# Patient Record
Sex: Female | Born: 1940 | Race: White | Hispanic: No | Smoking: Former smoker
Health system: Southern US, Community
[De-identification: ages and names within clinical notes are randomized; demographics above are authoritative.]

## PROBLEM LIST (undated history)

## (undated) DIAGNOSIS — F329 Major depressive disorder, single episode, unspecified: Secondary | ICD-10-CM

## (undated) DIAGNOSIS — K5790 Diverticulosis of intestine, part unspecified, without perforation or abscess without bleeding: Secondary | ICD-10-CM

## (undated) DIAGNOSIS — G4733 Obstructive sleep apnea (adult) (pediatric): Secondary | ICD-10-CM

## (undated) DIAGNOSIS — I4819 Other persistent atrial fibrillation: Secondary | ICD-10-CM

## (undated) DIAGNOSIS — R109 Unspecified abdominal pain: Secondary | ICD-10-CM

## (undated) DIAGNOSIS — K760 Fatty (change of) liver, not elsewhere classified: Secondary | ICD-10-CM

## (undated) DIAGNOSIS — I509 Heart failure, unspecified: Secondary | ICD-10-CM

## (undated) DIAGNOSIS — G8929 Other chronic pain: Secondary | ICD-10-CM

## (undated) DIAGNOSIS — G3184 Mild cognitive impairment, so stated: Secondary | ICD-10-CM

## (undated) DIAGNOSIS — K219 Gastro-esophageal reflux disease without esophagitis: Secondary | ICD-10-CM

## (undated) DIAGNOSIS — I4891 Unspecified atrial fibrillation: Secondary | ICD-10-CM

## (undated) DIAGNOSIS — M797 Fibromyalgia: Secondary | ICD-10-CM

## (undated) DIAGNOSIS — K648 Other hemorrhoids: Secondary | ICD-10-CM

## (undated) DIAGNOSIS — F32A Depression, unspecified: Secondary | ICD-10-CM

## (undated) DIAGNOSIS — K819 Cholecystitis, unspecified: Secondary | ICD-10-CM

## (undated) DIAGNOSIS — E8770 Fluid overload, unspecified: Secondary | ICD-10-CM

## (undated) DIAGNOSIS — M1990 Unspecified osteoarthritis, unspecified site: Secondary | ICD-10-CM

## (undated) DIAGNOSIS — E785 Hyperlipidemia, unspecified: Secondary | ICD-10-CM

## (undated) DIAGNOSIS — E039 Hypothyroidism, unspecified: Secondary | ICD-10-CM

## (undated) DIAGNOSIS — M109 Gout, unspecified: Secondary | ICD-10-CM

## (undated) DIAGNOSIS — K635 Polyp of colon: Secondary | ICD-10-CM

## (undated) DIAGNOSIS — F419 Anxiety disorder, unspecified: Secondary | ICD-10-CM

## (undated) HISTORY — DX: Depression, unspecified: F32.A

## (undated) HISTORY — DX: Polyp of colon: K63.5

## (undated) HISTORY — DX: Anxiety disorder, unspecified: F41.9

## (undated) HISTORY — DX: Unspecified osteoarthritis, unspecified site: M19.90

## (undated) HISTORY — DX: Hypothyroidism, unspecified: E03.9

## (undated) HISTORY — PX: ABDOMINAL HYSTERECTOMY: SHX81

## (undated) HISTORY — DX: Mild cognitive impairment, so stated: G31.84

## (undated) HISTORY — DX: Obstructive sleep apnea (adult) (pediatric): G47.33

## (undated) HISTORY — DX: Fibromyalgia: M79.7

## (undated) HISTORY — DX: Major depressive disorder, single episode, unspecified: F32.9

## (undated) HISTORY — DX: Other persistent atrial fibrillation: I48.19

## (undated) HISTORY — DX: Gastro-esophageal reflux disease without esophagitis: K21.9

## (undated) HISTORY — DX: Morbid (severe) obesity due to excess calories: E66.01

## (undated) HISTORY — DX: Hyperlipidemia, unspecified: E78.5

## (undated) HISTORY — PX: RHINOPLASTY: SUR1284

---

## 1980-11-26 HISTORY — PX: BLADDER SUSPENSION: SHX72

## 1993-11-26 HISTORY — PX: OTHER SURGICAL HISTORY: SHX169

## 2001-03-31 ENCOUNTER — Encounter: Payer: Self-pay | Admitting: Otolaryngology

## 2001-03-31 ENCOUNTER — Ambulatory Visit (HOSPITAL_COMMUNITY): Admission: RE | Admit: 2001-03-31 | Discharge: 2001-03-31 | Payer: Self-pay | Admitting: Otolaryngology

## 2003-04-06 ENCOUNTER — Encounter (HOSPITAL_COMMUNITY): Admission: RE | Admit: 2003-04-06 | Discharge: 2003-05-06 | Payer: Self-pay | Admitting: Internal Medicine

## 2003-05-04 ENCOUNTER — Encounter: Payer: Self-pay | Admitting: Internal Medicine

## 2003-05-04 ENCOUNTER — Ambulatory Visit (HOSPITAL_COMMUNITY): Admission: RE | Admit: 2003-05-04 | Discharge: 2003-05-04 | Payer: Self-pay | Admitting: Internal Medicine

## 2003-11-27 DIAGNOSIS — K5790 Diverticulosis of intestine, part unspecified, without perforation or abscess without bleeding: Secondary | ICD-10-CM

## 2003-11-27 HISTORY — PX: COLONOSCOPY: SHX174

## 2003-11-27 HISTORY — DX: Diverticulosis of intestine, part unspecified, without perforation or abscess without bleeding: K57.90

## 2004-02-11 ENCOUNTER — Emergency Department (HOSPITAL_COMMUNITY): Admission: EM | Admit: 2004-02-11 | Discharge: 2004-02-11 | Payer: Self-pay | Admitting: Emergency Medicine

## 2004-08-14 ENCOUNTER — Ambulatory Visit (HOSPITAL_COMMUNITY): Admission: RE | Admit: 2004-08-14 | Discharge: 2004-08-14 | Payer: Self-pay | Admitting: Family Medicine

## 2004-09-27 ENCOUNTER — Ambulatory Visit (HOSPITAL_COMMUNITY): Admission: RE | Admit: 2004-09-27 | Discharge: 2004-09-27 | Payer: Self-pay | Admitting: Nephrology

## 2004-09-28 ENCOUNTER — Ambulatory Visit (HOSPITAL_COMMUNITY): Admission: RE | Admit: 2004-09-28 | Discharge: 2004-09-28 | Payer: Self-pay | Admitting: General Surgery

## 2004-10-02 ENCOUNTER — Ambulatory Visit: Payer: Self-pay | Admitting: Cardiology

## 2004-10-02 ENCOUNTER — Ambulatory Visit (HOSPITAL_COMMUNITY): Admission: RE | Admit: 2004-10-02 | Discharge: 2004-10-02 | Payer: Self-pay | Admitting: Family Medicine

## 2004-11-03 ENCOUNTER — Ambulatory Visit: Payer: Self-pay | Admitting: Psychology

## 2005-02-19 ENCOUNTER — Ambulatory Visit (HOSPITAL_COMMUNITY): Admission: RE | Admit: 2005-02-19 | Discharge: 2005-02-19 | Payer: Self-pay | Admitting: Family Medicine

## 2005-09-20 ENCOUNTER — Emergency Department (HOSPITAL_COMMUNITY): Admission: EM | Admit: 2005-09-20 | Discharge: 2005-09-20 | Payer: Self-pay | Admitting: Emergency Medicine

## 2005-12-14 ENCOUNTER — Encounter (INDEPENDENT_AMBULATORY_CARE_PROVIDER_SITE_OTHER): Payer: Self-pay | Admitting: Internal Medicine

## 2005-12-14 LAB — CONVERTED CEMR LAB
HDL: 46 mg/dL
LDL Cholesterol: 138 mg/dL
Total CHOL/HDL Ratio: 4.7
Triglycerides: 163 mg/dL

## 2006-01-03 ENCOUNTER — Encounter (INDEPENDENT_AMBULATORY_CARE_PROVIDER_SITE_OTHER): Payer: Self-pay | Admitting: Internal Medicine

## 2006-09-02 ENCOUNTER — Ambulatory Visit: Payer: Self-pay | Admitting: Internal Medicine

## 2006-09-23 ENCOUNTER — Encounter (INDEPENDENT_AMBULATORY_CARE_PROVIDER_SITE_OTHER): Payer: Self-pay | Admitting: Internal Medicine

## 2006-09-23 ENCOUNTER — Ambulatory Visit (HOSPITAL_COMMUNITY): Admission: RE | Admit: 2006-09-23 | Discharge: 2006-09-23 | Payer: Self-pay | Admitting: Internal Medicine

## 2006-09-25 ENCOUNTER — Encounter (INDEPENDENT_AMBULATORY_CARE_PROVIDER_SITE_OTHER): Payer: Self-pay | Admitting: Internal Medicine

## 2006-10-02 ENCOUNTER — Encounter (INDEPENDENT_AMBULATORY_CARE_PROVIDER_SITE_OTHER): Payer: Self-pay | Admitting: Internal Medicine

## 2006-10-02 LAB — CONVERTED CEMR LAB
ALT: 22 units/L
Albumin: 3.3 g/dL
BUN: 11 mg/dL
Basophils Relative: 0 %
CO2: 25 meq/L
Calcium: 8.7 mg/dL
Creatinine, Ser: 0.7 mg/dL
Glucose, Bld: 171 mg/dL
Lymphocytes Relative: 8 %
MCV: 85.6 fL
Neutrophils Relative %: 91 %
RDW: 13.3 %
Sodium: 135 meq/L
Total Protein: 6.8 g/dL

## 2006-10-03 ENCOUNTER — Inpatient Hospital Stay (HOSPITAL_COMMUNITY): Admission: EM | Admit: 2006-10-03 | Discharge: 2006-10-05 | Payer: Self-pay | Admitting: Emergency Medicine

## 2006-10-29 ENCOUNTER — Ambulatory Visit: Payer: Self-pay | Admitting: Internal Medicine

## 2006-11-02 ENCOUNTER — Encounter (INDEPENDENT_AMBULATORY_CARE_PROVIDER_SITE_OTHER): Payer: Self-pay | Admitting: Internal Medicine

## 2006-11-12 ENCOUNTER — Ambulatory Visit: Payer: Self-pay | Admitting: Internal Medicine

## 2006-11-13 ENCOUNTER — Encounter (INDEPENDENT_AMBULATORY_CARE_PROVIDER_SITE_OTHER): Payer: Self-pay | Admitting: Internal Medicine

## 2006-11-30 ENCOUNTER — Encounter: Payer: Self-pay | Admitting: Internal Medicine

## 2006-11-30 DIAGNOSIS — G4733 Obstructive sleep apnea (adult) (pediatric): Secondary | ICD-10-CM

## 2006-11-30 DIAGNOSIS — M1990 Unspecified osteoarthritis, unspecified site: Secondary | ICD-10-CM | POA: Insufficient documentation

## 2006-11-30 DIAGNOSIS — F411 Generalized anxiety disorder: Secondary | ICD-10-CM | POA: Insufficient documentation

## 2006-11-30 DIAGNOSIS — K219 Gastro-esophageal reflux disease without esophagitis: Secondary | ICD-10-CM | POA: Insufficient documentation

## 2006-11-30 DIAGNOSIS — E039 Hypothyroidism, unspecified: Secondary | ICD-10-CM | POA: Insufficient documentation

## 2006-11-30 DIAGNOSIS — E785 Hyperlipidemia, unspecified: Secondary | ICD-10-CM | POA: Insufficient documentation

## 2006-11-30 DIAGNOSIS — J45909 Unspecified asthma, uncomplicated: Secondary | ICD-10-CM | POA: Insufficient documentation

## 2006-11-30 DIAGNOSIS — Z860100 Personal history of colon polyps, unspecified: Secondary | ICD-10-CM | POA: Insufficient documentation

## 2006-11-30 DIAGNOSIS — F329 Major depressive disorder, single episode, unspecified: Secondary | ICD-10-CM

## 2006-11-30 DIAGNOSIS — IMO0001 Reserved for inherently not codable concepts without codable children: Secondary | ICD-10-CM

## 2006-11-30 DIAGNOSIS — J309 Allergic rhinitis, unspecified: Secondary | ICD-10-CM | POA: Insufficient documentation

## 2006-11-30 DIAGNOSIS — G43009 Migraine without aura, not intractable, without status migrainosus: Secondary | ICD-10-CM

## 2006-11-30 DIAGNOSIS — R002 Palpitations: Secondary | ICD-10-CM

## 2006-11-30 DIAGNOSIS — G47411 Narcolepsy with cataplexy: Secondary | ICD-10-CM | POA: Insufficient documentation

## 2006-12-31 ENCOUNTER — Encounter (INDEPENDENT_AMBULATORY_CARE_PROVIDER_SITE_OTHER): Payer: Self-pay | Admitting: Internal Medicine

## 2007-01-15 ENCOUNTER — Encounter (INDEPENDENT_AMBULATORY_CARE_PROVIDER_SITE_OTHER): Payer: Self-pay | Admitting: Internal Medicine

## 2007-03-10 ENCOUNTER — Telehealth (INDEPENDENT_AMBULATORY_CARE_PROVIDER_SITE_OTHER): Payer: Self-pay | Admitting: *Deleted

## 2007-03-18 ENCOUNTER — Encounter (INDEPENDENT_AMBULATORY_CARE_PROVIDER_SITE_OTHER): Payer: Self-pay | Admitting: Internal Medicine

## 2007-03-19 ENCOUNTER — Ambulatory Visit: Payer: Self-pay | Admitting: Internal Medicine

## 2007-03-20 LAB — CONVERTED CEMR LAB
BUN: 14 mg/dL (ref 6–23)
CO2: 24 meq/L (ref 19–32)
Calcium: 9.2 mg/dL (ref 8.4–10.5)
Chloride: 104 meq/L (ref 96–112)
Cholesterol: 222 mg/dL — ABNORMAL HIGH (ref 0–200)
HDL: 45 mg/dL (ref >39)
Potassium: 4.4 meq/L (ref 3.5–5.3)
Sodium: 142 meq/L (ref 135–145)
TSH: 4.462 microintl units/mL (ref 0.350–5.50)
Triglycerides: 139 mg/dL (ref <150)
VLDL: 28 mg/dL (ref 0–40)

## 2007-03-21 ENCOUNTER — Telehealth (INDEPENDENT_AMBULATORY_CARE_PROVIDER_SITE_OTHER): Payer: Self-pay | Admitting: Internal Medicine

## 2007-05-23 ENCOUNTER — Encounter (INDEPENDENT_AMBULATORY_CARE_PROVIDER_SITE_OTHER): Payer: Self-pay | Admitting: Internal Medicine

## 2007-08-18 ENCOUNTER — Encounter (INDEPENDENT_AMBULATORY_CARE_PROVIDER_SITE_OTHER): Payer: Self-pay | Admitting: Internal Medicine

## 2007-08-26 ENCOUNTER — Ambulatory Visit: Payer: Self-pay | Admitting: Internal Medicine

## 2007-08-27 ENCOUNTER — Encounter (INDEPENDENT_AMBULATORY_CARE_PROVIDER_SITE_OTHER): Payer: Self-pay | Admitting: Internal Medicine

## 2007-08-27 LAB — CONVERTED CEMR LAB
Chloride: 104 meq/L (ref 96–112)
Cholesterol: 215 mg/dL — ABNORMAL HIGH (ref 0–200)
Creatinine, Ser: 0.69 mg/dL (ref 0.40–1.20)
Glucose, Bld: 97 mg/dL (ref 70–99)
Sodium: 140 meq/L (ref 135–145)

## 2007-08-28 ENCOUNTER — Ambulatory Visit (HOSPITAL_COMMUNITY): Admission: RE | Admit: 2007-08-28 | Discharge: 2007-08-28 | Payer: Self-pay | Admitting: *Deleted

## 2007-09-01 ENCOUNTER — Telehealth (INDEPENDENT_AMBULATORY_CARE_PROVIDER_SITE_OTHER): Payer: Self-pay | Admitting: *Deleted

## 2007-09-01 ENCOUNTER — Encounter (INDEPENDENT_AMBULATORY_CARE_PROVIDER_SITE_OTHER): Payer: Self-pay | Admitting: Internal Medicine

## 2007-09-09 ENCOUNTER — Encounter (INDEPENDENT_AMBULATORY_CARE_PROVIDER_SITE_OTHER): Payer: Self-pay | Admitting: Internal Medicine

## 2007-09-25 ENCOUNTER — Encounter (INDEPENDENT_AMBULATORY_CARE_PROVIDER_SITE_OTHER): Payer: Self-pay | Admitting: Internal Medicine

## 2007-09-30 ENCOUNTER — Encounter (INDEPENDENT_AMBULATORY_CARE_PROVIDER_SITE_OTHER): Payer: Self-pay | Admitting: Internal Medicine

## 2007-09-30 ENCOUNTER — Ambulatory Visit (HOSPITAL_COMMUNITY): Admission: RE | Admit: 2007-09-30 | Discharge: 2007-09-30 | Payer: Self-pay | Admitting: Internal Medicine

## 2007-10-01 ENCOUNTER — Ambulatory Visit (HOSPITAL_COMMUNITY): Admission: RE | Admit: 2007-10-01 | Discharge: 2007-10-01 | Payer: Self-pay | Admitting: Internal Medicine

## 2008-01-08 ENCOUNTER — Encounter (INDEPENDENT_AMBULATORY_CARE_PROVIDER_SITE_OTHER): Payer: Self-pay | Admitting: Internal Medicine

## 2008-01-08 LAB — CONVERTED CEMR LAB
AST: 19 units/L
Albumin: 4.1 g/dL
Alkaline Phosphatase: 87 units/L
CO2: 25 meq/L
Chloride: 105 meq/L
Cholesterol: 171 mg/dL
Glucose, Bld: 101 mg/dL
HDL: 48 mg/dL
LDL Cholesterol: 88 mg/dL
Potassium: 4.6 meq/L
Sodium: 143 meq/L
Total Bilirubin: 0.4 mg/dL
Triglycerides: 173 mg/dL

## 2008-02-16 ENCOUNTER — Ambulatory Visit: Payer: Self-pay | Admitting: Internal Medicine

## 2008-05-13 ENCOUNTER — Encounter (INDEPENDENT_AMBULATORY_CARE_PROVIDER_SITE_OTHER): Payer: Self-pay | Admitting: Internal Medicine

## 2008-05-20 ENCOUNTER — Telehealth (INDEPENDENT_AMBULATORY_CARE_PROVIDER_SITE_OTHER): Payer: Self-pay | Admitting: Internal Medicine

## 2008-05-21 ENCOUNTER — Ambulatory Visit: Payer: Self-pay | Admitting: Internal Medicine

## 2008-05-24 ENCOUNTER — Encounter (INDEPENDENT_AMBULATORY_CARE_PROVIDER_SITE_OTHER): Payer: Self-pay | Admitting: Internal Medicine

## 2008-06-03 ENCOUNTER — Encounter (INDEPENDENT_AMBULATORY_CARE_PROVIDER_SITE_OTHER): Payer: Self-pay | Admitting: Internal Medicine

## 2008-06-14 ENCOUNTER — Encounter (INDEPENDENT_AMBULATORY_CARE_PROVIDER_SITE_OTHER): Payer: Self-pay | Admitting: Internal Medicine

## 2008-06-23 ENCOUNTER — Encounter (INDEPENDENT_AMBULATORY_CARE_PROVIDER_SITE_OTHER): Payer: Self-pay | Admitting: Internal Medicine

## 2008-08-11 ENCOUNTER — Ambulatory Visit: Payer: Self-pay | Admitting: Internal Medicine

## 2008-08-12 LAB — CONVERTED CEMR LAB
Albumin: 4 g/dL (ref 3.5–5.2)
BUN: 17 mg/dL (ref 6–23)
Basophils Absolute: 0 10*3/uL (ref 0.0–0.1)
Calcium: 8.9 mg/dL (ref 8.4–10.5)
Chloride: 104 meq/L (ref 96–112)
Eosinophils Absolute: 0.5 10*3/uL (ref 0.0–0.7)
Eosinophils Relative: 7 % — ABNORMAL HIGH (ref 0–5)
HCT: 40.1 % (ref 36.0–46.0)
HDL: 50 mg/dL (ref >39)
Lymphocytes Relative: 22 % (ref 12–46)
Lymphs Abs: 1.5 10*3/uL (ref 0.7–4.0)
Monocytes Absolute: 0.6 10*3/uL (ref 0.1–1.0)
Monocytes Relative: 9 % (ref 3–12)
Neutro Abs: 4.3 10*3/uL (ref 1.7–7.7)
Neutrophils Relative %: 62 % (ref 43–77)
Potassium: 4.5 meq/L (ref 3.5–5.3)
RBC: 4.31 M/uL (ref 3.87–5.11)
Sodium: 140 meq/L (ref 135–145)
Total CHOL/HDL Ratio: 2.9
Total Protein: 6.9 g/dL (ref 6.0–8.3)

## 2008-08-16 ENCOUNTER — Emergency Department (HOSPITAL_COMMUNITY): Admission: EM | Admit: 2008-08-16 | Discharge: 2008-08-16 | Payer: Self-pay | Admitting: Emergency Medicine

## 2008-08-23 ENCOUNTER — Ambulatory Visit: Admission: RE | Admit: 2008-08-23 | Discharge: 2008-08-23 | Payer: Self-pay | Admitting: Internal Medicine

## 2008-08-24 ENCOUNTER — Encounter (INDEPENDENT_AMBULATORY_CARE_PROVIDER_SITE_OTHER): Payer: Self-pay | Admitting: Internal Medicine

## 2008-08-24 ENCOUNTER — Ambulatory Visit: Payer: Self-pay | Admitting: Pulmonary Disease

## 2008-09-09 ENCOUNTER — Ambulatory Visit: Payer: Self-pay | Admitting: Internal Medicine

## 2008-09-24 ENCOUNTER — Encounter (INDEPENDENT_AMBULATORY_CARE_PROVIDER_SITE_OTHER): Payer: Self-pay | Admitting: Internal Medicine

## 2008-10-07 ENCOUNTER — Encounter (INDEPENDENT_AMBULATORY_CARE_PROVIDER_SITE_OTHER): Payer: Self-pay | Admitting: Internal Medicine

## 2008-10-08 ENCOUNTER — Encounter (INDEPENDENT_AMBULATORY_CARE_PROVIDER_SITE_OTHER): Payer: Self-pay | Admitting: Internal Medicine

## 2009-01-10 ENCOUNTER — Ambulatory Visit: Payer: Self-pay | Admitting: Internal Medicine

## 2009-01-10 DIAGNOSIS — I8310 Varicose veins of unspecified lower extremity with inflammation: Secondary | ICD-10-CM

## 2009-01-10 LAB — CONVERTED CEMR LAB
Amphetamine Screen, Ur: NEGATIVE
Ethyl Alcohol: <10 mg/dL (ref <10)
Methadone: NEGATIVE
Opiate Screen, Urine: NEGATIVE

## 2009-02-12 ENCOUNTER — Encounter (INDEPENDENT_AMBULATORY_CARE_PROVIDER_SITE_OTHER): Payer: Self-pay | Admitting: Internal Medicine

## 2009-02-16 ENCOUNTER — Encounter (INDEPENDENT_AMBULATORY_CARE_PROVIDER_SITE_OTHER): Payer: Self-pay | Admitting: Internal Medicine

## 2009-03-03 ENCOUNTER — Encounter (INDEPENDENT_AMBULATORY_CARE_PROVIDER_SITE_OTHER): Payer: Self-pay | Admitting: Internal Medicine

## 2009-03-04 LAB — CONVERTED CEMR LAB
AST: 21 units/L (ref 0–37)
Alkaline Phosphatase: 95 units/L (ref 39–117)
CO2: 26 meq/L (ref 19–32)
Cholesterol: 160 mg/dL (ref 0–200)
Free T4: 0.7 ng/dL — ABNORMAL LOW (ref 0.80–1.80)
Glucose, Bld: 106 mg/dL — ABNORMAL HIGH (ref 70–99)
LDL Cholesterol: 79 mg/dL (ref 0–99)
Potassium: 4.6 meq/L (ref 3.5–5.3)
Sodium: 142 meq/L (ref 135–145)
TSH: 5.288 microintl units/mL — ABNORMAL HIGH (ref 0.350–4.500)
Total Bilirubin: 0.4 mg/dL (ref 0.3–1.2)
Total CHOL/HDL Ratio: 3.3
Triglycerides: 167 mg/dL — ABNORMAL HIGH (ref <150)

## 2009-03-10 ENCOUNTER — Encounter (INDEPENDENT_AMBULATORY_CARE_PROVIDER_SITE_OTHER): Payer: Self-pay | Admitting: Internal Medicine

## 2009-04-18 ENCOUNTER — Telehealth (INDEPENDENT_AMBULATORY_CARE_PROVIDER_SITE_OTHER): Payer: Self-pay | Admitting: *Deleted

## 2009-04-20 ENCOUNTER — Ambulatory Visit: Payer: Self-pay | Admitting: Internal Medicine

## 2009-04-22 ENCOUNTER — Encounter (INDEPENDENT_AMBULATORY_CARE_PROVIDER_SITE_OTHER): Payer: Self-pay | Admitting: Internal Medicine

## 2009-04-26 ENCOUNTER — Encounter (INDEPENDENT_AMBULATORY_CARE_PROVIDER_SITE_OTHER): Payer: Self-pay | Admitting: *Deleted

## 2009-04-28 ENCOUNTER — Encounter (INDEPENDENT_AMBULATORY_CARE_PROVIDER_SITE_OTHER): Payer: Self-pay | Admitting: Internal Medicine

## 2009-05-10 ENCOUNTER — Encounter (INDEPENDENT_AMBULATORY_CARE_PROVIDER_SITE_OTHER): Payer: Self-pay | Admitting: Internal Medicine

## 2009-06-03 ENCOUNTER — Ambulatory Visit: Payer: Self-pay | Admitting: Internal Medicine

## 2009-06-03 DIAGNOSIS — L538 Other specified erythematous conditions: Secondary | ICD-10-CM

## 2009-07-21 ENCOUNTER — Ambulatory Visit: Payer: Self-pay | Admitting: Internal Medicine

## 2009-07-22 ENCOUNTER — Encounter (INDEPENDENT_AMBULATORY_CARE_PROVIDER_SITE_OTHER): Payer: Self-pay | Admitting: Internal Medicine

## 2009-07-22 LAB — CONVERTED CEMR LAB
ALT: 25 units/L (ref 0–35)
Alkaline Phosphatase: 90 units/L (ref 39–117)
Calcium: 8.4 mg/dL (ref 8.4–10.5)
Chloride: 108 meq/L (ref 96–112)
Creatinine, Ser: 0.64 mg/dL (ref 0.40–1.20)
Glucose, Bld: 102 mg/dL — ABNORMAL HIGH (ref 70–99)
HDL: 48 mg/dL (ref >39)
LDL Cholesterol: 96 mg/dL (ref 0–99)
Potassium: 4.4 meq/L (ref 3.5–5.3)
Total CHOL/HDL Ratio: 3.5
Total Protein: 6.6 g/dL (ref 6.0–8.3)
Triglycerides: 129 mg/dL (ref <150)
VLDL: 26 mg/dL (ref 0–40)

## 2010-03-02 ENCOUNTER — Emergency Department (HOSPITAL_COMMUNITY): Admission: EM | Admit: 2010-03-02 | Discharge: 2010-03-02 | Payer: Self-pay | Admitting: Emergency Medicine

## 2010-03-14 ENCOUNTER — Ambulatory Visit (HOSPITAL_COMMUNITY): Admission: RE | Admit: 2010-03-14 | Discharge: 2010-03-14 | Payer: Self-pay | Admitting: Family Medicine

## 2010-03-30 ENCOUNTER — Ambulatory Visit: Payer: Self-pay | Admitting: Gastroenterology

## 2010-04-26 ENCOUNTER — Ambulatory Visit (HOSPITAL_COMMUNITY): Admission: RE | Admit: 2010-04-26 | Discharge: 2010-04-26 | Payer: Self-pay | Admitting: Gastroenterology

## 2010-04-26 ENCOUNTER — Ambulatory Visit: Payer: Self-pay | Admitting: Gastroenterology

## 2010-04-26 HISTORY — PX: COLONOSCOPY: SHX174

## 2010-09-07 ENCOUNTER — Encounter
Admission: RE | Admit: 2010-09-07 | Discharge: 2010-12-06 | Payer: Self-pay | Source: Home / Self Care | Attending: Family Medicine | Admitting: Family Medicine

## 2010-11-06 ENCOUNTER — Ambulatory Visit: Payer: Self-pay | Admitting: Gastroenterology

## 2010-11-06 DIAGNOSIS — R5383 Other fatigue: Secondary | ICD-10-CM

## 2010-11-06 DIAGNOSIS — R109 Unspecified abdominal pain: Secondary | ICD-10-CM

## 2010-11-06 DIAGNOSIS — R5381 Other malaise: Secondary | ICD-10-CM

## 2010-11-06 DIAGNOSIS — K648 Other hemorrhoids: Secondary | ICD-10-CM | POA: Insufficient documentation

## 2010-11-08 LAB — CONVERTED CEMR LAB
Eosinophils Absolute: 0.5 10*3/uL (ref 0.0–0.7)
HCT: 42.5 % (ref 36.0–46.0)
Lymphocytes Relative: 19 % (ref 12–46)
MCV: 90.8 fL (ref 78.0–100.0)
Monocytes Relative: 8 % (ref 3–12)
Neutrophils Relative %: 66 % (ref 43–77)
TSH: 2.305 microintl units/mL (ref 0.350–4.500)
WBC: 7.8 10*3/uL (ref 4.0–10.5)

## 2010-12-12 ENCOUNTER — Ambulatory Visit (HOSPITAL_COMMUNITY)
Admission: RE | Admit: 2010-12-12 | Discharge: 2010-12-12 | Payer: Self-pay | Source: Home / Self Care | Attending: Family Medicine | Admitting: Family Medicine

## 2010-12-26 NOTE — Assessment & Plan Note (Signed)
Summary: PERSONAL HISTORY OF POLYPS   Visit Type:  Initial Consult Primary Care Provider:  Loleta Chance, M.D.  Chief Complaint:  needs tcs.  History of Present Illness: Has rare rectal bleeding from hemorrhoids. No nausea, vomiting, heartburn, indigestion, problems swallowing, abd pain, diarrhea, or constipation. BM: 2-3 times a day. Pain during the last TCS-"tremendous." Took some pills for a prep. No heart or kidney failure. No nausea or vomiting with prep. Mother had colon CA at age 71.  Current Medications (verified): 1)  Innopran Xl 80 Mg Cp24 (Propranolol Hcl Sr Beads) .Marland Kitchen.. 1 By Mouth Once Daily 2)  Ativan 1 Mg Tabs (Lorazepam) .... 1.5 Tab Three Times A Day 3)  Prozac 20 Mg Caps (Fluoxetine Hcl) .... 3 By Mouth Once Daily 4)  Nexium 40 Mg Cpdr (Esomeprazole Magnesium) .... Once Daily 5)  Aspirin 81 Mg Tbec (Aspirin) .... Once Daily 6)  Duoneb  Soln (Albuterol-Ipratropium Soln) .... Three Times A Day As Needed 7)  Provigil 200 Mg Tabs (Modafinil) .Marland Kitchen.. 1 By Mouth Once Daily As Needed 8)  Albuterol 90 Mcg/act Aers (Albuterol) .... As Needed 9)  Singulair 10 Mg Tabs (Montelukast Sodium) .Marland Kitchen.. 1 By Mouth Once Daily 10)  Simvastatin 40 Mg  Tabs (Simvastatin) .Marland Kitchen.. 1 By Mouth Once Daily 11)  Fluticasone Propionate 50 Mcg/act  Susp (Fluticasone Propionate) .... 2 Sprays in Each Nostril Once Daily As Needed 12)  Synthroid 50 Mcg Tabs (Levothyroxine Sodium) .... Take One Tab Once Daily 13)  Mucinex 600 Mg Xr12h-Tab (Guaifenesin) .... Two Times A Day  Allergies (verified): 1)  ! Demerol 2)  ! Lipitor  Past History:  Past Medical History: 2005: TCS-diverticulosis [facility]-->TC, Dr. Katrinka Blazing Allergic rhinitis Anxiety Asthma Colonic polyps, recurrent Depression GERD Hyperlipidemia Hypothyroidism Osteoarthritis palpitations narcolepsy fibromyalgia OSA on CPAP and O2 morbid obesity migraines  Past Surgical History: Reviewed history from 03/18/2007 and no changes required. BLADDER  SUSPENSION-1982 KIDNEY [phone] RHINOPLASTY- hysterectomy secondary to fibroids-1982  Social History: Married lives with spouse  Former Smoker-1/2ppd x30 years-quit 1991 Alcohol use-yes-0-2 drinks, 2-3 times a week Drug use-no 2 daughters, youngest-43   Review of Systems       APR 2011: HB 14.1 PLT 291 CR 0.75 NL HFP TSH 2.947 MAR 2011: 306 LBS  Per HPI otherwise all systems negative.  Vital Signs:  Patient profile:   70 year old female Height:      62 inches Weight:      304 pounds BMI:     55.80 Temp:     97.5 degrees F oral Pulse rate:   60 / minute BP sitting:   124 / 88  (right arm) Cuff size:   large  Vitals Entered By: Hendricks Limes LPN (Mar 30, 3234 3:04 PM)  Physical Exam  General:  Well developed, well nourished, no acute distress. Head:  Normocephalic and atraumatic. Eyes:  PERRLA, no icterus. Mouth:  No deformity or lesions. Neck:  Supple; no masses. Lungs:  Clear throughout to auscultation. Heart:  Regular rate and rhythm; no murmurs. Abdomen:  Soft, nontender and nondistended. Normal bowel sounds. obese.   Extremities:  No edema or deformities noted. Neurologic:  Alert and  oriented x4;  grossly normal neurologically.  Impression & Recommendations:  Problem # 1:  COLONIC POLYPS, HX OF (ICD-V12.72) Assessment Unchanged  TCS w/ propofol 2o to pt's comorbidities, high dose of Prozac, and pt failed conscious sedation on last colonoscopy. Pt stilL had "tremednous pain" in spite of Versed 12 mg iv, Morphine 20 mg iv and  Phenergan 25 mg iv. OPV prn.   CC: PCP  Orders: New Patient Level III (16109)

## 2010-12-26 NOTE — Letter (Signed)
Summary: TCS ORDER  TCS ORDER   Imported By: Ave Filter 03/30/2010 15:47:46  _____________________________________________________________________  External Attachment:    Type:   Image     Comment:   External Document

## 2010-12-28 NOTE — Assessment & Plan Note (Addendum)
Summary: 6 MONTH F/U S/P TCS HAVING PROBLEMS/BLEEDING/WEAK/LAW   Visit Type:  Follow-up Visit Primary Care Chrisotpher Rivero:  Loleta Chance, M.D.  CC:  f/u and having problems with blood in stool/weak.  History of Present Illness: Ms. Johanning is a pleasant 70 year old female who presents today with concerns of possible blood in stool. She actually had a colonoscopy done June 2011 secondary to rectal bleeding: showed internal hemorrhoids, ascending colon polyp, transverse colon polyps. She states she notices intermittent blood "around stool" X 4 mos. +rectal itching, no pain. no blood on tissue. c/o feeling fatigued/weak X 3 mos. Has BM 1-3X per day. denies constipatino. +chronic back pain, c/o RLQ "pinching" pain with movement. She is up 7 lbs from last visit.    04/27/10: TCS:  Rectal bleeding secondary to internal hemorrhoids.2. Ascending colon polyp removed via snare cautery and status post 3  clips placed.3. Transverse colon polyps.  Current Medications (verified): 1)  Innopran Xl 80 Mg Cp24 (Propranolol Hcl Sr Beads) .Marland Kitchen.. 1 By Mouth Once Daily 2)  Ativan 1 Mg Tabs (Lorazepam) .... 1.5 Tab Three Times A Day 3)  Prozac 20 Mg Caps (Fluoxetine Hcl) .... 3 By Mouth Once Daily 4)  Nexium 40 Mg Cpdr (Esomeprazole Magnesium) .... Once Daily 5)  Aspirin 81 Mg Tbec (Aspirin) .... Once Daily 6)  Duoneb  Soln (Albuterol-Ipratropium Soln) .... Three Times A Day As Needed 7)  Provigil 200 Mg Tabs (Modafinil) .Marland Kitchen.. 1 By Mouth Once Daily As Needed 8)  Albuterol 90 Mcg/act Aers (Albuterol) .... As Needed 9)  Singulair 10 Mg Tabs (Montelukast Sodium) .Marland Kitchen.. 1 By Mouth Once Daily 10)  Simvastatin 40 Mg  Tabs (Simvastatin) .Marland Kitchen.. 1 By Mouth Once Daily 11)  Fluticasone Propionate 50 Mcg/act  Susp (Fluticasone Propionate) .... 2 Sprays in Each Nostril Once Daily As Needed 12)  Synthroid 50 Mcg Tabs (Levothyroxine Sodium) .... Take One Tab Once Daily  Allergies (verified): 1)  ! Demerol 2)  ! Lipitor  Past History:  Past  Medical History: 2005: TCS-diverticulosis [facility]-->TC, Dr. Katrinka Blazing 04/27/10: TCS:  Rectal bleeding secondary to internal hemorrhoids.2. Ascending colon polyp removed via snare cautery and status post 3  clips placed.3. Transverse colon polyps. Allergic rhinitis Anxiety Asthma Colonic polyps, recurrent Depression GERD Hyperlipidemia Hypothyroidism Osteoarthritis palpitations narcolepsy fibromyalgia OSA on CPAP and O2 morbid obesity migraines  Review of Systems General:  Denies fever, chills, and anorexia. Eyes:  Denies blurring, irritation, and discharge. ENT:  Denies sore throat, hoarseness, and difficulty swallowing. CV:  Denies chest pains and syncope. Resp:  Denies dyspnea at rest and wheezing. GI:  Complains of bloody BM's; denies difficulty swallowing, nausea, and constipation; c/o "pinching" RLQ with movement. GU:  Denies urinary burning and urinary frequency. MS:  Denies joint pain / LOM and joint stiffness; c/o back pain. Derm:  Denies rash, itching, and dry skin. Neuro:  Denies weakness and syncope. Psych:  Denies depression and anxiety.  Vital Signs:  Patient profile:   70 year old female Height:      62 inches Weight:      311 pounds BMI:     57.09 Temp:     97.7 degrees F oral Pulse rate:   64 / minute BP sitting:   100 / 70  (left arm) Cuff size:   large  Vitals Entered By: Cloria Spring LPN (November 06, 2010 1:43 PM)  Physical Exam  General:  Well developed, well nourished, no acute distress.obese.   Lungs:  Clear throughout to auscultation. Heart:  Regular rate and  rhythm; no murmurs, rubs,  or bruits. Abdomen:  obese.  +BS, large AP diameter, no hepatosplenomegaly noted. difficult to appreciate secondary to large body habitus Msk:  Symmetrical with no gross deformities. Normal posture. Neurologic:  Alert and  oriented x4;  grossly normal neurologically. Psych:  Alert and cooperative. Normal mood and affect.  Impression & Recommendations:  Problem # 1:   INTERNAL HEMORRHOIDS (ICD-57.26) 70 year old female with reports of intermittent small amount of blood in stool with BM. No blood on tissue. Last TCS in June 2011, +internal hemorrhoids. Likely r/t known internal hemorrhoids.   Anusol suppositories, Disp# 20, X 10 days.   Orders: T-TSH ([id number]) Est. Patient Level II (86578)  Problem # 2:  ABDOMINAL PAIN (ICD-789.00) reports RLQ "pinching" with movement. Doubt GI related. No other associated factors, denies constipation, n/v. no findings on physical exam to raise concerns. ?functional abd pain. Does have hx of chronic back pain. Instructed to f/u with PCP, if worsens or associated with other factors such as n/v, change in bowel habits, will contact our office.   Orders: T-TSH ([id number]) Est. Patient Level II (13244)  Problem # 3:  FATIGUE (ICD-780.79)  c/o fatigue X 3 mos. up 7 lbs since last visit. +obesity. Discussed lifestyle modifications.  Will check TSH, CBC Lifestyle changes return in 6 mos   Orders: Est. Patient Level II (01027)  Other Orders: T-CBC w/Diff ([id number]) Prescriptions: ANUSOL-HC 25 MG SUPP (HYDROCORTISONE ACETATE) 1 per rectum twice daily  #20 x 0   Entered and Authorized by:   Gerrit Halls NP   Signed by:   Gerrit Halls NP on 11/06/2010   Method used:   [facility] to ...       [facility]       [address]       [place], [place]  [address]       Ph: [phone]       Fax: [phone]   RxID:   [id number]

## 2011-02-08 ENCOUNTER — Other Ambulatory Visit: Payer: Self-pay | Admitting: Family Medicine

## 2011-02-08 DIAGNOSIS — R102 Pelvic and perineal pain: Secondary | ICD-10-CM

## 2011-02-12 LAB — BASIC METABOLIC PANEL
BUN: 12 mg/dL (ref 6–23)
Calcium: 9 mg/dL (ref 8.4–10.5)
GFR calc Af Amer: >60 mL/min (ref >60)
GFR calc non Af Amer: >60 mL/min (ref >60)

## 2011-02-12 LAB — HEMOGLOBIN AND HEMATOCRIT, BLOOD: Hemoglobin: 12.4 g/dL (ref 12.0–15.0)

## 2011-02-14 ENCOUNTER — Ambulatory Visit
Admission: RE | Admit: 2011-02-14 | Discharge: 2011-02-14 | Disposition: A | Discharge status: Home / Self Care | Payer: Medicare Other | Source: Ambulatory Visit | Attending: Family Medicine | Admitting: Family Medicine

## 2011-02-14 DIAGNOSIS — R102 Pelvic and perineal pain: Secondary | ICD-10-CM

## 2011-02-14 LAB — DIFFERENTIAL
Basophils Relative: 0 % (ref 0–1)
Eosinophils Absolute: 0.3 10*3/uL (ref 0.0–0.7)
Eosinophils Relative: 7 % — ABNORMAL HIGH (ref 0–5)
Monocytes Absolute: 0.6 10*3/uL (ref 0.1–1.0)
Monocytes Relative: 15 % — ABNORMAL HIGH (ref 3–12)
Neutro Abs: 2.6 10*3/uL (ref 1.7–7.7)

## 2011-02-14 LAB — POCT CARDIAC MARKERS
CKMB, poc: 1.1 ng/mL (ref 1.0–8.0)
Myoglobin, poc: 67.5 ng/mL (ref 12–200)

## 2011-02-14 LAB — CBC
MCHC: 34.3 g/dL (ref 30.0–36.0)
MCV: 86.8 fL (ref 78.0–100.0)
RBC: 4.35 MIL/uL (ref 3.87–5.11)
RDW: 12.7 % (ref 11.5–15.5)
WBC: 4.1 10*3/uL (ref 4.0–10.5)

## 2011-02-14 LAB — BASIC METABOLIC PANEL
BUN: 10 mg/dL (ref 6–23)
CO2: 28 mEq/L (ref 19–32)
GFR calc Af Amer: >60 mL/min (ref >60)
GFR calc non Af Amer: >60 mL/min (ref >60)
Glucose, Bld: 141 mg/dL — ABNORMAL HIGH (ref 70–99)
Potassium: 3.7 mEq/L (ref 3.5–5.1)
Sodium: 138 mEq/L (ref 135–145)

## 2011-02-14 MED ORDER — GADOBENATE DIMEGLUMINE 529 MG/ML IV SOLN
20.0000 mL | Freq: Once | INTRAVENOUS | Status: AC | PRN
  Administered 2011-02-14: 20 mL via INTRAVENOUS

## 2011-03-07 ENCOUNTER — Encounter (INDEPENDENT_AMBULATORY_CARE_PROVIDER_SITE_OTHER): Payer: Medicare Other | Admitting: Gastroenterology

## 2011-03-07 ENCOUNTER — Encounter: Payer: Self-pay | Admitting: Gastroenterology

## 2011-03-07 DIAGNOSIS — R1011 Right upper quadrant pain: Secondary | ICD-10-CM

## 2011-03-07 NOTE — Assessment & Plan Note (Addendum)
Differential diagnosis referred pain from back, and less likely genitourinary in nature. No S/Sx pain 2o to GI tract. GI w/u unremarkable except for fatty liver disease. Consider Urology appt, or imaging of thoracic and lumbar spine. Recheck HFP. Will call pt with results. OPV in 4 mos.   TIME SPENT TAKING h&p AND DISCUSSING PLAN: 30 MINUTES

## 2011-03-07 NOTE — Progress Notes (Signed)
simple adenomas and hyperplastic polyps removed. TCS Pt c/o pain beginning in RUQ and spreading to R side and under left rib cage. Not associated with rectal bleeding, melena, fever, chills, nausea, vomiting, or change in bowel habits. Gained 16 lbs since May 2011. Appetite good, but eats "very little." Sx better with diuretics, ?torsemide. Pain worse with fluid retention and movement. Pt has chronic back pain.  Appt made for ? Elevated liver enzymes. Pt last HFP APR 2011-WNLs. Had labs CBC nl and Lipase nl. Had U/S and MRI of ABD-no ascites, fatty liver.  Past Medical History  Diagnosis Date  . GERD (gastroesophageal reflux disease)   . Fibromyalgia   . Anxiety   . Depression   . Obesity, morbid (more than 100 lbs over ideal weight or BMI > 40)   . Hyperlipemia   . Hypothyroidism   . Asthma   . Obstructive sleep apnea on CPAP and O2  . Migraine   . Colon polyp     TCS 2005 TICS [place], TCS/PROPOFOL 2011 SIMPLE ADENOMAS   PE: GENERAL-NAD, A&OX4, HEENT-PERRL, NO ORAL LESIONS, NECK: SUPPLE, FROM, LUNGS: CTA BILATERALLY, CV: REG RHYTHM, NO MURMUR, ABD: BS PRESENT, NONTENDER/NONDISTENDED, EXT: NO EDEMA, NEURO: NO FOCAL DEFICITS

## 2011-03-07 NOTE — Progress Notes (Signed)
Pt is aware of OV on 07/04/11 @ 3pm with SF. Pt is a E30

## 2011-03-08 LAB — HEPATIC FUNCTION PANEL
ALT: 22 U/L (ref 0–35)
AST: 23 U/L (ref 0–37)
Bilirubin, Direct: <0.1 mg/dL (ref 0.0–0.3)
Total Protein: 6.5 g/dL (ref 6.0–8.3)

## 2011-03-20 ENCOUNTER — Encounter: Payer: Medicare Other | Attending: Family Medicine | Admitting: *Deleted

## 2011-03-20 DIAGNOSIS — Z713 Dietary counseling and surveillance: Secondary | ICD-10-CM | POA: Insufficient documentation

## 2011-04-13 NOTE — Discharge Summary (Signed)
NAMECASSANDR, CEDERBERG                   ACCOUNT NO.:  [id number]   MEDICAL RECORD NO.:  [id number]          PATIENT TYPE:  INP   LOCATION:  A330                          FACILITY:  [facility]   PHYSICIAN:  Charlynne Pander, MD    DATE OF BIRTH:  1941/10/19   DATE OF ADMISSION:  10/01/2006  DATE OF DISCHARGE:  11/10/2007LH                               DISCHARGE SUMMARY   DISCHARGE DIAGNOSES:  1. Exacerbation of chronic obstructive pulmonary disease.  2. Acute bronchitis.  3. Dyslipidemia.  4. Hypothyroidism.  5. Depression.  6. Obstructive sleep apnea.  7. Hypertension.   HOSPITAL STAY:  Patient is a 70 year old white female with a past  medical history of COPD, who presented to emergency room with  progressive dyspnea and a cough.  Her initial evaluation was consistent  with COPD exacerbation, and she underwent treatment with continuous  positive airway pressure ventilation, supplemental oxygen, systemic  steroids, steroid inhaler, and DuoNeb's, gradually improving.  Her acute  bronchitis was addressed with Levaquin, and she completed a full course  of treatment prior to discharge.  She was stable for discharge and will  continue as an outpatient with followup with primary care physician in  one week with a tapering dose of steroids and increased frequency of  nebulizer treatments initially and then on an as needed basis.  The  patient did not require oxygen as her O2 SATs were 97% at discharge.  The patient will have ambulatory O2 SAT checked prior to her discharge  to assure that she does not require supplemental oxygen with exertion.  The patient was euthyroid clinically throughout the hospitalization and  normotensive.  There was no evidence of exacerbation of her depression,  and her obstructive sleep apnea was treated with CPAP.   FOLLOWUP:  PCP in one week.   DISCHARGE DIET:  Low cholesterol, no salt added.   DISCHARGE ACTIVITY:  As tolerated.   DISCHARGE MEDICATIONS:  1.  Resume preadmission medications as listed on the reconciliation      list on October 01, 2006.  2. Add prednisone 10 mg four tablets orally twice daily for three      days, then four tablets orally once daily for three days, then two      tablets orally once daily for three days, then one tablet orally      for three days and      discontinue.  3. Change nebulizer treatment with Albuterol and Atrovent to q.6h for      three days and then q.6h p.r.n. dyspnea or wheezing.      Charlynne Pander, MD  Electronically Signed     JO/MEDQ  D:  10/05/2006  T:  10/05/2006  Job:  [id number]

## 2011-04-13 NOTE — H&P (Signed)
[name], [name]                   ACCOUNT NO.:  [id number]   MEDICAL RECORD NO.:  [id number]          PATIENT TYPE:  OBV   LOCATION:  A204                          FACILITY:  [facility]   PHYSICIAN:  Margaretmary Dys, M.D.DATE OF BIRTH:  [birth year]   DATE OF ADMISSION:  10/03/2006  DATE OF DISCHARGE:  11/10/2007LH                                HISTORY & PHYSICAL   PRIMARY CARE PHYSICIAN:  The patient is unassigned.   ADMITTING DIAGNOSES:  1. Acute chronic obstructive pulmonary disease exacerbation.  2. Morbid obesity.  3. Obstructive sleep apnea.   CHIEF COMPLAINT:  Increasing shortness of breath and cough over the past 2  days.   HISTORY OF PRESENT ILLNESS:  [name] is a 70 year old Caucasian female who  presented to the emergency room with increasing cough and shortness of  breath over the past 2 days.  She does have a history of chronic obstructive  pulmonary disease diagnosed a few years ago for which she takes p.r.n.  albuterol and an over the counter inhaler.   The patient reports that she was doing fairly well until yesterday morning,  when she woke up and noticed that she had some cough over the last 24 hours  and it has gotten progressively productive with green sputum.  She has also  had some exertional dyspnea.  She denies any fevers or chills.  She did have  some chest tightness, but said that improved after she received some  breathing treatments in the emergency room.  She reports having some nasal  congestion and some generalized myalgia.  She denies any pleuritic chest  pain.  She has tried her inhaler at home multiple times without any  improvement.   She denies any nausea, vomiting or diarrhea.  Evaluation in the emergency  room revealed that she was in acute COPD exacerbation.  A chest x-ray did  not reveal any evidence of pneumonia; however, despite receiving nebulizer  treatments in the emergency room, she did not have any significant  improvement.  The  patient is now being admitted for further evaluation and  management.   REVIEW OF SYSTEMS:  She has had no weight loss.  She claims compliance to  her BiPAP mask, which she wears with oxygen.  She has no frequency, urgency  or dysuria, no hematuria, no melenic stools, no headaches or dizziness.   PAST MEDICAL HISTORY:  1. Chronic bronchitis/COPD.  2. Fibromyalgia.  3. Hypercholesterolemia.  4. Hypothyroidism.  5. History of palpitations with no coronary tree disease.  6. Obstructive sleep apnea.  7. Osteoarthritis.  8. Hypertension.  9. Polypoid disease of the colon on colonoscopy.   MEDICATIONS:  She is currently on:  1. Inderal 50 mg p.o. once a day.  2. Ativan 1.5 mg p.o. once a day.  3. Prozac 60 mg one tablet p.o. once a day.  4. Albuterol two puffs q.i.d. p.r.n.  5. Synthroid 88 mcg p.o. once a day.  6. Nexium 40 mg p.o. once a day.  7. Aspirin 81 mg p.o. once a day.  8. Albuterol puffs b.i.d.  ALLERGIES:  The patient reports allergies to DEMEROL, which causes severe  hypotension and syncope.   FAMILY HISTORY:  Positive for diabetes in an aunt, hypertension, obesity.  No significant coronary artery disease.   SOCIAL HISTORY:  The patient is married and lives with her husband.  She has  2 grown daughters who are doing fairly well.  The patient smokes about a  half a pack of cigarettes a day for 30 years, but quit in 1991; she quit  because she just was worried about health effects of continued smoking.  She  was mostly a homemaker.  She is originally from [place], but settled  here decades ago.  No significant history of alcohol abuse or illicit drug  use.   PHYSICAL EXAMINATION:  GENERAL:  Conscious, alert, in mild respiratory  distress, but pleasant.  VITAL SIGNS:  On arrival here in the emergency room, blood pressure was  121/77, pulse of 80, respirations were 22, temperature 98.9.  Oxygen  saturation was 96% on room air.  HEENT:  Normocephalic,  atraumatic.  Oral mucosa was moist with no exudates.  NECK:  Supple.  No JVD.  LUNGS:  Bilateral rhonchi with expiratory wheezes.  HEART:  S1 and S2, regular.  No S3, S4, gallops or rubs.  ABDOMEN:  Soft and nontender.  Bowel sounds were positive.  No masses  palpable.  EXTREMITIES:  No pitting pedal edema.  No calf induration or tenderness were  noted.  CNS:  Exam was grossly intact with no focal neurologic deficits.   LABORATORY/DIAGNOSTIC DATA:  Chest x-ray in the emergency room showed no  acute abnormalities.   Blood gas on room air showed pH of 7.442, PCO2 38.5, PO2 of 73.2 and  bicarbonate was 25.9.  Oxygen saturation was 92.7%.  WBC was 9.5, hemoglobin  of 12.9, hematocrit 38.7 and platelet count was 293,000 with no left shift.  Sodium was 139, potassium 3.8, chloride of 106, CO2 26, glucose 124, BUN of  17, creatinine was 0.8 and calcium was 8.8.   ASSESSMENT AND PLAN:  1. [name] is a 70 year old Caucasian female with history of chronic      obstructive pulmonary disease, who presents to the emergency room with      increasing cough productive of purulent sputum of 2 days' duration.   Evaluation reveals the patient is an acute chronic obstructive pulmonary  disease exacerbation.  She has tried her inhalers at home without any  improvement.  She did have some mild improvement on multiple nebulizer  treatments in the emergency room.   Plan is to admit her at this time.  We will start her on Levaquin 750 mg  intravenously once a day.  I will also put her on Solu-Medrol 60 mg  intravenously q.6 h. as she does have rhonchi and expiratory wheeze  bilaterally.  I will put her on every-4-hour nebulizers and every 2 hours as  needed, both with albuterol and Atrovent.   This is an index episode of exacerbation requiring hospitalization in a long  time.  We will have to evaluate her for possible need for long-term maintenance therapy such as Advair or Spiriva.  She may need a  pulmonary  function test once she is more stable.   1. Obstructive sleep apnea.  The patient reports compliance to her      continuous positive airway pressure machine.  She also wears oxygen at      night.  We will continue with the oxygen at  night here and then we may      use sleep apnea machine; the patient may bring in her own continuous      positive airway pressure machine.   1. Obesity, likely predisposing her to obstructive sleep apnea above.  The      patient is not very active and she does not partake in any exercise      regimen and I have encouraged her to try and join an exercise club or      possible referral for pulmonary rehabilitation, depending on what her      pulmonary function test shows.   I will resume all of her home medications at this time.  I have discussed  the above plan with the patient and she verbalized full understanding.   CODE STATUS:  The patient is a full code.      Margaretmary Dys, M.D.  Electronically Signed     AM/MEDQ  D:  10/02/2006  T:  10/02/2006  Job:  [id number]

## 2011-04-13 NOTE — H&P (Signed)
NAMEMADDISEN, [name]                   ACCOUNT NO.:  [id number]   MEDICAL RECORD NO.:  [id number]          PATIENT TYPE:  AMB   LOCATION:  DAY                           FACILITY:  [facility]   PHYSICIAN:  Jerolyn Shin C. Katrinka Blazing, M.D.   DATE OF BIRTH:  1941/07/04   DATE OF ADMISSION:  DATE OF DISCHARGE:  LH                                HISTORY & PHYSICAL   A 70 year old female with a history of colon polyps dating back to 77.  She had a polypectomy at that time.  She had a repeat colonoscopy in 1995,  and had polypectomy x 8.  She had another colonoscopy in 2000, where she had  polypectomy x 5.  Hypoplastic polyps were the prevailing pathology on all of  the polyps that have been removed.  The patient has had episodic diarrhea  with occasional episodes of bleeding.  She thinks that the bleeding is  secondary to hemorrhoids.  She is scheduled for repeat colonoscopy for  followup of recurrent polypoid disease of the colon.   PAST HISTORY:  1.  Hypertension.  2.  Narcolepsy.  3.  Hyperlipidemia.  4.  Osteoarthritis.  5.  Fibromyalgia.   MEDICATIONS:  1.  Lasix 60 mg q.a.m.  2.  K-Dur 20 mEq every day.  3.  Tylenol #2 q.4h. p.r.n.  4.  Provigil 200 mg one half tab every day.  5.  Nexium 40 mg every day.  6.  Inderal LA 80 mg every day.  7.  Prozac 60 mg and 80 mg every day, alternating.  8.  Lorazepam 1.5 mg t.i.d.  9.  Zocor 20 mg every day.  10. Albuterol and Flovent inhalers.  11. Aspirin 81 mg every day.  12. Singulair 10 mg every day.   SURGERY:  1.  Hysterectomy.  2.  Bladder suspension.  3.  Surgery for kidney stones.   FAMILY HISTORY:  Positive for colon cancer, cerebral aneurysm.  There is  also a family history of chronic depression.   ALLERGIES:  1.  DEMEROL.  2.  LIPITOR.   PHYSICAL EXAMINATION:  VITAL SIGNS:  Blood pressure 110/68, pulse 76,  respirations 20, weight 269 pounds.  HEENT:  Unremarkable except for alopecia.  NECK:  Supple.  No JVD, bruit, adenopathy, or  thyromegaly.  CHEST:  Clear to auscultation.  HEART:  Regular rate and rhythm without murmur, gallop or rub.  ABDOMEN:  Soft, nontender.  No masses.  EXTREMITIES:  Chronic venous stasis changes with 1+ pitting edema  bilaterally.  NEUROLOGIC:  No focal motor, sensory, or cerebellar deficit.   IMPRESSION:  1.  Recurrent polypoid disease of the colon.  2.  History of rectal bleeding.  3.  Hypertension.  4.  Narcolepsy.  5.  Anxiety disorder.  6.  Hyperlipidemia.  7.  Sleep apnea.  8.  Gastroesophageal reflux disease.  9.  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   PLAN:  Repeat colonoscopy.     Lero   LCS/MEDQ  D:  09/27/2004  T:  09/28/2004  Job:  [id number]

## 2011-04-13 NOTE — Procedures (Signed)
NAMECHONG, JANUARY NO.:  [id number]   MEDICAL RECORD NO.:  [id number]          PATIENT TYPE:  OUT   LOCATION:  SLEEP LAB                     FACILITY:  [facility]   PHYSICIAN:  Barbaraann Share, MD,FCCPDATE OF BIRTH:  1942-04-13   DATE OF STUDY:                            NOCTURNAL POLYSOMNOGRAM   REFERRING PHYSICIAN:  Erle Crocker, M.D.   REFERRING PHYSICIAN:  Erle Crocker, M.D.   INDICATION FOR STUDY:  Hypersomnia with sleep apnea.  The patient has a  history of sleep apnea and returns for pressure optimization.   EPWORTH SLEEPINESS SCORE:  21.   MEDICATIONS:   SLEEP ARCHITECTURE:  Patient had total sleep time of 356 minutes with  decreased slow-wave sleep as well as REM.  Sleep onset latency was  normal at 12.5 minutes, and REM onset was very prolonged at 309 minutes.  Sleep efficiency was mildly decreased at 88%.   RESPIRATORY DATA:  The patient underwent CPAP titration with a small  ResMed Full Face Mask.  The pressure was increased incrementally in  order to control both snoring and obstructive events.  At a final  pressure of 13 cm, the patient had excellent control of her events even  through supine REM.  Tolerance appeared to be excellent.   OXYGEN DATA:  There was transient O2 desaturation as low as 86% prior to  CPAP optimization.  She had adequate saturations the rest of the night  on her optimal CPAP.   CARDIAC DATA:  Occasional PACs, but otherwise, no clinically significant  arrhythmias were noted.   MOVEMENT/PARASOMNIA:  Patient was found to have no significant leg jerks  or abnormal behaviors.   IMPRESSIONS/RECOMMENDATIONS:  1. CPAP titration study reveals good control of previously documented      obstructive sleep apnea with a small ResMed Full Face Mask at a      final pressure of 13 cm of water.  Patient should also be      encouraged to work      aggressively on weight loss.  2. Occasional PACs but no clinically significant  arrhythmias.      Barbaraann Share, MD,FCCP  Diplomate, American Board of Sleep  Medicine  Electronically Signed     [facility]/MEDQ  D:  08/24/2008 15:13:33  T:  08/24/2008 16:09:53  Job:  [id number]

## 2011-04-13 NOTE — Procedures (Signed)
NAMEKLYNN, LINNEMANN NO.:  [id number]   MEDICAL RECORD NO.:  [id number]          PATIENT TYPE:  OUT   LOCATION:  RAD                           FACILITY:  [facility]   PHYSICIAN:  Vida Roller, M.D.   DATE OF BIRTH:  [birth year]   DATE OF PROCEDURE:  DATE OF DISCHARGE:                                  ECHOCARDIOGRAM   REFERRING PHYSICIAN:  Dr. Mirna Mires   TAPE NUMBER:  [id number]   TAPE COUNT:  [id number]   This is a 70 year old female with shortness of breath and peripheral edema.  No previous studies.  Please note that on the tape of this echocardiogram  the patient's name is annotated as Annia Friendly. Hill who is obviously the  referring physician and not the patient.  It does, however, annotate that it  is a 70 year old female so I am pretty sure this is Mrs. Olenick  echocardiogram.  Technical quality is adequate.   M-MODE TRACINGS:  The aorta is 24 mm.   Left atrium is 51 mm.   The septum is 12 mm.   Posterior wall is 12 mm.   Left ventricular diastolic dimension is 52 mm.   Left ventricular systolic dimension is 39 mm.   2-D AND DOPPLER IMAGING:  The left ventricle is normal size with mild  concentric left ventricular hypertrophy.  There is very mild decrement in LV  function with an estimated ejection fraction of 50-55%.  There are no  obvious wall motion abnormalities seen.   The right ventricle is generous and may actually be dilated, although it was  not measured.  There is slightly depressed right ventricular systolic  function as well.  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   Both atria are dilated.   The aortic valve is sclerotic.  There is no stenosis or regurgitation seen.  The mitral valve has mild annular calcification with mild regurgitation.  No  stenosis is  seen.   The tricuspid valve has mild regurgitation.   Pulmonic valve was not well seen.   There is no pericardial effusion.   The inferior vena cava appears to be slightly dilated.   The ascending aorta was not well seen.     Trey Paula   J[facility]/MEDQ  D:  10/02/2004  T:  10/02/2004  Job:  [id number]

## 2011-04-19 ENCOUNTER — Encounter: Payer: Medicare Other | Attending: Family Medicine | Admitting: *Deleted

## 2011-04-19 DIAGNOSIS — Z713 Dietary counseling and surveillance: Secondary | ICD-10-CM | POA: Insufficient documentation

## 2011-05-04 ENCOUNTER — Other Ambulatory Visit (HOSPITAL_COMMUNITY): Payer: Self-pay | Admitting: Family Medicine

## 2011-05-04 DIAGNOSIS — K760 Fatty (change of) liver, not elsewhere classified: Secondary | ICD-10-CM

## 2011-05-08 ENCOUNTER — Encounter (HOSPITAL_COMMUNITY): Payer: Self-pay

## 2011-05-08 ENCOUNTER — Encounter (HOSPITAL_COMMUNITY)
Admission: RE | Admit: 2011-05-08 | Discharge: 2011-05-08 | Disposition: A | Discharge status: Home / Self Care | Payer: Medicare Other | Source: Ambulatory Visit | Attending: Family Medicine | Admitting: Family Medicine

## 2011-05-08 DIAGNOSIS — R109 Unspecified abdominal pain: Secondary | ICD-10-CM | POA: Insufficient documentation

## 2011-05-08 DIAGNOSIS — K7689 Other specified diseases of liver: Secondary | ICD-10-CM | POA: Insufficient documentation

## 2011-05-08 DIAGNOSIS — R932 Abnormal findings on diagnostic imaging of liver and biliary tract: Secondary | ICD-10-CM | POA: Insufficient documentation

## 2011-05-08 DIAGNOSIS — K760 Fatty (change of) liver, not elsewhere classified: Secondary | ICD-10-CM

## 2011-05-08 MED ORDER — TECHNETIUM TC 99M MEBROFENIN IV KIT
5.0000 | PACK | Freq: Once | INTRAVENOUS | Status: AC | PRN
  Administered 2011-05-08: 5.3 via INTRAVENOUS

## 2011-06-26 ENCOUNTER — Ambulatory Visit: Payer: Medicare Other | Admitting: *Deleted

## 2011-07-03 ENCOUNTER — Encounter: Payer: Self-pay | Admitting: Gastroenterology

## 2011-07-04 ENCOUNTER — Ambulatory Visit: Payer: Medicare Other | Admitting: Gastroenterology

## 2011-12-25 DIAGNOSIS — E8779 Other fluid overload: Secondary | ICD-10-CM | POA: Diagnosis not present

## 2011-12-25 DIAGNOSIS — E039 Hypothyroidism, unspecified: Secondary | ICD-10-CM | POA: Diagnosis not present

## 2011-12-25 DIAGNOSIS — G4733 Obstructive sleep apnea (adult) (pediatric): Secondary | ICD-10-CM | POA: Diagnosis not present

## 2011-12-25 DIAGNOSIS — E781 Pure hyperglyceridemia: Secondary | ICD-10-CM | POA: Diagnosis not present

## 2011-12-25 DIAGNOSIS — R0989 Other specified symptoms and signs involving the circulatory and respiratory systems: Secondary | ICD-10-CM | POA: Diagnosis not present

## 2011-12-25 DIAGNOSIS — R0609 Other forms of dyspnea: Secondary | ICD-10-CM | POA: Diagnosis not present

## 2011-12-25 DIAGNOSIS — I10 Essential (primary) hypertension: Secondary | ICD-10-CM | POA: Diagnosis not present

## 2011-12-25 DIAGNOSIS — I4892 Unspecified atrial flutter: Secondary | ICD-10-CM | POA: Diagnosis not present

## 2012-01-09 DIAGNOSIS — R0609 Other forms of dyspnea: Secondary | ICD-10-CM | POA: Diagnosis not present

## 2012-01-09 DIAGNOSIS — I4892 Unspecified atrial flutter: Secondary | ICD-10-CM | POA: Diagnosis not present

## 2012-01-09 DIAGNOSIS — R0989 Other specified symptoms and signs involving the circulatory and respiratory systems: Secondary | ICD-10-CM | POA: Diagnosis not present

## 2012-01-09 DIAGNOSIS — R0602 Shortness of breath: Secondary | ICD-10-CM | POA: Diagnosis not present

## 2012-01-11 DIAGNOSIS — R0609 Other forms of dyspnea: Secondary | ICD-10-CM | POA: Diagnosis not present

## 2012-01-11 DIAGNOSIS — R0602 Shortness of breath: Secondary | ICD-10-CM | POA: Diagnosis not present

## 2012-01-11 DIAGNOSIS — I4892 Unspecified atrial flutter: Secondary | ICD-10-CM | POA: Diagnosis not present

## 2012-01-18 DIAGNOSIS — R0609 Other forms of dyspnea: Secondary | ICD-10-CM | POA: Diagnosis not present

## 2012-01-18 DIAGNOSIS — G4733 Obstructive sleep apnea (adult) (pediatric): Secondary | ICD-10-CM | POA: Diagnosis not present

## 2012-01-18 DIAGNOSIS — I10 Essential (primary) hypertension: Secondary | ICD-10-CM | POA: Diagnosis not present

## 2012-01-18 DIAGNOSIS — I4892 Unspecified atrial flutter: Secondary | ICD-10-CM | POA: Diagnosis not present

## 2012-01-18 DIAGNOSIS — R0989 Other specified symptoms and signs involving the circulatory and respiratory systems: Secondary | ICD-10-CM | POA: Diagnosis not present

## 2012-01-25 ENCOUNTER — Encounter (HOSPITAL_COMMUNITY): Payer: Self-pay | Admitting: Pharmacy Technician

## 2012-01-28 DIAGNOSIS — E669 Obesity, unspecified: Secondary | ICD-10-CM | POA: Diagnosis not present

## 2012-01-28 DIAGNOSIS — I10 Essential (primary) hypertension: Secondary | ICD-10-CM | POA: Diagnosis not present

## 2012-01-28 DIAGNOSIS — Z79899 Other long term (current) drug therapy: Secondary | ICD-10-CM | POA: Diagnosis not present

## 2012-02-05 ENCOUNTER — Other Ambulatory Visit: Payer: Self-pay

## 2012-02-05 ENCOUNTER — Ambulatory Visit (HOSPITAL_COMMUNITY)
Admission: RE | Admit: 2012-02-05 | Discharge: 2012-02-05 | Disposition: A | Discharge status: Home / Self Care | Payer: Medicare Other | Source: Ambulatory Visit | Attending: Cardiology | Admitting: Cardiology

## 2012-02-05 ENCOUNTER — Encounter (HOSPITAL_COMMUNITY)
Admission: RE | Disposition: A | Discharge status: Home / Self Care | Payer: Self-pay | Source: Ambulatory Visit | Attending: Cardiology

## 2012-02-05 ENCOUNTER — Encounter (HOSPITAL_COMMUNITY): Payer: Self-pay

## 2012-02-05 ENCOUNTER — Ambulatory Visit (HOSPITAL_COMMUNITY): Payer: Medicare Other

## 2012-02-05 DIAGNOSIS — M109 Gout, unspecified: Secondary | ICD-10-CM | POA: Insufficient documentation

## 2012-02-05 DIAGNOSIS — R079 Chest pain, unspecified: Secondary | ICD-10-CM | POA: Insufficient documentation

## 2012-02-05 DIAGNOSIS — E039 Hypothyroidism, unspecified: Secondary | ICD-10-CM | POA: Diagnosis not present

## 2012-02-05 DIAGNOSIS — E669 Obesity, unspecified: Secondary | ICD-10-CM | POA: Diagnosis not present

## 2012-02-05 DIAGNOSIS — Z6841 Body Mass Index (BMI) 40.0 and over, adult: Secondary | ICD-10-CM | POA: Diagnosis not present

## 2012-02-05 DIAGNOSIS — I4891 Unspecified atrial fibrillation: Secondary | ICD-10-CM | POA: Insufficient documentation

## 2012-02-05 DIAGNOSIS — I10 Essential (primary) hypertension: Secondary | ICD-10-CM | POA: Diagnosis not present

## 2012-02-05 DIAGNOSIS — R0989 Other specified symptoms and signs involving the circulatory and respiratory systems: Secondary | ICD-10-CM | POA: Insufficient documentation

## 2012-02-05 DIAGNOSIS — G4733 Obstructive sleep apnea (adult) (pediatric): Secondary | ICD-10-CM | POA: Diagnosis not present

## 2012-02-05 DIAGNOSIS — E8779 Other fluid overload: Secondary | ICD-10-CM | POA: Insufficient documentation

## 2012-02-05 DIAGNOSIS — R0609 Other forms of dyspnea: Secondary | ICD-10-CM | POA: Insufficient documentation

## 2012-02-05 HISTORY — PX: CARDIOVERSION: SHX1299

## 2012-02-05 LAB — CBC
Platelets: 257 10*3/uL (ref 150–400)
RDW: 13.3 % (ref 11.5–15.5)
WBC: 7.5 10*3/uL (ref 4.0–10.5)

## 2012-02-05 LAB — BASIC METABOLIC PANEL
Chloride: 104 mEq/L (ref 96–112)
GFR calc Af Amer: >90 mL/min (ref >90)
Potassium: 3.7 mEq/L (ref 3.5–5.1)
Sodium: 140 mEq/L (ref 135–145)

## 2012-02-05 SURGERY — CARDIOVERSION
Anesthesia: General | Wound class: Clean

## 2012-02-05 MED ORDER — HYDROCORTISONE 1 % EX CREA: 1.0000 "application " | TOPICAL_CREAM | Freq: Three times a day (TID) | CUTANEOUS | Status: DC | PRN

## 2012-02-05 MED ORDER — SODIUM CHLORIDE 0.9 % IV SOLN: 250.0000 mL | INTRAVENOUS | Status: DC

## 2012-02-05 MED ORDER — SODIUM CHLORIDE 0.9 % IJ SOLN: 3.0000 mL | Freq: Two times a day (BID) | INTRAMUSCULAR | Status: DC

## 2012-02-05 MED ORDER — LIDOCAINE HCL (CARDIAC) 20 MG/ML IV SOLN
INTRAVENOUS | Status: DC | PRN
  Administered 2012-02-05: 40 mg via INTRAVENOUS

## 2012-02-05 MED ORDER — SODIUM CHLORIDE 0.9 % IJ SOLN: 3.0000 mL | INTRAMUSCULAR | Status: DC | PRN

## 2012-02-05 MED ORDER — PROPOFOL 10 MG/ML IV BOLUS
INTRAVENOUS | Status: DC | PRN
  Administered 2012-02-05: 70 mg via INTRAVENOUS

## 2012-02-05 NOTE — Discharge Instructions (Signed)
PATIENT INSTRUCTIONS  POST-ANESTHESIA    IMMEDIATELY FOLLOWING SURGERY:  Do not drive or operate machinery for the first twenty four hours after surgery.  Do not make any important decisions for twenty four hours after surgery or while taking narcotic pain medications or sedatives.  If you develop intractable nausea and vomiting or a severe headache please notify your doctor immediately.    FOLLOW-UP:  Please make an appointment with your surgeon as instructed. You do not need to follow up with anesthesia unless specifically instructed to do so    QUESTIONS?:  Please feel free to call your physician or the hospital operator if you have any questions, and they will be happy to assist you.       [facility] Anesthesia Department  [address]  [place]  [phone]

## 2012-02-05 NOTE — H&P (Signed)
  Please see paper chart. Scheduled for cardioversion for A. Fib.

## 2012-02-05 NOTE — Anesthesia Postprocedure Evaluation (Signed)
  Anesthesia Post-op Note  Patient: [name]  Procedure(s) Performed: Procedure(s) (LRB): CARDIOVERSION (N/A)  Patient Location: Short Stay  Anesthesia Type: General  Level of Consciousness: awake, alert , oriented and patient cooperative  Airway and Oxygen Therapy: Patient Spontanous Breathing and Patient connected to nasal cannula oxygen  Post-op Pain: none  Post-op Assessment: Post-op Vital signs reviewed, PATIENT'S CARDIOVASCULAR STATUS UNSTABLE and RESPIRATORY FUNCTION UNSTABLE  Post-op Vital Signs: Reviewed and stable  Complications: No apparent anesthesia complications

## 2012-02-05 NOTE — Transfer of Care (Signed)
Immediate Anesthesia Transfer of Care Note  Patient: [name]  Procedure(s) Performed: Procedure(s) (LRB): CARDIOVERSION (N/A)  Patient Location: Short Stay  Anesthesia Type: General  Level of Consciousness: awake, alert , oriented and patient cooperative  Airway & Oxygen Therapy: Patient Spontanous Breathing and Patient connected to nasal cannula oxygen  Post-op Assessment: Report given to PACU RN and Post -op Vital signs reviewed and stable  Post vital signs: Reviewed and stable  Complications: No apparent anesthesia complications

## 2012-02-05 NOTE — CV Procedure (Signed)
Direct current cardioversion: Indication symptomatic A. Fibrillation. Procedure: Using 70 mg of IV Propofol 40 mg Lidocaine for achieving deep (Moderate sedation), synchronized direct current cardioversion performed. Patient was delivered with 150 Joules of electricity with success from A. Fibrillation  to NSR. Patient tolerated the procedure well. No immediate complication noted.

## 2012-02-05 NOTE — Anesthesia Preprocedure Evaluation (Signed)
Anesthesia Evaluation  Patient identified by MRN, date of birth, ID band Patient awake    Reviewed: Allergy & Precautions, H&P , NPO status , Patient's Chart, lab work & pertinent test results  History of Anesthesia Complications Negative for: history of anesthetic complications  Airway Mallampati: I      Dental  (+) Edentulous Upper   Pulmonary asthma , sleep apnea and Continuous Positive Airway Pressure Ventilation ,          Cardiovascular Rhythm:Irregular Rate:Normal     Neuro/Psych  Headaches, PSYCHIATRIC DISORDERS Anxiety Depression  Neuromuscular disease    GI/Hepatic GERD-  Medicated,  Endo/Other  Hypothyroidism   Renal/GU      Musculoskeletal  (+) Fibromyalgia -  Abdominal   Peds  Hematology   Anesthesia Other Findings   Reproductive/Obstetrics                           Anesthesia Physical Anesthesia Plan  ASA: III  Anesthesia Plan: General   Post-op Pain Management:    Induction: Intravenous  Airway Management Planned: Mask  Additional Equipment:   Intra-op Plan:   Post-operative Plan:   Informed Consent: I have reviewed the patients History and Physical, chart, labs and discussed the procedure including the risks, benefits and alternatives for the proposed anesthesia with the patient or authorized representative who has indicated his/her understanding and acceptance.     Plan Discussed with:   Anesthesia Plan Comments:         Anesthesia Quick Evaluation

## 2012-02-11 ENCOUNTER — Encounter (HOSPITAL_COMMUNITY): Payer: Self-pay | Admitting: Cardiology

## 2012-02-20 DIAGNOSIS — I4891 Unspecified atrial fibrillation: Secondary | ICD-10-CM | POA: Diagnosis not present

## 2012-02-20 DIAGNOSIS — G4733 Obstructive sleep apnea (adult) (pediatric): Secondary | ICD-10-CM | POA: Diagnosis not present

## 2012-02-20 DIAGNOSIS — I10 Essential (primary) hypertension: Secondary | ICD-10-CM | POA: Diagnosis not present

## 2012-02-20 DIAGNOSIS — R0609 Other forms of dyspnea: Secondary | ICD-10-CM | POA: Diagnosis not present

## 2012-02-20 DIAGNOSIS — E8779 Other fluid overload: Secondary | ICD-10-CM | POA: Diagnosis not present

## 2012-02-20 DIAGNOSIS — R0989 Other specified symptoms and signs involving the circulatory and respiratory systems: Secondary | ICD-10-CM | POA: Diagnosis not present

## 2012-04-10 ENCOUNTER — Emergency Department (HOSPITAL_COMMUNITY): Payer: Medicare Other

## 2012-04-10 ENCOUNTER — Emergency Department (HOSPITAL_COMMUNITY)
Admission: EM | Admit: 2012-04-10 | Discharge: 2012-04-10 | Disposition: A | Discharge status: Home / Self Care | Payer: Medicare Other | Attending: Emergency Medicine | Admitting: Emergency Medicine

## 2012-04-10 ENCOUNTER — Encounter (HOSPITAL_COMMUNITY): Payer: Self-pay | Admitting: *Deleted

## 2012-04-10 DIAGNOSIS — K219 Gastro-esophageal reflux disease without esophagitis: Secondary | ICD-10-CM | POA: Diagnosis not present

## 2012-04-10 DIAGNOSIS — M25579 Pain in unspecified ankle and joints of unspecified foot: Secondary | ICD-10-CM | POA: Insufficient documentation

## 2012-04-10 DIAGNOSIS — G47419 Narcolepsy without cataplexy: Secondary | ICD-10-CM | POA: Diagnosis not present

## 2012-04-10 DIAGNOSIS — E039 Hypothyroidism, unspecified: Secondary | ICD-10-CM | POA: Diagnosis not present

## 2012-04-10 DIAGNOSIS — E785 Hyperlipidemia, unspecified: Secondary | ICD-10-CM | POA: Diagnosis not present

## 2012-04-10 DIAGNOSIS — J45909 Unspecified asthma, uncomplicated: Secondary | ICD-10-CM | POA: Diagnosis not present

## 2012-04-10 DIAGNOSIS — G4733 Obstructive sleep apnea (adult) (pediatric): Secondary | ICD-10-CM | POA: Insufficient documentation

## 2012-04-10 HISTORY — DX: Cholecystitis, unspecified: K81.9

## 2012-04-10 MED ORDER — OXYCODONE-ACETAMINOPHEN 5-325 MG PO TABS
2.0000 | ORAL_TABLET | Freq: Once | ORAL | Status: AC
  Administered 2012-04-10: 2 via ORAL
  Filled 2012-04-10: qty 2

## 2012-04-10 MED ORDER — PREDNISONE 10 MG PO TABS: 50.0000 mg | ORAL_TABLET | Freq: Every day | ORAL | Status: AC

## 2012-04-10 MED ORDER — OXYCODONE-ACETAMINOPHEN 5-325 MG PO TABS: 2.0000 | ORAL_TABLET | ORAL | Status: AC | PRN

## 2012-04-10 MED ORDER — IBUPROFEN 800 MG PO TABS
800.0000 mg | ORAL_TABLET | Freq: Once | ORAL | Status: AC
  Administered 2012-04-10: 800 mg via ORAL
  Filled 2012-04-10: qty 1

## 2012-04-10 MED ORDER — ONDANSETRON 8 MG PO TBDP
8.0000 mg | ORAL_TABLET | Freq: Once | ORAL | Status: AC
  Administered 2012-04-10: 8 mg via ORAL
  Filled 2012-04-10: qty 1

## 2012-04-10 NOTE — ED Provider Notes (Signed)
History     CSN: [id number]  Arrival date & time 04/10/12  0218   First MD Initiated Contact with Patient 04/10/12 0256      No chief complaint on file.   (Consider location/radiation/quality/duration/timing/severity/associated sxs/prior treatment) HPI  [name] is a 71 y.o. female who presents to the Emergency Department complaining of progressive, increasing pain to the left ankle over the course of one week.There is no known injury. Pain is a burning, aching, throbbing pain it is located across the entire ankle. She denies fever, chills, nausea, vomiting, diarrhea, cough. She has no inflammation or pain in other joints.  PCP Dr. Loleta Chance   Past Medical History  Diagnosis Date  . GERD (gastroesophageal reflux disease)   . Fibromyalgia   . Anxiety   . Depression   . Obesity, morbid (more than 100 lbs over ideal weight or BMI > 40)   . Hyperlipemia   . Hypothyroidism   . Asthma   . Obstructive sleep apnea on CPAP and O2  . Migraine   . Colon polyp     TCS 2005 TICS [place], TCS/PROPOFOL 2011 SIMPLE [place]ENOMAS  . Allergic rhinitis   . Osteoarthritis   . Palpitations   . Narcolepsy   . Cholecystitis   . Afib     Past Surgical History  Procedure Date  . Abdominal hysterectomy FIBROIDS 1982  . Rhinoplasty   . Bladder suspension 1982  . Kidney stent 1995  . Cardioversion 02/05/2012    Procedure: CARDIOVERSION;  Surgeon: Pamella Pert, MD;  Location[facility] OR;  Service: Cardiovascular;  Laterality: N/A;    History reviewed. No pertinent family history.  History  Substance Use Topics  . Smoking status: Former Games developer  . Smokeless tobacco: Never Used   Comment: 22 years ago  . Alcohol Use: No    OB History    Grav Para Term Preterm Abortions TAB SAB Ect Mult Living                  Review of Systems  Constitutional: Negative for fever.       10 Systems reviewed and are negative for acute change except as noted in the HPI.  HENT: Negative for congestion.   Eyes:  Negative for discharge and redness.  Respiratory: Negative for cough and shortness of breath.   Cardiovascular: Negative for chest pain.  Gastrointestinal: Negative for vomiting and abdominal pain.  Musculoskeletal: Negative for back pain.       Left ankle pain  Skin: Negative for rash.  Neurological: Negative for syncope, numbness and headaches.  Psychiatric/Behavioral:       No behavior change.    Allergies  Atorvastatin; Meperidine hcl; and Zocor  Home Medications   Current Outpatient Rx  Name Route Sig Dispense Refill  . ALBUTEROL SULFATE (5 MG/ML) 0.5% IN NEBU Nebulization Take 2.5 mg by nebulization every 6 (six) hours as needed. For shortness of breath    . ALBUTEROL 90 MCG/ACT IN AERS Inhalation Inhale 2 puffs into the lungs every 6 (six) hours as needed. For shortness of breath    . ASPIRIN 81 MG PO TABS Oral Take 81 mg by mouth 2 (two) times daily.    Marland Kitchen DABIGATRAN ETEXILATE MESYLATE 150 MG PO CAPS Oral Take 150 mg by mouth every 12 (twelve) hours.    Marland Kitchen DIGOXIN 0.25 MG PO TABS Oral Take 250 mcg by mouth daily.    Marland Kitchen ESOMEPRAZOLE MAGNESIUM 40 MG PO CPDR Oral Take 40 mg by mouth  daily before breakfast.      . FLUOXETINE HCL 20 MG PO CAPS Oral Take 60 mg by mouth daily.      Marland Kitchen FLUTICASONE PROPIONATE 50 MCG/ACT NA SUSP Nasal Place 2 sprays into the nose daily as needed. For allergies    . LEVOTHYROXINE SODIUM 50 MCG PO TABS Oral Take 50 mcg by mouth daily.      Marland Kitchen LORAZEPAM 1 MG PO TABS Oral Take 1.5 mg by mouth every 8 (eight) hours.      Marland Kitchen MODAFINIL 200 MG PO TABS Oral Take 200 mg by mouth daily as needed. For driving    . POTASSIUM CHLORIDE 10 MEQ PO TBCR Oral Take 10 mEq by mouth daily as needed. Only takes when takes Torsemide for swelling    . PROPRANOLOL HCL ER BEADS 80 MG PO CP24 Oral Take 80 mg by mouth every morning.     . TORSEMIDE 20 MG PO TABS Oral Take 20 mg by mouth daily as needed. For swelling      BP 103/58  Pulse 78  Temp 97.9 F (36.6 C)  Resp 24  Ht 5'  2" (1.575 m)  Wt 280 lb (127.007 kg)  BMI 51.21 kg/m2  SpO2 97%  Physical Exam  Nursing note and vitals reviewed. Constitutional:       Awake, alert, nontoxic appearance. Obese.  HENT:  Head: Atraumatic.  Eyes: Right eye exhibits no discharge. Left eye exhibits no discharge.  Neck: Neck supple.  Cardiovascular: Normal rate and normal heart sounds.   Pulmonary/Chest: Effort normal. She exhibits no tenderness.  Abdominal: Soft. There is no tenderness. There is no rebound.  Musculoskeletal: She exhibits no tenderness.       Baseline ROM except for left ankle, no obvious new focal weakness. Left lateral ankle with tenderness to palpation, mild warmth, no erythema. Focal tenderness to the medial ankle with no warmth or erythema. Limited ROM due to discomfort.  Neurological:       Mental status and motor strength appears baseline for patient and situation.  Skin: No rash noted.  Psychiatric: She has a normal mood and affect.    ED Course  Procedures (including critical care time)  Labs Reviewed - No data to display Dg Ankle Complete Left  04/10/2012  *RADIOLOGY REPORT*  Clinical Data: Ankle pain for few days.  No known injury.  LEFT ANKLE COMPLETE - 3+ VIEW  Comparison: None  Findings: The left ankle appears intact.  No evidence of acute fracture or subluxation.  No focal bone lesion or bone erosion.  No abnormal periosteal reaction or sclerosis.  Bone cortex and trabecular architecture appear intact.  Prominent plantar calcaneal spur.  IMPRESSION: No acute bony abnormalities.  Original Report Authenticated By: Marlon Pel, M.D.        MDM  Patient with a weeklong history of left ankle pain and no known injury. She has a history of gout. The ankle is sore, with mild erythema, and mild swelling. X-rays do not reveal any acute process. She was given analgesics here with partial relief of her discomfort.Pt stable in ED with no significant deterioration in condition.The patient  appears reasonably screened and/or stabilized for discharge and I doubt any other medical condition or other [facility] requiring further screening, evaluation, or treatment in the ED at this time prior to discharge.  MDM Reviewed: nursing note and vitals Interpretation: x-ray           Nicoletta Dress. Colon Branch, MD 04/10/12 [phone]

## 2012-04-10 NOTE — ED Notes (Signed)
Pt reports that about a week ago, she began feeling ache in left ankle.  Pt reports increased pain since that time.  Denies known injury.

## 2012-04-10 NOTE — Discharge Instructions (Signed)
Keep the ankle elevated as much as possible. He may apply ice for comfort. Take 2 days of prednisone. Use the stronger pain medicine as needed. Followup with your doctor.

## 2012-05-01 DIAGNOSIS — E781 Pure hyperglyceridemia: Secondary | ICD-10-CM | POA: Diagnosis not present

## 2012-05-01 DIAGNOSIS — E039 Hypothyroidism, unspecified: Secondary | ICD-10-CM | POA: Diagnosis not present

## 2012-05-01 DIAGNOSIS — I4891 Unspecified atrial fibrillation: Secondary | ICD-10-CM | POA: Diagnosis not present

## 2012-05-01 DIAGNOSIS — K831 Obstruction of bile duct: Secondary | ICD-10-CM | POA: Diagnosis not present

## 2012-05-01 DIAGNOSIS — I10 Essential (primary) hypertension: Secondary | ICD-10-CM | POA: Diagnosis not present

## 2012-06-13 DIAGNOSIS — J210 Acute bronchiolitis due to respiratory syncytial virus: Secondary | ICD-10-CM | POA: Diagnosis not present

## 2012-06-13 DIAGNOSIS — J069 Acute upper respiratory infection, unspecified: Secondary | ICD-10-CM | POA: Diagnosis not present

## 2012-07-24 DIAGNOSIS — N390 Urinary tract infection, site not specified: Secondary | ICD-10-CM | POA: Diagnosis not present

## 2012-07-24 DIAGNOSIS — G4730 Sleep apnea, unspecified: Secondary | ICD-10-CM | POA: Diagnosis not present

## 2012-07-24 DIAGNOSIS — E782 Mixed hyperlipidemia: Secondary | ICD-10-CM | POA: Diagnosis not present

## 2012-07-24 DIAGNOSIS — I4891 Unspecified atrial fibrillation: Secondary | ICD-10-CM | POA: Diagnosis not present

## 2012-07-24 DIAGNOSIS — I10 Essential (primary) hypertension: Secondary | ICD-10-CM | POA: Diagnosis not present

## 2012-08-29 ENCOUNTER — Encounter (HOSPITAL_COMMUNITY): Payer: Self-pay | Admitting: *Deleted

## 2012-08-29 ENCOUNTER — Emergency Department (HOSPITAL_COMMUNITY)
Admission: EM | Admit: 2012-08-29 | Discharge: 2012-08-29 | Disposition: A | Discharge status: Home / Self Care | Payer: Medicare Other | Source: Home / Self Care | Attending: Emergency Medicine | Admitting: Emergency Medicine

## 2012-08-29 ENCOUNTER — Emergency Department (HOSPITAL_COMMUNITY): Payer: Medicare Other

## 2012-08-29 DIAGNOSIS — K8020 Calculus of gallbladder without cholecystitis without obstruction: Secondary | ICD-10-CM

## 2012-08-29 DIAGNOSIS — R112 Nausea with vomiting, unspecified: Secondary | ICD-10-CM | POA: Diagnosis not present

## 2012-08-29 DIAGNOSIS — R109 Unspecified abdominal pain: Secondary | ICD-10-CM

## 2012-08-29 DIAGNOSIS — R1011 Right upper quadrant pain: Secondary | ICD-10-CM | POA: Insufficient documentation

## 2012-08-29 DIAGNOSIS — Z79899 Other long term (current) drug therapy: Secondary | ICD-10-CM | POA: Insufficient documentation

## 2012-08-29 DIAGNOSIS — Z6841 Body Mass Index (BMI) 40.0 and over, adult: Secondary | ICD-10-CM | POA: Insufficient documentation

## 2012-08-29 DIAGNOSIS — I4891 Unspecified atrial fibrillation: Secondary | ICD-10-CM | POA: Diagnosis not present

## 2012-08-29 DIAGNOSIS — K219 Gastro-esophageal reflux disease without esophagitis: Secondary | ICD-10-CM | POA: Insufficient documentation

## 2012-08-29 LAB — URINE MICROSCOPIC-ADD ON

## 2012-08-29 LAB — COMPREHENSIVE METABOLIC PANEL
ALT: 31 U/L (ref 0–35)
AST: 29 U/L (ref 0–37)
Albumin: 3.6 g/dL (ref 3.5–5.2)
Alkaline Phosphatase: 101 U/L (ref 39–117)
Calcium: 9.4 mg/dL (ref 8.4–10.5)
Potassium: 3.9 mEq/L (ref 3.5–5.1)
Sodium: 138 mEq/L (ref 135–145)
Total Protein: 7.3 g/dL (ref 6.0–8.3)

## 2012-08-29 LAB — CBC WITH DIFFERENTIAL/PLATELET
Basophils Relative: 1 % (ref 0–1)
Eosinophils Absolute: 0.3 10*3/uL (ref 0.0–0.7)
Hemoglobin: 12.5 g/dL (ref 12.0–15.0)
Lymphs Abs: 1.2 10*3/uL (ref 0.7–4.0)
MCH: 27.6 pg (ref 26.0–34.0)
MCHC: 31.7 g/dL (ref 30.0–36.0)
Neutro Abs: 6.6 10*3/uL (ref 1.7–7.7)
Neutrophils Relative %: 76 % (ref 43–77)
Platelets: 256 10*3/uL (ref 150–400)
RBC: 4.53 MIL/uL (ref 3.87–5.11)

## 2012-08-29 LAB — LIPASE, BLOOD: Lipase: 38 U/L (ref 11–59)

## 2012-08-29 LAB — URINALYSIS, ROUTINE W REFLEX MICROSCOPIC
Ketones, ur: NEGATIVE mg/dL
Nitrite: NEGATIVE
Specific Gravity, Urine: 1.02 (ref 1.005–1.030)
pH: 6.5 (ref 5.0–8.0)

## 2012-08-29 MED ORDER — ONDANSETRON 8 MG PO TBDP
8.0000 mg | ORAL_TABLET | Freq: Once | ORAL | Status: AC
  Administered 2012-08-29: 8 mg via ORAL
  Filled 2012-08-29: qty 1

## 2012-08-29 MED ORDER — HYDROMORPHONE HCL PF 1 MG/ML IJ SOLN
1.0000 mg | Freq: Once | INTRAMUSCULAR | Status: AC
  Administered 2012-08-29: 1 mg via INTRAVENOUS

## 2012-08-29 MED ORDER — HYDROMORPHONE HCL PF 1 MG/ML IJ SOLN
INTRAMUSCULAR | Status: AC
  Administered 2012-08-29: 1 mg via INTRAVENOUS
  Filled 2012-08-29: qty 1

## 2012-08-29 MED ORDER — ONDANSETRON 8 MG PO TBDP: 8.0000 mg | ORAL_TABLET | Freq: Once | ORAL | Status: DC

## 2012-08-29 MED ORDER — ONDANSETRON HCL 4 MG/2ML IJ SOLN
INTRAMUSCULAR | Status: AC
  Administered 2012-08-29: 4 mg via INTRAVENOUS
  Filled 2012-08-29: qty 2

## 2012-08-29 MED ORDER — OXYCODONE-ACETAMINOPHEN 5-325 MG PO TABS
1.0000 | ORAL_TABLET | Freq: Once | ORAL | Status: AC
  Administered 2012-08-29: 1 via ORAL
  Filled 2012-08-29: qty 1

## 2012-08-29 MED ORDER — SODIUM CHLORIDE 0.9 % IV SOLN
INTRAVENOUS | Status: DC
  Administered 2012-08-29: 125 mL/h via INTRAVENOUS

## 2012-08-29 MED ORDER — OXYCODONE-ACETAMINOPHEN 5-325 MG PO TABS: 1.0000 | ORAL_TABLET | ORAL | Status: DC | PRN

## 2012-08-29 MED ORDER — ONDANSETRON HCL 4 MG/2ML IJ SOLN
4.0000 mg | Freq: Once | INTRAMUSCULAR | Status: AC
  Administered 2012-08-29: 4 mg via INTRAVENOUS

## 2012-08-29 MED ORDER — SODIUM CHLORIDE 0.9 % IV BOLUS (SEPSIS)
1000.0000 mL | Freq: Once | INTRAVENOUS | Status: AC
  Administered 2012-08-29: 1000 mL via INTRAVENOUS

## 2012-08-29 NOTE — ED Notes (Signed)
Patient began w/stomach upset this AM.  Had pizza for dinner last PM and sips of coffee this AM.  Stomach pain progressed quickly to 10/10, sharp, over R abdomen.  She has had GB stones in past, but has not had cholesystectomy.  She reports bright red blood in stool this AM.  She does have internal hemorrhoids.  Stool was formed, w/some clay colored and some brown.  Pt is writhing on the stretcher, crying.

## 2012-08-29 NOTE — ED Notes (Signed)
abd pain, N/v 

## 2012-08-29 NOTE — ED Provider Notes (Signed)
History  This chart was scribed for Jennifer Melter, MD by Bennett Scrape. This patient was seen in room APA11/[facility]11 and the patient's care was started at 11:56AM.  CSN: [id number]  Arrival date & time 08/29/12  1052   First MD Initiated Contact with Patient 08/29/12 1156      Chief Complaint  Patient presents with  . Abdominal Pain     The history is provided by the patient. No language interpreter was used.    [name] is a 71 y.o. female who presents to the Emergency Department complaining of sudden onset, gradually worsening, constant RUQ abdominal pain described as aching that radiates to her upper right back with associated nausea and non-bloody emesis that she woke up with this morning. She reports that she has chronic gallbladder problems, her last visit to this ED for gallbladder being in December, but has been eating a plant based diet with improvement in her symptoms. She denies similarities between the two stating that this pain is more severe. She denies fever, CP, diarrhea, HA and rash as associated symptoms.She takes lanoxin and pradaxa daily for A. Fib. She has a h/o GERD, fibromyalgia, asthma and anxiety. She is a former smoker but denies alcohol use.  Dr. Mirna Mires is PCP and Dr. Jenelle Mages is Cardiologist.  Past Medical History  Diagnosis Date  . GERD (gastroesophageal reflux disease)   . Fibromyalgia   . Anxiety   . Depression   . Obesity, morbid (more than 100 lbs over ideal weight or BMI > 40)   . Hyperlipemia   . Hypothyroidism   . Asthma   . Obstructive sleep apnea on CPAP and O2  . Migraine   . Colon polyp     TCS 2005 TICS [place], TCS/PROPOFOL 2011 SIMPLE [place]  . Allergic rhinitis   . Osteoarthritis   . Palpitations   . Narcolepsy   . Cholecystitis   . Afib     Past Surgical History  Procedure Date  . Abdominal hysterectomy FIBROIDS 1982  . Rhinoplasty   . Bladder suspension 1982  . Kidney stent 1995  . Cardioversion 02/05/2012   Procedure: CARDIOVERSION;  Surgeon: Pamella Pert, MD;  Location: [facility] OR;  Service: Cardiovascular;  Laterality: N/A;    History reviewed. No pertinent family history.  History  Substance Use Topics  . Smoking status: Former Games developer  . Smokeless tobacco: Never Used   Comment: 22 years ago  . Alcohol Use: No    No OB history provided.  Review of Systems  A complete 10 system review of systems was obtained and all systems are negative except as noted in the HPI and PMH.   Allergies  Atorvastatin; Meperidine hcl; and Zocor  Home Medications   Current Outpatient Rx  Name Route Sig Dispense Refill  . ALBUTEROL 90 MCG/ACT IN AERS Inhalation Inhale 2 puffs into the lungs every 6 (six) hours as needed. For shortness of breath    . DABIGATRAN ETEXILATE MESYLATE 150 MG PO CAPS Oral Take 150 mg by mouth every 12 (twelve) hours.    Marland Kitchen DIGOXIN 0.25 MG PO TABS Oral Take 250 mcg by mouth daily.    Marland Kitchen ESOMEPRAZOLE MAGNESIUM 40 MG PO CPDR Oral Take 40 mg by mouth daily before breakfast.      . FLUOXETINE HCL 20 MG PO CAPS Oral Take 60 mg by mouth daily.      Marland Kitchen FLUTICASONE PROPIONATE 50 MCG/ACT NA SUSP Nasal Place 2 sprays into the nose daily as  needed. For allergies    . LEVOTHYROXINE SODIUM 50 MCG PO TABS Oral Take 50 mcg by mouth daily.      Marland Kitchen LORAZEPAM 1 MG PO TABS Oral Take 1.5 mg by mouth every 8 (eight) hours.     Marland Kitchen MICONAZOLE NITRATE 2 % VA CREA Vaginal Place 1 applicator vaginally at bedtime as needed. Yeast infection    . POTASSIUM CHLORIDE 10 MEQ PO TBCR Oral Take 10 mEq by mouth daily as needed. Only takes when takes Torsemide for swelling    . PROPRANOLOL HCL ER BEADS 80 MG PO CP24 Oral Take 80 mg by mouth every morning.     . TORSEMIDE 20 MG PO TABS Oral Take 20 mg by mouth daily as needed. For swelling    . ONDANSETRON 8 MG PO TBDP Oral Take 1 tablet (8 mg total) by mouth once. 20 tablet 0  . OXYCODONE-ACETAMINOPHEN 5-325 MG PO TABS Oral Take 1 tablet by mouth every 4 (four) hours  as needed for pain. 30 tablet 0    Triage Vitals: BP 107/52  Pulse 60  Temp 97.4 F (36.3 C) (Oral)  Resp 18  Ht 5\' 2"  (1.575 m)  Wt 280 lb (127.007 kg)  BMI 51.21 kg/m2  SpO2 99%  Physical Exam  Nursing note and vitals reviewed. Constitutional: She is oriented to person, place, and time. She appears well-developed and well-nourished.  HENT:  Head: Normocephalic and atraumatic.  Eyes: Conjunctivae normal and EOM are normal. Pupils are equal, round, and reactive to light.  Neck: Normal range of motion and phonation normal. Neck supple.  Cardiovascular: Normal rate, regular rhythm and intact distal pulses.   Pulmonary/Chest: Effort normal and breath sounds normal. She exhibits no tenderness.  Abdominal: Soft. She exhibits no distension. There is tenderness (RUQ). There is no guarding.       Hypoactive bowel sounds  Musculoskeletal: Normal range of motion.  Neurological: She is alert and oriented to person, place, and time. She has normal strength. She exhibits normal muscle tone.  Skin: Skin is warm and dry.  Psychiatric: She has a normal mood and affect. Her behavior is normal. Judgment and thought content normal.    ED Course  Procedures (including critical care time)  DIAGNOSTIC STUDIES: Oxygen Saturation is 99% on room air, normal by my interpretation.    COORDINATION OF CARE: 12:00PM-Discussed treatment plan with pt at bedside and pt agreed to plan.  12:01PM-Ordered US of abdomen, UA, CBC, lipase, and metabolic panel.  12:15PM-Ordered 1 mg dilaudid, 4 mg Zofran and 1000 mL Bolus.'  2:00Pm-Ordered 1 mg Dilaudid.  2:40PM-Consult complete with Dr. Lovell Sheehan. Patient case explained and discussed. Dr. Lovell Sheehan agrees to see the pt in his office for further evaluation and treatment. He schedule an appointment for 09/02/12 at 1:45PM. Call ended at 2:42PM.  2:53PM-Pt rechecked and feels improved. Informed pt of US showing gallbladder stones and of my consult with Dr. Lovell Sheehan.  Advised pt to stay away from fatty foods and stop the pradaxa in order to have surgery in 5 days. Discussed discharge plan with pt at bedside and pt agreed to plan. Addressed pt's concerns over the surgery and Dr. Lovell Sheehan.  Labs Reviewed  URINALYSIS, ROUTINE W REFLEX MICROSCOPIC - Abnormal; Notable for the following:    APPearance HAZY (*)     Hgb urine dipstick SMALL (*)     All other components within normal limits  COMPREHENSIVE METABOLIC PANEL - Abnormal; Notable for the following:    Glucose, Bld 133 (*)  GFR calc non Af Amer 86 (*)     All other components within normal limits  URINE MICROSCOPIC-ADD ON - Abnormal; Notable for the following:    Squamous Epithelial / LPF MANY (*)     Bacteria, UA FEW (*)     All other components within normal limits  CBC WITH DIFFERENTIAL  LIPASE, BLOOD  URINE CULTURE   US Abdomen Complete  08/29/2012  *RADIOLOGY REPORT*  Clinical Data:  Abdominal pain.  COMPLETE ABDOMINAL ULTRASOUND  Comparison:  MRI 02/14/2011  Findings:  Gallbladder:  There are echogenic foci at the base of the gallbladder with posterior acoustic shadowing.  Findings consistent with multiple stones.  No evidence for gallbladder wall thickening. The patient does not have a sonographic Murphy's sign.  Common bile duct:  Measures 0.7 cm.  Liver:  The liver is heterogeneous and hyperechoic.  Liver measures up to an 19.7 cm in length.  Main portal vein is patent. There appears to be a focal area of fatty sparing adjacent to the gallbladder.  IVC:  Appears normal.  Pancreas:  No focal abnormality seen. Limited evaluation of pancreatic tail.  Spleen:  Spleen measures 11.5 cm in length.  Right Kidney:  Right kidney measures 12.6 cm in length without hydronephrosis.  Normal renal echotexture.  Left Kidney:  Left kidney measures 13.4 cm in length without hydronephrosis.  Abdominal aorta:  No aneurysm identified.  IMPRESSION: Cholelithiasis.  Negative for sonographic Murphy's sign.  Common bile  duct is slightly prominent.  The liver is diffusely echogenic and heterogeneous.  Findings are compatible with hepatic steatosis.   Original Report Authenticated By: Richarda Overlie, M.D.      1. Abdominal pain, recurrent   2. Cholelithiases       MDM  Abdominal pain, with gallbladder disease. Doubt cholecystitis or significant acute bacterial infection. Doubt metabolic instability, serious bacterial infection or impending vascular collapse; the patient is stable for discharge.      Plan: Home Medications- Percocet; Home Treatments- low fat diet; Recommended follow up- with Dr. Lovell Sheehan on 09/02/12 at 1:45PM  I personally performed the services described in this documentation, which was scribed in my presence. The recorded information has been reviewed and considered.         Jennifer Melter, MD 08/29/12 [phone]

## 2012-08-31 ENCOUNTER — Inpatient Hospital Stay (HOSPITAL_COMMUNITY)
Admission: EM | Admit: 2012-08-31 | Discharge: 2012-09-04 | DRG: 418 | Disposition: A | Discharge status: Home / Self Care | Payer: Medicare Other | Attending: General Surgery | Admitting: General Surgery

## 2012-08-31 ENCOUNTER — Encounter (HOSPITAL_COMMUNITY): Payer: Self-pay

## 2012-08-31 DIAGNOSIS — E039 Hypothyroidism, unspecified: Secondary | ICD-10-CM | POA: Diagnosis present

## 2012-08-31 DIAGNOSIS — E785 Hyperlipidemia, unspecified: Secondary | ICD-10-CM | POA: Diagnosis present

## 2012-08-31 DIAGNOSIS — Z87891 Personal history of nicotine dependence: Secondary | ICD-10-CM | POA: Diagnosis not present

## 2012-08-31 DIAGNOSIS — Z8719 Personal history of other diseases of the digestive system: Secondary | ICD-10-CM

## 2012-08-31 DIAGNOSIS — K8010 Calculus of gallbladder with chronic cholecystitis without obstruction: Principal | ICD-10-CM | POA: Diagnosis present

## 2012-08-31 DIAGNOSIS — K219 Gastro-esophageal reflux disease without esophagitis: Secondary | ICD-10-CM | POA: Diagnosis present

## 2012-08-31 DIAGNOSIS — K8000 Calculus of gallbladder with acute cholecystitis without obstruction: Secondary | ICD-10-CM | POA: Diagnosis not present

## 2012-08-31 DIAGNOSIS — Z6841 Body Mass Index (BMI) 40.0 and over, adult: Secondary | ICD-10-CM | POA: Diagnosis not present

## 2012-08-31 DIAGNOSIS — K819 Cholecystitis, unspecified: Secondary | ICD-10-CM | POA: Diagnosis not present

## 2012-08-31 DIAGNOSIS — G4733 Obstructive sleep apnea (adult) (pediatric): Secondary | ICD-10-CM | POA: Diagnosis present

## 2012-08-31 DIAGNOSIS — J4489 Other specified chronic obstructive pulmonary disease: Secondary | ICD-10-CM | POA: Diagnosis present

## 2012-08-31 DIAGNOSIS — F411 Generalized anxiety disorder: Secondary | ICD-10-CM | POA: Diagnosis present

## 2012-08-31 DIAGNOSIS — R7309 Other abnormal glucose: Secondary | ICD-10-CM | POA: Diagnosis present

## 2012-08-31 DIAGNOSIS — Z79899 Other long term (current) drug therapy: Secondary | ICD-10-CM | POA: Diagnosis not present

## 2012-08-31 DIAGNOSIS — IMO0001 Reserved for inherently not codable concepts without codable children: Secondary | ICD-10-CM | POA: Diagnosis present

## 2012-08-31 DIAGNOSIS — E662 Morbid (severe) obesity with alveolar hypoventilation: Secondary | ICD-10-CM | POA: Diagnosis present

## 2012-08-31 DIAGNOSIS — I509 Heart failure, unspecified: Secondary | ICD-10-CM | POA: Diagnosis not present

## 2012-08-31 DIAGNOSIS — K8020 Calculus of gallbladder without cholecystitis without obstruction: Secondary | ICD-10-CM

## 2012-08-31 DIAGNOSIS — Z860100 Personal history of colon polyps, unspecified: Secondary | ICD-10-CM

## 2012-08-31 DIAGNOSIS — Z888 Allergy status to other drugs, medicaments and biological substances status: Secondary | ICD-10-CM

## 2012-08-31 DIAGNOSIS — I4891 Unspecified atrial fibrillation: Secondary | ICD-10-CM

## 2012-08-31 DIAGNOSIS — I2789 Other specified pulmonary heart diseases: Secondary | ICD-10-CM | POA: Diagnosis present

## 2012-08-31 DIAGNOSIS — G47419 Narcolepsy without cataplexy: Secondary | ICD-10-CM | POA: Diagnosis present

## 2012-08-31 DIAGNOSIS — F329 Major depressive disorder, single episode, unspecified: Secondary | ICD-10-CM | POA: Diagnosis present

## 2012-08-31 DIAGNOSIS — Z23 Encounter for immunization: Secondary | ICD-10-CM

## 2012-08-31 DIAGNOSIS — K8050 Calculus of bile duct without cholangitis or cholecystitis without obstruction: Secondary | ICD-10-CM

## 2012-08-31 DIAGNOSIS — Z90710 Acquired absence of both cervix and uterus: Secondary | ICD-10-CM | POA: Diagnosis not present

## 2012-08-31 DIAGNOSIS — J449 Chronic obstructive pulmonary disease, unspecified: Secondary | ICD-10-CM | POA: Diagnosis present

## 2012-08-31 DIAGNOSIS — R1011 Right upper quadrant pain: Secondary | ICD-10-CM | POA: Diagnosis not present

## 2012-08-31 DIAGNOSIS — M1990 Unspecified osteoarthritis, unspecified site: Secondary | ICD-10-CM | POA: Diagnosis present

## 2012-08-31 DIAGNOSIS — I5030 Unspecified diastolic (congestive) heart failure: Secondary | ICD-10-CM

## 2012-08-31 DIAGNOSIS — K7689 Other specified diseases of liver: Secondary | ICD-10-CM | POA: Diagnosis present

## 2012-08-31 DIAGNOSIS — F3289 Other specified depressive episodes: Secondary | ICD-10-CM | POA: Diagnosis present

## 2012-08-31 DIAGNOSIS — Z7902 Long term (current) use of antithrombotics/antiplatelets: Secondary | ICD-10-CM

## 2012-08-31 HISTORY — DX: Fatty (change of) liver, not elsewhere classified: K76.0

## 2012-08-31 HISTORY — DX: Gout, unspecified: M10.9

## 2012-08-31 HISTORY — DX: Fluid overload, unspecified: E87.70

## 2012-08-31 HISTORY — DX: Other hemorrhoids: K64.8

## 2012-08-31 HISTORY — DX: Diverticulosis of intestine, part unspecified, without perforation or abscess without bleeding: K57.90

## 2012-08-31 LAB — COMPREHENSIVE METABOLIC PANEL
AST: 26 U/L (ref 0–37)
CO2: 27 mEq/L (ref 19–32)
Chloride: 101 mEq/L (ref 96–112)
Creatinine, Ser: 0.68 mg/dL (ref 0.50–1.10)
GFR calc Af Amer: >90 mL/min (ref >90)
GFR calc non Af Amer: 87 mL/min — ABNORMAL LOW (ref >90)
Glucose, Bld: 135 mg/dL — ABNORMAL HIGH (ref 70–99)
Total Bilirubin: 0.5 mg/dL (ref 0.3–1.2)

## 2012-08-31 LAB — CBC WITH DIFFERENTIAL/PLATELET
Basophils Absolute: 0 10*3/uL (ref 0.0–0.1)
HCT: 39.1 % (ref 36.0–46.0)
Hemoglobin: 12.3 g/dL (ref 12.0–15.0)
Lymphocytes Relative: 14 % (ref 12–46)
Lymphs Abs: 1.2 10*3/uL (ref 0.7–4.0)
MCV: 88.9 fL (ref 78.0–100.0)
Monocytes Absolute: 0.9 10*3/uL (ref 0.1–1.0)
Monocytes Relative: 11 % (ref 3–12)
Neutro Abs: 6.2 10*3/uL (ref 1.7–7.7)
RBC: 4.4 MIL/uL (ref 3.87–5.11)
RDW: 13.9 % (ref 11.5–15.5)
WBC: 8.5 10*3/uL (ref 4.0–10.5)

## 2012-08-31 LAB — URINE CULTURE: Colony Count: 8000

## 2012-08-31 LAB — URINALYSIS, ROUTINE W REFLEX MICROSCOPIC
Glucose, UA: NEGATIVE mg/dL
Hgb urine dipstick: NEGATIVE
Ketones, ur: NEGATIVE mg/dL
Protein, ur: NEGATIVE mg/dL

## 2012-08-31 LAB — PROTIME-INR: INR: 1.18 (ref 0.00–1.49)

## 2012-08-31 MED ORDER — FLUOXETINE HCL 20 MG PO CAPS
60.0000 mg | ORAL_CAPSULE | Freq: Every day | ORAL | Status: DC
  Administered 2012-08-31 – 2012-09-04 (×5): 60 mg via ORAL
  Filled 2012-08-31 (×5): qty 3

## 2012-08-31 MED ORDER — PROPRANOLOL HCL ER BEADS 80 MG PO CP24: 80.0000 mg | ORAL_CAPSULE | Freq: Every morning | ORAL | Status: DC

## 2012-08-31 MED ORDER — DIPHENHYDRAMINE HCL 12.5 MG/5ML PO ELIX: 12.5000 mg | ORAL_SOLUTION | Freq: Four times a day (QID) | ORAL | Status: DC | PRN

## 2012-08-31 MED ORDER — SODIUM CHLORIDE 0.9 % IV SOLN: INTRAVENOUS | Status: DC

## 2012-08-31 MED ORDER — HYDROMORPHONE HCL PF 1 MG/ML IJ SOLN
1.0000 mg | Freq: Once | INTRAMUSCULAR | Status: AC
  Administered 2012-08-31: 1 mg via INTRAVENOUS
  Filled 2012-08-31: qty 1

## 2012-08-31 MED ORDER — LORAZEPAM 1 MG PO TABS
1.5000 mg | ORAL_TABLET | Freq: Three times a day (TID) | ORAL | Status: DC
  Administered 2012-08-31 – 2012-09-04 (×12): 1.5 mg via ORAL
  Filled 2012-08-31 (×13): qty 1

## 2012-08-31 MED ORDER — ONDANSETRON HCL 4 MG/2ML IJ SOLN
INTRAMUSCULAR | Status: AC
  Administered 2012-08-31: 4 mg via INTRAVENOUS
  Filled 2012-08-31: qty 2

## 2012-08-31 MED ORDER — OXYCODONE-ACETAMINOPHEN 5-325 MG PO TABS
1.0000 | ORAL_TABLET | ORAL | Status: DC | PRN
  Administered 2012-09-03: 1 via ORAL
  Filled 2012-08-31: qty 1

## 2012-08-31 MED ORDER — ONDANSETRON HCL 4 MG/2ML IJ SOLN
4.0000 mg | Freq: Three times a day (TID) | INTRAMUSCULAR | Status: DC | PRN
  Administered 2012-08-31: 4 mg via INTRAVENOUS

## 2012-08-31 MED ORDER — ACETAMINOPHEN 650 MG RE SUPP: 650.0000 mg | Freq: Four times a day (QID) | RECTAL | Status: DC | PRN

## 2012-08-31 MED ORDER — DIGOXIN 250 MCG PO TABS
250.0000 ug | ORAL_TABLET | Freq: Every day | ORAL | Status: DC
  Administered 2012-08-31 – 2012-09-04 (×5): 250 ug via ORAL
  Filled 2012-08-31 (×5): qty 1

## 2012-08-31 MED ORDER — INFLUENZA VIRUS VACC SPLIT PF IM SUSP
0.5000 mL | INTRAMUSCULAR | Status: AC
  Filled 2012-08-31: qty 0.5

## 2012-08-31 MED ORDER — HYDROMORPHONE HCL PF 1 MG/ML IJ SOLN
1.0000 mg | INTRAMUSCULAR | Status: DC | PRN
  Administered 2012-08-31 – 2012-09-04 (×14): 1 mg via INTRAVENOUS
  Filled 2012-08-31 (×14): qty 1

## 2012-08-31 MED ORDER — PROPRANOLOL HCL ER 80 MG PO CP24
80.0000 mg | ORAL_CAPSULE | Freq: Every day | ORAL | Status: DC
  Administered 2012-08-31 – 2012-09-04 (×3): 80 mg via ORAL
  Filled 2012-08-31 (×6): qty 1

## 2012-08-31 MED ORDER — ONDANSETRON HCL 4 MG/2ML IJ SOLN: 4.0000 mg | Freq: Four times a day (QID) | INTRAMUSCULAR | Status: DC | PRN

## 2012-08-31 MED ORDER — DIPHENHYDRAMINE HCL 50 MG/ML IJ SOLN: 12.5000 mg | Freq: Four times a day (QID) | INTRAMUSCULAR | Status: DC | PRN

## 2012-08-31 MED ORDER — KCL IN DEXTROSE-NACL 20-5-0.45 MEQ/L-%-% IV SOLN
INTRAVENOUS | Status: DC
  Administered 2012-08-31 – 2012-09-02 (×4): via INTRAVENOUS

## 2012-08-31 MED ORDER — LEVOTHYROXINE SODIUM 50 MCG PO TABS
50.0000 ug | ORAL_TABLET | Freq: Every day | ORAL | Status: DC
  Administered 2012-08-31 – 2012-09-04 (×5): 50 ug via ORAL
  Filled 2012-08-31 (×5): qty 1

## 2012-08-31 MED ORDER — ACETAMINOPHEN 325 MG PO TABS: 650.0000 mg | ORAL_TABLET | Freq: Four times a day (QID) | ORAL | Status: DC | PRN

## 2012-08-31 MED ORDER — ENOXAPARIN SODIUM 40 MG/0.4ML ~~LOC~~ SOLN
40.0000 mg | SUBCUTANEOUS | Status: DC
  Administered 2012-09-01 – 2012-09-03 (×3): 40 mg via SUBCUTANEOUS
  Filled 2012-08-31 (×3): qty 0.4

## 2012-08-31 MED ORDER — HYDROMORPHONE HCL PF 1 MG/ML IJ SOLN: 1.0000 mg | INTRAMUSCULAR | Status: DC | PRN

## 2012-08-31 NOTE — ED Notes (Signed)
They did an ultrasound on Friday and I have gallstones per pt. Given percocet and it does not relieve the pain.

## 2012-08-31 NOTE — ED Notes (Signed)
Report given to [place], RN [address]. Ready to receive patient.

## 2012-08-31 NOTE — ED Notes (Signed)
Patient is comfortable at this time. Family at bedside.

## 2012-08-31 NOTE — ED Notes (Signed)
Having abdominal pain and back pain per pt. Was seen for the same on Friday per pt. Pain has been off and on per pt.

## 2012-08-31 NOTE — H&P (Signed)
[name] is an 71 y.o. female.   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 Ultrasound recently was performed which revealed multiple gallstones. She was seen by another surgeon who stated that he could not do her surgery do to her obesity. She presented emergency room and is ongoing right upper quadrant abdominal pain and nausea.  Past Medical History  Diagnosis Date  . GERD (gastroesophageal reflux disease)   . Fibromyalgia   . Anxiety   . Depression   . Obesity, morbid (more than 100 lbs over ideal weight or BMI > 40)   . Hyperlipemia   . Hypothyroidism   . Asthma   . Obstructive sleep apnea on CPAP and O2  . Migraine   . Colon polyp     TCS 2005 TICS [place], TCS/PROPOFOL 2011 SIMPLE [place]  . Allergic rhinitis   . Osteoarthritis   . Palpitations   . Narcolepsy   . Cholecystitis   . Afib     Past Surgical History  Procedure Date  . Abdominal hysterectomy FIBROIDS 1982  . Rhinoplasty   . Bladder suspension 1982  . Kidney stent 1995  . Cardioversion 02/05/2012    Procedure: CARDIOVERSION;  Surgeon: Pamella Pert, MD;  Location: [facility] OR;  Service: Cardiovascular;  Laterality: N/A;    History reviewed. No pertinent family history. Social History:  reports that she has quit smoking. She has never used smokeless tobacco. She reports that she does not drink alcohol or use illicit drugs.  Allergies:  Allergies  Allergen Reactions  . Atorvastatin Other (See Comments)    Muscle sorness  . Meperidine Hcl Other (See Comments)    Drops BP  . Zocor (Simvastatin - High Dose) Other (See Comments)    Elevated liver enzymes    Medications Prior to Admission  Medication Sig Dispense Refill  . albuterol (PROVENTIL,VENTOLIN) 90 MCG/ACT inhaler Inhale 2 puffs into  the lungs every 6 (six) hours as needed. For shortness of breath      . dabigatran (PRADAXA) 150 MG CAPS Take 150 mg by mouth every 12 (twelve) hours.      . digoxin (LANOXIN) 0.25 MG tablet Take 250 mcg by mouth daily.      Marland Kitchen esomeprazole (NEXIUM) 40 MG capsule Take 40 mg by mouth daily before breakfast.        . FLUoxetine (PROZAC) 20 MG capsule Take 60 mg by mouth daily.        . fluticasone (FLONASE) 50 MCG/ACT nasal spray Place 2 sprays into the nose daily as needed. For allergies      . levothyroxine (SYNTHROID, LEVOTHROID) 50 MCG tablet Take 50 mcg by mouth daily.        Marland Kitchen LORazepam (ATIVAN) 1 MG tablet Take 1.5 mg by mouth every 8 (eight) hours.       . miconazole (MONISTAT 7) 2 % vaginal cream Place 1 applicator vaginally at bedtime as needed. Yeast infection      . ondansetron (ZOFRAN-ODT) 8 MG disintegrating tablet Take 1 tablet (8 mg total) by mouth once.  20 tablet  0  . oxyCODONE-acetaminophen (PERCOCET/ROXICET) 5-325 MG per tablet Take 1 tablet by mouth every 4 (four) hours as needed for pain.  30 tablet  0  . potassium chloride (KLOR-CON) 10 MEQ CR tablet Take 10 mEq by mouth daily as needed.  Only takes when takes Torsemide for swelling      . propranolol (INNOPRAN XL) 80 MG 24 hr capsule Take 80 mg by mouth every morning.       . torsemide (DEMADEX) 20 MG tablet Take 20 mg by mouth daily as needed. For swelling        Results for orders placed during the hospital encounter of 08/31/12 (from the past 48 hour(s))  CBC WITH DIFFERENTIAL     Status: Normal   Collection Time   08/31/12  6:14 AM      Component Value Range Comment   WBC 8.5  4.0 - 10.5 K/uL    RBC 4.40  3.87 - 5.11 MIL/uL    Hemoglobin 12.3  12.0 - 15.0 g/dL    HCT 16.1  09.6 - 04.5 %    MCV 88.9  78.0 - 100.0 fL    MCH 28.0  26.0 - 34.0 pg    MCHC 31.5  30.0 - 36.0 g/dL    RDW 40.9  81.1 - 91.4 %    Platelets 254  150 - 400 K/uL    Neutrophils Relative 73  43 - 77 %    Neutro Abs 6.2  1.7 - 7.7 K/uL     Lymphocytes Relative 14  12 - 46 %    Lymphs Abs 1.2  0.7 - 4.0 K/uL    Monocytes Relative 11  3 - 12 %    Monocytes Absolute 0.9  0.1 - 1.0 K/uL    Eosinophils Relative 3  0 - 5 %    Eosinophils Absolute 0.2  0.0 - 0.7 K/uL    Basophils Relative 1  0 - 1 %    Basophils Absolute 0.0  0.0 - 0.1 K/uL   COMPREHENSIVE METABOLIC PANEL     Status: Abnormal   Collection Time   08/31/12  6:14 AM      Component Value Range Comment   Sodium 136  135 - 145 mEq/L    Potassium 3.8  3.5 - 5.1 mEq/L    Chloride 101  96 - 112 mEq/L    CO2 27  19 - 32 mEq/L    Glucose, Bld 135 (*) 70 - 99 mg/dL    BUN 6  6 - 23 mg/dL    Creatinine, Ser 7.82  0.50 - 1.10 mg/dL    Calcium 8.9  8.4 - 95.6 mg/dL    Total Protein 7.0  6.0 - 8.3 g/dL    Albumin 3.4 (*) 3.5 - 5.2 g/dL    AST 26  0 - 37 U/L    ALT 29  0 - 35 U/L    Alkaline Phosphatase 94  39 - 117 U/L    Total Bilirubin 0.5  0.3 - 1.2 mg/dL    GFR calc non Af Amer 87 (*) >90 mL/min    GFR calc Af Amer >90  >90 mL/min   LIPASE, BLOOD     Status: Normal   Collection Time   08/31/12  6:14 AM      Component Value Range Comment   Lipase 24  11 - 59 U/L   DIGOXIN LEVEL     Status: Normal   Collection Time   08/31/12  6:14 AM      Component Value Range Comment   Digoxin Level 0.9  0.8 - 2.0 ng/mL   APTT     Status: Abnormal   Collection Time   08/31/12  6:14 AM      Component  Value Range Comment   aPTT 55 (*) 24 - 37 seconds   PROTIME-INR     Status: Normal   Collection Time   08/31/12  6:14 AM      Component Value Range Comment   Prothrombin Time 14.8  11.6 - 15.2 seconds    INR 1.18  0.00 - 1.49   URINALYSIS, ROUTINE W REFLEX MICROSCOPIC     Status: Abnormal   Collection Time   08/31/12  6:38 AM      Component Value Range Comment   Color, Urine YELLOW  YELLOW    APPearance CLEAR  CLEAR    Specific Gravity, Urine <1.005 (*) 1.005 - 1.030    pH 6.5  5.0 - 8.0    Glucose, UA NEGATIVE  NEGATIVE mg/dL    Hgb urine dipstick NEGATIVE  NEGATIVE     Bilirubin Urine NEGATIVE  NEGATIVE    Ketones, ur NEGATIVE  NEGATIVE mg/dL    Protein, ur NEGATIVE  NEGATIVE mg/dL    Urobilinogen, UA 0.2  0.0 - 1.0 mg/dL    Nitrite NEGATIVE  NEGATIVE    Leukocytes, UA NEGATIVE  NEGATIVE MICROSCOPIC NOT DONE ON URINES WITH NEGATIVE PROTEIN, BLOOD, LEUKOCYTES, NITRITE, OR GLUCOSE <1000 mg/dL.   US Abdomen Complete  08/29/2012  *RADIOLOGY REPORT*  Clinical Data:  Abdominal pain.  COMPLETE ABDOMINAL ULTRASOUND  Comparison:  MRI 02/14/2011  Findings:  Gallbladder:  There are echogenic foci at the base of the gallbladder with posterior acoustic shadowing.  Findings consistent with multiple stones.  No evidence for gallbladder wall thickening. The patient does not have a sonographic Murphy's sign.  Common bile duct:  Measures 0.7 cm.  Liver:  The liver is heterogeneous and hyperechoic.  Liver measures up to an 19.7 cm in length.  Main portal vein is patent. There appears to be a focal area of fatty sparing adjacent to the gallbladder.  IVC:  Appears normal.  Pancreas:  No focal abnormality seen. Limited evaluation of pancreatic tail.  Spleen:  Spleen measures 11.5 cm in length.  Right Kidney:  Right kidney measures 12.6 cm in length without hydronephrosis.  Normal renal echotexture.  Left Kidney:  Left kidney measures 13.4 cm in length without hydronephrosis.  Abdominal aorta:  No aneurysm identified.  IMPRESSION: Cholelithiasis.  Negative for sonographic Murphy's sign.  Common bile duct is slightly prominent.  The liver is diffusely echogenic and heterogeneous.  Findings are compatible with hepatic steatosis.   Original Report Authenticated By: Richarda Overlie, M.D.     Review of Systems  Constitutional: Positive for malaise/fatigue.  HENT: Negative.   Eyes: Negative.   Respiratory: Positive for shortness of breath.   Cardiovascular: Positive for palpitations and leg swelling.  Gastrointestinal: Positive for heartburn, nausea, vomiting and abdominal pain.  Genitourinary:  Negative.   Musculoskeletal: Positive for myalgias.  Skin: Negative.   Neurological: Positive for weakness.  Endo/Heme/Allergies: Negative.     Blood pressure 130/76, pulse 69, temperature 97.9 F (36.6 C), temperature source Oral, resp. rate 18, height 5\' 2"  (1.575 m), weight 132.7 kg (292 lb 8.8 oz), SpO2 95.00%. Physical Exam  Constitutional: She is oriented to person, place, and time.       Morbidly obese white female in NAD  HENT:  Head: Normocephalic and atraumatic.  Neck: Normal range of motion. Neck supple.  Cardiovascular: Normal rate.        Irregular rhythym  Respiratory: Effort normal and breath sounds normal.  GI: Soft. Bowel sounds are normal. There is tenderness.  Tender in right upper quadrant to palpation.  Neurological: She is alert and oriented to person, place, and time.  Skin: Skin is warm and dry.     Assessment/Plan Impression: Biliary colic, cholelithiasis, history of chronic cholecystitis. See other multiple medical problems. Plan: Agree that the patient needs her gallbladder removed. Due to your history of atrial fibrillation, morbid obesity, COPD, and shortness of breath, will get cardiology consultation as she has been seen by [facility] cardiology this year for atrial fibrillation. The patient is being admitted for control of her nausea and pain.  Lorel Lembo A 08/31/2012, 10:04 AM

## 2012-08-31 NOTE — ED Provider Notes (Signed)
History     CSN: [id number]  Arrival date & time 08/31/12  0551   First MD Initiated Contact with Patient 08/31/12 [phone]      Chief Complaint  Patient presents with  . Abdominal Pain  . Back Pain    (Consider location/radiation/quality/duration/timing/severity/associated sxs/prior treatment) HPI Comments: Morbidly obese 71 year old female with a history of cholelithiasis who presents with right upper quadrant abdominal pain. She states that this has been in pain for several days, she was seen in the emergency department yesterday and according to the medical record which I have reviewed she had cholelithiasis on an ultrasound but no signs of cholecystitis. Her blood work was overall unremarkable. She had a HIDA scan that was performed in May showing a 6% gallbladder ejection fraction.  Since being discharged home she had a recurrence of her pain which has been present for approximately 18 hours, persistent, somewhat worse with eating and according to the patient in tolerable. There is no associated fevers or vomiting, no shortness of breath coughing wheezing headaches or diarrhea. She does admit to having urinary frequency.  Patient is a 71 y.o. female presenting with abdominal pain and back pain. The history is provided by the patient, medical records and the spouse.  Abdominal Pain The primary symptoms of the illness include abdominal pain.  Additional symptoms associated with the illness include back pain.  Back Pain  Associated symptoms include abdominal pain.    Past Medical History  Diagnosis Date  . GERD (gastroesophageal reflux disease)   . Fibromyalgia   . Anxiety   . Depression   . Obesity, morbid (more than 100 lbs over ideal weight or BMI > 40)   . Hyperlipemia   . Hypothyroidism   . Asthma   . Obstructive sleep apnea on CPAP and O2  . Migraine   . Colon polyp     TCS 2005 TICS [place], TCS/PROPOFOL 2011 SIMPLE ADENOMAS  . Allergic rhinitis   . Osteoarthritis   .  Palpitations   . Narcolepsy   . Cholecystitis   . Afib     Past Surgical History  Procedure Date  . Abdominal hysterectomy FIBROIDS 1982  . Rhinoplasty   . Bladder suspension 1982  . Kidney stent 1995  . Cardioversion 02/05/2012    Procedure: CARDIOVERSION;  Surgeon: Pamella Pert, MD;  Location: [facility] OR;  Service: Cardiovascular;  Laterality: N/A;    History reviewed. No pertinent family history.  History  Substance Use Topics  . Smoking status: Former Games developer  . Smokeless tobacco: Never Used   Comment: 22 years ago  . Alcohol Use: No    OB History    Grav Para Term Preterm Abortions TAB SAB Ect Mult Living                  Review of Systems  Gastrointestinal: Positive for abdominal pain.  Musculoskeletal: Positive for back pain.  All other systems reviewed and are negative.    Allergies  Atorvastatin; Meperidine hcl; and Zocor  Home Medications   Current Outpatient Rx  Name Route Sig Dispense Refill  . ALBUTEROL 90 MCG/ACT IN AERS Inhalation Inhale 2 puffs into the lungs every 6 (six) hours as needed. For shortness of breath    . DABIGATRAN ETEXILATE MESYLATE 150 MG PO CAPS Oral Take 150 mg by mouth every 12 (twelve) hours.    Marland Kitchen DIGOXIN 0.25 MG PO TABS Oral Take 250 mcg by mouth daily.    Marland Kitchen ESOMEPRAZOLE MAGNESIUM 40 MG PO CPDR  Oral Take 40 mg by mouth daily before breakfast.      . FLUOXETINE HCL 20 MG PO CAPS Oral Take 60 mg by mouth daily.      Marland Kitchen FLUTICASONE PROPIONATE 50 MCG/ACT NA SUSP Nasal Place 2 sprays into the nose daily as needed. For allergies    . LEVOTHYROXINE SODIUM 50 MCG PO TABS Oral Take 50 mcg by mouth daily.      Marland Kitchen LORAZEPAM 1 MG PO TABS Oral Take 1.5 mg by mouth every 8 (eight) hours.     Marland Kitchen MICONAZOLE NITRATE 2 % VA CREA Vaginal Place 1 applicator vaginally at bedtime as needed. Yeast infection    . ONDANSETRON 8 MG PO TBDP Oral Take 1 tablet (8 mg total) by mouth once. 20 tablet 0  . OXYCODONE-ACETAMINOPHEN 5-325 MG PO TABS Oral Take 1  tablet by mouth every 4 (four) hours as needed for pain. 30 tablet 0  . POTASSIUM CHLORIDE 10 MEQ PO TBCR Oral Take 10 mEq by mouth daily as needed. Only takes when takes Torsemide for swelling    . PROPRANOLOL HCL ER BEADS 80 MG PO CP24 Oral Take 80 mg by mouth every morning.     . TORSEMIDE 20 MG PO TABS Oral Take 20 mg by mouth daily as needed. For swelling      BP 111/75  Pulse 85  Temp 97.3 F (36.3 C) (Oral)  Resp 22  Ht 5\' 2"  (1.575 m)  Wt 280 lb (127.007 kg)  BMI 51.21 kg/m2  SpO2 96%  Physical Exam  Nursing note and vitals reviewed. Constitutional: She appears well-developed and well-nourished. No distress.  HENT:  Head: Normocephalic and atraumatic.  Mouth/Throat: Oropharynx is clear and moist. No oropharyngeal exudate.  Eyes: Conjunctivae normal and EOM are normal. Pupils are equal, round, and reactive to light. Right eye exhibits no discharge. Left eye exhibits no discharge. No scleral icterus.  Neck: Normal range of motion. Neck supple. No JVD present. No thyromegaly present.  Cardiovascular: Normal rate, regular rhythm, normal heart sounds and intact distal pulses.  Exam reveals no gallop and no friction rub.   No murmur heard. Pulmonary/Chest: Effort normal and breath sounds normal. No respiratory distress. She has no wheezes. She has no rales.  Abdominal: Soft. Bowel sounds are normal. She exhibits no distension and no mass. There is tenderness.       Morbidly obese abdomen, minimal tenderness in the suprapubic area, tenderness with guarding in the right upper quadrant. No peritoneal signs  Musculoskeletal: Normal range of motion. She exhibits no edema and no tenderness.  Lymphadenopathy:    She has no cervical adenopathy.  Neurological: She is alert. Coordination normal.  Skin: Skin is warm and dry. No rash noted. No erythema.  Psychiatric: She has a normal mood and affect. Her behavior is normal.    ED Course  Procedures (including critical care time)  Labs  Reviewed  URINALYSIS, ROUTINE W REFLEX MICROSCOPIC - Abnormal; Notable for the following:    Specific Gravity, Urine <1.005 (*)     All other components within normal limits  CBC WITH DIFFERENTIAL  COMPREHENSIVE METABOLIC PANEL  LIPASE, BLOOD  DIGOXIN LEVEL  APTT  PROTIME-INR   US Abdomen Complete  08/29/2012  *RADIOLOGY REPORT*  Clinical Data:  Abdominal pain.  COMPLETE ABDOMINAL ULTRASOUND  Comparison:  MRI 02/14/2011  Findings:  Gallbladder:  There are echogenic foci at the base of the gallbladder with posterior acoustic shadowing.  Findings consistent with multiple stones.  No evidence for  gallbladder wall thickening. The patient does not have a sonographic Murphy's sign.  Common bile duct:  Measures 0.7 cm.  Liver:  The liver is heterogeneous and hyperechoic.  Liver measures up to an 19.7 cm in length.  Main portal vein is patent. There appears to be a focal area of fatty sparing adjacent to the gallbladder.  IVC:  Appears normal.  Pancreas:  No focal abnormality seen. Limited evaluation of pancreatic tail.  Spleen:  Spleen measures 11.5 cm in length.  Right Kidney:  Right kidney measures 12.6 cm in length without hydronephrosis.  Normal renal echotexture.  Left Kidney:  Left kidney measures 13.4 cm in length without hydronephrosis.  Abdominal aorta:  No aneurysm identified.  IMPRESSION: Cholelithiasis.  Negative for sonographic Murphy's sign.  Common bile duct is slightly prominent.  The liver is diffusely echogenic and heterogeneous.  Findings are compatible with hepatic steatosis.   Original Report Authenticated By: Richarda Overlie, M.D.      1. Biliary colic       MDM  Patient has ongoing right upper quadrant pain which would be consistent with a symptomatic cholelithiasis or progressive cholecystitis. I have discussed these findings with the general surgeon who is on call this morning Dr. Lovell Sheehan who was already aware of the patient. We'll obtain digoxin level, preop labs. I will control  her pain with hydromorphone, IV fluids, general surgeon has requested truncated Gen. Admission orders be written.  The patient appears stable at this time.   ED ECG REPORT  I personally interpreted this EKG   Date: 08/31/2012   Rate: 58  Rhythm: atrial fibrillation  QRS Axis: normal  Intervals: normal  ST/T Wave abnormalities: nonspecific T wave changes  Conduction Disutrbances:none  Narrative Interpretation:   Old EKG Reviewed: Compared with 02/05/2012, atrial fibrillation (patient has history of) has replaced normal sinus rhythm      Vida Roller, MD 08/31/12 [phone]

## 2012-08-31 NOTE — ED Notes (Signed)
Attempted to call report. Cindy, RN to call back. 

## 2012-09-01 ENCOUNTER — Inpatient Hospital Stay (HOSPITAL_COMMUNITY): Payer: Medicare Other

## 2012-09-01 ENCOUNTER — Encounter (HOSPITAL_COMMUNITY): Payer: Self-pay | Admitting: Physician Assistant

## 2012-09-01 DIAGNOSIS — I509 Heart failure, unspecified: Secondary | ICD-10-CM

## 2012-09-01 DIAGNOSIS — J449 Chronic obstructive pulmonary disease, unspecified: Secondary | ICD-10-CM | POA: Diagnosis not present

## 2012-09-01 DIAGNOSIS — K8020 Calculus of gallbladder without cholecystitis without obstruction: Secondary | ICD-10-CM

## 2012-09-01 DIAGNOSIS — I5030 Unspecified diastolic (congestive) heart failure: Secondary | ICD-10-CM

## 2012-09-01 DIAGNOSIS — K8010 Calculus of gallbladder with chronic cholecystitis without obstruction: Secondary | ICD-10-CM | POA: Diagnosis not present

## 2012-09-01 DIAGNOSIS — I4891 Unspecified atrial fibrillation: Secondary | ICD-10-CM

## 2012-09-01 MED ORDER — SODIUM CHLORIDE 0.9 % IJ SOLN
INTRAMUSCULAR | Status: AC
  Administered 2012-09-01: 3 mL
  Filled 2012-09-01: qty 3

## 2012-09-01 MED ORDER — SODIUM CHLORIDE 0.9 % IJ SOLN
INTRAMUSCULAR | Status: AC
  Administered 2012-09-01: 11:00:00
  Filled 2012-09-01: qty 3

## 2012-09-01 NOTE — Consult Note (Signed)
CARDIOLOGY CONSULT NOTE  Barnes ID: [name], MRN: [id number], DOB/AGE: 04-11-1941 71 y.o. Admit date: 08/31/2012   Date of Consult: 09/01/2012 Primary Physician: Evlyn Courier, MD Primary Cardiologist: Dr. Yates Decamp  Chief Complaint: abdominal pain Reason for Consult: pre-op consultation for cholecystectomy  HPI: [name] is a 71 y/o F with hx of morbid obesity, hyperlipidemia, OSA, hypothyroidism, low risk nuc 12/2011, and atrial fibrillation s/p DCCV 01/2012 that did not hold. [name] presented to [facility] with complaints of RUQ abdominal pain and nausea. [name] had recently undergone abdominal US showing multiple gallstones. [name] was seen by another surgeon who stated that he could not do [name] surgery do to [name] obesity. Dr. Lovell Sheehan has admitted [name] and feels that [name] does require cholecystectomy. We are asked to see [name] for preop consultation.  Per Dr. Verl Dicker note from 01/2012, [name] DCCV did not hold and Jennifer decision was made to pursue rate control in light of being mostly asymptomatic from [name] atrial fibrillation. [name] denies a history of DM, HTN, stroke. [name] denies hx of CHF but has had chronic "volume overload" for years treated with PRN torsemide. [name] says on occasion [name] abdomen will swell up. [name] was taking daily torsemide but it led to gout so [name] only takes it PRN. [name] states that sometimes [name] will lose up to 30 lbs of water weight in a day or two. [name] has chronic unchanged SOB which [name] attributes to both Jennifer volume overload and [name] weight. [name] had more bloating/SOB than usual yesterday but [name] states this seems to have improved today. No orthopnea, PND, syncope, or chest pain. [name] called Dr. Verl Dicker office last week regarding possible upcoming gallbladder surgery and was placed on Lovenox while Pradaxa was held. This has not been continued in Jennifer hospital.  EKG does demonstrate that [name] is back in atrial fibrillation, rate 58, nonspecific ST-T changes. [name] denies any awareness of this. Labs  pertinent for ?glucose 135. CXR suggests COPD without acute abnormality. Vitals are stable with general BP in Jennifer mid 90s-110 systolic, O2 sat 11-91 on RA.  Past Medical History  Diagnosis Date  . GERD (gastroesophageal reflux disease)   . Fibromyalgia   . Anxiety   . Depression   . Obesity, morbid (more than 100 lbs over ideal weight or BMI > 40)   . Hyperlipemia   . Hypothyroidism   . Asthma   . Obstructive sleep apnea on CPAP and O2  . Migraine   . Colon polyp     TCS 2005 TICS [place], TCS/PROPOFOL 2011 SIMPLE ADENOMAS  . Allergic rhinitis   . Osteoarthritis   . Palpitations   . Narcolepsy   . Cholecystitis   . Afib     a. s/p DCCV 01/2012 but did not hold.  . Diverticulosis 2005  . Internal hemorrhoids     H/o Rectal bleeding secondary to internal hemorrhoids 04/2010.  . Volume overload     uses torsemide PRN  . Diastolic CHF 09/01/2012  . Fatty liver       Most Recent Cardiac Studies: Per Dr. Verl Dicker office note: Echo 01/09/12 - normal LVEF, severe biatrial enlargement, mild pulmonary hypertension Lexiscan stress 01/11/12: gut uptake artifact. No definite ischemia. EF 53%. Low risk.   Surgical History:  Past Surgical History  Procedure Date  . Abdominal hysterectomy FIBROIDS 1982  . Rhinoplasty   . Bladder suspension 1982  . Kidney stent 1995  . Cardioversion 02/05/2012    Procedure: CARDIOVERSION;  Surgeon: Harland Dingwall  Jacinto Halim, MD;  Location: [facility] OR;  Service: Cardiovascular;  Laterality: N/A;     Home Meds: Prior to Admission medications   Medication Sig Start Date End Date Taking? Authorizing Provider  albuterol (PROVENTIL,VENTOLIN) 90 MCG/ACT inhaler Inhale 2 puffs into Jennifer lungs every 6 (six) hours as needed. For shortness of breath   Yes Historical Provider, MD  dabigatran (PRADAXA) 150 MG CAPS Take 150 mg by mouth every 12 (twelve) hours.   Yes Historical Provider, MD  digoxin (LANOXIN) 0.25 MG tablet Take 250 mcg by mouth daily.   Yes Historical Provider, MD    enoxaparin (LOVENOX) 120 MG/0.8ML injection Inject 120 mg into Jennifer skin 2 (two) times daily.   Yes Historical Provider, MD  esomeprazole (NEXIUM) 40 MG capsule Take 40 mg by mouth daily before breakfast.     Yes Historical Provider, MD  FLUoxetine (PROZAC) 20 MG capsule Take 60 mg by mouth daily.     Yes Historical Provider, MD  fluticasone (FLONASE) 50 MCG/ACT nasal spray Place 2 sprays into Jennifer nose daily as needed. For allergies   Yes Historical Provider, MD  levothyroxine (SYNTHROID, LEVOTHROID) 50 MCG tablet Take 50 mcg by mouth daily.     Yes Historical Provider, MD  LORazepam (ATIVAN) 1 MG tablet Take 1.5 mg by mouth 3 (three) times daily.   Yes Historical Provider, MD  miconazole (MONISTAT 7) 2 % vaginal cream Place 1 applicator vaginally at bedtime as needed. Yeast infection   Yes Historical Provider, MD  montelukast (SINGULAIR) 10 MG tablet Take 10 mg by mouth at bedtime.   Yes Historical Provider, MD  ondansetron (ZOFRAN-ODT) 8 MG disintegrating tablet Take 1 tablet (8 mg total) by mouth once. 08/29/12  Yes Flint Melter, MD  oxyCODONE-acetaminophen (PERCOCET/ROXICET) 5-325 MG per tablet Take 1 tablet by mouth every 4 (four) hours as needed for pain. 08/29/12  Yes Flint Melter, MD  potassium chloride SA (K-DUR,KLOR-CON) 20 MEQ tablet Take 40 mEq by mouth daily as needed. Take with torsemide   Yes Historical Provider, MD  propranolol (INNOPRAN XL) 80 MG 24 hr capsule Take 80 mg by mouth every morning.    Yes Historical Provider, MD  torsemide (DEMADEX) 20 MG tablet Take 40 mg by mouth daily as needed. swelling   Yes Historical Provider, MD    Inpatient Medications:     . digoxin  250 mcg Oral Daily  . enoxaparin  40 mg Subcutaneous Q24H  . FLUoxetine  60 mg Oral Daily  . influenza  inactive virus vaccine  0.5 mL Intramuscular Tomorrow-1000  . levothyroxine  50 mcg Oral Daily  . LORazepam  1.5 mg Oral Q8H  . propranolol ER  80 mg Oral Daily  . sodium chloride         Allergies:  Allergies  Allergen Reactions  . Atorvastatin Other (See Comments)    Muscle sorness  . Meperidine Hcl Other (See Comments)    Drops BP  . Zocor (Simvastatin - High Dose) Other (See Comments)    Elevated liver enzymes    History   Social History  . Marital Status: Married    Spouse Name: N/A    Number of Children: N/A  . Years of Education: N/A   Occupational History  . Not on file.   Social History Main Topics  . Smoking status: Former Games developer  . Smokeless tobacco: Never Used   Comment: 22 years ago  . Alcohol Use: No  . Drug Use: No  . Sexually Active: No  Other Topics Concern  . Not on file   Social History Narrative  . No narrative on file     Family History  Problem Relation Age of Onset  . Colon cancer Mother   . Diabetes Mother   . Cerebral aneurysm Father      Review of Systems: General: negative for chills, fever, night sweats. Weight fluctuates Cardiovascular: see above. [name] has occasional LEE that comes and goes, today it is not as bad Dermatological: negative for rash Respiratory: negative for cough or wheezing Urologic: negative for hematuria Abdominal: negative for diarrhea, bright red blood per rectum, melena, or hematemesis. See above Neurologic: negative for visual changes, syncope, or dizziness All other systems reviewed and are otherwise negative except as noted above.  Labs:  Lab Results  Component Value Date   WBC 8.5 08/31/2012   HGB 12.3 08/31/2012   HCT 39.1 08/31/2012   MCV 88.9 08/31/2012   PLT 254 08/31/2012     Lab 08/31/12 0614  NA 136  K 3.8  CL 101  CO2 27  BUN 6  CREATININE 0.68  CALCIUM 8.9  PROT 7.0  BILITOT 0.5  ALKPHOS 94  ALT 29  AST 26  GLUCOSE 135*   Lab Results  Component Value Date   CHOL 170 07/22/2009   HDL 48 07/22/2009   LDLCALC 96 07/22/2009   TRIG 129 07/22/2009    Radiology/Studies:  Dg Chest 2 View  09/01/2012  *RADIOLOGY REPORT*  Clinical Data: Hypertension  CHEST - 2  VIEW  Comparison: 12/12/2010  Findings: Jennifer heart and pulmonary vascularity are within normal limits.  Jennifer lungs are well aerated without evidence of focal infiltrate.  Mild hyperinflation is seen.  Jennifer osseous structures are grossly unremarkable.  IMPRESSION: COPD without acute abnormality.  No significant change from prior exam.   Original Report Authenticated By: Phillips Odor, M.D.    US Abdomen Complete  08/29/2012  *RADIOLOGY REPORT*  Clinical Data:  Abdominal pain.  COMPLETE ABDOMINAL ULTRASOUND  Comparison:  MRI 02/14/2011  Findings:  Gallbladder:  There are echogenic foci at Jennifer base of Jennifer gallbladder with posterior acoustic shadowing.  Findings consistent with multiple stones.  No evidence for gallbladder wall thickening. [name] does not have a sonographic Murphy's sign.  Common bile duct:  Measures 0.7 cm.  Liver:  Jennifer liver is heterogeneous and hyperechoic.  Liver measures up to an 19.7 cm in length.  Main portal vein is patent. There appears to be a focal area of fatty sparing adjacent to Jennifer gallbladder.  IVC:  Appears normal.  Pancreas:  No focal abnormality seen. Limited evaluation of pancreatic tail.  Spleen:  Spleen measures 11.5 cm in length.  Right Kidney:  Right kidney measures 12.6 cm in length without hydronephrosis.  Normal renal echotexture.  Left Kidney:  Left kidney measures 13.4 cm in length without hydronephrosis.  Abdominal aorta:  No aneurysm identified.  IMPRESSION: Cholelithiasis.  Negative for sonographic Murphy's sign.  Common bile duct is slightly prominent.  Jennifer liver is diffusely echogenic and heterogeneous.  Findings are compatible with hepatic steatosis.   Original Report Authenticated By: Richarda Overlie, M.D.    EKG: atrial fib with slow ventricular response 58 bpm, nonspecific ST-T changes (NSR in March 2013)  Physical Exam: Blood pressure 109/71, pulse 67, temperature 98.2 F (36.8 C), temperature source Oral, resp. rate 18, height 5\' 2"  (1.575 m), weight 292 lb  8.8 oz (132.7 kg), SpO2 91.00%. General: Well developed obese WF in no acute distress.  Head: Normocephalic, atraumatic, sclera non-icteric, no xanthomas, nares are without discharge.  Neck: Negative for carotid bruits. JVD difficult to assess given neck habitus. Lungs: Clear bilaterally to auscultation without wheezes, rales, or rhonchi. Breathing is unlabored. Heart: Irregular rhythm, controlled rate with S1 S2. No murmurs, rubs, or gallops appreciated. Abdomen: Soft, non-tender, non-distended with normoactive bowel sounds. No hepatomegaly. No rebound/guarding. No obvious abdominal masses. Msk:  Strength and tone appear normal for age. Extremities: No clubbing or cyanosis. Numerous varicose veins noted from calves down to ankles. No significant edema noted.  Distal pedal pulses are 2+ and equal bilaterally. Neuro: Alert and oriented X 3. No facial asymmetry. No focal deficit. Moves all extremities spontaneously. Psych:  Responds to questions appropriately with somewhat flat affect.   Assessment and Plan:   1. Cholelithasis and chronic cholecystitis, being considered for cholecystectomy 2. Atrial fibrillation, s/p failed cardioversion 01/2012, rate controlled 3. Chronic volume overload likely related to chronic diastolic CHF 4. Morbid obesity BMI 53.6 5. OSA on CPAP with ?possible component of obesity hypoventilation syndrome 6. Hyperglycemia  [name] had a low risk nuclear stress test in February of this year and has had no significant interim change in [name] cardiac status. [name] remains in atrial fibrillation with controlled ventricular response. Would continue [name] rate control including current digoxin and propranolol. If Barnes has to be NPO, can utilize IV form of digoxin and lopressor. Will add 2g sodium modifier to diet. Follow daily weights and I&Os as volume management will be important to observe. May need to institute PRN diuretics as [name] manages at home. Given that [name] CHADS2 score  at present time is only 1 for possible chronic diastolic CHF, we do not feel that [name] requires bridging with full-dose Lovenox. Check A1C with next lab draw given hyperglycemia. Pradaxa remains on hold. Would place Barnes on telemetry in Jennifer perioperative period.  Consider pulmonary pre-op evaluation given [name] morbid obesity, OSA, and low-normal O2 sats.  Signed, Ronie Spies PA-C 09/01/2012, 12:46 PM   Barnes seen and examined.  Agree with findings of D Dunn.  Barnes is a 71 year old who is followed by Jeanella Cara.  History of HTN and atrial fibrillation.  Has had recent ischemic evaluation which was negative. (March 2013)  Echo with normal LV systolic function.  [name] failed cardioversion and is maintained on anticoagulation and rate control  ON exam:  Barnes is in NAD  Lungs:  CTA  Cardiac:  IRreg, irreg.  No signif murmurs  Abd is obese.  Deferred further exam  Ext:  Tr edema.  Varicosities.  Impression: From a cardiac standpoint I think [name] is a rel low risk for a major cardiac event with surgery.  [name] does not need bridging anticoagulation.    Watch I/Os closely.  Continue telemetry.  Consider pulmonary evaluation pre op with morbid obesity and OSA>

## 2012-09-01 NOTE — Progress Notes (Signed)
09/01/12 1628 Patient had order to receive influenza vaccine today per protocol, possible upcoming surgery per report. Pt to receive influenza vaccine after surgery, rather than pre-op per pharmacy (spoke with [facility]). Discussed with patient, stated okay and would like to receive prior to discharge if possible. Earnstine Regal, RN

## 2012-09-02 DIAGNOSIS — K8010 Calculus of gallbladder with chronic cholecystitis without obstruction: Secondary | ICD-10-CM | POA: Diagnosis not present

## 2012-09-02 LAB — HEMOGLOBIN A1C: Hgb A1c MFr Bld: 6.3 % — ABNORMAL HIGH (ref <5.7)

## 2012-09-02 MED ORDER — INFLUENZA VIRUS VACC SPLIT PF IM SUSP
0.5000 mL | INTRAMUSCULAR | Status: AC | PRN
  Administered 2012-09-04: 0.5 mL via INTRAMUSCULAR

## 2012-09-02 NOTE — Progress Notes (Signed)
  Subjective: Still having intermittent episodes of her upper quadrant abdominal pain.  Objective: Vital signs in last 24 hours: Temp:  [98 F (36.7 C)-98.7 F (37.1 C)] 98.4 F (36.9 C) (10/08 0529) Pulse Rate:  [64-81] 64  (10/08 0930) Resp:  [18-20] 20  (10/08 0529) BP: (103-119)/(56-71) 103/56 mmHg (10/08 0529) SpO2:  [91 %-96 %] 93 % (10/08 0930) Weight:  [134.3 kg (296 lb 1.2 oz)-135 kg (297 lb 9.9 oz)] 135 kg (297 lb 9.9 oz) (10/08 0500) Last BM Date: 08/29/12  Intake/Output from previous day: 10/07 0701 - 10/08 0700 In: 1323 [P.O.:720; I.V.:603] Out: -  Intake/Output this shift:    General appearance: alert, cooperative and no distress Resp: clear to auscultation bilaterally Cardio: irregularly irregular rhythm GI: Soft. Tenderness to the right upper quadrant to deep palpation. No rigidity noted.  Lab Results:   [facility] 08/31/12 0614  WBC 8.5  HGB 12.3  HCT 39.1  PLT 254   BMET  Basename 08/31/12 0614  NA 136  K 3.8  CL 101  CO2 27  GLUCOSE 135*  BUN 6  CREATININE 0.68  CALCIUM 8.9   PT/INR  Basename 08/31/12 0614  LABPROT 14.8  INR 1.18    Studies/Results: Dg Chest 2 View  09/01/2012  *RADIOLOGY REPORT*  Clinical Data: Hypertension  CHEST - 2 VIEW  Comparison: 12/12/2010  Findings: The heart and pulmonary vascularity are within normal limits.  The lungs are well aerated without evidence of focal infiltrate.  Mild hyperinflation is seen.  The osseous structures are grossly unremarkable.  IMPRESSION: COPD without acute abnormality.  No significant change from prior exam.   Original Report Authenticated By: Phillips Odor, M.D.     Anti-infectives: Anti-infectives    None      Assessment/Plan: s/p Procedure(s): LAPAROSCOPIC CHOLECYSTECTOMY Impression: Cholecystitis, cholelithiasis. We'll plan on laparoscopic cholecystectomy tomorrow. Appreciate cardiology input. We'll get preoperative ABG. Pulmonology will be available if needed. The risks  and benefits of the procedure including bleeding, infection, cardiopulmonary difficulties, the possibly of an open procedure were fully explained to the patient, gave informed consent. Husband was present. All questions were answered.  LOS: 2 days    Ameya Kutz A 09/02/2012

## 2012-09-02 NOTE — Care Management Note (Signed)
    Page 1 of 1   09/04/2012     10:40:52 AM   CARE MANAGEMENT NOTE 09/04/2012  Patient:  [name], [name]   Account Number:  [id number]  Date Initiated:  09/02/2012  Documentation initiated by:  Sharrie Rothman  Subjective/Objective Assessment:   Pt admitted from home with abd pain. Pt scheduled for sugery on 09/03/12. Pt lives with her husband and will return at discharge.Pt has a cane, walker, and scooter for home use.     Action/Plan:   No CM or HH needs at this time. Will continue to follow.   Anticipated DC Date:  09/05/2012   Anticipated DC Plan:  HOME/SELF CARE      DC Planning Services  CM consult      Choice offered to / List presented to:             Status of service:  Completed, signed off Medicare Important Message given?  YES (If response is "NO", the following Medicare IM given date fields will be blank) Date Medicare IM given:  09/04/2012 Date Additional Medicare IM given:    Discharge Disposition:    Per UR Regulation:    If discussed at Long Length of Stay Meetings, dates discussed:    Comments:  09/04/12 1038 Arlyss Queen, RN BSN CM Pt discharged home today. Pt discharged with lovenox BID. Pt stated that she has been on that in the past and her husband is comfortable with administering the injections to her. Pt refused any HH for the lovenox. No other HH or DME needs noted.  09/02/12 1307 Arlyss Queen, RN BSN CM

## 2012-09-03 ENCOUNTER — Encounter (HOSPITAL_COMMUNITY): Payer: Self-pay | Admitting: *Deleted

## 2012-09-03 ENCOUNTER — Encounter (HOSPITAL_COMMUNITY)
Admission: EM | Disposition: A | Discharge status: Home / Self Care | Payer: Self-pay | Source: Home / Self Care | Attending: General Surgery

## 2012-09-03 ENCOUNTER — Encounter (HOSPITAL_COMMUNITY): Payer: Self-pay | Admitting: Anesthesiology

## 2012-09-03 ENCOUNTER — Inpatient Hospital Stay (HOSPITAL_COMMUNITY): Payer: Medicare Other | Admitting: Anesthesiology

## 2012-09-03 DIAGNOSIS — K8000 Calculus of gallbladder with acute cholecystitis without obstruction: Secondary | ICD-10-CM | POA: Diagnosis not present

## 2012-09-03 DIAGNOSIS — K8010 Calculus of gallbladder with chronic cholecystitis without obstruction: Secondary | ICD-10-CM | POA: Diagnosis not present

## 2012-09-03 DIAGNOSIS — K8020 Calculus of gallbladder without cholecystitis without obstruction: Secondary | ICD-10-CM | POA: Diagnosis not present

## 2012-09-03 DIAGNOSIS — K819 Cholecystitis, unspecified: Secondary | ICD-10-CM | POA: Diagnosis not present

## 2012-09-03 HISTORY — PX: CHOLECYSTECTOMY: SHX55

## 2012-09-03 LAB — BLOOD GAS, ARTERIAL
Acid-Base Excess: 3.6 mmol/L — ABNORMAL HIGH (ref 0.0–2.0)
Bicarbonate: 28.2 mEq/L — ABNORMAL HIGH (ref 20.0–24.0)
Patient temperature: 37
TCO2: 25.8 mmol/L (ref 0–100)
pCO2 arterial: 47.1 mmHg — ABNORMAL HIGH (ref 35.0–45.0)

## 2012-09-03 LAB — SURGICAL PCR SCREEN: MRSA, PCR: NEGATIVE

## 2012-09-03 SURGERY — LAPAROSCOPIC CHOLECYSTECTOMY
Anesthesia: General | Site: Abdomen | Wound class: Contaminated

## 2012-09-03 MED ORDER — FENTANYL CITRATE 0.05 MG/ML IJ SOLN
25.0000 ug | INTRAMUSCULAR | Status: DC | PRN
  Administered 2012-09-03 (×2): 50 ug via INTRAVENOUS

## 2012-09-03 MED ORDER — LIDOCAINE HCL (PF) 1 % IJ SOLN
INTRAMUSCULAR | Status: AC
  Filled 2012-09-03: qty 5

## 2012-09-03 MED ORDER — MIDAZOLAM HCL 2 MG/2ML IJ SOLN
INTRAMUSCULAR | Status: AC
  Filled 2012-09-03: qty 2

## 2012-09-03 MED ORDER — ROCURONIUM BROMIDE 50 MG/5ML IV SOLN
INTRAVENOUS | Status: AC
  Filled 2012-09-03: qty 1

## 2012-09-03 MED ORDER — CEFAZOLIN SODIUM-DEXTROSE 2-3 GM-% IV SOLR
INTRAVENOUS | Status: AC
  Filled 2012-09-03: qty 50

## 2012-09-03 MED ORDER — ONDANSETRON HCL 4 MG/2ML IJ SOLN
4.0000 mg | Freq: Four times a day (QID) | INTRAMUSCULAR | Status: DC | PRN
  Administered 2012-09-03: 4 mg via INTRAVENOUS
  Filled 2012-09-03: qty 2

## 2012-09-03 MED ORDER — SODIUM CHLORIDE 0.9 % IR SOLN
Status: DC | PRN
  Administered 2012-09-03: 1000 mL

## 2012-09-03 MED ORDER — GLYCOPYRROLATE 0.2 MG/ML IJ SOLN
0.2000 mg | Freq: Once | INTRAMUSCULAR | Status: AC
  Administered 2012-09-03: 0.2 mg via INTRAVENOUS

## 2012-09-03 MED ORDER — ALBUTEROL SULFATE (5 MG/ML) 0.5% IN NEBU
2.5000 mg | INHALATION_SOLUTION | Freq: Once | RESPIRATORY_TRACT | Status: AC
  Administered 2012-09-03: 2.5 mg via RESPIRATORY_TRACT

## 2012-09-03 MED ORDER — DEXTROSE 5 % IV SOLN
3.0000 g | INTRAVENOUS | Status: AC
  Administered 2012-09-03: 3 g via INTRAVENOUS
  Filled 2012-09-03: qty 3000

## 2012-09-03 MED ORDER — FENTANYL CITRATE 0.05 MG/ML IJ SOLN
INTRAMUSCULAR | Status: AC
  Filled 2012-09-03: qty 2

## 2012-09-03 MED ORDER — FENTANYL CITRATE 0.05 MG/ML IJ SOLN
INTRAMUSCULAR | Status: DC | PRN
  Administered 2012-09-03 (×4): 50 ug via INTRAVENOUS

## 2012-09-03 MED ORDER — LIDOCAINE HCL 1 % IJ SOLN
INTRAMUSCULAR | Status: DC | PRN
  Administered 2012-09-03: 40 mg via INTRADERMAL

## 2012-09-03 MED ORDER — ONDANSETRON HCL 4 MG/2ML IJ SOLN
4.0000 mg | Freq: Once | INTRAMUSCULAR | Status: AC
  Administered 2012-09-03: 4 mg via INTRAVENOUS

## 2012-09-03 MED ORDER — SUCCINYLCHOLINE CHLORIDE 20 MG/ML IJ SOLN
INTRAMUSCULAR | Status: AC
  Filled 2012-09-03: qty 1

## 2012-09-03 MED ORDER — ONDANSETRON HCL 4 MG PO TABS: 4.0000 mg | ORAL_TABLET | Freq: Four times a day (QID) | ORAL | Status: DC | PRN

## 2012-09-03 MED ORDER — BUPIVACAINE HCL 0.5 % IJ SOLN: INTRAMUSCULAR | Status: DC | PRN

## 2012-09-03 MED ORDER — LACTATED RINGERS IV SOLN
INTRAVENOUS | Status: DC | PRN
  Administered 2012-09-03: 11:00:00 via INTRAVENOUS

## 2012-09-03 MED ORDER — ACETAMINOPHEN 10 MG/ML IV SOLN
1000.0000 mg | Freq: Four times a day (QID) | INTRAVENOUS | Status: AC
  Administered 2012-09-03 – 2012-09-04 (×4): 1000 mg via INTRAVENOUS
  Filled 2012-09-03 (×4): qty 100

## 2012-09-03 MED ORDER — SODIUM CHLORIDE 0.9 % IR SOLN
Status: DC | PRN
  Administered 2012-09-03: 3000 mL

## 2012-09-03 MED ORDER — GLYCOPYRROLATE 0.2 MG/ML IJ SOLN
INTRAMUSCULAR | Status: AC
  Filled 2012-09-03: qty 3

## 2012-09-03 MED ORDER — LACTATED RINGERS IV SOLN: INTRAVENOUS | Status: DC

## 2012-09-03 MED ORDER — MIDAZOLAM HCL 2 MG/2ML IJ SOLN
1.0000 mg | INTRAMUSCULAR | Status: DC | PRN
  Administered 2012-09-03: 2 mg via INTRAVENOUS

## 2012-09-03 MED ORDER — BUPIVACAINE HCL (PF) 0.5 % IJ SOLN
INTRAMUSCULAR | Status: AC
  Filled 2012-09-03: qty 30

## 2012-09-03 MED ORDER — ENOXAPARIN SODIUM 40 MG/0.4ML ~~LOC~~ SOLN
40.0000 mg | SUBCUTANEOUS | Status: DC
  Administered 2012-09-04: 40 mg via SUBCUTANEOUS
  Filled 2012-09-03: qty 0.4

## 2012-09-03 MED ORDER — FENTANYL CITRATE 0.05 MG/ML IJ SOLN
INTRAMUSCULAR | Status: AC
  Filled 2012-09-03: qty 5

## 2012-09-03 MED ORDER — ONDANSETRON HCL 4 MG/2ML IJ SOLN: 4.0000 mg | Freq: Once | INTRAMUSCULAR | Status: DC | PRN

## 2012-09-03 MED ORDER — SUCCINYLCHOLINE CHLORIDE 20 MG/ML IJ SOLN
INTRAMUSCULAR | Status: DC | PRN
  Administered 2012-09-03: 120 mg via INTRAVENOUS

## 2012-09-03 MED ORDER — NEOSTIGMINE METHYLSULFATE 1 MG/ML IJ SOLN
INTRAMUSCULAR | Status: AC
  Filled 2012-09-03: qty 10

## 2012-09-03 MED ORDER — DEXAMETHASONE SODIUM PHOSPHATE 4 MG/ML IJ SOLN
4.0000 mg | Freq: Once | INTRAMUSCULAR | Status: AC
  Administered 2012-09-03: 4 mg via INTRAVENOUS

## 2012-09-03 MED ORDER — PROPOFOL 10 MG/ML IV BOLUS
INTRAVENOUS | Status: DC | PRN
  Administered 2012-09-03: 130 mg via INTRAVENOUS

## 2012-09-03 MED ORDER — CHLORHEXIDINE GLUCONATE 4 % EX LIQD
1.0000 "application " | Freq: Once | CUTANEOUS | Status: AC
  Administered 2012-09-03: 1 via TOPICAL
  Filled 2012-09-03: qty 15

## 2012-09-03 MED ORDER — SODIUM CHLORIDE 0.9 % IN NEBU
INHALATION_SOLUTION | RESPIRATORY_TRACT | Status: AC
  Filled 2012-09-03: qty 3

## 2012-09-03 MED ORDER — PROPOFOL 10 MG/ML IV EMUL
INTRAVENOUS | Status: AC
  Filled 2012-09-03: qty 20

## 2012-09-03 MED ORDER — HEMOSTATIC AGENTS (NO CHARGE) OPTIME
TOPICAL | Status: DC | PRN
  Administered 2012-09-03: 1 via TOPICAL

## 2012-09-03 MED ORDER — ALBUTEROL SULFATE HFA 108 (90 BASE) MCG/ACT IN AERS: 2.0000 | INHALATION_SPRAY | Freq: Four times a day (QID) | RESPIRATORY_TRACT | Status: DC | PRN

## 2012-09-03 MED ORDER — ALBUTEROL SULFATE (5 MG/ML) 0.5% IN NEBU
INHALATION_SOLUTION | RESPIRATORY_TRACT | Status: AC
  Filled 2012-09-03: qty 0.5

## 2012-09-03 MED ORDER — ROCURONIUM BROMIDE 100 MG/10ML IV SOLN
INTRAVENOUS | Status: DC | PRN
  Administered 2012-09-03: 5 mg via INTRAVENOUS
  Administered 2012-09-03: 30 mg via INTRAVENOUS

## 2012-09-03 MED ORDER — BUPIVACAINE HCL (PF) 0.5 % IJ SOLN
INTRAMUSCULAR | Status: DC | PRN
  Administered 2012-09-03: 10 mL

## 2012-09-03 MED ORDER — CEFAZOLIN SODIUM 1-5 GM-% IV SOLN
INTRAVENOUS | Status: AC
  Filled 2012-09-03: qty 50

## 2012-09-03 MED ORDER — DEXAMETHASONE SODIUM PHOSPHATE 4 MG/ML IJ SOLN
INTRAMUSCULAR | Status: AC
  Filled 2012-09-03: qty 1

## 2012-09-03 MED ORDER — GLYCOPYRROLATE 0.2 MG/ML IJ SOLN
INTRAMUSCULAR | Status: AC
  Filled 2012-09-03: qty 1

## 2012-09-03 MED ORDER — LACTATED RINGERS IV SOLN
INTRAVENOUS | Status: DC
  Administered 2012-09-03 – 2012-09-04 (×2): via INTRAVENOUS

## 2012-09-03 SURGICAL SUPPLY — 44 items
AHTO TUBE SET SUCTION/IRRIGATOR ×1 IMPLANT
APPLIER CLIP ROT 10 11.4 M/L (STAPLE) ×2
APR CLP MED LRG 11.4X10 (STAPLE) ×1
BAG HAMPER (MISCELLANEOUS) ×2 IMPLANT
BAG SPEC RTRVL LRG 6X4 10 (ENDOMECHANICALS) ×1
CLIP APPLIE ROT 10 11.4 M/L (STAPLE) ×1 IMPLANT
CLOTH BEACON ORANGE TIMEOUT ST (SAFETY) ×2 IMPLANT
COVER LIGHT HANDLE STERIS (MISCELLANEOUS) ×4 IMPLANT
CUTTER LINEAR ENDO 35 ETS TH (STAPLE) ×1 IMPLANT
DECANTER SPIKE VIAL GLASS SM (MISCELLANEOUS) ×2 IMPLANT
DURAPREP 26ML APPLICATOR (WOUND CARE) ×2 IMPLANT
ELECT REM PT RETURN 9FT ADLT (ELECTROSURGICAL) ×2
ELECTRODE REM PT RTRN 9FT ADLT (ELECTROSURGICAL) ×1 IMPLANT
FILTER SMOKE EVAC LAPAROSHD (FILTER) ×1 IMPLANT
FORMALIN 10 PREFIL 120ML (MISCELLANEOUS) ×2 IMPLANT
GLOVE BIO SURGEON STRL SZ7.5 (GLOVE) ×2 IMPLANT
GLOVE BIOGEL PI IND STRL 7.0 (GLOVE) IMPLANT
GLOVE BIOGEL PI IND STRL 7.5 (GLOVE) IMPLANT
GLOVE BIOGEL PI INDICATOR 7.0 (GLOVE) ×2
GLOVE BIOGEL PI INDICATOR 7.5 (GLOVE) ×2
GLOVE ECLIPSE 7.0 STRL STRAW (GLOVE) ×1 IMPLANT
GLOVE EXAM NITRILE MD LF STRL (GLOVE) ×1 IMPLANT
GLOVE SS BIOGEL STRL SZ 6.5 (GLOVE) IMPLANT
GLOVE SUPERSENSE BIOGEL SZ 6.5 (GLOVE) ×1
GOWN STRL REIN XL XLG (GOWN DISPOSABLE) ×7 IMPLANT
HEMOSTAT SNOW SURGICEL 2X4 (HEMOSTASIS) ×2 IMPLANT
INST SET LAPROSCOPIC AP (KITS) ×2 IMPLANT
KIT ROOM TURNOVER APOR (KITS) ×2 IMPLANT
KIT TROCAR LAP CHOLE (TROCAR) ×2 IMPLANT
MANIFOLD NEPTUNE II (INSTRUMENTS) ×2 IMPLANT
NDL HYPO 21X1.5 SAFETY (NEEDLE) IMPLANT
NEEDLE HYPO 21X1.5 SAFETY (NEEDLE) ×2 IMPLANT
NS IRRIG 1000ML POUR BTL (IV SOLUTION) ×2 IMPLANT
PACK LAP CHOLE LZT030E (CUSTOM PROCEDURE TRAY) ×2 IMPLANT
PAD ARMBOARD 7.5X6 YLW CONV (MISCELLANEOUS) ×2 IMPLANT
POUCH SPECIMEN RETRIEVAL 10MM (ENDOMECHANICALS) ×2 IMPLANT
SET BASIN LINEN APH (SET/KITS/TRAYS/PACK) ×2 IMPLANT
SPONGE GAUZE 2X2 8PLY STRL LF (GAUZE/BANDAGES/DRESSINGS) ×8 IMPLANT
STAPLER VISISTAT (STAPLE) ×2 IMPLANT
SUT VICRYL 0 UR6 27IN ABS (SUTURE) ×2 IMPLANT
SYR CONTROL 10ML LL (SYRINGE) ×1 IMPLANT
TAPE CLOTH SURG 4X10 WHT LF (GAUZE/BANDAGES/DRESSINGS) ×1 IMPLANT
WARMER LAPAROSCOPE (MISCELLANEOUS) ×2 IMPLANT
YANKAUER SUCT 12FT TUBE ARGYLE (SUCTIONS) ×2 IMPLANT

## 2012-09-03 NOTE — Anesthesia Postprocedure Evaluation (Signed)
  Anesthesia Post-op Note  Patient: [name]  Procedure(s) Performed: Procedure(s) (LRB) with comments: LAPAROSCOPIC CHOLECYSTECTOMY (N/A)  Patient Location: PACU  Anesthesia Type: General  Level of Consciousness: awake and alert   Airway and Oxygen Therapy: Patient Spontanous Breathing and Patient connected to face mask oxygen  Post-op Pain: mild  Post-op Assessment: Post-op Vital signs reviewed, Patient's Cardiovascular Status Stable, Respiratory Function Stable, Patent Airway and No signs of Nausea or vomiting  Post-op Vital Signs: Reviewed and stable  Complications: No apparent anesthesia complications

## 2012-09-03 NOTE — Anesthesia Preprocedure Evaluation (Signed)
Anesthesia Evaluation  Patient identified by MRN, date of birth, ID band Patient awake    Reviewed: Allergy & Precautions, H&P , NPO status , Patient's Chart, lab work & pertinent test results  History of Anesthesia Complications Negative for: history of anesthetic complications  Airway Mallampati: I      Dental  (+) Edentulous Upper   Pulmonary shortness of breath, asthma , sleep apnea and Continuous Positive Airway Pressure Ventilation ,    + decreased breath sounds      Cardiovascular +CHF DOE: DCCV on 01/2012. + dysrhythmias Rhythm:Irregular Rate:Normal     Neuro/Psych  Headaches, PSYCHIATRIC DISORDERS Anxiety Depression  Neuromuscular disease    GI/Hepatic GERD-  Medicated,  Endo/Other  Hypothyroidism   Renal/GU      Musculoskeletal  (+) Fibromyalgia -  Abdominal (+) + obese,   Peds  Hematology   Anesthesia Other Findings   Reproductive/Obstetrics                           Anesthesia Physical Anesthesia Plan  ASA: III  Anesthesia Plan: General   Post-op Pain Management:    Induction: Intravenous, Rapid sequence and Cricoid pressure planned  Airway Management Planned: Oral ETT  Additional Equipment:   Intra-op Plan:   Post-operative Plan: Extubation in OR  Informed Consent: I have reviewed the patients History and Physical, chart, labs and discussed the procedure including the risks, benefits and alternatives for the proposed anesthesia with the patient or authorized representative who has indicated his/her understanding and acceptance.     Plan Discussed with:   Anesthesia Plan Comments:         Anesthesia Quick Evaluation

## 2012-09-03 NOTE — Progress Notes (Signed)
UR Chart Review Completed  

## 2012-09-03 NOTE — Transfer of Care (Signed)
Immediate Anesthesia Transfer of Care Note  Patient: [name]  Procedure(s) Performed: Procedure(s) (LRB) with comments: LAPAROSCOPIC CHOLECYSTECTOMY (N/A)  Patient Location: PACU  Anesthesia Type: General  Level of Consciousness: awake  Airway & Oxygen Therapy: Patient Spontanous Breathing and Patient connected to face mask oxygen  Post-op Assessment: Report given to PACU RN  Post vital signs: Reviewed  Complications: No apparent anesthesia complications

## 2012-09-03 NOTE — Op Note (Signed)
Patient:  [name]  DOB:  October 25, 1941  MRN:  [id number]   Preop Diagnosis:  Cholecystitis, cholelithiasis  Postop Diagnosis:  Same  Procedure:  Laparoscopic cholecystectomy  Surgeon:  Franky Macho, M.D.  Anes:  General endotracheal  Indications:  Patient is a 71 year old morbidly obese white female with multiple medical problems who presents with cholecystitis secondary to cholelithiasis. The risks and benefits of the procedure including bleeding, infection, hepatobiliary injury, cardiopulmonary difficulties, possibly of an open procedure were fully explained to the patient, gave informed consent.  Procedure note:  Patient is placed the supine position. After induction of general endotracheal anesthesia, the abdomen was prepped and draped using usual sterile technique with DuraPrep. Surgical site confirmation was performed.  A supraumbilical incision was made down to the fascia. A Veress needle was introduced into the abdominal cavity and confirmation of placement was done using the saline drop test. The abdomen was then insufflated to 16 mm mercury pressure. An 11 mm trocar was introduced into the abdominal cavity under direct visualization the difficulty. Patient is placed in reverse Trendelenburg position additional 11 mm trocar was placed the epigastric region and 5 mm trochars were placed the right upper quadrant and right flank regions. The liver was inspected and noted to be somewhat fatty in nature. The gallbladder was noted to be distended with a thickened gallbladder wall. An incision was made into the top portion of the lumen of the gallbladder. Hydrops was found. This was evacuated with suction. This allowed the gallbladder to be manipulated. The gallbladder was retracted superiorly and laterally. Dissection was begun around the infundibulum of the gallbladder. The cystic duct was first identified. Its junction to the infundibulum flow identified. In order to facilitate further  exposure, a dome down approach was then performed in or to isolate both the cystic artery the cystic duct. Cystic artery was ligated and divided using clips. Once the cystic duct was fully identified with this juncture at the infundibulum, both a vascular Endo GIA and several clips were placed across it. The gallbladder was then removed from the operative field using an Endo Catch bag and sent to pathology further examination. Some bleeding was noted along the gallbladder bed was controlled using Bovie electrocautery. The right upper quadrant and gallbladder bed regions were copious irrigated normal saline. No bile is noted to be leaking from the cystic duct stump at the end of the procedure. Surgicel is placed the gallbladder fossa. All fluid and air were then evacuated from the abdominal cavity prior to removal of the trochars.  All wounds were gave normal saline. All wounds were injected with 0.5% Sensorcaine. The suprabuccal fashion as well as epigastric fascia were reapproximated using 0 Vicryl interrupted sutures. All skin incisions were closed using staples. Betadine ointment and dressed a dressings were applied.  All tape and needle counts were correct at the end of the procedure. Patient was extubated in the operating room and went back to recovery room awake in stable condition.  Complications:  None  EBL:  50 cc  Specimen:  Gallbladder

## 2012-09-03 NOTE — Anesthesia Procedure Notes (Signed)
Procedure Name: Intubation Date/Time: 09/03/2012 10:13 AM Performed by: Glynn Octave E Pre-anesthesia Checklist: Patient identified, Patient being monitored, Timeout performed, Emergency Drugs available and Suction available Patient Re-evaluated:Patient Re-evaluated prior to inductionOxygen Delivery Method: Circle System Utilized Preoxygenation: Pre-oxygenation with 100% oxygen Intubation Type: IV induction, Rapid sequence and Cricoid Pressure applied Ventilation: Mask ventilation without difficulty Laryngoscope Size: Mac and 3 Grade View: Grade I Tube type: Oral Tube size: 7.0 mm Number of attempts: 1 Airway Equipment and Method: stylet Placement Confirmation: ETT inserted through vocal cords under direct vision,  positive ETCO2 and breath sounds checked- equal and bilateral Secured at: 21 cm Tube secured with: Tape Dental Injury: Teeth and Oropharynx as per pre-operative assessment

## 2012-09-04 DIAGNOSIS — I509 Heart failure, unspecified: Secondary | ICD-10-CM | POA: Diagnosis not present

## 2012-09-04 DIAGNOSIS — I5030 Unspecified diastolic (congestive) heart failure: Secondary | ICD-10-CM | POA: Diagnosis not present

## 2012-09-04 DIAGNOSIS — I4891 Unspecified atrial fibrillation: Secondary | ICD-10-CM | POA: Diagnosis not present

## 2012-09-04 LAB — COMPREHENSIVE METABOLIC PANEL
ALT: 29 U/L (ref 0–35)
AST: 36 U/L (ref 0–37)
Albumin: 2.7 g/dL — ABNORMAL LOW (ref 3.5–5.2)
Alkaline Phosphatase: 87 U/L (ref 39–117)
CO2: 29 mEq/L (ref 19–32)
Chloride: 103 mEq/L (ref 96–112)
GFR calc non Af Amer: 88 mL/min — ABNORMAL LOW (ref >90)
Potassium: 4.1 mEq/L (ref 3.5–5.1)
Sodium: 141 mEq/L (ref 135–145)
Total Bilirubin: 0.3 mg/dL (ref 0.3–1.2)

## 2012-09-04 LAB — CBC
HCT: 35.7 % — ABNORMAL LOW (ref 36.0–46.0)
Hemoglobin: 10.9 g/dL — ABNORMAL LOW (ref 12.0–15.0)
MCHC: 30.5 g/dL (ref 30.0–36.0)
MCV: 90.2 fL (ref 78.0–100.0)
RDW: 14.2 % (ref 11.5–15.5)

## 2012-09-04 LAB — PHOSPHORUS: Phosphorus: 3.5 mg/dL (ref 2.3–4.6)

## 2012-09-04 LAB — MAGNESIUM: Magnesium: 1.8 mg/dL (ref 1.5–2.5)

## 2012-09-04 MED ORDER — OXYCODONE-ACETAMINOPHEN 7.5-325 MG PO TABS: 1.0000 | ORAL_TABLET | ORAL | Status: DC | PRN

## 2012-09-04 NOTE — Addendum Note (Signed)
Addendum  created 09/04/12 1000 by Marolyn Hammock, CRNA   Modules edited:Notes Section

## 2012-09-04 NOTE — Discharge Summary (Signed)
Physician Discharge Summary  Patient ID: [name] MRN: [id number] DOB/AGE: October 18, 1941 71 y.o.  Admit date: 08/31/2012 Discharge date: 09/04/2012  Admission Diagnoses: Cholecystitis, cholelithiasis  Discharge Diagnoses: Same Active Problems:  OBESITY, MORBID  OBSTRUCTIVE SLEEP APNEA  Cholelithiasis  Atrial fibrillation  Diastolic CHF   Discharged Condition: good  Hospital Course: Patient is a 71 year old white female multiple medical problems who presented to emergency room with worsening right upper quadrant abdominal pain. She is known history of cholecystitis with cholelithiasis as well as chronic cholecystitis. She was admitted to the hospital for control of her pain and nausea. Cardiology consultation was obtained due to her history of chronic atrial fibrillation. She was cleared by cardiology for surgery. She subsequently underwent laparoscopic cholecystectomy on 09/03/2012. She tolerated the surgery well. Postoperative course has been unremarkable. Her chronic atrial fibrillation has been under control. Her breathing has not been a problem. Her labs were all within normal limits on postoperative day one. She is being discharged home in good improving condition.  Consults: cardiology  Treatments: surgery: Laparoscopic cholecystectomy on 09/03/2012  Discharge Exam: Blood pressure 94/57, pulse 70, temperature 97.8 F (36.6 C), temperature source Oral, resp. rate 18, height 5\' 2"  (1.575 m), weight 135.626 kg (299 lb), SpO2 96.00%. General appearance: alert and cooperative Resp: clear to auscultation bilaterally Cardio: irregularly irregular rhythm GI: Soft. Dressings dry and intact.  Disposition: 01-Home or Self Care     Medication List     As of 09/04/2012  8:41 AM    STOP taking these medications         oxyCODONE-acetaminophen 5-325 MG per tablet   Commonly known as: PERCOCET/ROXICET      TAKE these medications         albuterol 90 MCG/ACT inhaler   Commonly  known as: PROVENTIL,VENTOLIN   Inhale 2 puffs into the lungs every 6 (six) hours as needed. For shortness of breath      dabigatran 150 MG Caps   Commonly known as: PRADAXA   Take 150 mg by mouth every 12 (twelve) hours.      digoxin 0.25 MG tablet   Commonly known as: LANOXIN   Take 250 mcg by mouth daily.      enoxaparin 120 MG/0.8ML injection   Commonly known as: LOVENOX   Inject 120 mg into the skin 2 (two) times daily.      esomeprazole 40 MG capsule   Commonly known as: NEXIUM   Take 40 mg by mouth daily before breakfast.      FLUoxetine 20 MG capsule   Commonly known as: PROZAC   Take 60 mg by mouth daily.      fluticasone 50 MCG/ACT nasal spray   Commonly known as: FLONASE   Place 2 sprays into the nose daily as needed. For allergies      levothyroxine 50 MCG tablet   Commonly known as: SYNTHROID, LEVOTHROID   Take 50 mcg by mouth daily.      LORazepam 1 MG tablet   Commonly known as: ATIVAN   Take 1.5 mg by mouth 3 (three) times daily.      miconazole 2 % vaginal cream   Commonly known as: MONISTAT 7   Place 1 applicator vaginally at bedtime as needed. Yeast infection      montelukast 10 MG tablet   Commonly known as: SINGULAIR   Take 10 mg by mouth at bedtime.      ondansetron 8 MG disintegrating tablet   Commonly known as: ZOFRAN-ODT  Take 1 tablet (8 mg total) by mouth once.      oxyCODONE-acetaminophen 7.5-325 MG per tablet   Commonly known as: PERCOCET   Take 1 tablet by mouth every 4 (four) hours as needed for pain.      potassium chloride SA 20 MEQ tablet   Commonly known as: K-DUR,KLOR-CON   Take 40 mEq by mouth daily as needed. Take with torsemide      propranolol 80 MG 24 hr capsule   Commonly known as: INNOPRAN XL   Take 80 mg by mouth every morning.      propranolol 80 MG 24 hr capsule   Commonly known as: INNOPRAN XL   Take 80 mg by mouth every morning.      torsemide 20 MG tablet   Commonly known as: DEMADEX   Take 40 mg by  mouth daily as needed. swelling           Follow-up Information    Follow up with Dalia Heading, MD. Schedule an appointment as soon as possible for a visit on 09/11/2012.   Contact information:   [address] [place] [address] [phone]          Signed: Franky Macho A 09/04/2012, 8:41 AM

## 2012-09-04 NOTE — Progress Notes (Signed)
Discharge instructions given to pt. & pt's husband with teach back given to RN. Pt. Taken to car via W/C.

## 2012-09-04 NOTE — Anesthesia Postprocedure Evaluation (Signed)
  Anesthesia Post-op Note  Patient: [name]  Procedure(s) Performed: Procedure(s) (LRB) with comments: LAPAROSCOPIC CHOLECYSTECTOMY (N/A)  Patient Location: Room 310  Anesthesia Type: General  Level of Consciousness: awake, alert , oriented and patient cooperative  Airway and Oxygen Therapy: Patient Spontanous Breathing and Patient connected to face mask oxygen  Post-op Pain: mild  Post-op Assessment: Post-op Vital signs reviewed, Patient's Cardiovascular Status Stable, Respiratory Function Stable, Patent Airway and No signs of Nausea or vomiting  Post-op Vital Signs: Reviewed and stable  Complications: No apparent anesthesia complications

## 2012-09-04 NOTE — Progress Notes (Signed)
Subjective:  Feels well. Sitting up without much pain. No cardiac complaints  Objective:  Vital Signs in the last 24 hours: Temp:  [97.5 F (36.4 C)-98.5 F (36.9 C)] 97.8 F (36.6 C) (10/10 0532) Pulse Rate:  [58-79] 70  (10/10 0532) Resp:  [13-24] 18  (10/10 0532) BP: (84-124)/(40-78) 94/57 mmHg (10/10 0532) SpO2:  [91 %-100 %] 96 % (10/10 0532) Weight:  [299 lb (135.626 kg)] 299 lb (135.626 kg) (10/10 0500)  Intake/Output from previous day: 10/09 0701 - 10/10 0700 In: 1555 [P.O.:240; I.V.:1315] Out: 1200 [Urine:1150; Blood:50] Physical Exam: NECK: Without JVD, HJR, or bruit LUNGS: Clear anterior, posterior, lateral HEART: irregular rate and rhythm, no murmur, gallop, rub, bruit, thrill, or heave EXTREMITIES: Without cyanosis, clubbing, or edema  Lab Results:  [facility] 09/04/12 0532  WBC 10.3  HGB 10.9*  PLT 240    Basename 09/04/12 0532  NA 141  K 4.1  CL 103  CO2 29  GLUCOSE 112*  BUN 5*  CREATININE 0.64   Basename 09/04/12 0532  PROT 6.1  ALBUMIN 2.7*  AST 36  ALT 29  ALKPHOS 87  BILITOT 0.3  BILIDIR --  IBILI --   Imaging: Per Dr. Verl Dicker office note:  Echo 01/09/12 - normal LVEF, severe biatrial enlargement, mild pulmonary hypertension Lexiscan stress 01/11/12: gut uptake artifact. No definite ischemia. EF 53%. Low risk.   Assessment/Plan:  1.Postop day #1 status post laparoscopic cholecystectomy for Cholelithasis and chronic cholecystitis 2. Atrial fibrillation, s/p failed cardioversion 01/2012, rate controlled. Chronic volume overload likely related to chronic diastolic CHF 4. Morbid obesity BMI 53.6 5. OSA on CPAP with ?possible component of obesity hypoventilation syndrome 6. Hyperglycemia 7. Low risk nuclear stress test in February of this year and has had no significant interim change in her cardiac status 8. Possible chronic diastolic CHF  Jacolyn Reedy 09/04/2012, 8:24 AM  Cardiology Attending Patient interviewed and examined.  Discussed with Jacolyn Reedy, PA-C.  Above note annotated and modified based upon my findings.  Patient has done well perioperatively without apparent cardiac complications.  Discharge is planned for today.  Patient's usual cardiac medications can be continued.  Treatment with Lovenox is not necessary, as onset of anticoagulant effect with dabigatran is rapid.  Lenexa Bing, MD 09/04/2012

## 2012-09-08 DIAGNOSIS — G4733 Obstructive sleep apnea (adult) (pediatric): Secondary | ICD-10-CM | POA: Diagnosis not present

## 2012-09-08 DIAGNOSIS — I4891 Unspecified atrial fibrillation: Secondary | ICD-10-CM | POA: Diagnosis not present

## 2012-09-08 DIAGNOSIS — I10 Essential (primary) hypertension: Secondary | ICD-10-CM | POA: Diagnosis not present

## 2012-09-08 DIAGNOSIS — R0609 Other forms of dyspnea: Secondary | ICD-10-CM | POA: Diagnosis not present

## 2012-09-09 ENCOUNTER — Encounter (HOSPITAL_COMMUNITY): Payer: Self-pay | Admitting: General Surgery

## 2012-09-30 ENCOUNTER — Inpatient Hospital Stay (HOSPITAL_COMMUNITY)
Admission: EM | Admit: 2012-09-30 | Discharge: 2012-10-03 | DRG: 445 | Disposition: A | Discharge status: Home / Self Care | Payer: Medicare Other | Attending: General Surgery | Admitting: General Surgery

## 2012-09-30 ENCOUNTER — Encounter (HOSPITAL_COMMUNITY): Payer: Self-pay | Admitting: Emergency Medicine

## 2012-09-30 ENCOUNTER — Emergency Department (HOSPITAL_COMMUNITY): Payer: Medicare Other

## 2012-09-30 DIAGNOSIS — F411 Generalized anxiety disorder: Secondary | ICD-10-CM | POA: Diagnosis present

## 2012-09-30 DIAGNOSIS — Z87891 Personal history of nicotine dependence: Secondary | ICD-10-CM

## 2012-09-30 DIAGNOSIS — K8050 Calculus of bile duct without cholangitis or cholecystitis without obstruction: Secondary | ICD-10-CM

## 2012-09-30 DIAGNOSIS — R1011 Right upper quadrant pain: Secondary | ICD-10-CM | POA: Diagnosis not present

## 2012-09-30 DIAGNOSIS — K7689 Other specified diseases of liver: Secondary | ICD-10-CM | POA: Diagnosis present

## 2012-09-30 DIAGNOSIS — R935 Abnormal findings on diagnostic imaging of other abdominal regions, including retroperitoneum: Secondary | ICD-10-CM | POA: Diagnosis not present

## 2012-09-30 DIAGNOSIS — K219 Gastro-esophageal reflux disease without esophagitis: Secondary | ICD-10-CM | POA: Diagnosis present

## 2012-09-30 DIAGNOSIS — Z6841 Body Mass Index (BMI) 40.0 and over, adult: Secondary | ICD-10-CM

## 2012-09-30 DIAGNOSIS — G43909 Migraine, unspecified, not intractable, without status migrainosus: Secondary | ICD-10-CM | POA: Diagnosis present

## 2012-09-30 DIAGNOSIS — E785 Hyperlipidemia, unspecified: Secondary | ICD-10-CM | POA: Diagnosis present

## 2012-09-30 DIAGNOSIS — M109 Gout, unspecified: Secondary | ICD-10-CM | POA: Diagnosis present

## 2012-09-30 DIAGNOSIS — F3289 Other specified depressive episodes: Secondary | ICD-10-CM | POA: Diagnosis present

## 2012-09-30 DIAGNOSIS — F329 Major depressive disorder, single episode, unspecified: Secondary | ICD-10-CM | POA: Diagnosis present

## 2012-09-30 DIAGNOSIS — J45909 Unspecified asthma, uncomplicated: Secondary | ICD-10-CM | POA: Diagnosis present

## 2012-09-30 DIAGNOSIS — K8051 Calculus of bile duct without cholangitis or cholecystitis with obstruction: Principal | ICD-10-CM | POA: Diagnosis present

## 2012-09-30 DIAGNOSIS — I4891 Unspecified atrial fibrillation: Secondary | ICD-10-CM | POA: Diagnosis present

## 2012-09-30 DIAGNOSIS — R109 Unspecified abdominal pain: Secondary | ICD-10-CM

## 2012-09-30 DIAGNOSIS — Z79899 Other long term (current) drug therapy: Secondary | ICD-10-CM

## 2012-09-30 DIAGNOSIS — M1990 Unspecified osteoarthritis, unspecified site: Secondary | ICD-10-CM | POA: Diagnosis present

## 2012-09-30 DIAGNOSIS — G4733 Obstructive sleep apnea (adult) (pediatric): Secondary | ICD-10-CM | POA: Diagnosis present

## 2012-09-30 DIAGNOSIS — E039 Hypothyroidism, unspecified: Secondary | ICD-10-CM | POA: Diagnosis present

## 2012-09-30 DIAGNOSIS — IMO0001 Reserved for inherently not codable concepts without codable children: Secondary | ICD-10-CM | POA: Diagnosis present

## 2012-09-30 LAB — CBC WITH DIFFERENTIAL/PLATELET
Basophils Relative: 1 % (ref 0–1)
Eosinophils Absolute: 0.3 10*3/uL (ref 0.0–0.7)
Eosinophils Relative: 5 % (ref 0–5)
Hemoglobin: 12.3 g/dL (ref 12.0–15.0)
Lymphs Abs: 0.7 10*3/uL (ref 0.7–4.0)
MCH: 27.5 pg (ref 26.0–34.0)
MCHC: 31.3 g/dL (ref 30.0–36.0)
MCV: 87.9 fL (ref 78.0–100.0)
Monocytes Relative: 8 % (ref 3–12)
Neutrophils Relative %: 72 % (ref 43–77)
Platelets: 255 10*3/uL (ref 150–400)
RBC: 4.47 MIL/uL (ref 3.87–5.11)

## 2012-09-30 LAB — HEPATIC FUNCTION PANEL
ALT: 284 U/L — ABNORMAL HIGH (ref 0–35)
AST: 372 U/L — ABNORMAL HIGH (ref 0–37)
Albumin: 3 g/dL — ABNORMAL LOW (ref 3.5–5.2)
Bilirubin, Direct: 0.5 mg/dL — ABNORMAL HIGH (ref 0.0–0.3)
Total Bilirubin: 0.9 mg/dL (ref 0.3–1.2)

## 2012-09-30 LAB — URINALYSIS, ROUTINE W REFLEX MICROSCOPIC
Leukocytes, UA: NEGATIVE
Nitrite: NEGATIVE
Specific Gravity, Urine: 1.01 (ref 1.005–1.030)
Urobilinogen, UA: 1 mg/dL (ref 0.0–1.0)
pH: 7 (ref 5.0–8.0)

## 2012-09-30 LAB — COMPREHENSIVE METABOLIC PANEL
Albumin: 3.3 g/dL — ABNORMAL LOW (ref 3.5–5.2)
BUN: 10 mg/dL (ref 6–23)
Calcium: 9.2 mg/dL (ref 8.4–10.5)
GFR calc Af Amer: >90 mL/min (ref >90)
Glucose, Bld: 147 mg/dL — ABNORMAL HIGH (ref 70–99)
Total Protein: 7.4 g/dL (ref 6.0–8.3)

## 2012-09-30 LAB — LIPASE, BLOOD: Lipase: 57 U/L (ref 11–59)

## 2012-09-30 MED ORDER — FLUOXETINE HCL 20 MG PO CAPS
60.0000 mg | ORAL_CAPSULE | Freq: Every day | ORAL | Status: DC
  Administered 2012-09-30 – 2012-10-03 (×3): 60 mg via ORAL
  Filled 2012-09-30 (×3): qty 3

## 2012-09-30 MED ORDER — SODIUM CHLORIDE 0.9 % IV SOLN
Freq: Once | INTRAVENOUS | Status: AC
  Administered 2012-09-30: 1000 mL via INTRAVENOUS

## 2012-09-30 MED ORDER — DIPHENHYDRAMINE HCL 12.5 MG/5ML PO ELIX: 12.5000 mg | ORAL_SOLUTION | Freq: Four times a day (QID) | ORAL | Status: DC | PRN

## 2012-09-30 MED ORDER — MICONAZOLE NITRATE 2 % VA CREA
1.0000 | TOPICAL_CREAM | Freq: Every day | VAGINAL | Status: DC
  Administered 2012-09-30: 1 via VAGINAL
  Filled 2012-09-30: qty 45

## 2012-09-30 MED ORDER — OXYCODONE-ACETAMINOPHEN 5-325 MG PO TABS
1.5000 | ORAL_TABLET | ORAL | Status: DC | PRN
  Administered 2012-09-30 – 2012-10-02 (×3): 1.5 via ORAL
  Filled 2012-09-30 (×4): qty 2

## 2012-09-30 MED ORDER — OXYCODONE-ACETAMINOPHEN 7.5-325 MG PO TABS: 1.0000 | ORAL_TABLET | ORAL | Status: DC | PRN

## 2012-09-30 MED ORDER — ONDANSETRON HCL 4 MG/2ML IJ SOLN
4.0000 mg | Freq: Four times a day (QID) | INTRAMUSCULAR | Status: DC | PRN
  Administered 2012-10-01: 4 mg via INTRAVENOUS

## 2012-09-30 MED ORDER — LORAZEPAM 1 MG PO TABS
1.5000 mg | ORAL_TABLET | Freq: Three times a day (TID) | ORAL | Status: DC
  Administered 2012-09-30 – 2012-10-03 (×8): 1.5 mg via ORAL
  Filled 2012-09-30 (×2): qty 1
  Filled 2012-09-30: qty 3
  Filled 2012-09-30 (×5): qty 1

## 2012-09-30 MED ORDER — KCL IN DEXTROSE-NACL 20-5-0.45 MEQ/L-%-% IV SOLN
INTRAVENOUS | Status: DC
  Administered 2012-09-30 – 2012-10-01 (×3): via INTRAVENOUS
  Administered 2012-10-02: 20 mL/h via INTRAVENOUS
  Administered 2012-10-02: 04:00:00 via INTRAVENOUS

## 2012-09-30 MED ORDER — ONDANSETRON HCL 4 MG/2ML IJ SOLN
4.0000 mg | Freq: Once | INTRAMUSCULAR | Status: AC
  Administered 2012-09-30: 4 mg via INTRAVENOUS
  Filled 2012-09-30: qty 2

## 2012-09-30 MED ORDER — LEVOTHYROXINE SODIUM 50 MCG PO TABS
50.0000 ug | ORAL_TABLET | Freq: Every day | ORAL | Status: DC
  Administered 2012-10-01 – 2012-10-03 (×3): 50 ug via ORAL
  Filled 2012-09-30 (×3): qty 1

## 2012-09-30 MED ORDER — ALBUTEROL 90 MCG/ACT IN AERS: 2.0000 | INHALATION_SPRAY | Freq: Four times a day (QID) | RESPIRATORY_TRACT | Status: DC | PRN

## 2012-09-30 MED ORDER — ACETAMINOPHEN 650 MG RE SUPP: 650.0000 mg | Freq: Four times a day (QID) | RECTAL | Status: DC | PRN

## 2012-09-30 MED ORDER — GADOBENATE DIMEGLUMINE 529 MG/ML IV SOLN
20.0000 mL | Freq: Once | INTRAVENOUS | Status: AC | PRN
  Administered 2012-09-30: 20 mL via INTRAVENOUS

## 2012-09-30 MED ORDER — PROPRANOLOL HCL ER BEADS 80 MG PO CP24: 80.0000 mg | ORAL_CAPSULE | Freq: Every morning | ORAL | Status: DC

## 2012-09-30 MED ORDER — MONTELUKAST SODIUM 10 MG PO TABS
10.0000 mg | ORAL_TABLET | Freq: Every day | ORAL | Status: DC
  Administered 2012-09-30 – 2012-10-02 (×3): 10 mg via ORAL
  Filled 2012-09-30 (×2): qty 1

## 2012-09-30 MED ORDER — PROPRANOLOL HCL ER 80 MG PO CP24
80.0000 mg | ORAL_CAPSULE | Freq: Every day | ORAL | Status: DC
  Administered 2012-10-02: 80 mg via ORAL
  Filled 2012-09-30 (×3): qty 1

## 2012-09-30 MED ORDER — DIPHENHYDRAMINE HCL 50 MG/ML IJ SOLN: 12.5000 mg | Freq: Four times a day (QID) | INTRAMUSCULAR | Status: DC | PRN

## 2012-09-30 MED ORDER — FLUTICASONE PROPIONATE 50 MCG/ACT NA SUSP
2.0000 | Freq: Every day | NASAL | Status: DC | PRN
  Filled 2012-09-30: qty 16

## 2012-09-30 MED ORDER — MORPHINE SULFATE 2 MG/ML IJ SOLN
INTRAMUSCULAR | Status: AC
  Filled 2012-09-30: qty 1

## 2012-09-30 MED ORDER — HYDROMORPHONE HCL PF 1 MG/ML IJ SOLN
1.0000 mg | Freq: Once | INTRAMUSCULAR | Status: AC
  Administered 2012-09-30: 1 mg via INTRAVENOUS
  Filled 2012-09-30: qty 1

## 2012-09-30 MED ORDER — ACETAMINOPHEN 325 MG PO TABS
650.0000 mg | ORAL_TABLET | Freq: Four times a day (QID) | ORAL | Status: DC | PRN
  Administered 2012-10-02: 650 mg via ORAL
  Filled 2012-09-30: qty 2

## 2012-09-30 MED ORDER — ALBUTEROL SULFATE HFA 108 (90 BASE) MCG/ACT IN AERS: 2.0000 | INHALATION_SPRAY | Freq: Four times a day (QID) | RESPIRATORY_TRACT | Status: DC | PRN

## 2012-09-30 MED ORDER — CLOTRIMAZOLE 1 % VA CREA
1.0000 | TOPICAL_CREAM | Freq: Every day | VAGINAL | Status: DC
  Filled 2012-09-30: qty 45

## 2012-09-30 MED ORDER — MORPHINE SULFATE 2 MG/ML IJ SOLN
1.0000 mg | INTRAMUSCULAR | Status: DC | PRN
  Administered 2012-09-30: 1 mg via INTRAVENOUS
  Filled 2012-09-30: qty 1

## 2012-09-30 MED ORDER — IOHEXOL 300 MG/ML  SOLN
100.0000 mL | Freq: Once | INTRAMUSCULAR | Status: AC | PRN
  Administered 2012-09-30: 100 mL via INTRAVENOUS

## 2012-09-30 MED ORDER — DIGOXIN 250 MCG PO TABS
250.0000 ug | ORAL_TABLET | Freq: Every day | ORAL | Status: DC
  Administered 2012-09-30 – 2012-10-01 (×2): 250 ug via ORAL
  Filled 2012-09-30 (×3): qty 1

## 2012-09-30 NOTE — ED Provider Notes (Signed)
History  This chart was scribed for Jennifer Lennert, MD by Ladona Ridgel Day. This patient was seen in room [facility]14/[facility]14 and the patient's care was started at [phone].   CSN: [id number]  Arrival date & time 09/30/12  1191   First MD Initiated Contact with Patient 09/30/12 [phone]      Chief Complaint  Patient presents with  . Abdominal Pain  . Nausea   Patient is a 71 y.o. female presenting with abdominal pain. The history is provided by the patient. No language interpreter was used.  Abdominal Pain The primary symptoms of the illness include abdominal pain (RUQ abdominal pain). The primary symptoms of the illness do not include fever, shortness of breath, nausea, vomiting or diarrhea. The current episode started 3 to 5 hours ago. The onset of the illness was gradual. The problem has been gradually worsening.  The illness is associated with eating. The patient states that she believes she is currently not pregnant. Risk factors for an acute abdominal problem include being elderly and a history of abdominal surgery. Additional symptoms associated with the illness include chills and back pain. Symptoms associated with the illness do not include constipation.   ALAJIA Barnes is a 71 y.o. female who presents to the Emergency Department complaining of intermittent RUQ abdominal pain which worsened this AM but present over the past 5 days. She states had cholecystectomy on 09/03/2012 and this pain today feels similar to before she had her operation. She states pain radiates from RUQ to her upper back and right shoulder. She states her pain is exacerbated by eating food. She states mild chills but denies emesis, fever, cough.  Her PCP is Dr. Loleta Chance  Past Medical History  Diagnosis Date  . GERD (gastroesophageal reflux disease)   . Fibromyalgia   . Anxiety   . Depression   . Obesity, morbid (more than 100 lbs over ideal weight or BMI > 40)   . Hyperlipemia   . Hypothyroidism   . Asthma   . Obstructive sleep  apnea on CPAP and O2  . Migraine   . Colon polyp     TCS 2005 TICS [place], TCS/PROPOFOL 2011 SIMPLE ADENOMAS  . Allergic rhinitis   . Osteoarthritis   . Palpitations   . Narcolepsy   . Cholecystitis   . Afib     a. s/p DCCV 01/2012 but did not hold.  . Diverticulosis 2005  . Internal hemorrhoids     H/o Rectal bleeding secondary to internal hemorrhoids 04/2010.  . Volume overload     uses torsemide PRN  . Fatty liver   . Gout     Past Surgical History  Procedure Date  . Abdominal hysterectomy FIBROIDS 1982  . Rhinoplasty   . Bladder suspension 1982  . Kidney stent 1995  . Cardioversion 02/05/2012    Procedure: CARDIOVERSION;  Surgeon: Pamella Pert, MD;  Location: [facility] OR;  Service: Cardiovascular;  Laterality: N/A;  . Cholecystectomy 09/03/2012    Procedure: LAPAROSCOPIC CHOLECYSTECTOMY;  Surgeon: Dalia Heading, MD;  Location: [facility];  Service: General;  Laterality: N/A;    Family History  Problem Relation Age of Onset  . Colon cancer Mother   . Diabetes Mother   . Cerebral aneurysm Father     History  Substance Use Topics  . Smoking status: Former Games developer  . Smokeless tobacco: Never Used     Comment: 22 years ago  . Alcohol Use: No    OB History    Grav Para  Term Preterm Abortions TAB SAB Ect Mult Living                  Review of Systems  Constitutional: Positive for chills. Negative for fever.  Respiratory: Negative for cough and shortness of breath.   Gastrointestinal: Positive for abdominal pain (RUQ abdominal pain). Negative for nausea, vomiting, diarrhea and constipation.  Musculoskeletal: Positive for back pain.  Neurological: Negative for weakness.  All other systems reviewed and are negative.    Allergies  Atorvastatin; Demerol; Meperidine hcl; and Zocor  Home Medications   Current Outpatient Rx  Name  Route  Sig  Dispense  Refill  . COLCHICINE 0.6 MG PO TABS   Oral   Take 0.6 mg by mouth daily.         . NON FORMULARY   Oral   Take 5  mg by mouth 2 (two) times daily. Eliquis  (apixaban)   Started this when taken off Pradaxa         . ALBUTEROL 90 MCG/ACT IN AERS   Inhalation   Inhale 2 puffs into the lungs every 6 (six) hours as needed. For shortness of breath         . DABIGATRAN ETEXILATE MESYLATE 150 MG PO CAPS   Oral   Take 150 mg by mouth every 12 (twelve) hours.         Marland Kitchen DIGOXIN 0.25 MG PO TABS   Oral   Take 250 mcg by mouth daily.         Marland Kitchen ENOXAPARIN SODIUM 120 MG/0.8ML [place] SOLN   Subcutaneous   Inject 120 mg into the skin 2 (two) times daily.         Marland Kitchen ESOMEPRAZOLE MAGNESIUM 40 MG PO CPDR   Oral   Take 40 mg by mouth daily before breakfast.           . FLUOXETINE HCL 20 MG PO CAPS   Oral   Take 60 mg by mouth daily.           Marland Kitchen FLUTICASONE PROPIONATE 50 MCG/ACT NA SUSP   Nasal   Place 2 sprays into the nose daily as needed. For allergies         . LEVOTHYROXINE SODIUM 50 MCG PO TABS   Oral   Take 50 mcg by mouth daily.           Marland Kitchen LORAZEPAM 1 MG PO TABS   Oral   Take 1.5 mg by mouth 3 (three) times daily.         Marland Kitchen MICONAZOLE NITRATE 2 % VA CREA   Vaginal   Place 1 applicator vaginally at bedtime as needed. Yeast infection         . MONTELUKAST SODIUM 10 MG PO TABS   Oral   Take 10 mg by mouth at bedtime.         Marland Kitchen ONDANSETRON 8 MG PO TBDP   Oral   Take 1 tablet (8 mg total) by mouth once.   20 tablet   0   . OXYCODONE-ACETAMINOPHEN 7.5-325 MG PO TABS   Oral   Take 1 tablet by mouth every 4 (four) hours as needed for pain.   50 tablet   0   . POTASSIUM CHLORIDE CRYS ER 20 MEQ PO TBCR   Oral   Take 40 mEq by mouth daily as needed. Take with torsemide         . PROPRANOLOL HCL ER BEADS 80 MG PO CP24  Oral   Take 80 mg by mouth every morning.          Marland Kitchen PROPRANOLOL HCL ER BEADS 80 MG PO CP24   Oral   Take 80 mg by mouth every morning.         . TORSEMIDE 20 MG PO TABS   Oral   Take 40 mg by mouth daily as needed. swelling            Triage Vitals: BP 97/53  Pulse 67  Temp 98.3 F (36.8 C) (Oral)  Resp 18  Ht 5\' 2"  (1.575 m)  Wt 280 lb (127.007 kg)  BMI 51.21 kg/m2  SpO2 97%  Physical Exam  Nursing note and vitals reviewed. Constitutional: She is oriented to person, place, and time. She appears well-developed and well-nourished. No distress.       Morbidly obese  HENT:  Head: Normocephalic and atraumatic.  Eyes: Conjunctivae normal and EOM are normal.  Neck: Neck supple. No tracheal deviation present.  Cardiovascular: Normal rate, regular rhythm and normal heart sounds.   No murmur heard. Pulmonary/Chest: Effort normal and breath sounds normal. No respiratory distress. She has no wheezes. She has no rales.  Abdominal: Soft. Bowel sounds are normal. She exhibits no distension. There is tenderness (RUQ tenderness). There is no rebound and no guarding.  Musculoskeletal: Normal range of motion.  Neurological: She is alert and oriented to person, place, and time.  Skin: Skin is warm and dry.  Psychiatric: She has a normal mood and affect. Her behavior is normal.    ED Course  Procedures (including critical care time) DIAGNOSTIC STUDIES: Oxygen Saturation is 97% on room air, normal by my interpretation.    COORDINATION OF CARE: At 715 AM Discussed treatment plan with patient which includes pain/nausea medicine, IV fluids, UA, blood work. Patient agrees.   Labs Reviewed - No data to display No results found.   No diagnosis found.  Dr. Lovell Sheehan to admit  MDM  The chart was scribed for me under my direct supervision.  I personally performed the history, physical, and medical decision making and all procedures in the evaluation of this patient.Jennifer Lennert, MD 09/30/12 [phone]

## 2012-09-30 NOTE — ED Notes (Signed)
Patient complaining of right sided abdominal pain radiating into the back x 5 days. States she had her gall bladder removed 1 month ago and this feels the same as before it was removed. Also complaining of nausea.

## 2012-09-30 NOTE — ED Notes (Signed)
Pt briefly awake while O2 being applied. Denies pain or nausea at this time. o2 at 2L applied due to decreased O2 sats after being medicated. NAD.

## 2012-09-30 NOTE — H&P (Signed)
[name] is an 71 y.o. female.   Chief Complaint: Upper abdominal pain and nausea HPI: Patient is a 71 year old white female with multiple medical problems who is status post laparoscopic cholecystectomy for cholelithiasis on 09/03/2012 who presents with intermittent epigastric and right upper quadrant abdominal pain over the past few days. She presented the emergency room as the pain worsened. Liver enzyme tests were noted to be elevated with a total bilirubin of 1.6, direct of 0.7. CT scan of the abdomen revealed minimal fluid collection in the subhepatic space, but no significant abscess or enlarged fluid collection noted. Hepatobiliary tree was within normal limits. An MRCP was performed which revealed choledocholithiasis.  Past Medical History  Diagnosis Date  . GERD (gastroesophageal reflux disease)   . Fibromyalgia   . Anxiety   . Depression   . Obesity, morbid (more than 100 lbs over ideal weight or BMI > 40)   . Hyperlipemia   . Hypothyroidism   . Asthma   . Obstructive sleep apnea on CPAP and O2  . Migraine   . Colon polyp     TCS 2005 TICS [place], TCS/PROPOFOL 2011 SIMPLE [place]ENOMAS  . Allergic rhinitis   . Osteoarthritis   . Palpitations   . Narcolepsy   . Cholecystitis   . Afib     a. s/p DCCV 01/2012 but did not hold.  . Diverticulosis 2005  . Internal hemorrhoids     H/o Rectal bleeding secondary to internal hemorrhoids 04/2010.  . Volume overload     uses torsemide PRN  . Fatty liver   . Gout     Past Surgical History  Procedure Date  . Abdominal hysterectomy FIBROIDS 1982  . Rhinoplasty   . Bladder suspension 1982  . Kidney stent 1995  . Cardioversion 02/05/2012    Procedure: CARDIOVERSION;  Surgeon: Pamella Pert, MD;  Location: [facility] OR;  Service: Cardiovascular;  Laterality: N/A;  . Cholecystectomy 09/03/2012    Procedure: LAPAROSCOPIC CHOLECYSTECTOMY;  Surgeon: Dalia Heading, MD;  Location: [facility];  Service: General;  Laterality: N/A;    Family History    Problem Relation Age of Onset  . Colon cancer Mother   . Diabetes Mother   . Cerebral aneurysm Father    Social History:  reports that she has quit smoking. She has never used smokeless tobacco. She reports that she does not drink alcohol or use illicit drugs.  Allergies:  Allergies  Allergen Reactions  . Atorvastatin Other (See Comments)    Muscle sorness  . Demerol (Meperidine) Other (See Comments)    Bottoms BP.   . Meperidine Hcl Other (See Comments)    Drops BP  . Zocor (Simvastatin - High Dose) Other (See Comments)    Elevated liver enzymes     (Not in a hospital admission)  Results for orders placed during the hospital encounter of 09/30/12 (from the past 48 hour(s))  URINALYSIS, ROUTINE W REFLEX MICROSCOPIC     Status: Normal   Collection Time   09/30/12  7:10 AM      Component Value Range Comment   Color, Urine YELLOW  YELLOW    APPearance CLEAR  CLEAR    Specific Gravity, Urine 1.010  1.005 - 1.030    pH 7.0  5.0 - 8.0    Glucose, UA NEGATIVE  NEGATIVE mg/dL    Hgb urine dipstick NEGATIVE  NEGATIVE    Bilirubin Urine NEGATIVE  NEGATIVE    Ketones, ur NEGATIVE  NEGATIVE mg/dL  Protein, ur NEGATIVE  NEGATIVE mg/dL    Urobilinogen, UA 1.0  0.0 - 1.0 mg/dL    Nitrite NEGATIVE  NEGATIVE    Leukocytes, UA NEGATIVE  NEGATIVE MICROSCOPIC NOT DONE ON URINES WITH NEGATIVE PROTEIN, BLOOD, LEUKOCYTES, NITRITE, OR GLUCOSE <1000 mg/dL.  CBC WITH DIFFERENTIAL     Status: Normal   Collection Time   09/30/12  7:19 AM      Component Value Range Comment   WBC 5.4  4.0 - 10.5 K/uL    RBC 4.47  3.87 - 5.11 MIL/uL    Hemoglobin 12.3  12.0 - 15.0 g/dL    HCT 16.1  09.6 - 04.5 %    MCV 87.9  78.0 - 100.0 fL    MCH 27.5  26.0 - 34.0 pg    MCHC 31.3  30.0 - 36.0 g/dL    RDW 40.9  81.1 - 91.4 %    Platelets 255  150 - 400 K/uL    Neutrophils Relative 72  43 - 77 %    Neutro Abs 3.9  1.7 - 7.7 K/uL    Lymphocytes Relative 13  12 - 46 %    Lymphs Abs 0.7  0.7 - 4.0 K/uL     Monocytes Relative 8  3 - 12 %    Monocytes Absolute 0.5  0.1 - 1.0 K/uL    Eosinophils Relative 5  0 - 5 %    Eosinophils Absolute 0.3  0.0 - 0.7 K/uL    Basophils Relative 1  0 - 1 %    Basophils Absolute 0.0  0.0 - 0.1 K/uL   COMPREHENSIVE METABOLIC PANEL     Status: Abnormal   Collection Time   09/30/12  7:19 AM      Component Value Range Comment   Sodium 140  135 - 145 mEq/L    Potassium 4.0  3.5 - 5.1 mEq/L    Chloride 101  96 - 112 mEq/L    CO2 28  19 - 32 mEq/L    Glucose, Bld 147 (*) 70 - 99 mg/dL    BUN 10  6 - 23 mg/dL    Creatinine, Ser 7.82  0.50 - 1.10 mg/dL    Calcium 9.2  8.4 - 95.6 mg/dL    Total Protein 7.4  6.0 - 8.3 g/dL    Albumin 3.3 (*) 3.5 - 5.2 g/dL    AST 213 (*) 0 - 37 U/L    ALT 259 (*) 0 - 35 U/L    Alkaline Phosphatase 161 (*) 39 - 117 U/L    Total Bilirubin 1.2  0.3 - 1.2 mg/dL    GFR calc non Af Amer 87 (*) >90 mL/min    GFR calc Af Amer >90  >90 mL/min   LIPASE, BLOOD     Status: Normal   Collection Time   09/30/12  7:19 AM      Component Value Range Comment   Lipase 57  11 - 59 U/L   BILIRUBIN, DIRECT     Status: Abnormal   Collection Time   09/30/12 11:00 AM      Component Value Range Comment   Bilirubin, Direct 0.7 (*) 0.0 - 0.3 mg/dL    Ct Abdomen Pelvis W Contrast  09/30/2012  *RADIOLOGY REPORT*  Clinical Data: Abdominal pain, history of colon polyps, history of cholecystitis fatty liver, status post hysterectomy.  CT ABDOMEN AND PELVIS WITH CONTRAST  Technique:  Multidetector CT imaging of the abdomen and pelvis was performed following  the standard protocol during bolus administration of intravenous contrast.  Contrast: OMNIPAQUE IOHEXOL 300 MG/ML  SOLN  Comparison: Abdominal MRI 02/14/2011  Findings: Lung bases are unremarkable.  Sagittal images of the spine shows mild degenerative changes lumbar spine.  Again noted fatty infiltration of the liver.  No intrahepatic biliary ductal dilatation. Surgical clips are noted in gallbladder  region.  There is a 2.8 cm collection with simple fluid in the gallbladder bed region probable postsurgical in nature, seroma or biloma.  No peripheral enhancement to suggest abscess.  The pancreas, spleen and adrenal glands are unremarkable.  Kidneys are symmetrical in size and enhancement.  No focal renal mass.  No hydronephrosis or hydroureter.  Delayed renal images shows bilateral renal symmetrical excretion. Bilateral visualized proximal ureter is unremarkable. Atherosclerotic calcifications are noted abdominal aorta and the iliac arteries.  No aortic aneurysm.  No small bowel obstruction.  No ascites or free air.  No adenopathy.  In axial image 33 there is mild thickening of pyloric wall.  There is no evidence of gastric outlet obstruction. Inflammation or ulcer disease cannot be excluded.  Clinical correlation is necessary.  No pericecal inflammation.  Normal appendix is partially visualized axial image 53.  Multiple sigmoid colon diverticula are noted without evidence of acute diverticulitis.  The patient is status post hysterectomy.  Pelvic phleboliths are noted.  The urinary bladder is unremarkable.  Stool noted rectosigmoid colon.  IMPRESSION:  1.Surgical clips are noted in gallbladder region.  There is a 2.8 cm collection with simple fluid in the gallbladder bed region probable postsurgical in nature, seroma or biloma.  No peripheral enhancement to suggest abscess. 2.  Fatty infiltration of the liver is noted. 3.  No hydronephrosis or hydroureter. 4.  Nonspecific mild thickening of pyloric wall without evidence of gastric outlet obstruction.  Inflammation at this level or ulcer disease cannot be excluded.  Clinical correlation is necessary. 5.  No pericecal inflammation.  Normal appendix.  6.  Status post hysterectomy.   Original Report Authenticated By: Natasha Mead, M.D.     Review of Systems  Constitutional: Positive for malaise/fatigue.  HENT: Negative.   Eyes: Negative.   Respiratory: Negative.    Cardiovascular: Negative.   Gastrointestinal: Positive for nausea and abdominal pain.  Genitourinary: Negative.   Musculoskeletal: Negative.   Skin: Negative.   Neurological: Negative.   Endo/Heme/Allergies: Negative.     Blood pressure 107/45, pulse 102, temperature 98.3 F (36.8 C), temperature source Oral, resp. rate 16, height 5\' 2"  (1.575 m), weight 127.007 kg (280 lb), SpO2 94.00%. Physical Exam  Constitutional: She is oriented to person, place, and time. She appears well-developed and well-nourished.  HENT:  Head: Normocephalic and atraumatic.  Eyes: No scleral icterus.  Neck: Normal range of motion.  Cardiovascular: Normal rate and normal heart sounds.        Irregularly irregular.  Respiratory: Effort normal and breath sounds normal.  GI: Soft. She exhibits no distension. There is tenderness. There is no rebound.       Mildly tender in right upper quadrant to palpation.  Neurological: She is alert and oriented to person, place, and time.     Assessment/Plan Impression: Retained common bile duct stone, status post laparoscopic cholecystectomy on 09/03/2012 Plan: I have discussed this case with Dr. Roetta Sessions of gastroenterology who will proceed with an ERCP. This may have to be delayed as the patient is on a newer anticoagulant for atrial fibrillation. She has been on Pradaxa in the past. The patient  was bridged with Lovenox in the past, though if the procedure is done in next 2448 hours, we may not have to do bridging. This has been explained to the patient, agrees to the treatment plan.  [name] A 09/30/2012, 1:16 PM

## 2012-09-30 NOTE — ED Notes (Signed)
Pt medicated for pain and digoxin was given. Pharmacy to send other meds to floor.

## 2012-09-30 NOTE — ED Notes (Signed)
Pt ambulated to restroom without difficulty. Pt states she wet her pants, which were removed. Pt states pain of 3 but is currently refusing any pain medication at this time, stating that it causes her not to be able to control her bladder. Pt agrees to call for pain med if pain became any worse at all. VSS. Pt requesting her ativan dose. Pt is very drowsy. O2 sat of 94 % on 2L.

## 2012-09-30 NOTE — ED Notes (Signed)
Pt resting.

## 2012-09-30 NOTE — ED Notes (Signed)
Pt back from CT. O2 sats 89% RA. O2 at 2L reapplied. Pt states drowsiness. NAD. Dr. Lovell Sheehan to bedside. VSS with O2 now up to 94% on 2L.

## 2012-09-30 NOTE — Consult Note (Signed)
Referring Provider: Dr. Lovell Sheehan  Primary Care Physician:  Evlyn Courier, MD Primary Gastroenterologist:  Dr. Darrick Penna originally, would like Dr. Jena Gauss   Date of Admission: 09/30/12 Date of Consultation: 09/30/12  Reason for Consultation:  Choledocholithiasis  HPI:  71 year old female known to our office from past, with last office visit in December 2011. Last procedure performed by Dr. Darrick Penna in June 2011: TCS showing internal hemorrhoids, simple adenomas, hyperplastic polyps, due for surveillance June 2014.   Recent cholecystectomy by Dr. Lovell Sheehan September 03, 2012. Pt presented to ED today with RUQ abdominal pain. Exacerbated by eating. CT abd/pelvis without evidence for abscess, noted fatty liver, nonspecific mild thickening of pyloric wall without evidence of GOO, unable to exclude ulcer disease or inflammation. LFTs with significant elevation to include AST 416, ALT 259, AP 161, Tbili 1.2, direct 0.7, which prompted MRCP. MRCP with small stone in distal common bile duct measuring 4.9 mm.   Notes RUQ X 1 week, described as "irritating". Usually occurred after eating. Episode of nausea this morning secondary to severe pain.   Pt with hx of afib and on Pradaxa in past; currently on Eliquis. Last dose yesterday evening. Dosing is BID. Dr. Jacinto Halim cardiologist. [facility]). She was bridged on Lovenox prior to chole.   Past Medical History  Diagnosis Date  . GERD (gastroesophageal reflux disease)   . Fibromyalgia   . Anxiety   . Depression   . Obesity, morbid (more than 100 lbs over ideal weight or BMI > 40)   . Hyperlipemia   . Hypothyroidism   . Asthma   . Obstructive sleep apnea on CPAP and O2  . Migraine   . Colon polyp     TCS 2005 TICS [place], TCS/PROPOFOL 2011 SIMPLE [place]  . Allergic rhinitis   . Osteoarthritis   . Palpitations   . Narcolepsy   . Cholecystitis   . Afib     a. s/p DCCV 01/2012 but did not hold.  . Diverticulosis 2005  . Internal hemorrhoids     H/o Rectal  bleeding secondary to internal hemorrhoids 04/2010.  . Volume overload     uses torsemide PRN  . Fatty liver   . Gout     Past Surgical History  Procedure Date  . Abdominal hysterectomy FIBROIDS 1982  . Rhinoplasty   . Bladder suspension 1982  . Kidney stent 1995  . Cardioversion 02/05/2012    Procedure: CARDIOVERSION;  Surgeon: Pamella Pert, MD;  [facility] OR;  Service: Cardiovascular;  Laterality: N/A;  . Cholecystectomy 09/03/2012    Procedure: LAPAROSCOPIC CHOLECYSTECTOMY;  Surgeon: Dalia Heading, MD;  Location: [facility] ORS;  Service: General;  Laterality: N/A;  . Colonoscopy 2005    Dr. Katrinka Blazing: sigmoid diverticulosis  . Colonoscopy June 2011    Dr. Darrick Penna: internal hemorrhoids, simple adenomas, hyperplastic polyps, needs surveillance in June 2014 with overtube and Propofol    Prior to Admission medications   Medication Sig Start Date End Date Taking? Authorizing Provider  albuterol (PROVENTIL,VENTOLIN) 90 MCG/ACT inhaler Inhale 2 puffs into the lungs every 6 (six) hours as needed. For shortness of breath   Yes Historical Provider, MD  apixaban (ELIQUIS) 5 MG TABS tablet Take 5 mg by mouth 2 (two) times daily.   Yes Historical Provider, MD  ciprofloxacin (CIPRO) 500 MG tablet Take 500 mg by mouth 2 (two) times daily.   Yes Historical Provider, MD  colchicine 0.6 MG tablet Take 0.6 mg by mouth daily as needed. Gout Attack   Yes Historical Provider,  MD  digoxin (LANOXIN) 0.25 MG tablet Take 250 mcg by mouth daily.   Yes Historical Provider, MD  esomeprazole (NEXIUM) 40 MG capsule Take 40 mg by mouth daily before breakfast.     Yes Historical Provider, MD  FLUoxetine (PROZAC) 20 MG capsule Take 60 mg by mouth daily.     Yes Historical Provider, MD  fluticasone (FLONASE) 50 MCG/ACT nasal spray Place 2 sprays into the nose daily as needed. For allergies   Yes Historical Provider, MD  levothyroxine (SYNTHROID, LEVOTHROID) 50 MCG tablet Take 50 mcg by mouth daily.    Yes Historical  Provider, MD  LORazepam (ATIVAN) 1 MG tablet Take 1.5 mg by mouth 3 (three) times daily.   Yes Historical Provider, MD  miconazole (MONISTAT 7) 2 % vaginal cream Place 1 applicator vaginally at bedtime as needed. Yeast infection   Yes Historical Provider, MD  montelukast (SINGULAIR) 10 MG tablet Take 10 mg by mouth at bedtime.   Yes Historical Provider, MD  oxyCODONE-acetaminophen (PERCOCET) 7.5-325 MG per tablet Take 1 tablet by mouth every 4 (four) hours as needed for pain. 09/04/12 09/04/13 Yes Dalia Heading, MD  potassium chloride SA (K-DUR,KLOR-CON) 20 MEQ tablet Take 40 mEq by mouth daily as needed. Take with torsemide   Yes Historical Provider, MD  propranolol (INNOPRAN XL) 80 MG 24 hr capsule Take 80 mg by mouth every morning.    Yes Historical Provider, MD  torsemide (DEMADEX) 20 MG tablet Take 40 mg by mouth daily as needed. swelling   Yes Historical Provider, MD    Current Facility-Administered Medications  Medication Dose Route Frequency Provider Last Rate Last Dose  . [COMPLETED] 0.9 %  sodium chloride infusion   Intravenous Once Benny Lennert, MD 75 mL/hr at 09/30/12 0726 1,000 mL at 09/30/12 0726  . dextrose 5 % and 0.45 % NaCl with KCl 20 mEq/L infusion   Intravenous Continuous Dalia Heading, MD      . digoxin Margit Banda) tablet 250 mcg  250 mcg Oral Daily Dalia Heading, MD   250 mcg at 09/30/12 1423  . FLUoxetine (PROZAC) capsule 60 mg  60 mg Oral Daily Dalia Heading, MD      . [COMPLETED] gadobenate dimeglumine (MULTIHANCE) injection 20 mL  20 mL Intravenous Once PRN Medication Radiologist, MD   20 mL at 09/30/12 1314  . [COMPLETED] HYDROmorphone (DILAUDID) injection 1 mg  1 mg Intravenous Once Benny Lennert, MD   1 mg at 09/30/12 0728  . [COMPLETED] iohexol (OMNIPAQUE) 300 MG/ML solution 100 mL  100 mL Intravenous Once PRN Medication Radiologist, MD   100 mL at 09/30/12 1000  . levothyroxine (SYNTHROID, LEVOTHROID) tablet 50 mcg  50 mcg Oral QAC breakfast Dalia Heading,  MD      . morphine 2 MG/ML injection 1 mg  1 mg Intravenous Q2H PRN Dalia Heading, MD   1 mg at 09/30/12 1423  . morphine 2 MG/ML injection           . [COMPLETED] ondansetron (ZOFRAN) injection 4 mg  4 mg Intravenous Once Benny Lennert, MD   4 mg at 09/30/12 1610    Allergies as of 09/30/2012 - Review Complete 09/30/2012  Allergen Reaction Noted  . Atorvastatin Other (See Comments) 11/30/2006  . Demerol (meperidine) Other (See Comments) 09/30/2012  . Meperidine hcl Other (See Comments) 11/30/2006  . Zocor (simvastatin - high dose) Other (See Comments) 01/25/2012    Family History  Problem Relation Age of Onset  .  Colon cancer Mother 69  . Diabetes Mother   . Cerebral aneurysm Father     History   Social History  . Marital Status: Married    Spouse Name: N/A    Number of Children: N/A  . Years of Education: N/A   Occupational History  . Not on file.   Social History Main Topics  . Smoking status: Former Games developer  . Smokeless tobacco: Never Used     Comment: 22 years ago  . Alcohol Use: No  . Drug Use: No  . Sexually Active: No   Other Topics Concern  . Not on file   Social History Narrative  . No narrative on file    Review of Systems: Gen: Denies fever, +chills, loss of appetite, change in weight or weight loss CV: Denies chest pain, +heart palpitations, syncope, edema  Resp: Denies shortness of breath with rest, +DOE, no cough, wheezing GI: Denies dysphagia or odynophagia. Denies vomiting blood, jaundice, and fecal incontinence.  GU : +incontinence intermittently MS: + gout flares Derm: Denies rash, itching, dry skin Psych: + depression, on Prozac and Ativan Heme: Denies bruising, bleeding, and enlarged lymph nodes.  Physical Exam: Vital signs in last 24 hours: Temp:  [98.3 F (36.8 C)] 98.3 F (36.8 C) (11/05 0645) Pulse Rate:  [64-102] 94  (11/05 1403) Resp:  [16-18] 18  (11/05 1403) BP: (97-129)/(45-60) 129/60 mmHg (11/05 1403) SpO2:  [94 %-98  %] 94 % (11/05 1403) Weight:  [280 lb (127.007 kg)] 280 lb (127.007 kg) (11/05 0645)   General:   Alert,  Well-developed, well-nourished, pleasant and cooperative in NAD, Obese.  Head:  Normocephalic and atraumatic. Eyes:  Sclera clear, no icterus.   Conjunctiva pink. Ears:  Normal auditory acuity. Nose:  No deformity, discharge,  or lesions. Mouth:  No deformity or lesions, dentition normal. Neck:  Supple; no masses or thyromegaly. Lungs:  Clear throughout to auscultation.   No wheezes, crackles, or rhonchi. No acute distress. Heart:  Irregular irregular; no murmurs, clicks, rubs,  or gallops. Abdomen:  Soft, TTP RUQ, epigastric region, and nondistended. Difficult to appreciate HSM due to larger body habitus  Rectal:  Deferred  Msk:  Symmetrical without gross deformities. Normal posture. Pulses:  Normal pulses noted. Extremities:  Without clubbing or edema. Left bottom 1/3 of anterior leg with mild erythema, warm to touch, no obvious edema Neurologic:  Alert and  oriented x4;  grossly normal neurologically. Skin:  Intact without significant lesions or rashes. Cervical Nodes:  No significant cervical adenopathy. Psych:  Alert and cooperative. Normal mood and affect.  Intake/Output from previous day:   Intake/Output this shift:    Lab Results:  [facility] 09/30/12 0719  WBC 5.4  HGB 12.3  HCT 39.3  PLT 255   BMET  Basename 09/30/12 0719  NA 140  K 4.0  CL 101  CO2 28  GLUCOSE 147*  BUN 10  CREATININE 0.67  CALCIUM 9.2   LFT  Basename 09/30/12 1100 09/30/12 0719  PROT -- 7.4  ALBUMIN -- 3.3*  AST -- 416*  ALT -- 259*  ALKPHOS -- 161*  BILITOT -- 1.2  BILIDIR 0.7* --  IBILI -- --   PT/INR  Basename 09/30/12 1325  LABPROT 14.7  INR 1.17    Studies/Results: Ct Abdomen Pelvis W Contrast  09/30/2012  *RADIOLOGY REPORT*  Clinical Data: Abdominal pain, history of colon polyps, history of cholecystitis fatty liver, status post hysterectomy.  CT ABDOMEN AND  PELVIS WITH CONTRAST  Technique:  Multidetector  CT imaging of the abdomen and pelvis was performed following the standard protocol during bolus administration of intravenous contrast.  Contrast: OMNIPAQUE IOHEXOL 300 MG/ML  SOLN  Comparison: Abdominal MRI 02/14/2011  Findings: Lung bases are unremarkable.  Sagittal images of the spine shows mild degenerative changes lumbar spine.  Again noted fatty infiltration of the liver.  No intrahepatic biliary ductal dilatation. Surgical clips are noted in gallbladder region.  There is a 2.8 cm collection with simple fluid in the gallbladder bed region probable postsurgical in nature, seroma or biloma.  No peripheral enhancement to suggest abscess.  The pancreas, spleen and adrenal glands are unremarkable.  Kidneys are symmetrical in size and enhancement.  No focal renal mass.  No hydronephrosis or hydroureter.  Delayed renal images shows bilateral renal symmetrical excretion. Bilateral visualized proximal ureter is unremarkable. Atherosclerotic calcifications are noted abdominal aorta and the iliac arteries.  No aortic aneurysm.  No small bowel obstruction.  No ascites or free air.  No adenopathy.  In axial image 33 there is mild thickening of pyloric wall.  There is no evidence of gastric outlet obstruction. Inflammation or ulcer disease cannot be excluded.  Clinical correlation is necessary.  No pericecal inflammation.  Normal appendix is partially visualized axial image 53.  Multiple sigmoid colon diverticula are noted without evidence of acute diverticulitis.  The patient is status post hysterectomy.  Pelvic phleboliths are noted.  The urinary bladder is unremarkable.  Stool noted rectosigmoid colon.  IMPRESSION:  1.Surgical clips are noted in gallbladder region.  There is a 2.8 cm collection with simple fluid in the gallbladder bed region probable postsurgical in nature, seroma or biloma.  No peripheral enhancement to suggest abscess. 2.  Fatty infiltration of the  liver is noted. 3.  No hydronephrosis or hydroureter. 4.  Nonspecific mild thickening of pyloric wall without evidence of gastric outlet obstruction.  Inflammation at this level or ulcer disease cannot be excluded.  Clinical correlation is necessary. 5.  No pericecal inflammation.  Normal appendix.  6.  Status post hysterectomy.   Original Report Authenticated By: Natasha Mead, M.D.    Mr 3d Recon At Scanner  09/30/2012  *RADIOLOGY REPORT*  Clinical Data:  Abdominal pain.  Recent gallbladder removal.  MRI ABDOMEN WITHOUT AND WITH CONTRAST (INCLUDING MRCP)  Technique:  Multiplanar multisequence MR imaging of the abdomen was performed both before and after the administration of intravenous contrast. Heavily T2-weighted images of the biliary and pancreatic ducts were obtained, and three-dimensional MRCP images were rendered by post processing.  Contrast: 20mL MULTIHANCE GADOBENATE DIMEGLUMINE 529 MG/ML IV SOLN  Comparison:  09/30/2012  Findings:  Normal heart size.  No pericardial or pleural effusion. Lung bases appear clear.  There is diffuse fatty infiltration of the liver.  No focal liver abnormality.  Surgical clips are noted within the gallbladder fossa.  There is a small fluid collection adjacent to the clips measuring 1.7 x 2.8 cm. There is no biliary dilatation the common bile duct measures 6.9 mm.  Within the distal common bile duct there is a signal void which measures 4.9 mm and is suspicious for a small stone.  The pancreas appears normal. Several old granulomas identified within the splenic parenchyma.  The adrenal glands are both within normal limits.  Tiny T2 hyperintensities within both kidneys are noted which likely represent cysts.  No upper abdominal adenopathy identified.  IMPRESSION:  1.  Small stone is identified within the distal common bile duct measuring 4.9 mm. 2.  Previous cholecystectomy.  3.  Small fluid collection within the gallbladder fossa is likely postsurgical in nature, either small  seroma or myeloma. 4.  Fatty infiltration of the liver   Original Report Authenticated By: Signa Kell, M.D.    Mr Abd W/wo Cm/mrcp  09/30/2012  *RADIOLOGY REPORT*  Clinical Data:  Abdominal pain.  Recent gallbladder removal.  MRI ABDOMEN WITHOUT AND WITH CONTRAST (INCLUDING MRCP)  Technique:  Multiplanar multisequence MR imaging of the abdomen was performed both before and after the administration of intravenous contrast. Heavily T2-weighted images of the biliary and pancreatic ducts were obtained, and three-dimensional MRCP images were rendered by post processing.  Contrast: 20mL MULTIHANCE GADOBENATE DIMEGLUMINE 529 MG/ML IV SOLN  Comparison:  09/30/2012  Findings:  Normal heart size.  No pericardial or pleural effusion. Lung bases appear clear.  There is diffuse fatty infiltration of the liver.  No focal liver abnormality.  Surgical clips are noted within the gallbladder fossa.  There is a small fluid collection adjacent to the clips measuring 1.7 x 2.8 cm. There is no biliary dilatation the common bile duct measures 6.9 mm.  Within the distal common bile duct there is a signal void which measures 4.9 mm and is suspicious for a small stone.  The pancreas appears normal. Several old granulomas identified within the splenic parenchyma.  The adrenal glands are both within normal limits.  Tiny T2 hyperintensities within both kidneys are noted which likely represent cysts.  No upper abdominal adenopathy identified.  IMPRESSION:  1.  Small stone is identified within the distal common bile duct measuring 4.9 mm. 2.  Previous cholecystectomy. 3.  Small fluid collection within the gallbladder fossa is likely postsurgical in nature, either small seroma or myeloma. 4.  Fatty infiltration of the liver   Original Report Authenticated By: Signa Kell, M.D.     Impression: 71 year old female with choledocholithiasis, s/p lap cholecystectomy almost one month ago. Nonspecific mild thickening of pyloric wall without  evidence of gastric outlet obstruction. Likely due to inflammation; unable to exclude ulcer disease. No prior EGD documented. Presentation complicated by hx of afib requiring anticoagulation; previously on Pradaxa but recently changed to Eliquis. Last dose yesterday evening. Discussed with pharmacy possibility of heparin drip for bridging purposes. At this point, half-life noted as 15 hours, which would mean some of the medication will still be in her system for around 75 hours (per pharmacy's calculation with half-life). Heparin would be unnecessary for this procedure; literature notes holding Eliquis for at least 24 hours if low bleeding risk procedure and at least 48 if moderate to high bleeding risk procedure. As last dose was yesterday evening, her medication will have been held a little more than the required 24 hours. At this point, ERCP with sphincterotomy and stone extraction is necessary in the near future to prevent further complications. Pt is aware that she is at an above average bleeding risk. I discussed in detail the risks and benefits. Daughter and husband at bedside. Mild thickening of pyloric wall likely benign; evaluation at time of ERCP could be undertaken.   As a separate note, mild erythema noted to anterior lower 1/3 of of left shin; unclear etiology, no edema noted. Denies pain. Will reassess in am. ?early evolving cellulitis? Pt has hx of gout, but this does not appear to fall into this category.   Plan: ERCP with sphincterotomy and stone extraction on 11/6 with Dr. Jena Gauss, after 12 pm Continue to hold Eliquis Per pharmacy, may introduce Eliquis again if hemodynamically stable after  procedure May have sips of clears up until tomorrow morning at 8am, THEN NPO for procedure LFTs in am    LOS: 0 days   [name]  09/30/2012, 3:07 PM  As above; will be close to 48 hours off Eliquis by the time ERCP performed which should be satisfactory.

## 2012-09-30 NOTE — ED Notes (Signed)
Pt is currently in CT at this time. 

## 2012-10-01 ENCOUNTER — Observation Stay (HOSPITAL_COMMUNITY): Payer: Medicare Other | Admitting: Anesthesiology

## 2012-10-01 ENCOUNTER — Observation Stay (HOSPITAL_COMMUNITY): Payer: Medicare Other

## 2012-10-01 ENCOUNTER — Encounter (HOSPITAL_COMMUNITY): Payer: Self-pay | Admitting: *Deleted

## 2012-10-01 ENCOUNTER — Encounter (HOSPITAL_COMMUNITY): Payer: Self-pay | Admitting: Anesthesiology

## 2012-10-01 ENCOUNTER — Encounter (HOSPITAL_COMMUNITY)
Admission: EM | Disposition: A | Discharge status: Home / Self Care | Payer: Self-pay | Source: Home / Self Care | Attending: General Surgery

## 2012-10-01 DIAGNOSIS — R109 Unspecified abdominal pain: Secondary | ICD-10-CM | POA: Diagnosis not present

## 2012-10-01 DIAGNOSIS — K8050 Calculus of bile duct without cholangitis or cholecystitis without obstruction: Secondary | ICD-10-CM

## 2012-10-01 HISTORY — PX: SPHINCTEROTOMY: SHX5544

## 2012-10-01 HISTORY — PX: ERCP: SHX5425

## 2012-10-01 HISTORY — PX: REMOVAL OF STONES: SHX5545

## 2012-10-01 LAB — HEPATIC FUNCTION PANEL
ALT: 231 U/L — ABNORMAL HIGH (ref 0–35)
Albumin: 2.9 g/dL — ABNORMAL LOW (ref 3.5–5.2)
Alkaline Phosphatase: 141 U/L — ABNORMAL HIGH (ref 39–117)
Total Bilirubin: 0.5 mg/dL (ref 0.3–1.2)
Total Protein: 6.4 g/dL (ref 6.0–8.3)

## 2012-10-01 LAB — BASIC METABOLIC PANEL
BUN: 5 mg/dL — ABNORMAL LOW (ref 6–23)
Creatinine, Ser: 0.61 mg/dL (ref 0.50–1.10)
GFR calc Af Amer: >90 mL/min (ref >90)
GFR calc non Af Amer: 90 mL/min — ABNORMAL LOW (ref >90)
Glucose, Bld: 120 mg/dL — ABNORMAL HIGH (ref 70–99)
Potassium: 4 mEq/L (ref 3.5–5.1)

## 2012-10-01 LAB — CBC
HCT: 37.7 % (ref 36.0–46.0)
Hemoglobin: 11.5 g/dL — ABNORMAL LOW (ref 12.0–15.0)
MCH: 27.3 pg (ref 26.0–34.0)
MCHC: 30.5 g/dL (ref 30.0–36.0)
RDW: 14 % (ref 11.5–15.5)

## 2012-10-01 LAB — PROTIME-INR: Prothrombin Time: 15.4 seconds — ABNORMAL HIGH (ref 11.6–15.2)

## 2012-10-01 SURGERY — SPHINCTEROTOMY
Anesthesia: General

## 2012-10-01 MED ORDER — EPHEDRINE SULFATE 50 MG/ML IJ SOLN
INTRAMUSCULAR | Status: DC | PRN
  Administered 2012-10-01: 5 mg via INTRAVENOUS
  Administered 2012-10-01: 10 mg via INTRAVENOUS

## 2012-10-01 MED ORDER — MENTHOL 3 MG MT LOZG
1.0000 | LOZENGE | OROMUCOSAL | Status: DC | PRN
  Filled 2012-10-01: qty 9

## 2012-10-01 MED ORDER — FENTANYL CITRATE 0.05 MG/ML IJ SOLN
INTRAMUSCULAR | Status: AC
  Filled 2012-10-01: qty 2

## 2012-10-01 MED ORDER — PROPOFOL 10 MG/ML IV EMUL
INTRAVENOUS | Status: DC | PRN
  Administered 2012-10-01: 120 mg via INTRAVENOUS

## 2012-10-01 MED ORDER — LACTATED RINGERS IV SOLN
INTRAVENOUS | Status: DC | PRN
  Administered 2012-10-01: 10:00:00 via INTRAVENOUS

## 2012-10-01 MED ORDER — FENTANYL CITRATE 0.05 MG/ML IJ SOLN
25.0000 ug | INTRAMUSCULAR | Status: DC | PRN
  Administered 2012-10-01 (×2): 50 ug via INTRAVENOUS

## 2012-10-01 MED ORDER — MIDAZOLAM HCL 2 MG/2ML IJ SOLN
1.0000 mg | INTRAMUSCULAR | Status: DC | PRN
  Administered 2012-10-01: 2 mg via INTRAVENOUS

## 2012-10-01 MED ORDER — LIDOCAINE HCL (CARDIAC) 10 MG/ML IV SOLN
INTRAVENOUS | Status: DC | PRN
  Administered 2012-10-01 (×2): 50 mg via INTRAVENOUS

## 2012-10-01 MED ORDER — ONDANSETRON HCL 4 MG/2ML IJ SOLN
INTRAMUSCULAR | Status: AC
  Filled 2012-10-01: qty 2

## 2012-10-01 MED ORDER — GLYCOPYRROLATE 0.2 MG/ML IJ SOLN
INTRAMUSCULAR | Status: AC
  Filled 2012-10-01: qty 1

## 2012-10-01 MED ORDER — ROCURONIUM BROMIDE 50 MG/5ML IV SOLN
INTRAVENOUS | Status: AC
  Filled 2012-10-01: qty 1

## 2012-10-01 MED ORDER — LIDOCAINE HCL (PF) 1 % IJ SOLN
INTRAMUSCULAR | Status: AC
  Filled 2012-10-01: qty 5

## 2012-10-01 MED ORDER — GLYCOPYRROLATE 0.2 MG/ML IJ SOLN
0.2000 mg | Freq: Once | INTRAMUSCULAR | Status: AC
  Administered 2012-10-01: 0.2 mg via INTRAVENOUS

## 2012-10-01 MED ORDER — LACTATED RINGERS IV SOLN
INTRAVENOUS | Status: DC
  Administered 2012-10-01: 1000 mL via INTRAVENOUS

## 2012-10-01 MED ORDER — GLYCOPYRROLATE 0.2 MG/ML IJ SOLN
INTRAMUSCULAR | Status: AC
  Filled 2012-10-01: qty 3

## 2012-10-01 MED ORDER — FENTANYL CITRATE 0.05 MG/ML IJ SOLN
INTRAMUSCULAR | Status: DC | PRN
  Administered 2012-10-01: 100 ug via INTRAVENOUS

## 2012-10-01 MED ORDER — STERILE WATER FOR IRRIGATION IR SOLN
Status: DC | PRN
  Administered 2012-10-01: 2000 mL

## 2012-10-01 MED ORDER — ROCURONIUM BROMIDE 100 MG/10ML IV SOLN
INTRAVENOUS | Status: DC | PRN
  Administered 2012-10-01: 10 mg via INTRAVENOUS

## 2012-10-01 MED ORDER — ARTIFICIAL TEARS OP OINT
TOPICAL_OINTMENT | OPHTHALMIC | Status: AC
  Filled 2012-10-01: qty 3.5

## 2012-10-01 MED ORDER — SODIUM CHLORIDE 0.9 % IV SOLN: INTRAVENOUS | Status: DC

## 2012-10-01 MED ORDER — ONDANSETRON HCL 4 MG/2ML IJ SOLN: 4.0000 mg | Freq: Once | INTRAMUSCULAR | Status: DC

## 2012-10-01 MED ORDER — ONDANSETRON HCL 4 MG/2ML IJ SOLN: 4.0000 mg | Freq: Once | INTRAMUSCULAR | Status: DC | PRN

## 2012-10-01 MED ORDER — EPHEDRINE SULFATE 50 MG/ML IJ SOLN
INTRAMUSCULAR | Status: AC
  Filled 2012-10-01: qty 1

## 2012-10-01 MED ORDER — MIDAZOLAM HCL 2 MG/2ML IJ SOLN
INTRAMUSCULAR | Status: AC
  Filled 2012-10-01: qty 2

## 2012-10-01 MED ORDER — SUCCINYLCHOLINE CHLORIDE 20 MG/ML IJ SOLN
INTRAMUSCULAR | Status: AC
  Filled 2012-10-01: qty 1

## 2012-10-01 MED ORDER — SUCCINYLCHOLINE CHLORIDE 20 MG/ML IJ SOLN
INTRAMUSCULAR | Status: DC | PRN
  Administered 2012-10-01: 120 mg via INTRAVENOUS

## 2012-10-01 MED ORDER — NEOSTIGMINE METHYLSULFATE 1 MG/ML IJ SOLN
INTRAMUSCULAR | Status: DC | PRN
  Administered 2012-10-01: 4 mg via INTRAVENOUS

## 2012-10-01 MED ORDER — PROPOFOL 10 MG/ML IV EMUL
INTRAVENOUS | Status: AC
  Filled 2012-10-01: qty 20

## 2012-10-01 MED ORDER — GLYCOPYRROLATE 0.2 MG/ML IJ SOLN
INTRAMUSCULAR | Status: DC | PRN
  Administered 2012-10-01: 0.2 mg via INTRAVENOUS
  Administered 2012-10-01: 0.6 mg via INTRAVENOUS

## 2012-10-01 MED ORDER — STERILE WATER FOR IRRIGATION IR SOLN
Status: DC | PRN
  Administered 2012-10-01: 12:00:00

## 2012-10-01 SURGICAL SUPPLY — 16 items
BALLN RETRIEVAL 12X15 (BALLOONS) ×1 IMPLANT
BALN RTRVL 200 6-7FR 12-15 (BALLOONS) ×1
BASKET TRAPEZOID LITHO 2.5X5 (MISCELLANEOUS) ×1 IMPLANT
BLOCK BITE 60FR ADLT L/F BLUE (MISCELLANEOUS) ×1 IMPLANT
DEVICE LOCKING W-BIOPSY CAP (MISCELLANEOUS) ×1 IMPLANT
FLOOR PAD 36X40 (MISCELLANEOUS) ×2
NDL HYPO 18GX1.5 BLUNT FILL (NEEDLE) IMPLANT
NEEDLE HYPO 18GX1.5 BLUNT FILL (NEEDLE) ×2 IMPLANT
PAD FLOOR 36X40 (MISCELLANEOUS) IMPLANT
SPHINCTEROTOME HYDRATOME 44 (MISCELLANEOUS) ×1 IMPLANT
SPONGE GAUZE 4X4 12PLY (GAUZE/BANDAGES/DRESSINGS) ×1 IMPLANT
SYR 20CC LL (SYRINGE) ×1 IMPLANT
SYR 50ML LL SCALE MARK (SYRINGE) ×2 IMPLANT
SYSTEM CONTINUOUS INJECTION (MISCELLANEOUS) ×1 IMPLANT
TUBING ENDO SMARTCAP PENTAX (MISCELLANEOUS) ×1 IMPLANT
WATER STERILE IRR 1000ML POUR (IV SOLUTION) ×2 IMPLANT

## 2012-10-01 NOTE — Progress Notes (Signed)
Lovenox for VTE px ordered to bridge post-procedure while patient off Eliquis by Dr Lovell Sheehan.  Discussed Eliquis plan peri-procedure with GI yesterday.  Called Dr Lovell Sheehan to confirm plan still to resume Eliquis tomorrow as described in Dr Luvenia Starch note. He agreed. No need to bridge with Lovenox.  Junita Push, PharmD, BCPS

## 2012-10-01 NOTE — Progress Notes (Signed)
UR Chart Review Completed  

## 2012-10-01 NOTE — Preoperative (Signed)
Beta Blockers   Reason not to administer Beta Blockers:Not Applicable 

## 2012-10-01 NOTE — Transfer of Care (Signed)
  Anesthesia Post-op Note  Patient: [name]  Procedure(s) Performed: Procedure(s) (LRB) with comments: ENDOSCOPIC RETROGRADE CHOLANGIOPANCREATOGRAPHY (ERCP) WITH PROPOFOL (N/A) SPHINCTEROTOMY (N/A) REMOVAL OF STONES (N/A)  Patient Location: PACU  Anesthesia Type: General  Level of Consciousness: awake, alert , oriented and patient cooperative  Airway and Oxygen Therapy: Patient Spontanous Breathing and Patient connected to face mask oxygen  Post-op Pain: mild  Post-op Assessment: Post-op Vital signs reviewed, Patient's Cardiovascular Status Stable, Respiratory Function Stable, Patent Airway and No signs of Nausea or vomiting  Post-op Vital Signs: Reviewed and stable  Complications: No apparent anesthesia complications

## 2012-10-01 NOTE — Progress Notes (Signed)
Nutrition Brief Note  Patient identified on the Malnutrition Screening Tool (MST) Report  Body mass index is 51.73 kg/(m^2). Pt meets criteria for Obesity Class III based on current BMI.   Current diet order is Clear Liquids and being advanced as tolerated, patient is s/p endoscopic retrograde cholangiopancreatography with sphincterotomy and balloon stone extraction. Labs and medications reviewed. Pt potential d/c within next 24 h per MD note.  No nutrition interventions warranted at this time. If nutrition issues arise, please consult RD.   #[phone]

## 2012-10-01 NOTE — Anesthesia Postprocedure Evaluation (Addendum)
  Anesthesia Post-op Note  Patient: [facility]  Procedure(s) Performed: Procedure(s) (LRB) with comments: ENDOSCOPIC RETROGRADE CHOLANGIOPANCREATOGRAPHY (ERCP) WITH PROPOFOL (N/A) SPHINCTEROTOMY (N/A) REMOVAL OF STONES (N/A)  Patient Location: PACU  Anesthesia Type: General  Level of Consciousness: awake, alert , oriented and patient cooperative  Airway and Oxygen Therapy: Patient Spontanous Breathing and Patient connected to face mask oxygen  Post-op Pain: mild  Post-op Assessment: Post-op Vital signs reviewed, Patient's Cardiovascular Status Stable, Respiratory Function Stable, Patent Airway and No signs of Nausea or vomiting  Post-op Vital Signs: Reviewed and stable  Complications: No apparent anesthesia complications   10/02/2012  Patient up and doing well.  VSS.  Denies PONV, recall.  No apparent anesthesia complications.

## 2012-10-01 NOTE — Progress Notes (Signed)
Patient seen in preop today. ERCP with sphincterotomy and stone extraction planned.The risks, benefits, limitations, alternatives, and imponderable have been reviewed with the patient. I specifically discussed a1 in 10 chance of pancreatitis, reaction to medications, bleeding, perforation and the possibility of a failed ERCP. Potential for sphincterotomy and stent placement also reviewed. Questions have been answered. All parties agreeable.

## 2012-10-01 NOTE — Anesthesia Preprocedure Evaluation (Addendum)
Anesthesia Evaluation  Patient identified by MRN, date of birth, ID band Patient awake    Reviewed: Allergy & Precautions, H&P , NPO status , Patient's Chart, lab work & pertinent test results, reviewed documented beta blocker date and time   History of Anesthesia Complications Negative for: history of anesthetic complications  Airway Mallampati: I      Dental  (+) Edentulous Upper   Pulmonary shortness of breath, asthma , sleep apnea and Continuous Positive Airway Pressure Ventilation ,    + decreased breath sounds      Cardiovascular +CHF DOE: DCCV on 01/2012. + dysrhythmias Atrial Fibrillation Rhythm:Irregular Rate:Normal     Neuro/Psych  Headaches, PSYCHIATRIC DISORDERS Anxiety Depression Hx narcolepsy  Neuromuscular disease    GI/Hepatic GERD-  Medicated,  Endo/Other  Hypothyroidism Morbid obesity  Renal/GU      Musculoskeletal  (+) Fibromyalgia -  Abdominal (+) + obese,   Peds  Hematology   Anesthesia Other Findings   Reproductive/Obstetrics                          Anesthesia Physical Anesthesia Plan  ASA: III  Anesthesia Plan: General   Post-op Pain Management:    Induction: Intravenous, Rapid sequence and Cricoid pressure planned  Airway Management Planned: Oral ETT  Additional Equipment:   Intra-op Plan:   Post-operative Plan: Extubation in OR  Informed Consent: I have reviewed the patients History and Physical, chart, labs and discussed the procedure including the risks, benefits and alternatives for the proposed anesthesia with the patient or authorized representative who has indicated his/her understanding and acceptance.     Plan Discussed with:   Anesthesia Plan Comments:         Anesthesia Quick Evaluation

## 2012-10-01 NOTE — Progress Notes (Signed)
Subjective: Still feels like there is a tight band around her upper abdomen. Pain is about the same. No nausea. No fevers or chills.  Objective: Vital signs in last 24 hours: Temp:  [97.5 F (36.4 C)-97.7 F (36.5 C)] 97.6 F (36.4 C) (11/06 0619) Pulse Rate:  [64-102] 64  (11/06 0619) Resp:  [16-20] 20  (11/06 0619) BP: (92-129)/(45-70) 112/68 mmHg (11/06 0619) SpO2:  [94 %-99 %] 99 % (11/06 0619) Weight:  [128.3 kg (282 lb 13.6 oz)] 128.3 kg (282 lb 13.6 oz) (11/05 1521) Last BM Date: 09/29/12  Intake/Output from previous day: 11/05 0701 - 11/06 0700 In: 360 [P.O.:360] Out: 700 [Urine:700] Intake/Output this shift:    General appearance: alert and no distress GI: Positive bowel sounds, soft, mild right upper quadrant abdominal tenderness. No diffuse peritoneal signs.  Lab Results:   [facility] 10/01/12 0505 09/30/12 0719  WBC 5.9 5.4  HGB 11.5* 12.3  HCT 37.7 39.3  PLT 233 255   BMET  Basename 10/01/12 0505 09/30/12 0719  NA 141 140  K 4.0 4.0  CL 105 101  CO2 30 28  GLUCOSE 120* 147*  BUN 5* 10  CREATININE 0.61 0.67  CALCIUM 8.6 9.2   PT/INR  Basename 10/01/12 0505 09/30/12 1325  LABPROT 15.4* 14.7  INR 1.24 1.17   ABG No results found for this basename: PHART:2,PCO2:2,PO2:2,HCO3:2 in the last 72 hours  Studies/Results: Ct Abdomen Pelvis W Contrast  09/30/2012  *RADIOLOGY REPORT*  Clinical Data: Abdominal pain, history of colon polyps, history of cholecystitis fatty liver, status post hysterectomy.  CT ABDOMEN AND PELVIS WITH CONTRAST  Technique:  Multidetector CT imaging of the abdomen and pelvis was performed following the standard protocol during bolus administration of intravenous contrast.  Contrast: OMNIPAQUE IOHEXOL 300 MG/ML  SOLN  Comparison: Abdominal MRI 02/14/2011  Findings: Lung bases are unremarkable.  Sagittal images of the spine shows mild degenerative changes lumbar spine.  Again noted fatty infiltration of the liver.  No  intrahepatic biliary ductal dilatation. Surgical clips are noted in gallbladder region.  There is a 2.8 cm collection with simple fluid in the gallbladder bed region probable postsurgical in nature, seroma or biloma.  No peripheral enhancement to suggest abscess.  The pancreas, spleen and adrenal glands are unremarkable.  Kidneys are symmetrical in size and enhancement.  No focal renal mass.  No hydronephrosis or hydroureter.  Delayed renal images shows bilateral renal symmetrical excretion. Bilateral visualized proximal ureter is unremarkable. Atherosclerotic calcifications are noted abdominal aorta and the iliac arteries.  No aortic aneurysm.  No small bowel obstruction.  No ascites or free air.  No adenopathy.  In axial image 33 there is mild thickening of pyloric wall.  There is no evidence of gastric outlet obstruction. Inflammation or ulcer disease cannot be excluded.  Clinical correlation is necessary.  No pericecal inflammation.  Normal appendix is partially visualized axial image 53.  Multiple sigmoid colon diverticula are noted without evidence of acute diverticulitis.  The patient is status post hysterectomy.  Pelvic phleboliths are noted.  The urinary bladder is unremarkable.  Stool noted rectosigmoid colon.  IMPRESSION:  1.Surgical clips are noted in gallbladder region.  There is a 2.8 cm collection with simple fluid in the gallbladder bed region probable postsurgical in nature, seroma or biloma.  No peripheral enhancement to suggest abscess. 2.  Fatty infiltration of the liver is noted. 3.  No hydronephrosis or hydroureter. 4.  Nonspecific mild thickening of pyloric wall without evidence of gastric outlet obstruction.  Inflammation at this level or ulcer disease cannot be excluded.  Clinical correlation is necessary. 5.  No pericecal inflammation.  Normal appendix.  6.  Status post hysterectomy.   Original Report Authenticated By: Natasha Mead, M.D.    Mr 3d Recon At Scanner  09/30/2012  *RADIOLOGY  REPORT*  Clinical Data:  Abdominal pain.  Recent gallbladder removal.  MRI ABDOMEN WITHOUT AND WITH CONTRAST (INCLUDING MRCP)  Technique:  Multiplanar multisequence MR imaging of the abdomen was performed both before and after the administration of intravenous contrast. Heavily T2-weighted images of the biliary and pancreatic ducts were obtained, and three-dimensional MRCP images were rendered by post processing.  Contrast: 20mL MULTIHANCE GADOBENATE DIMEGLUMINE 529 MG/ML IV SOLN  Comparison:  09/30/2012  Findings:  Normal heart size.  No pericardial or pleural effusion. Lung bases appear clear.  There is diffuse fatty infiltration of the liver.  No focal liver abnormality.  Surgical clips are noted within the gallbladder fossa.  There is a small fluid collection adjacent to the clips measuring 1.7 x 2.8 cm. There is no biliary dilatation the common bile duct measures 6.9 mm.  Within the distal common bile duct there is a signal void which measures 4.9 mm and is suspicious for a small stone.  The pancreas appears normal. Several old granulomas identified within the splenic parenchyma.  The adrenal glands are both within normal limits.  Tiny T2 hyperintensities within both kidneys are noted which likely represent cysts.  No upper abdominal adenopathy identified.  IMPRESSION:  1.  Small stone is identified within the distal common bile duct measuring 4.9 mm. 2.  Previous cholecystectomy. 3.  Small fluid collection within the gallbladder fossa is likely postsurgical in nature, either small seroma or myeloma. 4.  Fatty infiltration of the liver   Original Report Authenticated By: Signa Kell, M.D.    Mr Abd W/wo Cm/mrcp  09/30/2012  *RADIOLOGY REPORT*  Clinical Data:  Abdominal pain.  Recent gallbladder removal.  MRI ABDOMEN WITHOUT AND WITH CONTRAST (INCLUDING MRCP)  Technique:  Multiplanar multisequence MR imaging of the abdomen was performed both before and after the administration of intravenous contrast.  Heavily T2-weighted images of the biliary and pancreatic ducts were obtained, and three-dimensional MRCP images were rendered by post processing.  Contrast: 20mL MULTIHANCE GADOBENATE DIMEGLUMINE 529 MG/ML IV SOLN  Comparison:  09/30/2012  Findings:  Normal heart size.  No pericardial or pleural effusion. Lung bases appear clear.  There is diffuse fatty infiltration of the liver.  No focal liver abnormality.  Surgical clips are noted within the gallbladder fossa.  There is a small fluid collection adjacent to the clips measuring 1.7 x 2.8 cm. There is no biliary dilatation the common bile duct measures 6.9 mm.  Within the distal common bile duct there is a signal void which measures 4.9 mm and is suspicious for a small stone.  The pancreas appears normal. Several old granulomas identified within the splenic parenchyma.  The adrenal glands are both within normal limits.  Tiny T2 hyperintensities within both kidneys are noted which likely represent cysts.  No upper abdominal adenopathy identified.  IMPRESSION:  1.  Small stone is identified within the distal common bile duct measuring 4.9 mm. 2.  Previous cholecystectomy. 3.  Small fluid collection within the gallbladder fossa is likely postsurgical in nature, either small seroma or myeloma. 4.  Fatty infiltration of the liver   Original Report Authenticated By: Signa Kell, M.D.     Anti-infectives: Anti-infectives    None  Assessment/Plan: s/p Procedure(s) (LRB) with comments: ENDOSCOPIC RETROGRADE CHOLANGIOPANCREATOGRAPHY (ERCP) WITH PROPOFOL (N/A) Retained common bile duct stone. Patient is scheduled for ERCP today. Overall patient is stable. The patient will be able to be advanced on her diet following the ERCP. Pending results of ERCP possible discharge in the next 24-48 hours pending patient's progression.  LOS: 1 day    Ethyl Vila C 10/01/2012

## 2012-10-01 NOTE — Anesthesia Procedure Notes (Signed)
Procedure Name: Intubation Date/Time: 10/01/2012 11:11 AM Performed by: Carolyne Littles, AMY L Pre-anesthesia Checklist: Patient identified, Patient being monitored, Timeout performed, Emergency Drugs available and Suction available Patient Re-evaluated:Patient Re-evaluated prior to inductionOxygen Delivery Method: Circle System Utilized Preoxygenation: Pre-oxygenation with 100% oxygen Intubation Type: IV induction Ventilation: Mask ventilation without difficulty Laryngoscope Size: 3 and Miller Grade View: Grade I Tube type: Oral Tube size: 7.0 mm Number of attempts: 1 Airway Equipment and Method: stylet Placement Confirmation: ETT inserted through vocal cords under direct vision,  positive ETCO2 and breath sounds checked- equal and bilateral Secured at: 21 cm Tube secured with: Tape Dental Injury: Teeth and Oropharynx as per pre-operative assessment

## 2012-10-01 NOTE — Op Note (Signed)
NAMEHARRIETTA, INCORVAIA                   ACCOUNT NO.:  [id number]  MEDICAL RECORD NO.:  [id number]  LOCATION:  A319                          FACILITY:  [facility]  PHYSICIAN:  R. Roetta Sessions, MD FACP FACGDATE OF BIRTH:  [birth year]  DATE OF PROCEDURE: DATE OF DISCHARGE:                              OPERATIVE REPORT   PROCEDURE:  Endoscopic retrograde cholangiopancreatography with sphincterotomy and balloon stone extraction.  INDICATIONS FOR PROCEDURE:  A 71 year old lady, status post laparoscopic cholecystectomy several days ago, presented with recurrent biliary type pain.  LFTs elevated.  MRCP positive for distal common bile duct stone. Her anticoagulation has been stopped.  ERCP now being done to extract stones.  Risks, benefits, limitations, alternatives, and imponderables have been discussed.  The patient and patient's husband talked about the risk of pancreatitis, bleeding, potential for failed procedure.  All questions were answered.  All parties agreeable.  PROCEDURE NOTE:  The patient was taken to the OR, where she was placed under general endotracheal anesthesia by Dr. Marcos Eke and associates. She was placed in semiprone position.  INSTRUMENTATION:  Pentax video chip duodenum scope.  FINDINGS:  Cursory examination of the distal esophagus, stomach, and duodenum through the second portion revealed no abnormalities.  The scope was pulled back to the short position, 55 cm from the incisors. The ampulla of Vater was readily identified on the medial wall of the second portion of the duodenum.  Utilizing the [facility] sphincter tone, the ampulla was approached with guidewire palpation, almost immediately acquired a deep biliary cannulation.  Sphincter tone followed up.  Cholangiogram was obtained.  The common bile duct and common hepatic duct appeared to be somewhat dilated at 9-10 mm with smooth tapering distally.  Intrahepatic appeared grossly normal.  The cystic duct was not  opacified.  Initially, I did not see a filling defect on the cholangiogram.  Given MRCP findings from yesterday, I kept safety wire deep in the biliary tree, and pulled the sphincter tone back across the ampullary orifice at 12 o'clock position.  Utilizing the Erbe unit, I performed a 1 cm sphincterotomy without bleeding or other apparent complication.  Subsequently, I railed the sphincter tone off and railed the graduated extractor balloon to the confluence and inflated it to accommodate the size of the bile duct and performed a balloon occlusion cholangiogram.  With this maneuver, I have recovered a 9 mm egg-shaped cholesterol stone through the ampullary orifice (see photos).  I repeated this maneuver the second time without recovery of any further stone material.  Finally, I passed trapezoid basket through the distal common bile duct, with no further stone material recovered. There appeared to be excellent flow of bile with this maneuver. Multiple air bubbles were introduced in the biliary tree.  The pancreatic duct was not injected or manipulated.  The patient tolerated the procedure well and was taken to PACU in stable condition.  IMPRESSION: 1. Choledocholithiasis. 2. Status post biliary sphincterotomy. 3. Balloon stone extraction performed as described above.  RECOMMENDATIONS: 1. Clear liquids for lunch, then advance as tolerated. 2. Hepatic profile tomorrow morning. 3. Anticipate discharge within the next 24 hours. 4. I would recommend resumption of Eliquis tomorrow  morning.     Jonathon Bellows, MD FACP [facility]     RMR/MEDQ  D:  10/01/2012  T:  10/01/2012  Job:  [id number]  cc:   Annia Friendly. Loleta Chance, MD Fax: [phone]  Dalia Heading, M.D. Fax: [phone]

## 2012-10-02 DIAGNOSIS — J45909 Unspecified asthma, uncomplicated: Secondary | ICD-10-CM | POA: Diagnosis present

## 2012-10-02 DIAGNOSIS — K219 Gastro-esophageal reflux disease without esophagitis: Secondary | ICD-10-CM | POA: Diagnosis present

## 2012-10-02 DIAGNOSIS — G43909 Migraine, unspecified, not intractable, without status migrainosus: Secondary | ICD-10-CM | POA: Diagnosis present

## 2012-10-02 DIAGNOSIS — K8050 Calculus of bile duct without cholangitis or cholecystitis without obstruction: Secondary | ICD-10-CM | POA: Diagnosis not present

## 2012-10-02 DIAGNOSIS — F411 Generalized anxiety disorder: Secondary | ICD-10-CM | POA: Diagnosis present

## 2012-10-02 DIAGNOSIS — E785 Hyperlipidemia, unspecified: Secondary | ICD-10-CM | POA: Diagnosis present

## 2012-10-02 DIAGNOSIS — M109 Gout, unspecified: Secondary | ICD-10-CM | POA: Diagnosis present

## 2012-10-02 DIAGNOSIS — G4733 Obstructive sleep apnea (adult) (pediatric): Secondary | ICD-10-CM | POA: Diagnosis present

## 2012-10-02 DIAGNOSIS — K7689 Other specified diseases of liver: Secondary | ICD-10-CM | POA: Diagnosis present

## 2012-10-02 DIAGNOSIS — IMO0001 Reserved for inherently not codable concepts without codable children: Secondary | ICD-10-CM | POA: Diagnosis present

## 2012-10-02 DIAGNOSIS — M1990 Unspecified osteoarthritis, unspecified site: Secondary | ICD-10-CM | POA: Diagnosis present

## 2012-10-02 DIAGNOSIS — I4891 Unspecified atrial fibrillation: Secondary | ICD-10-CM | POA: Diagnosis present

## 2012-10-02 DIAGNOSIS — R1011 Right upper quadrant pain: Secondary | ICD-10-CM | POA: Diagnosis not present

## 2012-10-02 DIAGNOSIS — Z79899 Other long term (current) drug therapy: Secondary | ICD-10-CM | POA: Diagnosis not present

## 2012-10-02 DIAGNOSIS — Z87891 Personal history of nicotine dependence: Secondary | ICD-10-CM | POA: Diagnosis not present

## 2012-10-02 DIAGNOSIS — E039 Hypothyroidism, unspecified: Secondary | ICD-10-CM | POA: Diagnosis present

## 2012-10-02 DIAGNOSIS — F3289 Other specified depressive episodes: Secondary | ICD-10-CM | POA: Diagnosis present

## 2012-10-02 DIAGNOSIS — R7989 Other specified abnormal findings of blood chemistry: Secondary | ICD-10-CM | POA: Diagnosis not present

## 2012-10-02 DIAGNOSIS — K8051 Calculus of bile duct without cholangitis or cholecystitis with obstruction: Secondary | ICD-10-CM | POA: Diagnosis present

## 2012-10-02 DIAGNOSIS — Z6841 Body Mass Index (BMI) 40.0 and over, adult: Secondary | ICD-10-CM | POA: Diagnosis not present

## 2012-10-02 LAB — HEPATIC FUNCTION PANEL
AST: 166 U/L — ABNORMAL HIGH (ref 0–37)
Albumin: 2.9 g/dL — ABNORMAL LOW (ref 3.5–5.2)
Total Bilirubin: 0.5 mg/dL (ref 0.3–1.2)
Total Protein: 6.3 g/dL (ref 6.0–8.3)

## 2012-10-02 LAB — LIPASE, BLOOD: Lipase: 20 U/L (ref 11–59)

## 2012-10-02 MED ORDER — DIGOXIN 250 MCG PO TABS
250.0000 ug | ORAL_TABLET | Freq: Every day | ORAL | Status: DC
  Administered 2012-10-02: 250 ug via ORAL
  Filled 2012-10-02: qty 1

## 2012-10-02 MED ORDER — APIXABAN 5 MG PO TABS
5.0000 mg | ORAL_TABLET | Freq: Two times a day (BID) | ORAL | Status: DC
  Administered 2012-10-02 – 2012-10-03 (×3): 5 mg via ORAL
  Filled 2012-10-02 (×3): qty 1

## 2012-10-02 NOTE — Addendum Note (Signed)
Addendum  created 10/02/12 1517 by Moshe Salisbury, CRNA   Modules edited:Notes Section

## 2012-10-02 NOTE — Progress Notes (Signed)
UR Chart Review Completed  

## 2012-10-02 NOTE — Progress Notes (Signed)
1 Day Post-Op  Subjective: Having some upper abdominal pain across the upper abdomen. She did have some gas pain yesterday. She denies any nausea, vomiting, or melena. She was coughing up some clear fluid. She states she has had some nasal drainage.  Objective: Vital signs in last 24 hours: Temp:  [97.9 F (36.6 C)-98 F (36.7 C)] 98 F (36.7 C) (11/07 0453) Pulse Rate:  [57-76] 67  (11/07 0453) Resp:  [12-25] 20  (11/07 0453) BP: (102-159)/(45-81) 133/73 mmHg (11/07 0453) SpO2:  [90 %-100 %] 99 % (11/07 0453) Last BM Date: 09/29/12  Intake/Output from previous day: 11/06 0701 - 11/07 0700 In: 2543 [P.O.:230; I.V.:2313] Out: -  Intake/Output this shift:    General appearance: alert and cooperative Resp: clear to auscultation bilaterally GI: Soft. No rigidity noted. Nonspecific upper abdominal pain to deep palpation.  Lab Results:   [facility] 10/01/12 0505 09/30/12 0719  WBC 5.9 5.4  HGB 11.5* 12.3  HCT 37.7 39.3  PLT 233 255   BMET  Basename 10/01/12 0505 09/30/12 0719  NA 141 140  K 4.0 4.0  CL 105 101  CO2 30 28  GLUCOSE 120* 147*  BUN 5* 10  CREATININE 0.61 0.67  CALCIUM 8.6 9.2   PT/INR  Basename 10/01/12 0505 09/30/12 1325  LABPROT 15.4* 14.7  INR 1.24 1.17    Studies/Results: Ct Abdomen Pelvis W Contrast  09/30/2012  *RADIOLOGY REPORT*  Clinical Data: Abdominal pain, history of colon polyps, history of cholecystitis fatty liver, status post hysterectomy.  CT ABDOMEN AND PELVIS WITH CONTRAST  Technique:  Multidetector CT imaging of the abdomen and pelvis was performed following the standard protocol during bolus administration of intravenous contrast.  Contrast: OMNIPAQUE IOHEXOL 300 MG/ML  SOLN  Comparison: Abdominal MRI 02/14/2011  Findings: Lung bases are unremarkable.  Sagittal images of the spine shows mild degenerative changes lumbar spine.  Again noted fatty infiltration of the liver.  No intrahepatic biliary ductal dilatation. Surgical clips  are noted in gallbladder region.  There is a 2.8 cm collection with simple fluid in the gallbladder bed region probable postsurgical in nature, seroma or biloma.  No peripheral enhancement to suggest abscess.  The pancreas, spleen and adrenal glands are unremarkable.  Kidneys are symmetrical in size and enhancement.  No focal renal mass.  No hydronephrosis or hydroureter.  Delayed renal images shows bilateral renal symmetrical excretion. Bilateral visualized proximal ureter is unremarkable. Atherosclerotic calcifications are noted abdominal aorta and the iliac arteries.  No aortic aneurysm.  No small bowel obstruction.  No ascites or free air.  No adenopathy.  In axial image 33 there is mild thickening of pyloric wall.  There is no evidence of gastric outlet obstruction. Inflammation or ulcer disease cannot be excluded.  Clinical correlation is necessary.  No pericecal inflammation.  Normal appendix is partially visualized axial image 53.  Multiple sigmoid colon diverticula are noted without evidence of acute diverticulitis.  The patient is status post hysterectomy.  Pelvic phleboliths are noted.  The urinary bladder is unremarkable.  Stool noted rectosigmoid colon.  IMPRESSION:  1.Surgical clips are noted in gallbladder region.  There is a 2.8 cm collection with simple fluid in the gallbladder bed region probable postsurgical in nature, seroma or biloma.  No peripheral enhancement to suggest abscess. 2.  Fatty infiltration of the liver is noted. 3.  No hydronephrosis or hydroureter. 4.  Nonspecific mild thickening of pyloric wall without evidence of gastric outlet obstruction.  Inflammation at this level or ulcer disease  cannot be excluded.  Clinical correlation is necessary. 5.  No pericecal inflammation.  Normal appendix.  6.  Status post hysterectomy.   Original Report Authenticated By: Natasha Mead, M.D.    Mr 3d Recon At Scanner  09/30/2012  *RADIOLOGY REPORT*  Clinical Data:  Abdominal pain.  Recent  gallbladder removal.  MRI ABDOMEN WITHOUT AND WITH CONTRAST (INCLUDING MRCP)  Technique:  Multiplanar multisequence MR imaging of the abdomen was performed both before and after the administration of intravenous contrast. Heavily T2-weighted images of the biliary and pancreatic ducts were obtained, and three-dimensional MRCP images were rendered by post processing.  Contrast: 20mL MULTIHANCE GADOBENATE DIMEGLUMINE 529 MG/ML IV SOLN  Comparison:  09/30/2012  Findings:  Normal heart size.  No pericardial or pleural effusion. Lung bases appear clear.  There is diffuse fatty infiltration of the liver.  No focal liver abnormality.  Surgical clips are noted within the gallbladder fossa.  There is a small fluid collection adjacent to the clips measuring 1.7 x 2.8 cm. There is no biliary dilatation the common bile duct measures 6.9 mm.  Within the distal common bile duct there is a signal void which measures 4.9 mm and is suspicious for a small stone.  The pancreas appears normal. Several old granulomas identified within the splenic parenchyma.  The adrenal glands are both within normal limits.  Tiny T2 hyperintensities within both kidneys are noted which likely represent cysts.  No upper abdominal adenopathy identified.  IMPRESSION:  1.  Small stone is identified within the distal common bile duct measuring 4.9 mm. 2.  Previous cholecystectomy. 3.  Small fluid collection within the gallbladder fossa is likely postsurgical in nature, either small seroma or myeloma. 4.  Fatty infiltration of the liver   Original Report Authenticated By: Signa Kell, M.D.    Dg Ercp With Sphincterotomy  10/01/2012  *RADIOLOGY REPORT*  Clinical Data: Choledocolithiasis  ERCP WITH SPHINCTEROTOMY  Comparison:  MRCP 09/30/2012  Technique:  Multiple spot images obtained with the fluoroscopic device and submitted for interpretation post-procedure.  ERCP was performed by Dr. Jena Gauss.  Findings: Dilated CBD with smooth walls. Several small filling  defects are seen within the distal CBD which may represent air bubbles and/or small stones. Multiple balloon passages were performed. Stone [employer] utilized. No definite CBD stricture seen. Visualized intrahepatic biliary radicles normal appearance.  IMPRESSION: Dilated CBD showing a small filling defects in the distal CBD which could represent a combination of air bubbles or stones.  These images were submitted for radiologic interpretation only. Please see the procedural report for the amount of contrast and the fluoroscopy time utilized.   Original Report Authenticated By: Ulyses Southward, M.D.    Mr Abd W/wo Cm/mrcp  09/30/2012  *RADIOLOGY REPORT*  Clinical Data:  Abdominal pain.  Recent gallbladder removal.  MRI ABDOMEN WITHOUT AND WITH CONTRAST (INCLUDING MRCP)  Technique:  Multiplanar multisequence MR imaging of the abdomen was performed both before and after the administration of intravenous contrast. Heavily T2-weighted images of the biliary and pancreatic ducts were obtained, and three-dimensional MRCP images were rendered by post processing.  Contrast: 20mL MULTIHANCE GADOBENATE DIMEGLUMINE 529 MG/ML IV SOLN  Comparison:  09/30/2012  Findings:  Normal heart size.  No pericardial or pleural effusion. Lung bases appear clear.  There is diffuse fatty infiltration of the liver.  No focal liver abnormality.  Surgical clips are noted within the gallbladder fossa.  There is a small fluid collection adjacent to the clips measuring 1.7 x 2.8 cm. There is no  biliary dilatation the common bile duct measures 6.9 mm.  Within the distal common bile duct there is a signal void which measures 4.9 mm and is suspicious for a small stone.  The pancreas appears normal. Several old granulomas identified within the splenic parenchyma.  The adrenal glands are both within normal limits.  Tiny T2 hyperintensities within both kidneys are noted which likely represent cysts.  No upper abdominal adenopathy identified.  IMPRESSION:  1.   Small stone is identified within the distal common bile duct measuring 4.9 mm. 2.  Previous cholecystectomy. 3.  Small fluid collection within the gallbladder fossa is likely postsurgical in nature, either small seroma or myeloma. 4.  Fatty infiltration of the liver   Original Report Authenticated By: Signa Kell, M.D.     Anti-infectives: Anti-infectives    None      Assessment/Plan: s/p Procedure(s): SPHINCTEROTOMY REMOVAL OF STONES ENDOSCOPIC RETROGRADE CHOLANGIOPANCREATOGRAPHY (ERCP) Impression: Status post ERCP with sphincterotomy for stone extraction. Liver enzyme tests are improving. Total bilirubin is normal. She does have some upper abdominal pain, which I think is secondary to the procedure. I do not think she has had a complication. A lipase will be checked. Will advance her to a low-fat diet. Will check labs in a.m. Agree with restarting anticoagulation today.  LOS: 2 days    Ranveer Wahlstrom A 10/02/2012

## 2012-10-02 NOTE — Progress Notes (Signed)
Subjective:  Patient feels better but complains of upper abdominal soreness. C/O coughing. Does not like clear liquids. Normal light brown BM today. Dr. Lovell Sheehan at bedside, discussed case with him as well.   Objective: Vital signs in last 24 hours: Temp:  [97.9 F (36.6 C)-98 F (36.7 C)] 98 F (36.7 C) (11/07 0453) Pulse Rate:  [57-76] 67  (11/07 0453) Resp:  [12-25] 20  (11/07 0453) BP: (102-159)/(45-81) 133/73 mmHg (11/07 0453) SpO2:  [90 %-100 %] 99 % (11/07 0453) Last BM Date: 09/29/12 General:   Alert,  Well-developed, well-nourished, pleasant and cooperative in NAD. Husband at bedside. Head:  Normocephalic and atraumatic. Eyes:  Sclera clear, no icterus.   Abdomen:  Soft, mild upper abdominal tenderness and nondistended. Normal bowel sounds, without guarding, and without rebound.   Extremities:  Without clubbing, deformity or edema. Neurologic:  Alert and  oriented x4;  grossly normal neurologically. Skin:  Intact without significant lesions or rashes. Psych:  Alert and cooperative. Normal mood and affect.  Intake/Output from previous day: 11/06 0701 - 11/07 0700 In: 2543 [P.O.:230; I.V.:2313] Out: -  Intake/Output this shift:    Lab Results: CBC  Basename 10/01/12 0505 09/30/12 0719  WBC 5.9 5.4  HGB 11.5* 12.3  HCT 37.7 39.3  MCV 89.3 87.9  PLT 233 255   BMET  Basename 10/01/12 0505 09/30/12 0719  NA 141 140  K 4.0 4.0  CL 105 101  CO2 30 28  GLUCOSE 120* 147*  BUN 5* 10  CREATININE 0.61 0.67  CALCIUM 8.6 9.2   LFTs  Basename 10/02/12 0519 10/01/12 0505 09/30/12 1714  BILITOT 0.5 0.5 0.9  BILIDIR 0.2 0.2 0.5*  IBILI 0.3 0.3 0.4  ALKPHOS 148* 141* 152*  AST 166* 219* 372*  ALT 214* 231* 284*  PROT 6.3 6.4 6.6  ALBUMIN 2.9* 2.9* 3.0*    Basename 09/30/12 0719  LIPASE 57   PT/INR  Basename 10/01/12 0505 09/30/12 1325  LABPROT 15.4* 14.7  INR 1.24 1.17      Imaging Studies: Ct Abdomen Pelvis W Contrast  09/30/2012  *RADIOLOGY REPORT*   Clinical Data: Abdominal pain, history of colon polyps, history of cholecystitis fatty liver, status post hysterectomy.  CT ABDOMEN AND PELVIS WITH CONTRAST  Technique:  Multidetector CT imaging of the abdomen and pelvis was performed following the standard protocol during bolus administration of intravenous contrast.  Contrast: OMNIPAQUE IOHEXOL 300 MG/ML  SOLN  Comparison: Abdominal MRI 02/14/2011  Findings: Lung bases are unremarkable.  Sagittal images of the spine shows mild degenerative changes lumbar spine.  Again noted fatty infiltration of the liver.  No intrahepatic biliary ductal dilatation. Surgical clips are noted in gallbladder region.  There is a 2.8 cm collection with simple fluid in the gallbladder bed region probable postsurgical in nature, seroma or biloma.  No peripheral enhancement to suggest abscess.  The pancreas, spleen and adrenal glands are unremarkable.  Kidneys are symmetrical in size and enhancement.  No focal renal mass.  No hydronephrosis or hydroureter.  Delayed renal images shows bilateral renal symmetrical excretion. Bilateral visualized proximal ureter is unremarkable. Atherosclerotic calcifications are noted abdominal aorta and the iliac arteries.  No aortic aneurysm.  No small bowel obstruction.  No ascites or free air.  No adenopathy.  In axial image 33 there is mild thickening of pyloric wall.  There is no evidence of gastric outlet obstruction. Inflammation or ulcer disease cannot be excluded.  Clinical correlation is necessary.  No pericecal inflammation.  Normal appendix  is partially visualized axial image 53.  Multiple sigmoid colon diverticula are noted without evidence of acute diverticulitis.  The patient is status post hysterectomy.  Pelvic phleboliths are noted.  The urinary bladder is unremarkable.  Stool noted rectosigmoid colon.  IMPRESSION:  1.Surgical clips are noted in gallbladder region.  There is a 2.8 cm collection with simple fluid in the gallbladder bed  region probable postsurgical in nature, seroma or biloma.  No peripheral enhancement to suggest abscess. 2.  Fatty infiltration of the liver is noted. 3.  No hydronephrosis or hydroureter. 4.  Nonspecific mild thickening of pyloric wall without evidence of gastric outlet obstruction.  Inflammation at this level or ulcer disease cannot be excluded.  Clinical correlation is necessary. 5.  No pericecal inflammation.  Normal appendix.  6.  Status post hysterectomy.   Original Report Authenticated By: Natasha Mead, M.D.    Mr 3d Recon At Scanner  09/30/2012  *RADIOLOGY REPORT*  Clinical Data:  Abdominal pain.  Recent gallbladder removal.  MRI ABDOMEN WITHOUT AND WITH CONTRAST (INCLUDING MRCP)  Technique:  Multiplanar multisequence MR imaging of the abdomen was performed both before and after the administration of intravenous contrast. Heavily T2-weighted images of the biliary and pancreatic ducts were obtained, and three-dimensional MRCP images were rendered by post processing.  Contrast: 20mL MULTIHANCE GADOBENATE DIMEGLUMINE 529 MG/ML IV SOLN  Comparison:  09/30/2012  Findings:  Normal heart size.  No pericardial or pleural effusion. Lung bases appear clear.  There is diffuse fatty infiltration of the liver.  No focal liver abnormality.  Surgical clips are noted within the gallbladder fossa.  There is a small fluid collection adjacent to the clips measuring 1.7 x 2.8 cm. There is no biliary dilatation the common bile duct measures 6.9 mm.  Within the distal common bile duct there is a signal void which measures 4.9 mm and is suspicious for a small stone.  The pancreas appears normal. Several old granulomas identified within the splenic parenchyma.  The adrenal glands are both within normal limits.  Tiny T2 hyperintensities within both kidneys are noted which likely represent cysts.  No upper abdominal adenopathy identified.  IMPRESSION:  1.  Small stone is identified within the distal common bile duct measuring 4.9 mm.  2.  Previous cholecystectomy. 3.  Small fluid collection within the gallbladder fossa is likely postsurgical in nature, either small seroma or myeloma. 4.  Fatty infiltration of the liver   Original Report Authenticated By: Signa Kell, M.D.    Dg Ercp With Sphincterotomy  10/01/2012  *RADIOLOGY REPORT*  Clinical Data: Choledocolithiasis  ERCP WITH SPHINCTEROTOMY  Comparison:  MRCP 09/30/2012  Technique:  Multiple spot images obtained with the fluoroscopic device and submitted for interpretation post-procedure.  ERCP was performed by Dr. Jena Gauss.  Findings: Dilated CBD with smooth walls. Several small filling defects are seen within the distal CBD which may represent air bubbles and/or small stones. Multiple balloon passages were performed. Stone [employer] utilized. No definite CBD stricture seen. Visualized intrahepatic biliary radicles normal appearance.  IMPRESSION: Dilated CBD showing a small filling defects in the distal CBD which could represent a combination of air bubbles or stones.  These images were submitted for radiologic interpretation only. Please see the procedural report for the amount of contrast and the fluoroscopy time utilized.   Original Report Authenticated By: Ulyses Southward, M.D.     Assessment: 71 y/o female s/p lap chole one month ago, presenting with choledocholithiasis. ERCP with ES/stone extraction yesterday. LFTs improving. Clinically pain is  better. Upper abdominal soreness likely multifactorial related to ERCP and coughing episodes. Patient has not required narcotics since ERCP. No N/V. Wants to advance diet. Discussed with patient, husband, Dr. Lovell Sheehan. Plans on keeping inpatient until tomorrow. Management of cough per Dr. Lovell Sheehan.   Plan: 1. Resume Eliquis 5mg  BID per Dr. Jena Gauss. 2. Add Lipase today. 3. Recheck LFTs, CBC tomorrow. 4. Advance to low-fat diet. 5. Dr. Jena Gauss to reevaluate later today.   LOS: 2 days   Tana Coast  10/02/2012, 7:57 AM   Serum lipase 20; we'll  reassess in a.m.

## 2012-10-03 ENCOUNTER — Telehealth: Payer: Self-pay | Admitting: Gastroenterology

## 2012-10-03 ENCOUNTER — Encounter (HOSPITAL_COMMUNITY): Payer: Self-pay | Admitting: *Deleted

## 2012-10-03 LAB — CBC
HCT: 38.4 % (ref 36.0–46.0)
Hemoglobin: 11.7 g/dL — ABNORMAL LOW (ref 12.0–15.0)
MCV: 90.4 fL (ref 78.0–100.0)
RBC: 4.25 MIL/uL (ref 3.87–5.11)
RDW: 14.4 % (ref 11.5–15.5)
WBC: 6.1 10*3/uL (ref 4.0–10.5)

## 2012-10-03 LAB — BASIC METABOLIC PANEL
BUN: 9 mg/dL (ref 6–23)
CO2: 32 mEq/L (ref 19–32)
Chloride: 103 mEq/L (ref 96–112)
Creatinine, Ser: 0.71 mg/dL (ref 0.50–1.10)
GFR calc Af Amer: >90 mL/min (ref >90)
Glucose, Bld: 116 mg/dL — ABNORMAL HIGH (ref 70–99)
Potassium: 3.9 mEq/L (ref 3.5–5.1)

## 2012-10-03 LAB — HEPATIC FUNCTION PANEL
ALT: 150 U/L — ABNORMAL HIGH (ref 0–35)
AST: 71 U/L — ABNORMAL HIGH (ref 0–37)
Alkaline Phosphatase: 135 U/L — ABNORMAL HIGH (ref 39–117)
Bilirubin, Direct: 0.1 mg/dL (ref 0.0–0.3)
Indirect Bilirubin: 0.3 mg/dL (ref 0.3–0.9)

## 2012-10-03 NOTE — Progress Notes (Signed)
Patient on telemetry - monitor reading that patient is having pauses at times that last between 2.01-2.21 seconds, patient denies any pain or discomfort at this time, obtained vitals - BP 106/47, HR 62, 100% on 2L of O2, called and spoke with Dr. Lovell Sheehan in regards to pauses, MD made aware - no new orders at this time, will continue to monitor patient at this time

## 2012-10-03 NOTE — Telephone Encounter (Signed)
Patient needs to have LFTs done in 7-10 days. Please make arrangements. She is going home from hospital today.

## 2012-10-03 NOTE — Discharge Summary (Signed)
Physician Discharge Summary  Patient ID: HIEDI TOUCHTON MRN: [id number] DOB/AGE: August 05, 1941 71 y.o.  Admit date: 09/30/2012 Discharge date: 10/03/2012  Admission Diagnoses: Retained common bile duct stone  Discharge Diagnoses: Same Active Problems:  * No active hospital problems. *    Discharged Condition: good  Hospital Course: Patient is a 71 year old white female who underwent a laparoscopic cholecystectomy in October of 2013 who presented emergency room with worsening upper abdominal pain. Liver enzyme tests revealed significant elevation. Total bilirubin was elevated with a direct component which was elevated. CT scan of the abdomen revealed no significant biloma or hepatobiliary dilatation. MRCP revealed choledocholithiasis. The patient was admitted to the hospital and a GI consult was obtained. Dr. Jena Gauss performed an ERCP with sphincterotomy and stone extraction on 10/01/2012. The stone was extracted. On postoperative day one, she was restarted on her eliquis for chronic atrial fibrillation. She did have more upper abdominal pain than would be anticipated from her procedure. Thus, she was admitted to the hospital and observed. Her liver enzyme tests were decreasing. Her diet was advanced. The following day, she states that her abdominal pain has for the most part resolved. She is being discharged home on 10/03/2012 in good improving condition.  Consults: GI  Treatments: surgery: ERCP with sphincterotomy and stone extraction on 10/01/2012  Discharge Exam: Blood pressure 93/58, pulse 56, temperature 97.3 F (36.3 C), temperature source Oral, resp. rate 20, height 5\' 2"  (1.575 m), weight 128.3 kg (282 lb 13.6 oz), SpO2 94.00%. General appearance: alert, cooperative and no distress Resp: clear to auscultation bilaterally Cardio: irregularly irregular rhythm GI: soft, non-tender; bowel sounds normal; no masses,  no organomegaly  Disposition: 01-Home or Self Care     Medication List     As of 10/03/2012  7:36 AM    TAKE these medications         albuterol 90 MCG/ACT inhaler   Commonly known as: PROVENTIL,VENTOLIN   Inhale 2 puffs into the lungs every 6 (six) hours as needed. For shortness of breath      apixaban 5 MG Tabs tablet   Commonly known as: ELIQUIS   Take 5 mg by mouth 2 (two) times daily.      ciprofloxacin 500 MG tablet   Commonly known as: CIPRO   Take 500 mg by mouth 2 (two) times daily.      colchicine 0.6 MG tablet   Take 0.6 mg by mouth daily as needed. Gout Attack      digoxin 0.25 MG tablet   Commonly known as: LANOXIN   Take 250 mcg by mouth daily.      esomeprazole 40 MG capsule   Commonly known as: NEXIUM   Take 40 mg by mouth daily before breakfast.      FLUoxetine 20 MG capsule   Commonly known as: PROZAC   Take 60 mg by mouth daily.      fluticasone 50 MCG/ACT nasal spray   Commonly known as: FLONASE   Place 2 sprays into the nose daily as needed. For allergies      levothyroxine 50 MCG tablet   Commonly known as: SYNTHROID, LEVOTHROID   Take 50 mcg by mouth daily.      LORazepam 1 MG tablet   Commonly known as: ATIVAN   Take 1.5 mg by mouth 3 (three) times daily.      miconazole 2 % vaginal cream   Commonly known as: MONISTAT 7   Place 1 applicator vaginally at bedtime as needed. Yeast  infection      montelukast 10 MG tablet   Commonly known as: SINGULAIR   Take 10 mg by mouth at bedtime.      oxyCODONE-acetaminophen 7.5-325 MG per tablet   Commonly known as: PERCOCET   Take 1 tablet by mouth every 4 (four) hours as needed for pain.      potassium chloride SA 20 MEQ tablet   Commonly known as: K-DUR,KLOR-CON   Take 40 mEq by mouth daily as needed. Take with torsemide      propranolol 80 MG 24 hr capsule   Commonly known as: INNOPRAN XL   Take 80 mg by mouth every morning.      torsemide 20 MG tablet   Commonly known as: DEMADEX   Take 40 mg by mouth daily as needed. swelling      ASK your doctor about  these medications         dabigatran 150 MG Caps   Commonly known as: PRADAXA   Take 150 mg by mouth every 12 (twelve) hours.           Follow-up Information    Follow up with Dalia Heading, MD. (As needed)    Contact information:   [address] [facility] [address] [phone]       Follow up with Eula Listen, MD. (As needed)    Contact information:   [address] [place] [address] [phone]          Signed: Dalia Heading 10/03/2012, 7:36 AM

## 2012-10-03 NOTE — Progress Notes (Signed)
Pt and husband verbalize understanding of d/c instructions, and medications and indications for follow up. IV d/c. Belongings with husband, pt being d/c via wheelchair with her husband and [employer] fund[employer]. Sheryn Bison

## 2012-10-07 ENCOUNTER — Other Ambulatory Visit: Payer: Self-pay | Admitting: Gastroenterology

## 2012-10-07 ENCOUNTER — Other Ambulatory Visit: Payer: Self-pay

## 2012-10-07 DIAGNOSIS — R109 Unspecified abdominal pain: Secondary | ICD-10-CM

## 2012-10-07 NOTE — Telephone Encounter (Signed)
Letter and lab order done and mailed to pt.

## 2012-10-14 DIAGNOSIS — R109 Unspecified abdominal pain: Secondary | ICD-10-CM | POA: Diagnosis not present

## 2012-10-14 LAB — HEPATIC FUNCTION PANEL
ALT: 32 U/L (ref 0–35)
Albumin: 4 g/dL (ref 3.5–5.2)
Indirect Bilirubin: 0.4 mg/dL (ref 0.0–0.9)
Total Protein: 6.9 g/dL (ref 6.0–8.3)

## 2012-10-15 NOTE — Progress Notes (Signed)
Quick Note:  Please let pt know her LFTs are NORMAL! ______

## 2012-10-22 DIAGNOSIS — M545 Low back pain: Secondary | ICD-10-CM | POA: Diagnosis not present

## 2012-10-30 DIAGNOSIS — H905 Unspecified sensorineural hearing loss: Secondary | ICD-10-CM | POA: Diagnosis not present

## 2012-10-30 DIAGNOSIS — H9319 Tinnitus, unspecified ear: Secondary | ICD-10-CM | POA: Diagnosis not present

## 2012-10-30 DIAGNOSIS — J310 Chronic rhinitis: Secondary | ICD-10-CM | POA: Diagnosis not present

## 2012-11-07 ENCOUNTER — Other Ambulatory Visit (HOSPITAL_COMMUNITY): Payer: Self-pay | Admitting: Family Medicine

## 2012-11-07 ENCOUNTER — Ambulatory Visit (HOSPITAL_COMMUNITY)
Admission: RE | Admit: 2012-11-07 | Discharge: 2012-11-07 | Disposition: A | Discharge status: Home / Self Care | Payer: Medicare Other | Source: Ambulatory Visit | Attending: Family Medicine | Admitting: Family Medicine

## 2012-11-07 DIAGNOSIS — Z09 Encounter for follow-up examination after completed treatment for conditions other than malignant neoplasm: Secondary | ICD-10-CM

## 2012-11-07 DIAGNOSIS — M545 Low back pain, unspecified: Secondary | ICD-10-CM | POA: Insufficient documentation

## 2012-11-07 DIAGNOSIS — M5137 Other intervertebral disc degeneration, lumbosacral region: Secondary | ICD-10-CM | POA: Diagnosis not present

## 2012-12-02 ENCOUNTER — Ambulatory Visit (HOSPITAL_COMMUNITY)
Admission: RE | Admit: 2012-12-02 | Discharge: 2012-12-02 | Disposition: A | Discharge status: Home / Self Care | Payer: Medicare Other | Source: Ambulatory Visit | Attending: Family Medicine | Admitting: Family Medicine

## 2012-12-02 DIAGNOSIS — Z1231 Encounter for screening mammogram for malignant neoplasm of breast: Secondary | ICD-10-CM | POA: Diagnosis not present

## 2012-12-02 DIAGNOSIS — Z09 Encounter for follow-up examination after completed treatment for conditions other than malignant neoplasm: Secondary | ICD-10-CM

## 2012-12-03 DIAGNOSIS — E669 Obesity, unspecified: Secondary | ICD-10-CM | POA: Diagnosis not present

## 2012-12-03 DIAGNOSIS — E039 Hypothyroidism, unspecified: Secondary | ICD-10-CM | POA: Diagnosis not present

## 2012-12-03 DIAGNOSIS — G4733 Obstructive sleep apnea (adult) (pediatric): Secondary | ICD-10-CM | POA: Diagnosis not present

## 2012-12-03 DIAGNOSIS — E782 Mixed hyperlipidemia: Secondary | ICD-10-CM | POA: Diagnosis not present

## 2012-12-22 ENCOUNTER — Telehealth: Payer: Self-pay | Admitting: *Deleted

## 2012-12-22 NOTE — Telephone Encounter (Signed)
Routing to Doris, this is an [facility] pt. 

## 2012-12-22 NOTE — Telephone Encounter (Signed)
Per Durward Mallard, we will have to ask Dr. Jena Gauss if he will accept her since she is a [facility] patient. I called the patient back to explain that we will check with Dr. Jena Gauss and let her know what his decision is. She said forget it. I will find another doctor and hung up.

## 2012-12-22 NOTE — Telephone Encounter (Signed)
[name] called today. She is having gradually worse stomach pains. She made an appt to come in a few weeks, but would like to know if we can get her something for the pain sooner or if we can work her in sooner. Thank you.

## 2012-12-22 NOTE — Telephone Encounter (Signed)
Pt said she is NOT A [facility] PT. She does not want to see Dr. Darrick Penna. She said she is having the abdominal pain like she had before she had her gallbladder removed. It is very much constant. No N/V. Bm's are regularly, almost every day. Please advise!

## 2012-12-23 NOTE — Telephone Encounter (Signed)
REVIEWED.  Pt last seen by Dr. Valene Bors 2011. PT NO LONGER WISHES TO HAVE ME AS HER PRIMARY GI MD.

## 2012-12-23 NOTE — Telephone Encounter (Signed)
REVIEWED.  

## 2012-12-23 NOTE — Telephone Encounter (Signed)
I will assume the role as her primary gastroenterologist. It sounds like she is having some recurrent issues. Let's get her an expedited appointment with one of the extenders, initially.

## 2012-12-23 NOTE — Telephone Encounter (Signed)
Pt has OV for 2/13 at 0900 with KJ

## 2012-12-23 NOTE — Telephone Encounter (Signed)
Darl Pikes, will you please get pt appt with an extender.

## 2013-01-02 ENCOUNTER — Emergency Department (HOSPITAL_COMMUNITY)
Admission: EM | Admit: 2013-01-02 | Discharge: 2013-01-02 | Disposition: A | Discharge status: Home / Self Care | Payer: Medicare Other | Attending: Emergency Medicine | Admitting: Emergency Medicine

## 2013-01-02 ENCOUNTER — Encounter (HOSPITAL_COMMUNITY): Payer: Self-pay | Admitting: *Deleted

## 2013-01-02 ENCOUNTER — Emergency Department (HOSPITAL_COMMUNITY): Payer: Medicare Other

## 2013-01-02 DIAGNOSIS — R197 Diarrhea, unspecified: Secondary | ICD-10-CM | POA: Insufficient documentation

## 2013-01-02 DIAGNOSIS — R1013 Epigastric pain: Secondary | ICD-10-CM | POA: Diagnosis not present

## 2013-01-02 DIAGNOSIS — Z8679 Personal history of other diseases of the circulatory system: Secondary | ICD-10-CM | POA: Diagnosis not present

## 2013-01-02 DIAGNOSIS — Z87891 Personal history of nicotine dependence: Secondary | ICD-10-CM | POA: Diagnosis not present

## 2013-01-02 DIAGNOSIS — Z8639 Personal history of other endocrine, nutritional and metabolic disease: Secondary | ICD-10-CM | POA: Insufficient documentation

## 2013-01-02 DIAGNOSIS — Z860100 Personal history of colon polyps, unspecified: Secondary | ICD-10-CM | POA: Insufficient documentation

## 2013-01-02 DIAGNOSIS — F411 Generalized anxiety disorder: Secondary | ICD-10-CM | POA: Diagnosis not present

## 2013-01-02 DIAGNOSIS — G4733 Obstructive sleep apnea (adult) (pediatric): Secondary | ICD-10-CM | POA: Diagnosis not present

## 2013-01-02 DIAGNOSIS — Z9889 Other specified postprocedural states: Secondary | ICD-10-CM | POA: Diagnosis not present

## 2013-01-02 DIAGNOSIS — F329 Major depressive disorder, single episode, unspecified: Secondary | ICD-10-CM | POA: Insufficient documentation

## 2013-01-02 DIAGNOSIS — Z79899 Other long term (current) drug therapy: Secondary | ICD-10-CM | POA: Diagnosis not present

## 2013-01-02 DIAGNOSIS — F3289 Other specified depressive episodes: Secondary | ICD-10-CM | POA: Insufficient documentation

## 2013-01-02 DIAGNOSIS — Z8719 Personal history of other diseases of the digestive system: Secondary | ICD-10-CM | POA: Diagnosis not present

## 2013-01-02 DIAGNOSIS — Z8739 Personal history of other diseases of the musculoskeletal system and connective tissue: Secondary | ICD-10-CM | POA: Insufficient documentation

## 2013-01-02 DIAGNOSIS — Z862 Personal history of diseases of the blood and blood-forming organs and certain disorders involving the immune mechanism: Secondary | ICD-10-CM | POA: Diagnosis not present

## 2013-01-02 DIAGNOSIS — Z7901 Long term (current) use of anticoagulants: Secondary | ICD-10-CM | POA: Insufficient documentation

## 2013-01-02 DIAGNOSIS — J45909 Unspecified asthma, uncomplicated: Secondary | ICD-10-CM | POA: Insufficient documentation

## 2013-01-02 DIAGNOSIS — Z8669 Personal history of other diseases of the nervous system and sense organs: Secondary | ICD-10-CM | POA: Insufficient documentation

## 2013-01-02 DIAGNOSIS — R071 Chest pain on breathing: Secondary | ICD-10-CM | POA: Insufficient documentation

## 2013-01-02 DIAGNOSIS — R109 Unspecified abdominal pain: Secondary | ICD-10-CM | POA: Insufficient documentation

## 2013-01-02 DIAGNOSIS — Z9981 Dependence on supplemental oxygen: Secondary | ICD-10-CM | POA: Diagnosis not present

## 2013-01-02 DIAGNOSIS — R0789 Other chest pain: Secondary | ICD-10-CM

## 2013-01-02 DIAGNOSIS — K219 Gastro-esophageal reflux disease without esophagitis: Secondary | ICD-10-CM | POA: Insufficient documentation

## 2013-01-02 DIAGNOSIS — I4891 Unspecified atrial fibrillation: Secondary | ICD-10-CM | POA: Diagnosis not present

## 2013-01-02 LAB — CBC WITH DIFFERENTIAL/PLATELET
Basophils Relative: 0 % (ref 0–1)
Eosinophils Absolute: 0.4 10*3/uL (ref 0.0–0.7)
Hemoglobin: 12.2 g/dL (ref 12.0–15.0)
Lymphs Abs: 1.4 10*3/uL (ref 0.7–4.0)
MCH: 28.3 pg (ref 26.0–34.0)
MCHC: 31.9 g/dL (ref 30.0–36.0)
Neutro Abs: 5.1 10*3/uL (ref 1.7–7.7)
Neutrophils Relative %: 69 % (ref 43–77)
Platelets: 251 10*3/uL (ref 150–400)
RBC: 4.31 MIL/uL (ref 3.87–5.11)

## 2013-01-02 LAB — URINALYSIS, ROUTINE W REFLEX MICROSCOPIC
Bilirubin Urine: NEGATIVE
Glucose, UA: NEGATIVE mg/dL
Hgb urine dipstick: NEGATIVE
Ketones, ur: NEGATIVE mg/dL
pH: 7 (ref 5.0–8.0)

## 2013-01-02 LAB — COMPREHENSIVE METABOLIC PANEL
ALT: 23 U/L (ref 0–35)
Albumin: 3.6 g/dL (ref 3.5–5.2)
Alkaline Phosphatase: 91 U/L (ref 39–117)
Chloride: 99 mEq/L (ref 96–112)
Potassium: 3.6 mEq/L (ref 3.5–5.1)
Sodium: 138 mEq/L (ref 135–145)
Total Bilirubin: 0.4 mg/dL (ref 0.3–1.2)
Total Protein: 7 g/dL (ref 6.0–8.3)

## 2013-01-02 MED ORDER — TRAMADOL HCL 50 MG PO TABS: 50.0000 mg | ORAL_TABLET | Freq: Four times a day (QID) | ORAL | Status: DC | PRN

## 2013-01-02 NOTE — ED Provider Notes (Signed)
History   This chart was scribed for Flint Melter, MD by Toya Smothers, ED Scribe. The patient was seen in room APA05/APA05. Patient's care was started at 1454.  CSN: [id number]  Arrival date & time 01/02/13  1454   First MD Initiated Contact with Patient 01/02/13 1506      Chief Complaint  Patient presents with  . Flank Pain    HPI  [name] is a 72 y.o. female with h/o cholecystectomy and stone removal by Dr. Lovell Sheehan (10/01/2012), who presents to the Emergency Department complaining of 4 months of recurrent, waxing and waning, localized pain below the right breast. Pain is 2/10 without provication, sharp, worse with movement, and absent at night. Per Pt, pain has remained since surgery. There is mild temporary relief with use of Percocet. No fever, cough, nausea, vomiting, and dysuria. Pt is currently weaning off Rx Fluoxitine 1 tablet per day, and denoting discontinued medications as Torsemide, Potassium supplement, Colcrys (Colchicine), and Levothyroxine. Pt is a former smoker denying alcohol use.     Past Medical History  Diagnosis Date  . GERD (gastroesophageal reflux disease)   . Fibromyalgia   . Anxiety   . Depression   . Obesity, morbid (more than 100 lbs over ideal weight or BMI > 40)   . Hyperlipemia   . Hypothyroidism   . Obstructive sleep apnea on CPAP and O2  . Migraine   . Colon polyp     TCS 2005 TICS Luis M. Cintron, TCS/PROPOFOL 2011 SIMPLE ADENOMAS  . Allergic rhinitis   . Osteoarthritis   . Palpitations   . Narcolepsy   . Cholecystitis   . Afib     a. s/p DCCV 01/2012 but did not hold.  . Diverticulosis 2005  . Internal hemorrhoids     H/o Rectal bleeding secondary to internal hemorrhoids 04/2010.  . Volume overload     uses torsemide PRN  . Fatty liver   . Gout   . Asthma     pt states she "believe it is more allergies"    Past Surgical History  Procedure Date  . Abdominal hysterectomy FIBROIDS 1982  . Rhinoplasty   . Bladder suspension 1982  . Kidney  stent 1995  . Cardioversion 02/05/2012    Procedure: CARDIOVERSION;  Surgeon: Pamella Pert, MD;  Location: [facility] OR;  Service: Cardiovascular;  Laterality: N/A;  . Cholecystectomy 09/03/2012    Procedure: LAPAROSCOPIC CHOLECYSTECTOMY;  Surgeon: Dalia Heading, MD;  Location: [facility] ORS;  Service: General;  Laterality: N/A;  . Colonoscopy 2005    Dr. Katrinka Blazing: sigmoid diverticulosis  . Colonoscopy June 2011    Dr. Darrick Penna: internal hemorrhoids, simple adenomas, hyperplastic polyps, needs surveillance in June 2014 with overtube and Propofol  . Sphincterotomy 10/01/2012    Procedure: SPHINCTEROTOMY;  Surgeon: Corbin Ade, MD;  Location: [facility] ORS;  Service: Endoscopy;  Laterality: N/A;  . Removal of stones 10/01/2012    Procedure: REMOVAL OF STONES;  Surgeon: Corbin Ade, MD;  Location: [facility];  Service: Endoscopy;  Laterality: N/A;  . Ercp 10/01/2012    Procedure: ENDOSCOPIC RETROGRADE CHOLANGIOPANCREATOGRAPHY (ERCP);  Surgeon: Corbin Ade, MD;  Location: [facility];  Service: Endoscopy;  Laterality: N/A;    Family History  Problem Relation Age of Onset  . Colon cancer Mother 61  . Diabetes Mother   . Cerebral aneurysm Father     History  Substance Use Topics  . Smoking status: Former Smoker -- 0.5 packs/day for 30 years  Quit date: 10/03/1989  . Smokeless tobacco: Never Used     Comment: 22 years ago  . Alcohol Use: No    Review of Systems  Gastrointestinal: Positive for abdominal pain and diarrhea.  All other systems reviewed and are negative.    Allergies  Atorvastatin; Demerol; Meperidine hcl; and Zocor  Home Medications   Current Outpatient Rx  Name  Route  Sig  Dispense  Refill  . ALBUTEROL 90 MCG/ACT IN AERS   Inhalation   Inhale 2 puffs into the lungs every 6 (six) hours as needed. For shortness of breath         . APIXABAN 5 MG PO TABS   Oral   Take 5 mg by mouth 2 (two) times daily.         . CYANOCOBALAMIN 500 MCG PO TABS   Oral   Take 1,000 mcg by mouth  daily.         Marland Kitchen DIGOXIN 0.25 MG PO TABS   Oral   Take 250 mcg by mouth every evening.          Marland Kitchen DIMETHICONE 1 % EX CREA   Topical   Apply 1 application topically as needed. For itching/protection         . ESOMEPRAZOLE MAGNESIUM 40 MG PO CPDR   Oral   Take 40 mg by mouth every evening.          Marland Kitchen FLUOXETINE HCL 20 MG PO CAPS   Oral   Take 20 mg by mouth every morning.          Marland Kitchen HYDROCORTISONE 1 % EX CREA   Topical   Apply 1 application topically as needed. For itching         . LORAZEPAM 1 MG PO TABS   Oral   Take 1.5 mg by mouth 3 (three) times daily.         Marland Kitchen MICONAZOLE NITRATE 2 % VA CREA   Apply externally   Apply 1 applicator topically at bedtime as needed. Yeast infection         . MOMETASONE FUROATE 50 MCG/ACT NA SUSP   Nasal   Place 1 spray into the nose at bedtime.         . OXYCODONE-ACETAMINOPHEN 5-325 MG PO TABS   Oral   Take 1 tablet by mouth every 4 (four) hours as needed. For pain         . PROPRANOLOL HCL ER BEADS 80 MG PO CP24   Oral   Take 80 mg by mouth every morning.          Marland Kitchen COLCHICINE 0.6 MG PO TABS   Oral   Take 0.6 mg by mouth daily as needed. Gout Attack         . TRAMADOL HCL 50 MG PO TABS   Oral   Take 1 tablet (50 mg total) by mouth every 6 (six) hours as needed for pain.   30 tablet   0     BP 96/68  Pulse 76  Temp 98 F (36.7 C) (Oral)  Resp 20  Ht 5\' 2"  (1.575 m)  Wt 280 lb (127.007 kg)  BMI 51.21 kg/m2  SpO2 97%  Physical Exam  Nursing note and vitals reviewed. Constitutional: She is oriented to person, place, and time. She appears well-developed and well-nourished.  HENT:  Head: Normocephalic and atraumatic.  Right Ear: External ear normal.  Left Ear: External ear normal.       Mucus  membranes dry  Eyes: Conjunctivae normal and EOM are normal. Pupils are equal, round, and reactive to light.  Neck: Normal range of motion and phonation normal. Neck supple.  Cardiovascular: Normal  rate, regular rhythm, normal heart sounds and intact distal pulses.   Pulmonary/Chest: Effort normal and breath sounds normal. She exhibits no bony tenderness.       Localized right lower chest wall tenderness.  Abdominal: Soft. Normal appearance. There is tenderness.       Right upper abdominal tenderness. Mild epigastric tenderness.   Musculoskeletal: Normal range of motion.  Neurological: She is alert and oriented to person, place, and time. She has normal strength. No cranial nerve deficit or sensory deficit. She exhibits normal muscle tone. Coordination normal.  Skin: Skin is warm, dry and intact.  Psychiatric: She has a normal mood and affect. Her behavior is normal. Judgment and thought content normal.    ED Course  Procedures DIAGNOSTIC STUDIES: Oxygen Saturation is 97% on room air, normal by my interpretation.    COORDINATION OF CARE: 15:16- Evaluated Pt. Pt is awake, alert, and without distress. 15:42- Ordered DG Ribs Unilateral Right, DG Chest 2 View, and US Abdomen Complete 1 time imaging. Ordered Basic metabolic panel, CBC with Differential, and Urinalysis, Routine w reflex microscopic. 16:30- Rechecked Pt. Pt stable in ED with no significant deterioration in condition. Discussed normal findings of chest, rib, and abdomen radiology reports, as well as the interpretation of previous MRI from 09/2012. Pt understands and agrees with clinical course and is prepared for discharge. 16:45- Plan: Home Medications- Tylenol 650 mg; Home Treatments- Hot compress; Recommended follow up- Dr. Lovell Sheehan in 2 weeks if pain course does not improve.      Labs Reviewed  COMPREHENSIVE METABOLIC PANEL - Abnormal; Notable for the following:    Glucose, Bld 109 (*)     GFR calc non Af Amer 84 (*)     All other components within normal limits  URINALYSIS, ROUTINE W REFLEX MICROSCOPIC - Abnormal; Notable for the following:    Specific Gravity, Urine <1.005 (*)     All other components within  normal limits  CBC WITH DIFFERENTIAL  LIPASE, BLOOD  URINE CULTURE   Dg Chest 2 View  01/02/2013  *RADIOLOGY REPORT*  Clinical Data: Flank pain  CHEST - 2 VIEW  Comparison: Chest radiograph 09/01/2012  Findings: Normal mediastinum and cardiac silhouette.  Normal pulmonary  vasculature.  No evidence of effusion, infiltrate, or pneumothorax.  No acute bony abnormality.  IMPRESSION: No acute cardiopulmonary process.   Original Report Authenticated By: Genevive Bi, M.D.    Dg Ribs Unilateral Right  01/02/2013  *RADIOLOGY REPORT*  Clinical Data: Flank pain  RIGHT RIBS - 2 VIEW  Comparison: None.  Findings: No evidence of rib abnormality of the right.  No evidence of fracture.  IMPRESSION: No rib abnormality on the right.   Original Report Authenticated By: Genevive Bi, M.D.    US Abdomen Complete  01/02/2013  *RADIOLOGY REPORT*  Clinical Data:  Abdominal pain  ABDOMINAL ULTRASOUND COMPLETE  Comparison:  09/30/2012  Findings:  Gallbladder:  Previous cholecystectomy.  Common Bile Duct:  Measures 6.6 mm.  Within normal limits in caliber.  Liver: No focal mass lesion identified. Increased in parenchymal echogenicity.  IVC:  Appears normal.  Pancreas:  No abnormality identified.  Spleen:  Measures 11.4 cm in length.  Multiple calcified granulomas noted.  Right kidney:  Normal in size and parenchymal echogenicity.  No evidence of mass or hydronephrosis.  Left kidney:  Normal in size and parenchymal echogenicity.  No evidence of mass or hydronephrosis.  Abdominal Aorta:  Atherosclerotic plaque is identified.  The abdominal aorta measures 2.9 cm in maximum diameter.  IMPRESSION:  1.  Echogenic liver suggestive of hepatic steatosis. 2.  Prior cholecystectomy. 3. Prior granulomatous disease. 4. Aortic atherosclerosis.   Original Report Authenticated By: Signa Kell, M.D.      1. Chest wall pain   2. Abdominal pain       MDM  Nonspecific chest wall, and abdominal pain. Doubt recurrence of gallbladder  disease, pneumonia, PE, colitis, urinary tract infection, or direct surgical complication. There is a strong musculoskeletal component to her discomfort. She may benefit from physical therapy. She is stable for discharge.      I personally performed the services described in this documentation, which was scribed in my presence. The recorded information has been reviewed and is accurate.      Plan: Home Medications- Tylenol and heat, Tramadol prn; Home Treatments- rest; Recommended follow up- PCP and Gen. Sirg. prn    Flint Melter, MD 01/02/13 2032

## 2013-01-02 NOTE — ED Notes (Signed)
Pain rt flank, Has had cholecystectomy , and stone removal.  , cont to have pain.  No NV, Had diarrhea x1 yesterday

## 2013-01-02 NOTE — ED Notes (Signed)
Pt in [place]. Asked lab to draw there to help with wait time. [place] stated she would be there approx 

## 2013-01-04 LAB — URINE CULTURE: Colony Count: 45000

## 2013-01-08 ENCOUNTER — Ambulatory Visit: Admitting: Urgent Care

## 2013-02-05 ENCOUNTER — Ambulatory Visit (INDEPENDENT_AMBULATORY_CARE_PROVIDER_SITE_OTHER): Payer: Medicare Other | Admitting: Gastroenterology

## 2013-02-05 ENCOUNTER — Encounter: Payer: Self-pay | Admitting: Gastroenterology

## 2013-02-05 VITALS — BP 117/67 | HR 65 | Temp 97.4°F | Ht 62.0 in | Wt 293.0 lb

## 2013-02-05 DIAGNOSIS — R1011 Right upper quadrant pain: Secondary | ICD-10-CM | POA: Diagnosis not present

## 2013-02-05 DIAGNOSIS — Z86010 Personal history of colonic polyps: Secondary | ICD-10-CM | POA: Diagnosis not present

## 2013-02-05 DIAGNOSIS — R079 Chest pain, unspecified: Secondary | ICD-10-CM | POA: Diagnosis not present

## 2013-02-05 LAB — COMPREHENSIVE METABOLIC PANEL WITH GFR
ALT: 21 U/L (ref 0–35)
AST: 21 U/L (ref 0–37)
Albumin: 4 g/dL (ref 3.5–5.2)
Alkaline Phosphatase: 74 U/L (ref 39–117)
BUN: 15 mg/dL (ref 6–23)
CO2: 31 meq/L (ref 19–32)
Calcium: 9.2 mg/dL (ref 8.4–10.5)
Chloride: 103 meq/L (ref 96–112)
Creat: 0.73 mg/dL (ref 0.50–1.10)
Glucose, Bld: 106 mg/dL — ABNORMAL HIGH (ref 70–99)
Potassium: 4.5 meq/L (ref 3.5–5.3)
Sodium: 141 meq/L (ref 135–145)
Total Bilirubin: 0.4 mg/dL (ref 0.3–1.2)
Total Protein: 6.8 g/dL (ref 6.0–8.3)

## 2013-02-05 NOTE — Patient Instructions (Addendum)
I have ordered a CT of your chest and belly.   Further recommendations to follow in near future.

## 2013-02-05 NOTE — Progress Notes (Signed)
Faxed to PCP

## 2013-02-05 NOTE — Assessment & Plan Note (Signed)
Surveillance due June 2014.

## 2013-02-05 NOTE — Assessment & Plan Note (Signed)
72 year old female with history of cholecystectomy and subsequent ERCP with stone extraction and sphincterotomy secondary to choledocholithiasis. Presents today with right upper quadrant, right rib/costal margin pain that is overall constant. Interestingly, this is worsened by movement, "jarring", bending. No association with eating, drinking, bowel movements. No concerning signs. Her LFTs have remained normal since ERCP, and recent US of abdomen with CBD dilation. I strongly suspect a musculoskeletal component, unable to rule out incisional hernia. May have adhesive disease. CXR was negative for rib fracture, but she exhibits tenderness with palpation in the right lower ribs/intercostal margins. I doubt these symptoms have a GI origin; however, we will obtain a CMP, CT chest and abd in near future. Further recommendations to follow. May need pain management referral.

## 2013-02-05 NOTE — Progress Notes (Signed)
Referring Provider: Mirna Mires, MD Primary Care Physician:  Evlyn Courier, MD Primary Gastroenterologist: Dr. Jena Gauss   Chief Complaint  Patient presents with  . Abdominal Pain    right side    HPI:   [name] is a 72 year old female with a history of laparoscopic cholecystectomy last year with subsequent choledocholithiasis requiring ERCP with sphincterotomy and balloon stone extraction Nov 2013 with Dr. Jena Gauss. She was seen in the ED Feb 2014 secondary to RUQ pain. At that time, LFTs were normal. Lipase normal. CXR normal. No evidence of fracture of ribs. US abdomen with fatty liver, CBD measuring normal at 6.6 mm.  Returns stating RUQ pain returned about 2 weeks after ERCP. States pain is slightly different. Has to hold abdomen because it "jars" and is painful. Any bumps into something takes her breath away. Lasts a few seconds then lets up. However, she states pain is always there. This morning whil cleaning herself, felt the pain almost under rib and her back. Can't bend over to wash legs/feet due to pain. Radiates up into right side of chest, right lateral side, sometimes right shoulder blade. Once she is in bed, no problems sleeping. Sitting with her feet up on the ottoman causes pain; she has to lean to her left and "hold it" to keep pressure off. Described as a "punching" pain. Feels a bulge at this area.  States sometimes eases up with a good loose stool. Will each chocolate ice cream to have a good BM. BM daily. Sometimes BM 2-4 times per day. NOT diarrhea. Feels like bowel movements are productive, no constipation.   Past Medical History  Diagnosis Date  . GERD (gastroesophageal reflux disease)   . Fibromyalgia   . Anxiety   . Depression   . Obesity, morbid (more than 100 lbs over ideal weight or BMI > 40)   . Hyperlipemia   . Hypothyroidism   . Obstructive sleep apnea on CPAP and O2  . Migraine   . Colon polyp     TCS 2005 TICS Melba, TCS/PROPOFOL 2011 SIMPLE ADENOMAS  .  Allergic rhinitis   . Osteoarthritis   . Palpitations   . Narcolepsy   . Cholecystitis   . Afib     a. s/p DCCV 01/2012 but did not hold.  . Diverticulosis 2005  . Internal hemorrhoids     H/o Rectal bleeding secondary to internal hemorrhoids 04/2010.  . Volume overload     uses torsemide PRN  . Fatty liver   . Gout   . Asthma     pt states she "believe it is more allergies"    Past Surgical History  Procedure Laterality Date  . Abdominal hysterectomy  FIBROIDS 1982  . Rhinoplasty    . Bladder suspension  1982  . Kidney stent  1995  . Cardioversion  02/05/2012    Procedure: CARDIOVERSION;  Surgeon: Pamella Pert, MD;  Location: [facility] OR;  Service: Cardiovascular;  Laterality: N/A;  . Cholecystectomy  09/03/2012    Procedure: LAPAROSCOPIC CHOLECYSTECTOMY;  Surgeon: Dalia Heading, MD;  Location: [facility] ORS;  Service: General;  Laterality: N/A;  . Colonoscopy  2005    Dr. Katrinka Blazing: sigmoid diverticulosis  . Colonoscopy  June 2011    Dr. Darrick Penna: internal hemorrhoids, simple adenomas, hyperplastic polyps, needs surveillance in June 2014 with overtube and Propofol  . Sphincterotomy  10/01/2012    Procedure: SPHINCTEROTOMY;  Surgeon: Corbin Ade, MD;  Location: [facility] OR[facility];  Service: Endoscopy;  Laterality: N/A;  .  Removal of stones  10/01/2012    Procedure: REMOVAL OF STONES;  Surgeon: Corbin Ade, MD;  Location: [facility];  Service: Endoscopy;  Laterality: N/A;  . Ercp  10/01/2012    Procedure: ENDOSCOPIC RETROGRADE CHOLANGIOPANCREATOGRAPHY (ERCP);  Surgeon: Corbin Ade, MD;  Location: [facility];  Service: Endoscopy;  Laterality: N/A;    Current Outpatient Prescriptions  Medication Sig Dispense Refill  . acetaminophen (TYLENOL) 650 MG CR tablet Take 650 mg by mouth every 8 (eight) hours as needed for pain.      Marland Kitchen albuterol (PROVENTIL,VENTOLIN) 90 MCG/ACT inhaler Inhale 2 puffs into the lungs every 6 (six) hours as needed. For shortness of breath      . apixaban (ELIQUIS) 5 MG TABS tablet  Take 5 mg by mouth 2 (two) times daily.      . colchicine 0.6 MG tablet Take 0.6 mg by mouth daily as needed. Gout Attack      . digoxin (LANOXIN) 0.25 MG tablet Take 250 mcg by mouth every evening.       . dimethicone (PROSHIELD PLUS SKIN PROTECTANT) 1 % cream Apply 1 application topically as needed. For itching/protection      . esomeprazole (NEXIUM) 40 MG capsule Take 40 mg by mouth every evening.       Marland Kitchen FLUoxetine (PROZAC) 20 MG capsule Take 20 mg by mouth every morning.       . hydrocortisone cream 1 % Apply 1 application topically as needed. For itching      . LORazepam (ATIVAN) 1 MG tablet Take 1.5 mg by mouth 3 (three) times daily.      . miconazole (MONISTAT 7) 2 % vaginal cream Apply 1 applicator topically at bedtime as needed. Yeast infection      . mometasone (NASONEX) 50 MCG/ACT nasal spray Place 1 spray into the nose at bedtime.      Marland Kitchen oxyCODONE-acetaminophen (PERCOCET/ROXICET) 5-325 MG per tablet Take 1 tablet by mouth every 4 (four) hours as needed. For pain      . propranolol (INNOPRAN XL) 80 MG 24 hr capsule Take 80 mg by mouth every morning.       . cyanocobalamin 500 MCG tablet Take 1,000 mcg by mouth daily.       No current facility-administered medications for this visit.    Allergies as of 02/05/2013 - Review Complete 02/05/2013  Allergen Reaction Noted  . Atorvastatin Other (See Comments) 11/30/2006  . Demerol (meperidine) Other (See Comments) 09/30/2012  . Meperidine hcl Other (See Comments) 11/30/2006  . Zocor (simvastatin - high dose) Other (See Comments) 01/25/2012    Family History  Problem Relation Age of Onset  . Colon cancer Mother 65  . Diabetes Mother   . Cerebral aneurysm Father     History   Social History  . Marital Status: Married    Spouse Name: N/A    Number of Children: N/A  . Years of Education: N/A   Social History Main Topics  . Smoking status: Former Smoker -- 0.50 packs/day for 30 years    Quit date: 10/03/1989  . Smokeless  tobacco: Never Used     Comment: 22 years ago  . Alcohol Use: No  . Drug Use: No  . Sexually Active: No   Other Topics Concern  . None   Social History Narrative  . None    Review of Systems: Negative unless mentioned in HPI.   Physical Exam: BP 117/67  Pulse 65  Temp(Src) 97.4 F (36.3 C) (Oral)  Ht 5\' 2"  (1.575 m)  Wt 293 lb (132.904 kg)  BMI 53.58 kg/m2 General:   Alert and oriented. No distress noted. Pleasant and cooperative.  Head:  Normocephalic and atraumatic. Eyes:  Conjuctiva clear without scleral icterus. Mouth:  Oral mucosa pink and moist. Good dentition. No lesions. Heart:  S1, S2 present without murmurs, rubs, or gallops. Regular rate and rhythm. Abdomen:  +BS, soft, obese. TTP RIGHT COSTAL MARGIN Msk:  Symmetrical without gross deformities. Normal posture. Extremities:  Bilateral lower extremity 1+ edema Neurologic:  Alert and  oriented x4;  grossly normal neurologically. Skin:  Intact without significant lesions or rashes. Psych:  Alert and cooperative. Normal mood and affect.

## 2013-02-05 NOTE — Assessment & Plan Note (Signed)
Unclear etiology. CT chest as ordered.

## 2013-02-05 NOTE — Assessment & Plan Note (Signed)
72 year old female with history of cholecystectomy and subsequent ERCP thereafter secondary to choledocholithiasis

## 2013-02-09 ENCOUNTER — Ambulatory Visit (HOSPITAL_COMMUNITY)
Admission: RE | Admit: 2013-02-09 | Discharge: 2013-02-09 | Disposition: A | Discharge status: Home / Self Care | Payer: Medicare Other | Source: Ambulatory Visit | Attending: Gastroenterology | Admitting: Gastroenterology

## 2013-02-09 ENCOUNTER — Encounter (HOSPITAL_COMMUNITY): Payer: Self-pay

## 2013-02-09 ENCOUNTER — Ambulatory Visit (HOSPITAL_COMMUNITY): Payer: Medicare Other

## 2013-02-09 DIAGNOSIS — R079 Chest pain, unspecified: Secondary | ICD-10-CM | POA: Diagnosis not present

## 2013-02-09 DIAGNOSIS — R911 Solitary pulmonary nodule: Secondary | ICD-10-CM | POA: Insufficient documentation

## 2013-02-09 DIAGNOSIS — R0781 Pleurodynia: Secondary | ICD-10-CM

## 2013-02-09 DIAGNOSIS — R1011 Right upper quadrant pain: Secondary | ICD-10-CM | POA: Diagnosis not present

## 2013-02-09 DIAGNOSIS — Z9089 Acquired absence of other organs: Secondary | ICD-10-CM | POA: Diagnosis not present

## 2013-02-09 DIAGNOSIS — R109 Unspecified abdominal pain: Secondary | ICD-10-CM | POA: Diagnosis not present

## 2013-02-09 DIAGNOSIS — Z9889 Other specified postprocedural states: Secondary | ICD-10-CM | POA: Diagnosis not present

## 2013-02-09 MED ORDER — IOHEXOL 300 MG/ML  SOLN
100.0000 mL | Freq: Once | INTRAMUSCULAR | Status: AC | PRN
  Administered 2013-02-09: 100 mL via INTRAVENOUS

## 2013-02-13 ENCOUNTER — Telehealth: Payer: Self-pay | Admitting: Internal Medicine

## 2013-02-13 NOTE — Telephone Encounter (Signed)
Routing to AS 

## 2013-02-13 NOTE — Telephone Encounter (Signed)
Pt called to see if her CT results were back. She had CT done on Monday. Please call her back at [phone]

## 2013-02-16 NOTE — Progress Notes (Signed)
Quick Note:  Nothing to explain RUQ/rib pain. Will need to discuss further with Dr. Jena Gauss. ______

## 2013-02-16 NOTE — Telephone Encounter (Signed)
See result note.  

## 2013-02-17 NOTE — Progress Notes (Signed)
Quick Note:  Pt aware, she stated she was still having the same pain on that R side and the pain radiates around to her back. She is taking hydrocodone from her pcp because she said it hurts anytime she needs to do anything with the R side of her body, such as turning, bending, reaching. ______

## 2013-02-23 NOTE — Progress Notes (Signed)
Quick Note:  Discussed with Dr. Jena Gauss. Due to continued RUQ pain, let's proceed with MRCP to further evaluate. ______

## 2013-02-24 NOTE — Progress Notes (Signed)
Quick Note:    Noted    ______

## 2013-02-24 NOTE — Progress Notes (Signed)
Quick Note:  Pt is aware. She doesn't want to have the MRCP right now but will call when she is ready. ______

## 2013-03-04 DIAGNOSIS — E782 Mixed hyperlipidemia: Secondary | ICD-10-CM | POA: Diagnosis not present

## 2013-03-04 DIAGNOSIS — B1920 Unspecified viral hepatitis C without hepatic coma: Secondary | ICD-10-CM | POA: Diagnosis not present

## 2013-03-04 DIAGNOSIS — I4891 Unspecified atrial fibrillation: Secondary | ICD-10-CM | POA: Diagnosis not present

## 2013-03-04 DIAGNOSIS — E039 Hypothyroidism, unspecified: Secondary | ICD-10-CM | POA: Diagnosis not present

## 2013-03-23 DIAGNOSIS — E662 Morbid (severe) obesity with alveolar hypoventilation: Secondary | ICD-10-CM | POA: Diagnosis not present

## 2013-03-23 DIAGNOSIS — I10 Essential (primary) hypertension: Secondary | ICD-10-CM | POA: Diagnosis not present

## 2013-03-23 DIAGNOSIS — I4891 Unspecified atrial fibrillation: Secondary | ICD-10-CM | POA: Diagnosis not present

## 2013-03-23 DIAGNOSIS — G4733 Obstructive sleep apnea (adult) (pediatric): Secondary | ICD-10-CM | POA: Diagnosis not present

## 2013-04-14 DIAGNOSIS — I4891 Unspecified atrial fibrillation: Secondary | ICD-10-CM | POA: Diagnosis not present

## 2013-04-14 DIAGNOSIS — R0602 Shortness of breath: Secondary | ICD-10-CM | POA: Diagnosis not present

## 2013-04-17 ENCOUNTER — Ambulatory Visit (INDEPENDENT_AMBULATORY_CARE_PROVIDER_SITE_OTHER): Payer: Medicare Other | Admitting: Internal Medicine

## 2013-04-17 ENCOUNTER — Telehealth: Payer: Self-pay | Admitting: *Deleted

## 2013-04-17 ENCOUNTER — Encounter: Payer: Self-pay | Admitting: Internal Medicine

## 2013-04-17 VITALS — BP 125/62 | HR 74 | Ht 62.0 in | Wt 296.0 lb

## 2013-04-17 DIAGNOSIS — G4733 Obstructive sleep apnea (adult) (pediatric): Secondary | ICD-10-CM | POA: Diagnosis not present

## 2013-04-17 DIAGNOSIS — I4891 Unspecified atrial fibrillation: Secondary | ICD-10-CM | POA: Diagnosis not present

## 2013-04-17 MED ORDER — FLECAINIDE ACETATE 100 MG PO TABS: 100.0000 mg | ORAL_TABLET | Freq: Two times a day (BID) | ORAL | Status: DC

## 2013-04-17 NOTE — Telephone Encounter (Signed)
Called Mrs. Kashani to give her the number for the bariatric center at [facility]-- Dr Daphine Deutscher. The protocol is for the patient to call and get registered for the bariatric class. After the patient has completed the class,then an appointment will be made the MD. Patient is aware and states she will give them a call.

## 2013-04-17 NOTE — Progress Notes (Signed)
Primary Care Physician: Evlyn Courier, MD Referring Physician:  Dr Romie Levee is a 72 y.o. female with a h/o morbid obesity, obstructive sleep apnea, and long standing persistent atrial fibrillation who presents today for EP consultation.  She reports initially being diagnosed with atrial fibrillation a year 2013 though she has had palpitations "for years".  She reports that her afib was first diagnosed on routine physical exam.  She was referred to Dr Jacinto Halim and underwent cardioversion 3/13.  She report feeling "much better" in sinus rhythm.  Her SOB and fatigue improved.  Her exercise tolerance also improved significantly.  Unfortunately, 2 weeks later, her afib returned.  She has been in afib since that time (over a year).  She has not tried antiarrhythmic drug therapy.  She reports occasional palpitations and frequent SOB.  She reports that she is SOB with minimal exertion.  She has chronic lower extremity edema.  Today, she denies symptoms of chest pain, orthopnea, PND, dizziness, presyncope, syncope, or neurologic sequela. The patient is tolerating medications without difficulties and is otherwise without complaint today.   Past Medical History  Diagnosis Date  . GERD (gastroesophageal reflux disease)   . Fibromyalgia   . Anxiety   . Depression   . Obesity, morbid (more than 100 lbs over ideal weight or BMI > 40)   . Hyperlipemia   . Hypothyroidism   . Obstructive sleep apnea on CPAP and O2  . Migraine   . Colon polyp     TCS 2005 TICS [place], TCS/PROPOFOL 2011 SIMPLE [place]ENOMAS  . Allergic rhinitis   . Osteoarthritis   . Narcolepsy   . Cholecystitis   . Persistent atrial fibrillation     a. s/p DCCV 01/2012  . Diverticulosis 2005  . Internal hemorrhoids     H/o Rectal bleeding secondary to internal hemorrhoids 04/2010.  . Volume overload     uses torsemide PRN  . Fatty liver   . Gout   . Asthma     pt states she "believe it is more allergies"   Past Surgical History    Procedure Laterality Date  . Abdominal hysterectomy  FIBROIDS 1982  . Rhinoplasty    . Bladder suspension  1982  . Kidney stent  1995  . Cardioversion  02/05/2012    Procedure: CARDIOVERSION;  Surgeon: Pamella Pert, MD;  Location: [facility] OR;  Service: Cardiovascular;  Laterality: N/A;  . Cholecystectomy  09/03/2012    Procedure: LAPAROSCOPIC CHOLECYSTECTOMY;  Surgeon: Dalia Heading, MD;  Location: [facility] ORS;  Service: General;  Laterality: N/A;  . Colonoscopy  2005    Dr. Katrinka Blazing: sigmoid diverticulosis  . Colonoscopy  June 2011    Dr. Darrick Penna: internal hemorrhoids, simple adenomas, hyperplastic polyps, needs surveillance in June 2014 with overtube and Propofol  . Sphincterotomy  10/01/2012    Procedure: SPHINCTEROTOMY;  Surgeon: Corbin Ade, MD;  Location: [facility] ORS;  Service: Endoscopy;  Laterality: N/A;  . Removal of stones  10/01/2012    Procedure: REMOVAL OF STONES;  Surgeon: Corbin Ade, MD;  Location: [facility] ORS;  Service: Endoscopy;  Laterality: N/A;  . Ercp  10/01/2012    Procedure: ENDOSCOPIC RETROGRADE CHOLANGIOPANCREATOGRAPHY (ERCP);  Surgeon: Corbin Ade, MD;  Location: [facility];  Service: Endoscopy;  Laterality: N/A;    Current Outpatient Prescriptions  Medication Sig Dispense Refill  . acetaminophen (TYLENOL) 650 MG CR tablet Take 650 mg by mouth every 8 (eight) hours as needed for pain.      Marland Kitchen  albuterol (PROVENTIL,VENTOLIN) 90 MCG/ACT inhaler Inhale 2 puffs into the lungs every 6 (six) hours as needed. For shortness of breath      . apixaban (ELIQUIS) 5 MG TABS tablet Take 5 mg by mouth 2 (two) times daily.      . colchicine 0.6 MG tablet Take 0.6 mg by mouth daily as needed. Gout Attack      . digoxin (LANOXIN) 0.25 MG tablet Take 250 mcg by mouth every evening.       . dimethicone (PROSHIELD PLUS SKIN PROTECTANT) 1 % cream Apply 1 application topically as needed. For itching/protection      . esomeprazole (NEXIUM) 40 MG capsule Take 40 mg by mouth every evening.       Marland Kitchen  FLUoxetine (PROZAC) 20 MG capsule Take 20 mg by mouth every morning.       . hydrocortisone cream 1 % Apply 1 application topically as needed. For itching      . LORazepam (ATIVAN) 1 MG tablet Take 1.5 mg by mouth 3 (three) times daily.      . miconazole (MONISTAT 7) 2 % vaginal cream Apply 1 applicator topically at bedtime as needed. Yeast infection      . mometasone (NASONEX) 50 MCG/ACT nasal spray Place 1 spray into the nose at bedtime.      . propranolol (INNOPRAN XL) 80 MG 24 hr capsule Take 80 mg by mouth every morning.        No current facility-administered medications for this visit.    Allergies  Allergen Reactions  . Atorvastatin Other (See Comments)    Muscle sorness  . Demerol (Meperidine) Other (See Comments)    Bottoms BP.   . Meperidine Hcl Other (See Comments)    Drops BP  . Zocor (Simvastatin - High Dose) Other (See Comments)    Elevated liver enzymes    History   Social History  . Marital Status: Married    Spouse Name: N/A    Number of Children: N/A  . Years of Education: N/A   Occupational History  . Not on file.   Social History Main Topics  . Smoking status: Former Smoker -- 0.50 packs/day for 30 years    Quit date: 10/03/1989  . Smokeless tobacco: Never Used     Comment: 22 years ago  . Alcohol Use: No  . Drug Use: No  . Sexually Active: No   Other Topics Concern  . Not on file   Social History Narrative   Pt lives in [place] with spouse.  Housewife         Family History  Problem Relation Age of Onset  . Colon cancer Mother 19  . Diabetes Mother   . Cerebral aneurysm Father     ROS- All systems are reviewed and negative except as per the HPI above.  She has noticed a rash over her arms and trunk for about a month.  Physical Exam: Filed Vitals:   04/17/13 1051  BP: 125/62  Pulse: 74  Height: 5\' 2"  (1.575 m)  Weight: 296 lb (134.265 kg)  SpO2: 99%    GEN- The patient is overweight appearing, alert and oriented x 3 today.     Head- normocephalic, atraumatic Eyes-  Sclera clear, conjunctiva pink Ears- hearing intact Oropharynx- clear Neck- supple, no JVP Lymph- no cervical lymphadenopathy Lungs- Clear to ausculation bilaterally, normal work of breathing Heart- Regular rate and rhythm, no murmurs, rubs or gallops, PMI not laterally displaced GI- soft, NT, ND, +  BS Extremities- no clubbing, cyanosis, or edema MS- no significant deformity or atrophy Skin- no rash or lesion Psych- euthymic mood, full affect Neuro- strength and sensation are intact  EKG today reveals afib 69 bpm Echo from Dr Verl Dicker office 04/14/13 reveals normal EF, LA size 45 mm  Assessment and Plan:  1. Longstanding persistent atrial fibrillation She has had persistent afib for more than a year.  I think that given her morbid obesity and atrial enlargement that our ability to maintain sinus rhythm long term is limited.  At this time, we will try to achieve sinus rhythm with AAD therapy.  She does not have CAD.  I will therefore start flecainide 100mg  BID today.  She will follow up with Dr Jacinto Halim in a few weeks.  IF she remains in afib at that time, then she will require cardioversion.  If she continues to have sinus rhythm with flecainide then she will eventually need GXT myoview to assess for exercise induced arrhythmias/ ischemic.  However if flecainide is ineffective then she will stop this medicine and tikosyn would be the next drug option. She is not a candidate for ablation long term.  2. Obesity Weight loss is strongly encouraged I will refer her to Dr Daphine Deutscher for weight loss surgery evaluation  3. OSA Compliance with CPAP is advised  Return as needed Follow closely with Dr Jacinto Halim

## 2013-04-17 NOTE — Patient Instructions (Addendum)
You have been referred to Dr Daphine Deutscher at [facility] with the bariatric program.  Start Flecainide 100mg  1 tablet twice daily.  Follow up with Dr Jacinto Halim

## 2013-04-23 ENCOUNTER — Encounter: Payer: Self-pay | Admitting: Internal Medicine

## 2013-05-12 ENCOUNTER — Encounter: Payer: Self-pay | Admitting: Internal Medicine

## 2013-05-18 DIAGNOSIS — R0602 Shortness of breath: Secondary | ICD-10-CM | POA: Diagnosis not present

## 2013-05-18 DIAGNOSIS — I5030 Unspecified diastolic (congestive) heart failure: Secondary | ICD-10-CM | POA: Diagnosis not present

## 2013-05-18 DIAGNOSIS — I4891 Unspecified atrial fibrillation: Secondary | ICD-10-CM | POA: Diagnosis not present

## 2013-05-27 DIAGNOSIS — I4891 Unspecified atrial fibrillation: Secondary | ICD-10-CM | POA: Diagnosis not present

## 2013-05-27 DIAGNOSIS — E662 Morbid (severe) obesity with alveolar hypoventilation: Secondary | ICD-10-CM | POA: Diagnosis not present

## 2013-05-27 DIAGNOSIS — I10 Essential (primary) hypertension: Secondary | ICD-10-CM | POA: Diagnosis not present

## 2013-06-03 DIAGNOSIS — R32 Unspecified urinary incontinence: Secondary | ICD-10-CM | POA: Diagnosis not present

## 2013-06-03 DIAGNOSIS — E669 Obesity, unspecified: Secondary | ICD-10-CM | POA: Diagnosis not present

## 2013-06-03 DIAGNOSIS — R0681 Apnea, not elsewhere classified: Secondary | ICD-10-CM | POA: Diagnosis not present

## 2013-06-03 DIAGNOSIS — I4891 Unspecified atrial fibrillation: Secondary | ICD-10-CM | POA: Diagnosis not present

## 2013-06-09 ENCOUNTER — Encounter (HOSPITAL_COMMUNITY): Payer: Self-pay | Admitting: Certified Registered Nurse Anesthetist

## 2013-06-09 ENCOUNTER — Ambulatory Visit (HOSPITAL_COMMUNITY)
Admission: RE | Admit: 2013-06-09 | Discharge: 2013-06-09 | Disposition: A | Discharge status: Home / Self Care | Payer: Medicare Other | Source: Ambulatory Visit | Attending: Cardiology | Admitting: Cardiology

## 2013-06-09 ENCOUNTER — Ambulatory Visit (HOSPITAL_COMMUNITY): Payer: Medicare Other | Admitting: Certified Registered Nurse Anesthetist

## 2013-06-09 ENCOUNTER — Encounter (HOSPITAL_COMMUNITY): Payer: Self-pay | Admitting: Gastroenterology

## 2013-06-09 ENCOUNTER — Encounter (HOSPITAL_COMMUNITY)
Admission: RE | Disposition: A | Discharge status: Home / Self Care | Payer: Self-pay | Source: Ambulatory Visit | Attending: Cardiology

## 2013-06-09 DIAGNOSIS — Z800 Family history of malignant neoplasm of digestive organs: Secondary | ICD-10-CM | POA: Insufficient documentation

## 2013-06-09 DIAGNOSIS — F411 Generalized anxiety disorder: Secondary | ICD-10-CM | POA: Diagnosis not present

## 2013-06-09 DIAGNOSIS — Z6841 Body Mass Index (BMI) 40.0 and over, adult: Secondary | ICD-10-CM | POA: Insufficient documentation

## 2013-06-09 DIAGNOSIS — F329 Major depressive disorder, single episode, unspecified: Secondary | ICD-10-CM | POA: Insufficient documentation

## 2013-06-09 DIAGNOSIS — Z8249 Family history of ischemic heart disease and other diseases of the circulatory system: Secondary | ICD-10-CM | POA: Insufficient documentation

## 2013-06-09 DIAGNOSIS — G4733 Obstructive sleep apnea (adult) (pediatric): Secondary | ICD-10-CM | POA: Diagnosis not present

## 2013-06-09 DIAGNOSIS — E662 Morbid (severe) obesity with alveolar hypoventilation: Secondary | ICD-10-CM | POA: Insufficient documentation

## 2013-06-09 DIAGNOSIS — Z87891 Personal history of nicotine dependence: Secondary | ICD-10-CM | POA: Diagnosis not present

## 2013-06-09 DIAGNOSIS — K219 Gastro-esophageal reflux disease without esophagitis: Secondary | ICD-10-CM | POA: Insufficient documentation

## 2013-06-09 DIAGNOSIS — I4891 Unspecified atrial fibrillation: Secondary | ICD-10-CM | POA: Insufficient documentation

## 2013-06-09 DIAGNOSIS — E785 Hyperlipidemia, unspecified: Secondary | ICD-10-CM | POA: Insufficient documentation

## 2013-06-09 DIAGNOSIS — F3289 Other specified depressive episodes: Secondary | ICD-10-CM | POA: Insufficient documentation

## 2013-06-09 DIAGNOSIS — I10 Essential (primary) hypertension: Secondary | ICD-10-CM | POA: Insufficient documentation

## 2013-06-09 DIAGNOSIS — E039 Hypothyroidism, unspecified: Secondary | ICD-10-CM | POA: Insufficient documentation

## 2013-06-09 DIAGNOSIS — Z7901 Long term (current) use of anticoagulants: Secondary | ICD-10-CM | POA: Insufficient documentation

## 2013-06-09 DIAGNOSIS — Z79899 Other long term (current) drug therapy: Secondary | ICD-10-CM | POA: Diagnosis not present

## 2013-06-09 HISTORY — PX: CARDIOVERSION: SHX1299

## 2013-06-09 LAB — BASIC METABOLIC PANEL
BUN: 15 mg/dL (ref 6–23)
CO2: 27 mEq/L (ref 19–32)
Calcium: 9.6 mg/dL (ref 8.4–10.5)
Chloride: 101 mEq/L (ref 96–112)
Creatinine, Ser: 0.8 mg/dL (ref 0.50–1.10)
Glucose, Bld: 107 mg/dL — ABNORMAL HIGH (ref 70–99)

## 2013-06-09 SURGERY — CARDIOVERSION
Anesthesia: Monitor Anesthesia Care

## 2013-06-09 MED ORDER — LIDOCAINE HCL (CARDIAC) 20 MG/ML IV SOLN
INTRAVENOUS | Status: DC | PRN
  Administered 2013-06-09: 30 mg via INTRAVENOUS

## 2013-06-09 MED ORDER — SODIUM CHLORIDE 0.9 % IV SOLN
INTRAVENOUS | Status: DC
  Administered 2013-06-09: 13:00:00 via INTRAVENOUS

## 2013-06-09 MED ORDER — PROPOFOL 10 MG/ML IV BOLUS
INTRAVENOUS | Status: DC | PRN
  Administered 2013-06-09: 100 mg via INTRAVENOUS

## 2013-06-09 MED ORDER — SODIUM CHLORIDE 0.9 % IV SOLN
INTRAVENOUS | Status: DC | PRN
  Administered 2013-06-09: 13:00:00 via INTRAVENOUS

## 2013-06-09 NOTE — H&P (Signed)
  Please see office visit notes for complete details of HPI.  

## 2013-06-09 NOTE — CV Procedure (Signed)
Direct current cardioversion:  Indication symptomatic A. Fibrillation.  Procedure: Using 100 mg of IV Propofol 30 mg lidocaine for achieving deep (Moderate sedation), synchronized direct current cardioversion performed. Patient was delivered with 120, then 200 Joules of electricity X 2 with success to NSR. Patient tolerated the procedure well. No immediate complication noted.

## 2013-06-09 NOTE — Anesthesia Postprocedure Evaluation (Signed)
  Anesthesia Post-op Note  Patient: [name]  Procedure(s) Performed: Procedure(s) with comments: CARDIOVERSION (N/A) - h&p in file-[facility]  Patient Location: Endoscopy Unit  Anesthesia Type:General  Level of Consciousness: awake, oriented, sedated and patient cooperative  Airway and Oxygen Therapy: Patient Spontanous Breathing  Post-op Pain: none  Post-op Assessment: Post-op Vital signs reviewed, Patient's Cardiovascular Status Stable, Respiratory Function Stable, Patent Airway, No signs of Nausea or vomiting and Pain level controlled  Post-op Vital Signs: stable  Complications: No apparent anesthesia complications

## 2013-06-09 NOTE — Transfer of Care (Signed)
Immediate Anesthesia Transfer of Care Note  Patient: [name]  Procedure(s) Performed: Procedure(s) with comments: CARDIOVERSION (N/A) - h&p in file-[facility]  Patient Location: PACU  Anesthesia Type:MAC  Level of Consciousness: awake and alert   Airway & Oxygen Therapy: Patient Spontanous Breathing and Patient connected to nasal cannula oxygen  Post-op Assessment: Report given to PACU RN and Post -op Vital signs reviewed and stable  Post vital signs: Reviewed and stable  Complications: No apparent anesthesia complications

## 2013-06-09 NOTE — Interval H&P Note (Signed)
History and Physical Interval Note:  06/09/2013 1:04 PM  [name]  has presented today for surgery, with the diagnosis of AFIB  The various methods of treatment have been discussed with the patient and family. After consideration of risks, benefits and other options for treatment, the patient has consented to  Procedure(s) with comments: CARDIOVERSION (N/A) - h&p in file-[facility] as a surgical intervention .  The patient's history has been reviewed, patient examined, no change in status, stable for surgery.  I have reviewed the patient's chart and labs.  Questions were answered to the patient's satisfaction.     [name]

## 2013-06-09 NOTE — Preoperative (Signed)
Beta Blockers   Reason not to administer Beta Blockers:Not Applicable 

## 2013-06-09 NOTE — Anesthesia Preprocedure Evaluation (Addendum)
Anesthesia Evaluation  Patient identified by MRN, date of birth, ID band Patient awake    Reviewed: Allergy & Precautions, H&P , NPO status , Patient's Chart, lab work & pertinent test results  History of Anesthesia Complications Negative for: history of anesthetic complications  Airway Mallampati: II TM Distance: >3 FB Neck ROM: Full    Dental  (+) Dental Advisory Given   Pulmonary shortness of breath, asthma , sleep apnea ,          Cardiovascular + Peripheral Vascular Disease and +CHF + dysrhythmias     Neuro/Psych Anxiety Depression    GI/Hepatic GERD-  ,  Endo/Other  Hypothyroidism   Renal/GU      Musculoskeletal  (+) Fibromyalgia -  Abdominal   Peds  Hematology   Anesthesia Other Findings   Reproductive/Obstetrics                          Anesthesia Physical Anesthesia Plan  ASA: III  Anesthesia Plan: MAC   Post-op Pain Management:    Induction: Intravenous  Airway Management Planned: Mask  Additional Equipment:   Intra-op Plan:   Post-operative Plan:   Informed Consent: I have reviewed the patients History and Physical, chart, labs and discussed the procedure including the risks, benefits and alternatives for the proposed anesthesia with the patient or authorized representative who has indicated his/her understanding and acceptance.     Plan Discussed with: CRNA, Anesthesiologist and Surgeon  Anesthesia Plan Comments:        Anesthesia Quick Evaluation

## 2013-06-10 ENCOUNTER — Encounter (HOSPITAL_COMMUNITY): Payer: Self-pay | Admitting: Cardiology

## 2013-06-17 DIAGNOSIS — R0989 Other specified symptoms and signs involving the circulatory and respiratory systems: Secondary | ICD-10-CM | POA: Diagnosis not present

## 2013-06-17 DIAGNOSIS — R0609 Other forms of dyspnea: Secondary | ICD-10-CM | POA: Diagnosis not present

## 2013-06-17 DIAGNOSIS — I4891 Unspecified atrial fibrillation: Secondary | ICD-10-CM | POA: Diagnosis not present

## 2013-06-17 DIAGNOSIS — Z9889 Other specified postprocedural states: Secondary | ICD-10-CM | POA: Diagnosis not present

## 2013-06-17 DIAGNOSIS — R5381 Other malaise: Secondary | ICD-10-CM | POA: Diagnosis not present

## 2013-07-10 DIAGNOSIS — I4891 Unspecified atrial fibrillation: Secondary | ICD-10-CM | POA: Diagnosis not present

## 2013-07-10 DIAGNOSIS — E662 Morbid (severe) obesity with alveolar hypoventilation: Secondary | ICD-10-CM | POA: Diagnosis not present

## 2013-07-10 DIAGNOSIS — G4733 Obstructive sleep apnea (adult) (pediatric): Secondary | ICD-10-CM | POA: Diagnosis not present

## 2013-09-02 DIAGNOSIS — I4891 Unspecified atrial fibrillation: Secondary | ICD-10-CM | POA: Diagnosis not present

## 2013-09-02 DIAGNOSIS — E782 Mixed hyperlipidemia: Secondary | ICD-10-CM | POA: Diagnosis not present

## 2013-09-02 DIAGNOSIS — Z23 Encounter for immunization: Secondary | ICD-10-CM | POA: Diagnosis not present

## 2013-09-02 DIAGNOSIS — E039 Hypothyroidism, unspecified: Secondary | ICD-10-CM | POA: Diagnosis not present

## 2013-09-02 DIAGNOSIS — E669 Obesity, unspecified: Secondary | ICD-10-CM | POA: Diagnosis not present

## 2013-10-16 DIAGNOSIS — I4891 Unspecified atrial fibrillation: Secondary | ICD-10-CM | POA: Diagnosis not present

## 2013-10-16 DIAGNOSIS — R358 Other polyuria: Secondary | ICD-10-CM | POA: Diagnosis not present

## 2013-10-16 DIAGNOSIS — E662 Morbid (severe) obesity with alveolar hypoventilation: Secondary | ICD-10-CM | POA: Diagnosis not present

## 2013-10-16 DIAGNOSIS — Z9889 Other specified postprocedural states: Secondary | ICD-10-CM | POA: Diagnosis not present

## 2013-12-07 IMAGING — US US ABDOMEN COMPLETE
1 series · 13 of 25 positions shown · non-contrast
Comparison: MRI 02/14/2011

CLINICAL DATA: Abdominal pain.

COMPLETE ABDOMINAL ULTRASOUND

[Series 1: us abdomen complete · 0.32mm/px · 13 of 122 slices shown]
[im 1/122]
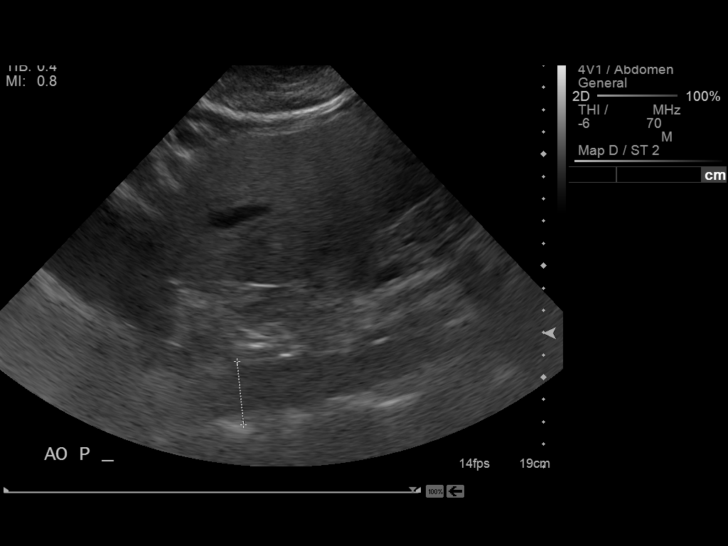
[im 11/122]
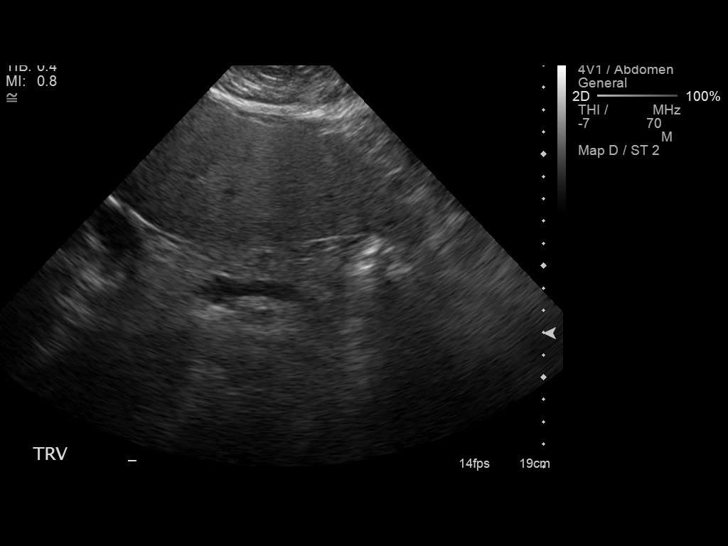
[im 21/122]
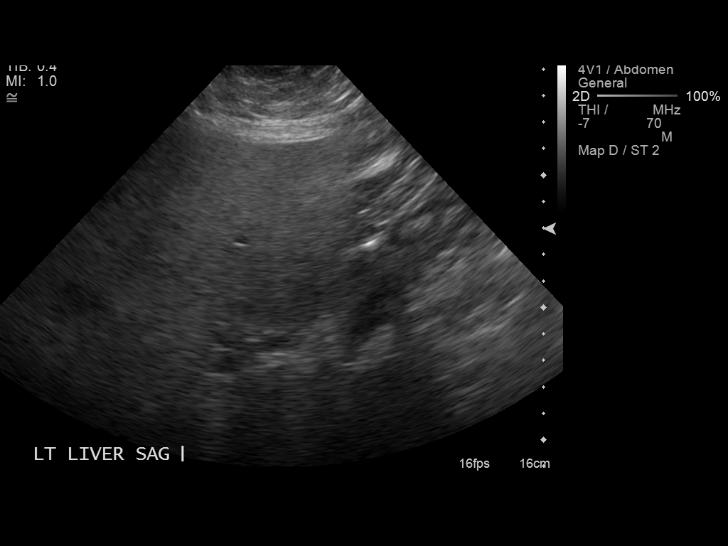
[im 31/122]
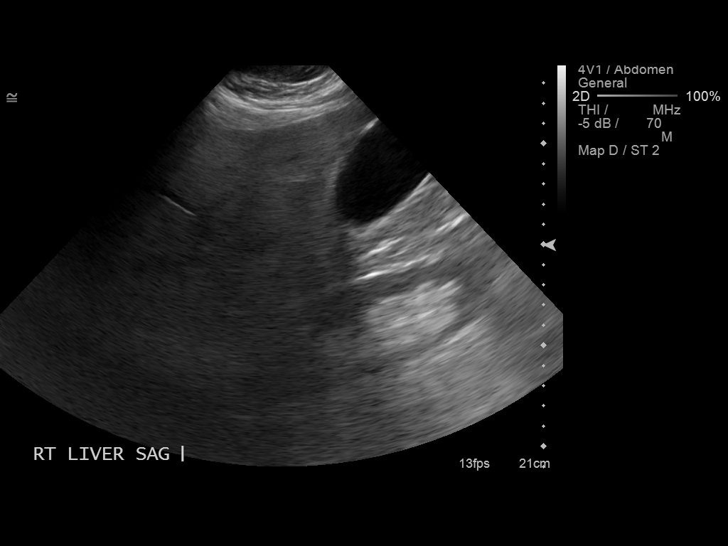
[im 41/122]
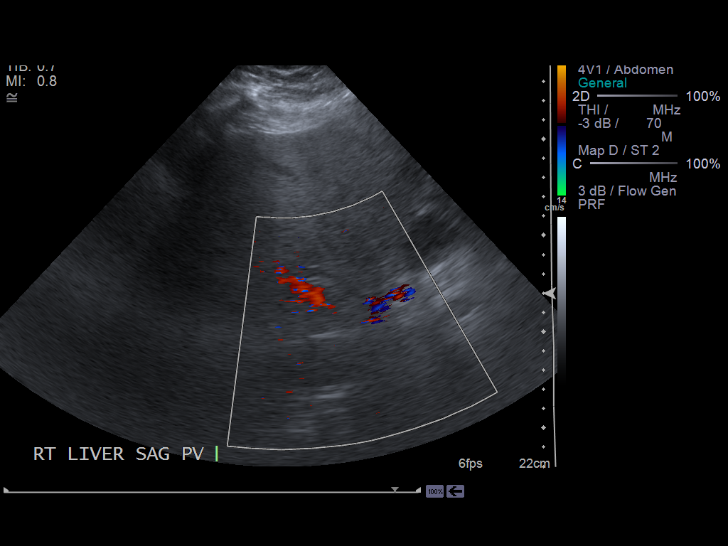
[im 51/122]
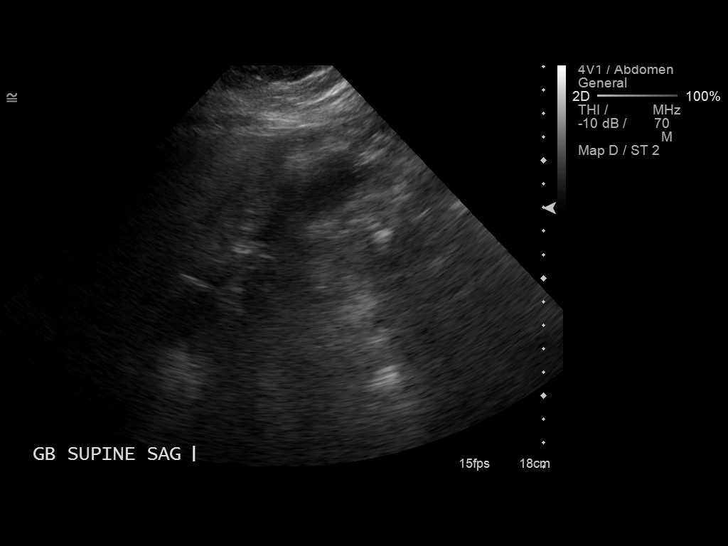
[im 61/122]
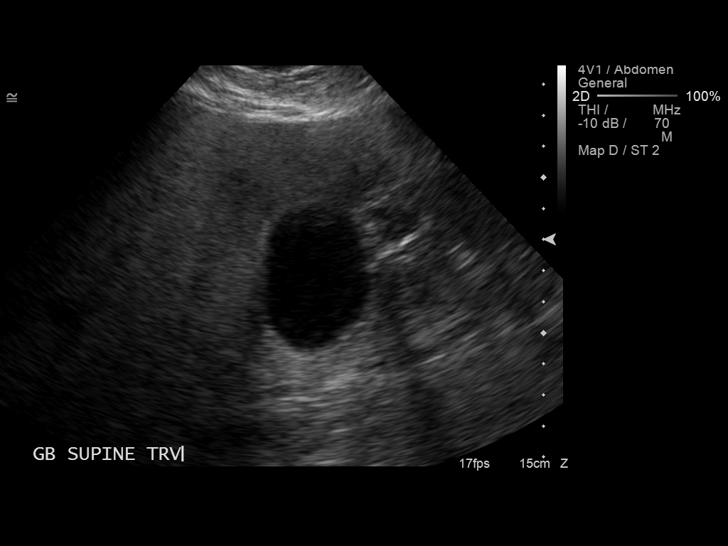
[im 71/122]
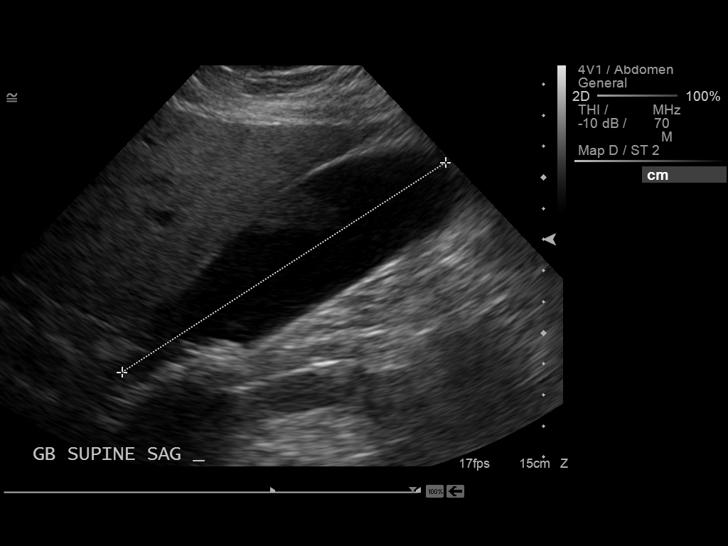
[im 81/122]
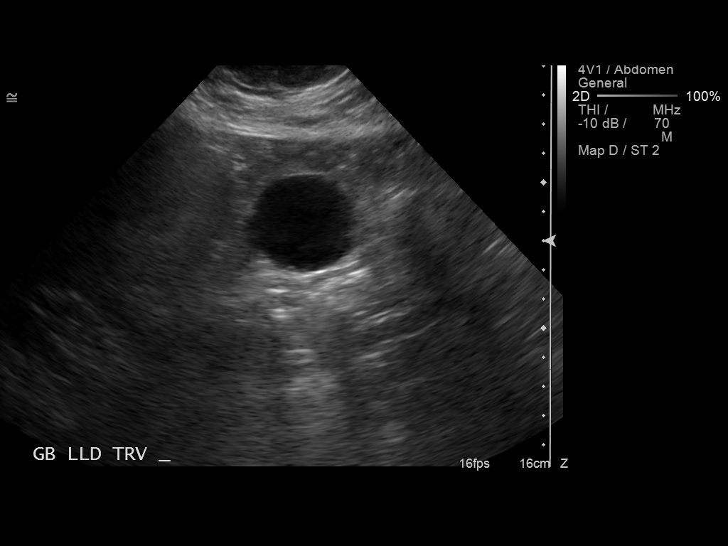
[im 91/122]
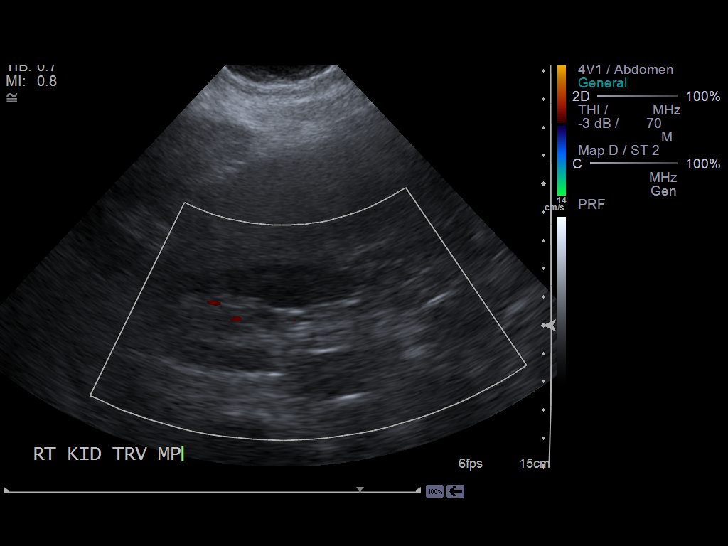
[im 101/122]
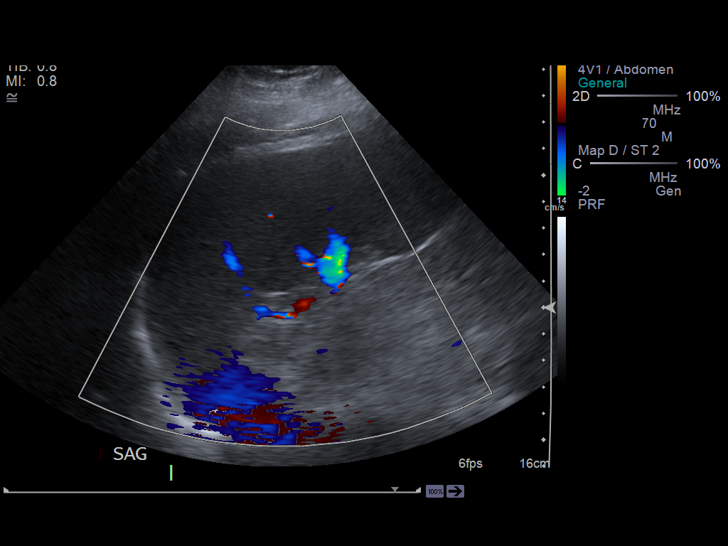
[im 111/122]
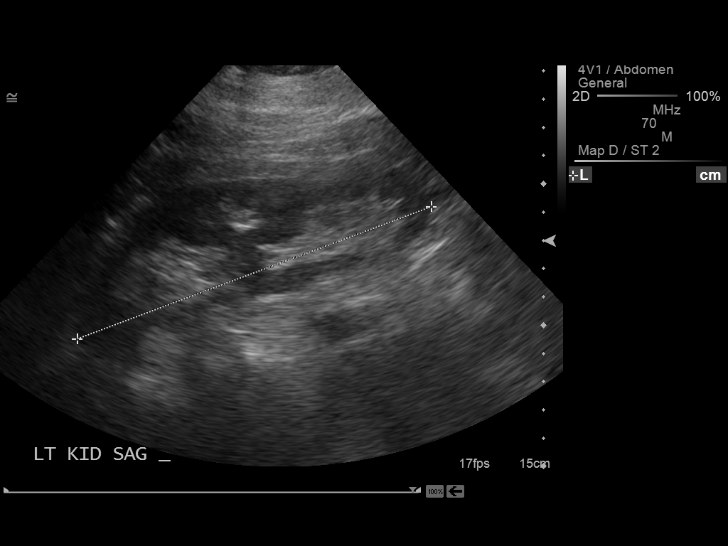
[im 122/122]
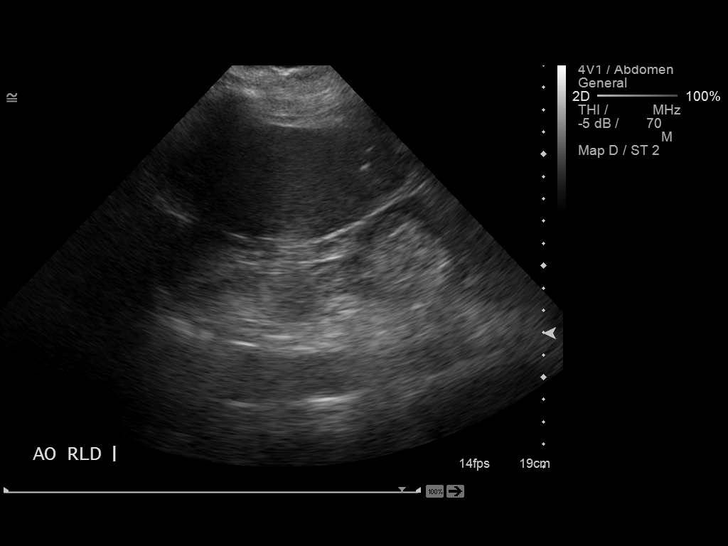

[13 of 25 positions shown; findings below may reference images not displayed]

FINDINGS: Gallbladder:  There are echogenic foci at the base of the
gallbladder with posterior acoustic shadowing.  Findings consistent
with multiple stones.  No evidence for gallbladder wall thickening.
The patient does not have a sonographic Murphy's sign.

Common bile duct:  Measures 0.7 cm.

Liver:  The liver is heterogeneous and hyperechoic.  Liver measures
up to an 19.7 cm in length.  Main portal vein is patent. There
appears to be a focal area of fatty sparing adjacent to the
gallbladder.

IVC:  Appears normal.

Pancreas:  No focal abnormality seen. Limited evaluation of
pancreatic tail.

Spleen:  Spleen measures 11.5 cm in length.

Right Kidney:  Right kidney measures 12.6 cm in length without
hydronephrosis.  Normal renal echotexture.

Left Kidney:  Left kidney measures 13.4 cm in length without
hydronephrosis.

Abdominal aorta:  No aneurysm identified.
IMPRESSION: Cholelithiasis.  Negative for sonographic Murphy's sign.

Common bile duct is slightly prominent.

The liver is diffusely echogenic and heterogeneous.  Findings are
compatible with hepatic steatosis.

## 2013-12-28 DIAGNOSIS — I10 Essential (primary) hypertension: Secondary | ICD-10-CM | POA: Diagnosis not present

## 2013-12-28 DIAGNOSIS — E039 Hypothyroidism, unspecified: Secondary | ICD-10-CM | POA: Diagnosis not present

## 2013-12-28 DIAGNOSIS — I4891 Unspecified atrial fibrillation: Secondary | ICD-10-CM | POA: Diagnosis not present

## 2013-12-28 DIAGNOSIS — E782 Mixed hyperlipidemia: Secondary | ICD-10-CM | POA: Diagnosis not present

## 2014-01-12 ENCOUNTER — Emergency Department (HOSPITAL_COMMUNITY): Payer: Medicare Other

## 2014-01-12 ENCOUNTER — Encounter (HOSPITAL_COMMUNITY): Payer: Self-pay | Admitting: Emergency Medicine

## 2014-01-12 ENCOUNTER — Inpatient Hospital Stay (HOSPITAL_COMMUNITY)
Admission: EM | Admit: 2014-01-12 | Discharge: 2014-01-14 | DRG: 193 | Disposition: A | Discharge status: Home / Self Care | Payer: Medicare Other | Attending: Internal Medicine | Admitting: Internal Medicine

## 2014-01-12 DIAGNOSIS — J45909 Unspecified asthma, uncomplicated: Secondary | ICD-10-CM | POA: Diagnosis present

## 2014-01-12 DIAGNOSIS — F329 Major depressive disorder, single episode, unspecified: Secondary | ICD-10-CM | POA: Diagnosis present

## 2014-01-12 DIAGNOSIS — Z833 Family history of diabetes mellitus: Secondary | ICD-10-CM | POA: Diagnosis not present

## 2014-01-12 DIAGNOSIS — J9600 Acute respiratory failure, unspecified whether with hypoxia or hypercapnia: Secondary | ICD-10-CM | POA: Diagnosis not present

## 2014-01-12 DIAGNOSIS — J438 Other emphysema: Secondary | ICD-10-CM | POA: Diagnosis present

## 2014-01-12 DIAGNOSIS — F411 Generalized anxiety disorder: Secondary | ICD-10-CM | POA: Diagnosis not present

## 2014-01-12 DIAGNOSIS — K7689 Other specified diseases of liver: Secondary | ICD-10-CM | POA: Diagnosis present

## 2014-01-12 DIAGNOSIS — I4891 Unspecified atrial fibrillation: Secondary | ICD-10-CM | POA: Diagnosis not present

## 2014-01-12 DIAGNOSIS — Z87891 Personal history of nicotine dependence: Secondary | ICD-10-CM

## 2014-01-12 DIAGNOSIS — J189 Pneumonia, unspecified organism: Secondary | ICD-10-CM | POA: Diagnosis not present

## 2014-01-12 DIAGNOSIS — E039 Hypothyroidism, unspecified: Secondary | ICD-10-CM | POA: Diagnosis present

## 2014-01-12 DIAGNOSIS — E785 Hyperlipidemia, unspecified: Secondary | ICD-10-CM | POA: Diagnosis not present

## 2014-01-12 DIAGNOSIS — Z79899 Other long term (current) drug therapy: Secondary | ICD-10-CM | POA: Diagnosis not present

## 2014-01-12 DIAGNOSIS — I5030 Unspecified diastolic (congestive) heart failure: Secondary | ICD-10-CM

## 2014-01-12 DIAGNOSIS — I5032 Chronic diastolic (congestive) heart failure: Secondary | ICD-10-CM | POA: Diagnosis not present

## 2014-01-12 DIAGNOSIS — M1990 Unspecified osteoarthritis, unspecified site: Secondary | ICD-10-CM | POA: Diagnosis present

## 2014-01-12 DIAGNOSIS — IMO0001 Reserved for inherently not codable concepts without codable children: Secondary | ICD-10-CM | POA: Diagnosis present

## 2014-01-12 DIAGNOSIS — Z6841 Body Mass Index (BMI) 40.0 and over, adult: Secondary | ICD-10-CM | POA: Diagnosis not present

## 2014-01-12 DIAGNOSIS — F3289 Other specified depressive episodes: Secondary | ICD-10-CM | POA: Diagnosis present

## 2014-01-12 DIAGNOSIS — K219 Gastro-esophageal reflux disease without esophagitis: Secondary | ICD-10-CM | POA: Diagnosis present

## 2014-01-12 DIAGNOSIS — I369 Nonrheumatic tricuspid valve disorder, unspecified: Secondary | ICD-10-CM | POA: Diagnosis not present

## 2014-01-12 DIAGNOSIS — I509 Heart failure, unspecified: Secondary | ICD-10-CM | POA: Diagnosis present

## 2014-01-12 DIAGNOSIS — G4733 Obstructive sleep apnea (adult) (pediatric): Secondary | ICD-10-CM | POA: Diagnosis present

## 2014-01-12 DIAGNOSIS — R0602 Shortness of breath: Secondary | ICD-10-CM | POA: Diagnosis not present

## 2014-01-12 DIAGNOSIS — J449 Chronic obstructive pulmonary disease, unspecified: Secondary | ICD-10-CM | POA: Diagnosis not present

## 2014-01-12 DIAGNOSIS — Z800 Family history of malignant neoplasm of digestive organs: Secondary | ICD-10-CM

## 2014-01-12 LAB — COMPREHENSIVE METABOLIC PANEL
ALBUMIN: 3.6 g/dL (ref 3.5–5.2)
ALK PHOS: 99 U/L (ref 39–117)
ALT: 18 U/L (ref 0–35)
AST: 19 U/L (ref 0–37)
BILIRUBIN TOTAL: 0.6 mg/dL (ref 0.3–1.2)
BUN: 12 mg/dL (ref 6–23)
CHLORIDE: 101 meq/L (ref 96–112)
CO2: 30 mEq/L (ref 19–32)
Calcium: 8.8 mg/dL (ref 8.4–10.5)
Creatinine, Ser: 0.8 mg/dL (ref 0.50–1.10)
GFR calc Af Amer: 84 mL/min — ABNORMAL LOW (ref >90)
GFR calc non Af Amer: 72 mL/min — ABNORMAL LOW (ref >90)
GLUCOSE: 110 mg/dL — AB (ref 70–99)
POTASSIUM: 4.7 meq/L (ref 3.7–5.3)
SODIUM: 141 meq/L (ref 137–147)
Total Protein: 7.4 g/dL (ref 6.0–8.3)

## 2014-01-12 LAB — INFLUENZA PANEL BY PCR (TYPE A & B)
H1N1 flu by pcr: NOT DETECTED
H1N1FLUPCR: NOT DETECTED
INFLBPCR: NEGATIVE
Influenza A By PCR: NEGATIVE
Influenza A By PCR: NEGATIVE
Influenza B By PCR: NEGATIVE

## 2014-01-12 LAB — CBC WITH DIFFERENTIAL/PLATELET
BASOS PCT: 0 % (ref 0–1)
Basophils Absolute: 0 10*3/uL (ref 0.0–0.1)
Eosinophils Absolute: 0.2 10*3/uL (ref 0.0–0.7)
Eosinophils Relative: 3 % (ref 0–5)
HCT: 42.6 % (ref 36.0–46.0)
HEMOGLOBIN: 13.6 g/dL (ref 12.0–15.0)
LYMPHS ABS: 0.9 10*3/uL (ref 0.7–4.0)
Lymphocytes Relative: 12 % (ref 12–46)
MCH: 28.8 pg (ref 26.0–34.0)
MCHC: 31.9 g/dL (ref 30.0–36.0)
MCV: 90.1 fL (ref 78.0–100.0)
MONOS PCT: 10 % (ref 3–12)
Monocytes Absolute: 0.7 10*3/uL (ref 0.1–1.0)
NEUTROS ABS: 5.3 10*3/uL (ref 1.7–7.7)
NEUTROS PCT: 75 % (ref 43–77)
Platelets: 220 10*3/uL (ref 150–400)
RBC: 4.73 MIL/uL (ref 3.87–5.11)
RDW: 13.4 % (ref 11.5–15.5)
WBC: 7.2 10*3/uL (ref 4.0–10.5)

## 2014-01-12 LAB — TROPONIN I

## 2014-01-12 LAB — HIV ANTIBODY (ROUTINE TESTING W REFLEX): HIV: NONREACTIVE

## 2014-01-12 LAB — PRO B NATRIURETIC PEPTIDE: Pro B Natriuretic peptide (BNP): 917.1 pg/mL — ABNORMAL HIGH (ref 0–125)

## 2014-01-12 MED ORDER — FLECAINIDE ACETATE 100 MG PO TABS
100.0000 mg | ORAL_TABLET | Freq: Two times a day (BID) | ORAL | Status: DC
  Administered 2014-01-12 – 2014-01-14 (×4): 100 mg via ORAL
  Filled 2014-01-12 (×6): qty 1

## 2014-01-12 MED ORDER — SODIUM CHLORIDE 0.9 % IJ SOLN
3.0000 mL | Freq: Two times a day (BID) | INTRAMUSCULAR | Status: DC
  Administered 2014-01-12 – 2014-01-13 (×2): 3 mL via INTRAVENOUS
  Administered 2014-01-14: 11:00:00 via INTRAVENOUS

## 2014-01-12 MED ORDER — DEXTROSE 5 % IV SOLN
1.0000 g | INTRAVENOUS | Status: DC
  Administered 2014-01-13 – 2014-01-14 (×2): 1 g via INTRAVENOUS
  Filled 2014-01-12 (×3): qty 10

## 2014-01-12 MED ORDER — TORSEMIDE 20 MG PO TABS: 20.0000 mg | ORAL_TABLET | Freq: Every day | ORAL | Status: DC | PRN

## 2014-01-12 MED ORDER — POTASSIUM CHLORIDE CRYS ER 20 MEQ PO TBCR: 20.0000 meq | EXTENDED_RELEASE_TABLET | Freq: Every day | ORAL | Status: DC | PRN

## 2014-01-12 MED ORDER — PANTOPRAZOLE SODIUM 40 MG PO TBEC
80.0000 mg | DELAYED_RELEASE_TABLET | Freq: Every day | ORAL | Status: DC
  Administered 2014-01-12 – 2014-01-14 (×3): 80 mg via ORAL
  Filled 2014-01-12 (×3): qty 2

## 2014-01-12 MED ORDER — CEFTRIAXONE SODIUM 1 G IJ SOLR
1.0000 g | Freq: Once | INTRAMUSCULAR | Status: AC
  Administered 2014-01-12: 1 g via INTRAVENOUS
  Filled 2014-01-12: qty 10

## 2014-01-12 MED ORDER — IPRATROPIUM-ALBUTEROL 0.5-2.5 (3) MG/3ML IN SOLN
3.0000 mL | RESPIRATORY_TRACT | Status: DC
  Administered 2014-01-12 (×3): 3 mL via RESPIRATORY_TRACT
  Filled 2014-01-12 (×3): qty 3

## 2014-01-12 MED ORDER — LEVOTHYROXINE SODIUM 50 MCG PO TABS
50.0000 ug | ORAL_TABLET | Freq: Every day | ORAL | Status: DC
  Administered 2014-01-13 – 2014-01-14 (×2): 50 ug via ORAL
  Filled 2014-01-12 (×2): qty 1

## 2014-01-12 MED ORDER — METHYLPREDNISOLONE SODIUM SUCC 125 MG IJ SOLR
60.0000 mg | Freq: Every day | INTRAMUSCULAR | Status: DC
  Administered 2014-01-12 – 2014-01-14 (×3): 60 mg via INTRAVENOUS
  Filled 2014-01-12 (×3): qty 2

## 2014-01-12 MED ORDER — AZITHROMYCIN 250 MG PO TABS
500.0000 mg | ORAL_TABLET | ORAL | Status: DC
  Administered 2014-01-13 – 2014-01-14 (×2): 500 mg via ORAL
  Filled 2014-01-12 (×2): qty 2

## 2014-01-12 MED ORDER — CLOTRIMAZOLE 1 % VA CREA
1.0000 | TOPICAL_CREAM | Freq: Every day | VAGINAL | Status: DC
  Administered 2014-01-12: 1 via VAGINAL
  Filled 2014-01-12: qty 45

## 2014-01-12 MED ORDER — APIXABAN 5 MG PO TABS
5.0000 mg | ORAL_TABLET | Freq: Two times a day (BID) | ORAL | Status: DC
  Administered 2014-01-12 – 2014-01-14 (×4): 5 mg via ORAL
  Filled 2014-01-12 (×4): qty 1

## 2014-01-12 MED ORDER — AZITHROMYCIN 500 MG IV SOLR
500.0000 mg | Freq: Once | INTRAVENOUS | Status: AC
  Administered 2014-01-12: 500 mg via INTRAVENOUS

## 2014-01-12 MED ORDER — LORAZEPAM 1 MG PO TABS
1.5000 mg | ORAL_TABLET | Freq: Three times a day (TID) | ORAL | Status: DC
  Administered 2014-01-12 – 2014-01-14 (×7): 1.5 mg via ORAL
  Filled 2014-01-12 (×14): qty 1

## 2014-01-12 MED ORDER — SODIUM CHLORIDE 0.9 % IV SOLN: 250.0000 mL | INTRAVENOUS | Status: DC | PRN

## 2014-01-12 MED ORDER — PROPRANOLOL HCL ER BEADS 80 MG PO CP24
80.0000 mg | ORAL_CAPSULE | Freq: Every morning | ORAL | Status: DC
  Administered 2014-01-13: 80 mg via ORAL
  Filled 2014-01-12 (×3): qty 1

## 2014-01-12 MED ORDER — IPRATROPIUM-ALBUTEROL 0.5-2.5 (3) MG/3ML IN SOLN
3.0000 mL | Freq: Once | RESPIRATORY_TRACT | Status: AC
  Administered 2014-01-12: 3 mL via RESPIRATORY_TRACT
  Filled 2014-01-12: qty 3

## 2014-01-12 MED ORDER — FLUOXETINE HCL 20 MG PO CAPS
60.0000 mg | ORAL_CAPSULE | Freq: Every morning | ORAL | Status: DC
  Administered 2014-01-13 – 2014-01-14 (×2): 60 mg via ORAL
  Filled 2014-01-12 (×2): qty 3

## 2014-01-12 MED ORDER — ALBUTEROL SULFATE (2.5 MG/3ML) 0.083% IN NEBU
2.5000 mg | INHALATION_SOLUTION | Freq: Once | RESPIRATORY_TRACT | Status: AC
  Administered 2014-01-12: 2.5 mg via RESPIRATORY_TRACT
  Filled 2014-01-12: qty 3

## 2014-01-12 MED ORDER — METHYLPREDNISOLONE SODIUM SUCC 125 MG IJ SOLR
125.0000 mg | Freq: Once | INTRAMUSCULAR | Status: AC
  Administered 2014-01-12: 125 mg via INTRAVENOUS
  Filled 2014-01-12: qty 2

## 2014-01-12 MED ORDER — SODIUM CHLORIDE 0.9 % IJ SOLN: 3.0000 mL | INTRAMUSCULAR | Status: DC | PRN

## 2014-01-12 NOTE — Progress Notes (Signed)
Utilization Review Complete  

## 2014-01-12 NOTE — Progress Notes (Addendum)
ANTIBIOTIC CONSULT NOTE - INITIAL  Pharmacy Consult for Renal Adjustment of Antibiotics Indication: rule out pneumonia  Allergies  Allergen Reactions  . Atorvastatin Other (See Comments)    Muscle sorness  . Demerol [Meperidine] Other (See Comments)    Bottoms BP.   . Meperidine Hcl Other (See Comments)    Drops BP  . Zocor [Simvastatin - High Dose] Other (See Comments)    Elevated liver enzymes   Patient Measurements: Height: 5\' 2"  (157.5 cm) Weight: 290 lb 2 oz (131.6 kg) IBW/kg (Calculated) : 50.1  Vital Signs: Temp: 98.4 F (36.9 C) (02/17 1323) Temp src: Oral (02/17 1323) BP: 121/67 mmHg (02/17 1323) Pulse Rate: 60 (02/17 1323) Intake/Output from previous day:   Intake/Output from this shift:    Labs:  Recent Labs  01/12/14 0936  WBC 7.2  HGB 13.6  PLT 220  CREATININE 0.80   Estimated Creatinine Clearance: 84.2 ml/min (by C-G formula based on Cr of 0.8). No results found for this basename: VANCOTROUGH, VANCOPEAK, VANCORANDOM, GENTTROUGH, GENTPEAK, GENTRANDOM, TOBRATROUGH, TOBRAPEAK, TOBRARND, AMIKACINPEAK, AMIKACINTROU, AMIKACIN,  in the last 72 hours   Microbiology: No results found for this or any previous visit (from the past 720 hour(s)).  Medical History: Past Medical History  Diagnosis Date  . GERD (gastroesophageal reflux disease)   . Fibromyalgia   . Anxiety   . Depression   . Obesity, morbid (more than 100 lbs over ideal weight or BMI > 40)   . Hyperlipemia   . Hypothyroidism   . Obstructive sleep apnea on CPAP and O2  . Migraine   . Colon polyp     TCS 2005 TICS [place], TCS/PROPOFOL 2011 SIMPLE ADENOMAS  . Allergic rhinitis   . Osteoarthritis   . Narcolepsy   . Cholecystitis   . Persistent atrial fibrillation     a. s/p DCCV 01/2012  . Diverticulosis 2005  . Internal hemorrhoids     H/o Rectal bleeding secondary to internal hemorrhoids 04/2010.  . Volume overload     uses torsemide PRN  . Fatty liver   . Gout   . Asthma     pt  states she "believe it is more allergies"   Medications:  Scheduled:  . apixaban  5 mg Oral BID  . [START ON 01/13/2014] azithromycin  500 mg Oral Q24H  . [START ON 01/13/2014] cefTRIAXone (ROCEPHIN)  IV  1 g Intravenous Q24H  . clotrimazole  1 Applicatorful Vaginal QHS  . flecainide  100 mg Oral BID  . [START ON 01/13/2014] FLUoxetine  60 mg Oral q morning - 10a  . ipratropium-albuterol  3 mL Nebulization Q4H  . [START ON 01/13/2014] levothyroxine  50 mcg Oral QAC breakfast  . LORazepam  1.5 mg Oral TID  . methylPREDNISolone (SOLU-MEDROL) injection  60 mg Intravenous Daily  . pantoprazole  80 mg Oral Q1200  . [START ON 01/13/2014] propranolol  80 mg Oral q morning - 10a  . sodium chloride  3 mL Intravenous Q12H   Assessment: 73yo female with h/o emphysema presents to ED c/o SOB and cough.  Pt started on Zithromax and Rocephin for suspected pneumonia.  No renal adjustment necessary for these and pt has good renal fxn.  Estimated Creatinine Clearance: 84.2 ml/min (by C-G formula based on Cr of 0.8).  Goal of Therapy:  Eradicate infection.  Plan:  Continue current Rx. Monitor progress and cultures  Hart Robinsons A 01/12/2014,2:33 PM

## 2014-01-12 NOTE — ED Notes (Signed)
Pt continues to have audible expiratory wheezing, but states that she is breathing better, update given to pt and family at bedside,

## 2014-01-12 NOTE — Progress Notes (Signed)
*  PRELIMINARY RESULTS* Echocardiogram 2D Echocardiogram has been performed.  [place], [place] 01/12/2014, 3:20 PM

## 2014-01-12 NOTE — ED Provider Notes (Addendum)
CSN: [id number]     Arrival date & time 01/12/14  0847 History   First MD Initiated Contact with Patient 01/12/14 [phone]     Chief Complaint  Patient presents with  . Cough  . Fever     (Consider location/radiation/quality/duration/timing/severity/associated sxs/prior Treatment) HPI Comments: Patient is 73 year old female with history of asthma, sleep apnea, obesity. She presents today with complaints of cough and congestion for one week which is moved into her chest over the past 3 days. She began with difficulty breathing yesterday evening and this has worsened 2 this morning. She reports a productive cough but denies fever or chills. She denies any chest pain, leg swelling.  Patient is a 73 y.o. female presenting with cough and fever. The history is provided by the patient.  Cough Cough characteristics:  Productive Sputum characteristics:  Green Severity:  Moderate Onset quality:  Gradual Duration:  10 days Timing:  Constant Progression:  Worsening Chronicity:  New Smoker: no   Relieved by:  Nothing Worsened by:  Nothing tried Ineffective treatments:  None tried Associated symptoms: fever, shortness of breath and sinus congestion   Associated symptoms: no chest pain   Fever Associated symptoms: cough   Associated symptoms: no chest pain     Past Medical History  Diagnosis Date  . GERD (gastroesophageal reflux disease)   . Fibromyalgia   . Anxiety   . Depression   . Obesity, morbid (more than 100 lbs over ideal weight or BMI > 40)   . Hyperlipemia   . Hypothyroidism   . Obstructive sleep apnea on CPAP and O2  . Migraine   . Colon polyp     TCS 2005 TICS [place], TCS/PROPOFOL 2011 SIMPLE ADENOMAS  . Allergic rhinitis   . Osteoarthritis   . Narcolepsy   . Cholecystitis   . Persistent atrial fibrillation     a. s/p DCCV 01/2012  . Diverticulosis 2005  . Internal hemorrhoids     H/o Rectal bleeding secondary to internal hemorrhoids 04/2010.  . Volume overload     uses  torsemide PRN  . Fatty liver   . Gout   . Asthma     pt states she "believe it is more allergies"   Past Surgical History  Procedure Laterality Date  . Abdominal hysterectomy  FIBROIDS 1982  . Rhinoplasty    . Bladder suspension  1982  . Kidney stent  1995  . Cardioversion  02/05/2012    Procedure: CARDIOVERSION;  Surgeon: Laverda Page, MD;  Location: [place];  Service: Cardiovascular;  Laterality: N/A;  . Cholecystectomy  09/03/2012    Procedure: LAPAROSCOPIC CHOLECYSTECTOMY;  Surgeon: Jamesetta So, MD;  Location: [facility];  Service: General;  Laterality: N/A;  . Colonoscopy  2005    Dr. Tamala Julian: sigmoid diverticulosis  . Colonoscopy  June 2011    Dr. Oneida Alar: internal hemorrhoids, simple adenomas, hyperplastic polyps, needs surveillance in June 2014 with overtube and Propofol  . Sphincterotomy  10/01/2012    Procedure: SPHINCTEROTOMY;  Surgeon: Daneil Dolin, MD;  Location: [facility];  Service: Endoscopy;  Laterality: N/A;  . Removal of stones  10/01/2012    Procedure: REMOVAL OF STONES;  Surgeon: Daneil Dolin, MD;  Location: [facility];  Service: Endoscopy;  Laterality: N/A;  . Ercp  10/01/2012    Procedure: ENDOSCOPIC RETROGRADE CHOLANGIOPANCREATOGRAPHY (ERCP);  Surgeon: Daneil Dolin, MD;  Location: [facility];  Service: Endoscopy;  Laterality: N/A;  . Cardioversion N/A 06/09/2013    Procedure: CARDIOVERSION;  Surgeon: Laverda Page, MD;  Location: [facility];  Service: Cardiovascular;  Laterality: N/A;  h&p in file-[facility]   Family History  Problem Relation Age of Onset  . Colon cancer Mother 20  . Diabetes Mother   . Cerebral aneurysm Father    History  Substance Use Topics  . Smoking status: Former Smoker -- 0.50 packs/day for 30 years    Quit date: 10/03/1989  . Smokeless tobacco: Never Used     Comment: 22 years ago  . Alcohol Use: No   OB History   Grav Para Term Preterm Abortions TAB SAB Ect Mult Living                 Review of Systems  Constitutional: Positive for  fever.  Respiratory: Positive for cough and shortness of breath.   Cardiovascular: Negative for chest pain.  All other systems reviewed and are negative.      Allergies  Atorvastatin; Demerol; Meperidine hcl; and Zocor  Home Medications   Current Outpatient Rx  Name  Route  Sig  Dispense  Refill  . acetaminophen (TYLENOL) 650 MG CR tablet   Oral   Take 650 mg by mouth every 8 (eight) hours as needed for pain.         Marland Kitchen albuterol (PROVENTIL,VENTOLIN) 90 MCG/ACT inhaler   Inhalation   Inhale 2 puffs into the lungs every 6 (six) hours as needed. For shortness of breath         . apixaban (ELIQUIS) 5 MG TABS tablet   Oral   Take 5 mg by mouth 2 (two) times daily.         . colchicine 0.6 MG tablet   Oral   Take 0.6 mg by mouth daily as needed. Gout Attack         . digoxin (LANOXIN) 0.25 MG tablet   Oral   Take 250 mcg by mouth every evening.          . dimethicone (PROSHIELD PLUS SKIN PROTECTANT) 1 % cream   Topical   Apply 1 application topically as needed. For itching/protection         . esomeprazole (NEXIUM) 40 MG capsule   Oral   Take 40 mg by mouth every evening.          . flecainide (TAMBOCOR) 100 MG tablet   Oral   Take 1 tablet (100 mg total) by mouth 2 (two) times daily.   60 tablet   3   . FLUoxetine (PROZAC) 20 MG capsule   Oral   Take 20 mg by mouth every morning.          . hydrocortisone cream 1 %   Topical   Apply 1 application topically as needed. For itching         . Levothyroxine Sodium (LEVOTHROID PO)   Oral   Take 0.5 mcg by mouth daily.         Marland Kitchen LORazepam (ATIVAN) 1 MG tablet   Oral   Take 1.5 mg by mouth 3 (three) times daily.         . miconazole (MONISTAT 7) 2 % vaginal cream   Apply externally   Apply 1 applicator topically at bedtime as needed. Yeast infection         . mometasone (NASONEX) 50 MCG/ACT nasal spray   Nasal   Place 1 spray into the nose at bedtime.         . potassium chloride  SA (KLOR-CON M20)  20 MEQ tablet   Oral   Take 20 mEq by mouth daily as needed (taken with torsemide). With Torsemide         . propranolol (INNOPRAN XL) 80 MG 24 hr capsule   Oral   Take 80 mg by mouth every morning.          . torsemide (DEMADEX) 20 MG tablet   Oral   Take 20 mg by mouth daily as needed (1-2 tab).          BP 120/57  Pulse 63  Temp(Src) 97.9 F (36.6 C) (Oral)  Resp 26  SpO2 97% Physical Exam  Nursing note and vitals reviewed. Constitutional: She is oriented to person, place, and time. She appears well-developed and well-nourished. No distress.  HENT:  Head: Normocephalic and atraumatic.  Mouth/Throat: Oropharynx is clear and moist.  Neck: Normal range of motion. Neck supple.  Cardiovascular: Normal rate, regular rhythm and normal heart sounds.   No murmur heard. Pulmonary/Chest: Effort normal. No respiratory distress. She has wheezes. She has no rales.  There are bilateral expiratory wheezes present.  Abdominal: Soft. Bowel sounds are normal. She exhibits no distension. There is no tenderness.  Musculoskeletal: Normal range of motion. She exhibits no edema.  Lymphadenopathy:    She has no cervical adenopathy.  Neurological: She is alert and oriented to person, place, and time.  Skin: Skin is warm and dry. She is not diaphoretic.    ED Course  Procedures (including critical care time) Labs Review Labs Reviewed  CBC WITH DIFFERENTIAL  COMPREHENSIVE METABOLIC PANEL  TROPONIN I  PRO B NATRIURETIC PEPTIDE   Imaging Review No results found.  EKG Interpretation    Date/Time:  Tuesday January 12 2014 08:54:01 EST Ventricular Rate:  63 PR Interval:  218 QRS Duration: 100 QT Interval:  444 QTC Calculation: 454 R Axis:   -76 Text Interpretation:  Sinus rhythm with 1st degree A-V block Pulmonary disease pattern Left anterior fascicular block Septal infarct (cited on or before 31-Aug-2012) Abnormal ECG When compared with ECG of 09-Jun-2013  13:29, Questionable change in initial forces of Anteroseptal leads Confirmed by DELOS  MD, Delrick Dehart ([id number]) on 01/12/2014 9:04:41 AM            MDM   Final diagnoses:  None     Here with shortness of breath and productive cough for several days. She is somewhat hypoxic on arrival with oxygen saturations in the low 90s. Her lungs are noted to have wheezing which did not improve much with steroids and nebulizer treatments. The remainder of the workup reveals an infiltrate in the right long. He has a mildly elevated BNP, however no evidence for CHF on the chest x-ray. Her troponin is negative and EKG is unchanged. She remains somewhat hypoxic and breath sounds remain rhonchorous. At this point I feel as though she should be admitted. I've spoken with the hospitalist who agrees to admit. [facility] will see the patient in the ED.   Veryl Speak, MD 01/12/14 1134  Veryl Speak, MD 01/12/14 [phone]

## 2014-01-12 NOTE — ED Notes (Signed)
Report given to floor,  

## 2014-01-12 NOTE — Progress Notes (Signed)
Called to get report on patient coming from ED. Nurse tied up.

## 2014-01-12 NOTE — ED Notes (Signed)
Dr. DeLo at bedside,  

## 2014-01-12 NOTE — H&P (Signed)
[facility] History and Physical  [name] [id number] DOB: 1941/10/23 DOA: 01/12/2014  Referring physician:  PCP: Maggie Font, MD  Specialists:   Chief Complaint: Shortness of breath and cough  HPI: [name] is a 73 y.o. female  With a history of obstructive sleep apnea, emphysema, atrial fibrillation presents emergency Department with complaints of shortness of breath and cough. Patient states that she had problems with her sinuses approximately one week ago had seen her primary care physician who did not say anything regarding this. She then states that she became of breath last night. She used nebulizer treatments which did help some with her shortness of breath. She also states she's been coughing for one day has had green sputum production. Patient also states she had a fever approximately 100.72F. She denies any sick contacts at home, denies any recent travel. Currently denies any chest pain abdominal pain problems with bowel movements or urination. Patient states that she is chronically short of breath and was worked up extensively as an outpatient. She denies any history of CHF. She also uses a CPAP machine at home. She does state that she receive her influenza as well as pneumonia vaccinations.  Review of Systems:  Constitutional: Complains of fever. Denies chills, diaphoresis, appetite change and fatigue.  HEENT: Denies photophobia, eye pain, redness, hearing loss, ear pain, congestion, sore throat, rhinorrhea, sneezing, mouth sores, trouble swallowing, neck pain, neck stiffness and tinnitus.   Respiratory: Complains of shortness of breath with cough and green sputum production.   Cardiovascular: Denies chest pain, palpitations and leg swelling.  Gastrointestinal: Denies nausea, vomiting, abdominal pain, diarrhea, constipation, blood in stool and abdominal distention.  Genitourinary: Denies dysuria, urgency, frequency, hematuria, flank pain and difficulty urinating.    Musculoskeletal: Denies myalgias, back pain, joint swelling, arthralgias and gait problem.  Skin: Denies pallor, rash and wound.  Neurological: Denies dizziness, seizures, syncope, weakness, light-headedness, numbness and headaches.  Hematological: Denies adenopathy. Easy bruising, personal or family bleeding history  Psychiatric/Behavioral: Denies suicidal ideation, mood changes, confusion, nervousness, sleep disturbance and agitation  Past Medical History  Diagnosis Date  . GERD (gastroesophageal reflux disease)   . Fibromyalgia   . Anxiety   . Depression   . Obesity, morbid (more than 100 lbs over ideal weight or BMI > 40)   . Hyperlipemia   . Hypothyroidism   . Obstructive sleep apnea on CPAP and O2  . Migraine   . Colon polyp     TCS 2005 TICS [place], TCS/PROPOFOL 2011 SIMPLE ADENOMAS  . Allergic rhinitis   . Osteoarthritis   . Narcolepsy   . Cholecystitis   . Persistent atrial fibrillation     a. s/p DCCV 01/2012  . Diverticulosis 2005  . Internal hemorrhoids     H/o Rectal bleeding secondary to internal hemorrhoids 04/2010.  . Volume overload     uses torsemide PRN  . Fatty liver   . Gout   . Asthma     pt states she "believe it is more allergies"   Past Surgical History  Procedure Laterality Date  . Abdominal hysterectomy  FIBROIDS 1982  . Rhinoplasty    . Bladder suspension  1982  . Kidney stent  1995  . Cardioversion  02/05/2012    Procedure: CARDIOVERSION;  Surgeon: Laverda Page, MD;  Location: [place];  Service: Cardiovascular;  Laterality: N/A;  . Cholecystectomy  09/03/2012    Procedure: LAPAROSCOPIC CHOLECYSTECTOMY;  Surgeon: Jamesetta So, MD;  Location: [facility];  Service:  General;  Laterality: N/A;  . Colonoscopy  2005    Dr. Tamala Julian: sigmoid diverticulosis  . Colonoscopy  June 2011    Dr. Oneida Alar: internal hemorrhoids, simple adenomas, hyperplastic polyps, needs surveillance in June 2014 with overtube and Propofol  . Sphincterotomy  10/01/2012     Procedure: SPHINCTEROTOMY;  Surgeon: Daneil Dolin, MD;  Location: [facility];  Service: Endoscopy;  Laterality: N/A;  . Removal of stones  10/01/2012    Procedure: REMOVAL OF STONES;  Surgeon: Daneil Dolin, MD;  Location: [facility];  Service: Endoscopy;  Laterality: N/A;  . Ercp  10/01/2012    Procedure: ENDOSCOPIC RETROGRADE CHOLANGIOPANCREATOGRAPHY (ERCP);  Surgeon: Daneil Dolin, MD;  Location: [facility];  Service: Endoscopy;  Laterality: N/A;  . Cardioversion N/A 06/09/2013    Procedure: CARDIOVERSION;  Surgeon: Laverda Page, MD;  Location: [facility] ENDOSCOPY;  Service: Cardiovascular;  Laterality: N/A;  h&p in file-[facility]   Social History:  reports that she quit smoking about 24 years ago. She has never used smokeless tobacco. She reports that she does not drink alcohol or use illicit drugs. Lives at home with family. Use a cane or walker as well as scooter for ambulation.  Allergies  Allergen Reactions  . Atorvastatin Other (See Comments)    Muscle sorness  . Demerol [Meperidine] Other (See Comments)    Bottoms BP.   . Meperidine Hcl Other (See Comments)    Drops BP  . Zocor [Simvastatin - High Dose] Other (See Comments)    Elevated liver enzymes    Family History  Problem Relation Age of Onset  . Colon cancer Mother 24  . Diabetes Mother   . Cerebral aneurysm Father      Prior to Admission medications   Medication Sig Start Date End Date Taking? Authorizing Provider  albuterol (PROVENTIL,VENTOLIN) 90 MCG/ACT inhaler Inhale 2 puffs into the lungs every 6 (six) hours as needed. For shortness of breath   Yes Historical Provider, MD  apixaban (ELIQUIS) 5 MG TABS tablet Take 5 mg by mouth 2 (two) times daily.   Yes Historical Provider, MD  colchicine 0.6 MG tablet Take 0.6 mg by mouth daily as needed. Gout Attack   Yes Historical Provider, MD  esomeprazole (NEXIUM) 40 MG capsule Take 40 mg by mouth every evening.    Yes Historical Provider, MD  flecainide (TAMBOCOR) 100 MG tablet Take 1  tablet (100 mg total) by mouth 2 (two) times daily. 04/17/13  Yes Thompson Grayer, MD  FLUoxetine (PROZAC) 20 MG capsule Take 60 mg by mouth every morning.    Yes Historical Provider, MD  hydrocortisone cream 1 % Apply 1 application topically as needed. For itching   Yes Historical Provider, MD  levothyroxine (SYNTHROID, LEVOTHROID) 50 MCG tablet Take 50 mcg by mouth daily before breakfast.   Yes Historical Provider, MD  LORazepam (ATIVAN) 1 MG tablet Take 1.5 mg by mouth 3 (three) times daily.   Yes Historical Provider, MD  miconazole (MONISTAT 7) 2 % vaginal cream Apply 1 applicator topically at bedtime as needed. Yeast infection   Yes Historical Provider, MD  potassium chloride SA (KLOR-CON M20) 20 MEQ tablet Take 20 mEq by mouth daily as needed (taken with torsemide). With Torsemide   Yes Historical Provider, MD  propranolol (INNOPRAN XL) 80 MG 24 hr capsule Take 80 mg by mouth every morning.    Yes Historical Provider, MD  torsemide (DEMADEX) 20 MG tablet Take 20 mg by mouth daily as needed (1-2 tab).  Yes Historical Provider, MD   Physical Exam: Filed Vitals:   01/12/14 1053  BP: 121/63  Pulse: 61  Temp:   Resp: 18     General: Well developed, well nourished, NAD, appears stated age  HEENT: NCAT, PERRLA, EOMI, Anicteic Sclera, mucous membranes moist.  Neck: Supple, no JVD, no masses  Cardiovascular: S1 S2 auscultated, Regular rate and rhythm.  Respiratory: Diffuse wheezing noted throughout all lung fields.   Abdomen: Soft, obese, nontender, nondistended, + bowel sounds  Extremities: warm dry without cyanosis clubbing or edema  Neuro: AAOx3, cranial nerves grossly intact. Strength 5/5 in patient's upper and lower extremities bilaterally  Skin: Without rashes exudates or nodules  Psych: Normal affect and demeanor with intact judgement and insight  Labs on Admission:  Basic Metabolic Panel:  Recent Labs Lab 01/12/14 0936  NA 141  K 4.7  CL 101  CO2 30  GLUCOSE 110*   BUN 12  CREATININE 0.80  CALCIUM 8.8   Liver Function Tests:  Recent Labs Lab 01/12/14 0936  AST 19  ALT 18  ALKPHOS 99  BILITOT 0.6  PROT 7.4  ALBUMIN 3.6   No results found for this basename: LIPASE, AMYLASE,  in the last 168 hours No results found for this basename: AMMONIA,  in the last 168 hours CBC:  Recent Labs Lab 01/12/14 0936  WBC 7.2  NEUTROABS 5.3  HGB 13.6  HCT 42.6  MCV 90.1  PLT 220   Cardiac Enzymes:  Recent Labs Lab 01/12/14 0936  TROPONINI <0.30    BNP (last 3 results)  Recent Labs  01/12/14 0936  PROBNP 917.1*   CBG: No results found for this basename: GLUCAP,  in the last 168 hours  Radiological Exams on Admission: Dg Chest Portable 1 View  01/12/2014   CLINICAL DATA:  Cough and fever.  EXAM: PORTABLE CHEST - 1 VIEW  COMPARISON:  PA and lateral chest x-ray dated January 02, 2013.  FINDINGS: The lungs are mildly hyperinflated. The cardiac silhouette is top-normal in size. The pulmonary vascularity is within the limits of normal. The interstitial markings are increased diffusely and there are coarse lung markings in the right infrahilar region consistent with atelectasis or developing pneumonia. No pleural effusion is evident.  IMPRESSION: The findings are consistent with atelectasis or infiltrate in the right infrahilar region. There are changes of underlying COPD.   Electronically Signed   By: David  Martinique   On: 01/12/2014 09:13    EKG: Independently reviewed. Sinus rhythm, rate 63, first degree AV block, PR 218  Assessment/Plan  Acute hypoxic respiratory failure secondary to community-acquired pneumonia Patient be admitted to medical floor. She was found to be hypoxic on room air with saturations of 89%. Chest x-ray shows findings are consistent with atelectasis or infiltrate in the right infrahilar region, there are changes of underlying COPD. Patient denies history of COPD. Patient be placed on azithromycin and ceftriaxone  empirically. Will also obtain a.m. to onset PCR. Will place patient on supplemental oxygen to maintain her oxygen sats above 92%. Also place on DuoNeb treatments, Solu-Medrol. Will obtain a sputum Gram stain and culture along with blood cultures.  History of Atrial fibrillation Patient currently in sinus rhythm.  She has undergone cardioversion in the past. We will continue Eliquis, flecainide, propranolol.    Obstructive sleep apnea We'll continue patient's CPAP as well as supplemental oxygen at night. Patient does use home oxygen with her CPAP.  Depression/anxiety Continue lorazepam and Prozac  GERD Continue PPI  Edema  Patient denies a history of CHF. However we will continue her on furosemide. Last echocardiogram February 2013 shows an EF of 58% normal systolic global function, mild pulmonary hypertension. BNP 917.  May consider repeat echocardiogram.    Hypothyroidism Continue levothyroxine.  DVT prophylaxis: Eliquis  Code Status: Full  Condition: Guarded  Family Communication: Family at bedside. Admission, patients condition and plan of care including tests being ordered have been discussed with the patient and family who indicate understanding and agree with the plan and Code Status.  Disposition Plan: Admitted   Time spent: 60 minutes  Ellanor Feuerstein D.O. [facility] Pager [phone]  If 7PM-7AM, please contact night-coverage [email or web address] Password [email or web address] 01/12/2014, 12:02 PM

## 2014-01-12 NOTE — ED Notes (Signed)
Lab in to draw blood cultures

## 2014-01-12 NOTE — ED Notes (Signed)
Dr Ree Kida notified that rocephin had already been started prior to blood cultures being ordered, advised to go ahead and draw cultures,

## 2014-01-12 NOTE — ED Notes (Signed)
Pt c/o productive cough and fever x 3days.   Reports SOB.  Reports head congestion x 1 week.

## 2014-01-12 NOTE — ED Notes (Signed)
Pt states that she began to experience nasal and head congestion a week ago, started having a cough and fever 3 days ago, sob became worse this am,

## 2014-01-13 DIAGNOSIS — I4891 Unspecified atrial fibrillation: Secondary | ICD-10-CM

## 2014-01-13 DIAGNOSIS — G4733 Obstructive sleep apnea (adult) (pediatric): Secondary | ICD-10-CM | POA: Diagnosis not present

## 2014-01-13 DIAGNOSIS — J9600 Acute respiratory failure, unspecified whether with hypoxia or hypercapnia: Secondary | ICD-10-CM | POA: Diagnosis present

## 2014-01-13 DIAGNOSIS — J189 Pneumonia, unspecified organism: Secondary | ICD-10-CM | POA: Diagnosis not present

## 2014-01-13 LAB — CBC
HEMATOCRIT: 41.5 % (ref 36.0–46.0)
HEMOGLOBIN: 13.6 g/dL (ref 12.0–15.0)
MCH: 29.2 pg (ref 26.0–34.0)
MCHC: 32.8 g/dL (ref 30.0–36.0)
MCV: 89.1 fL (ref 78.0–100.0)
Platelets: 248 10*3/uL (ref 150–400)
RBC: 4.66 MIL/uL (ref 3.87–5.11)
RDW: 13.2 % (ref 11.5–15.5)
WBC: 8.9 10*3/uL (ref 4.0–10.5)

## 2014-01-13 LAB — BASIC METABOLIC PANEL
BUN: 23 mg/dL (ref 6–23)
CHLORIDE: 101 meq/L (ref 96–112)
CO2: 26 mEq/L (ref 19–32)
CREATININE: 0.93 mg/dL (ref 0.50–1.10)
Calcium: 9.3 mg/dL (ref 8.4–10.5)
GFR calc non Af Amer: 60 mL/min — ABNORMAL LOW (ref >90)
GFR, EST AFRICAN AMERICAN: 70 mL/min — AB (ref >90)
GLUCOSE: 127 mg/dL — AB (ref 70–99)
Potassium: 4 mEq/L (ref 3.7–5.3)
Sodium: 141 mEq/L (ref 137–147)

## 2014-01-13 LAB — LEGIONELLA ANTIGEN, URINE: Legionella Antigen, Urine: NEGATIVE

## 2014-01-13 LAB — STREP PNEUMONIAE URINARY ANTIGEN: Strep Pneumo Urinary Antigen: NEGATIVE

## 2014-01-13 MED ORDER — IPRATROPIUM-ALBUTEROL 0.5-2.5 (3) MG/3ML IN SOLN
3.0000 mL | RESPIRATORY_TRACT | Status: DC
  Administered 2014-01-13 – 2014-01-14 (×7): 3 mL via RESPIRATORY_TRACT
  Filled 2014-01-13 (×7): qty 3

## 2014-01-13 MED ORDER — ACETAMINOPHEN 325 MG PO TABS: 650.0000 mg | ORAL_TABLET | Freq: Four times a day (QID) | ORAL | Status: DC | PRN

## 2014-01-13 NOTE — Plan of Care (Signed)
Problem: Food- and Nutrition-Related Knowledge Deficit (NB-1.1) Goal: Nutrition education Formal process to instruct or train a patient/client in a skill or to impart knowledge to help patients/clients voluntarily manage or modify food choices and eating behavior to maintain or improve health. Outcome: Completed/Met Date Met:  01/13/14  RD consulted for nutrition education regarding weight loss.  Pt reports strong desire for weight loss, but admits to poor eating habits in the past. She reports she used to be very active (walking 6 miles daily and playing tennis), but always had poor eating habits, often only eating one meal per day. She has recently started changing her eating habits with the help of her daughter, by consuming low fat dairy, lean meats, and more vegetables. She reports difficulty preparing meals herself, due to physical limitations. Her daughter is very involved in her care and prepares meals for her at least once per week.   She reports her barriers are portion control and limited physical activity. She states "If I sit in this hospital, I will lose weight because I eat only what they give me".   Body mass index is 53.05 kg/(m^2). Pt meets criteria for extreme obesity, class III based on current BMI.  RD provided "Weight Loss Tips" handout from the Academy of [employer]. Emphasized the importance of serving sizes and provided examples of correct portions of common foods. Discussed importance of controlled and consistent intake throughout the day. Provided examples of ways to balance meals/snacks and encouraged intake of high-fiber, whole grain complex carbohydrates. Emphasized the importance of hydration with calorie-free beverages and limiting sugar-sweetened beverages. Encouraged pt to discuss physical activity options with physician. Teach back method used.  Expect good compliance.  Current diet order is Heart Healthy, patient is consuming approximately 100% of  meals at this time. Labs and medications reviewed. No further nutrition interventions warranted at this time. RD contact information provided. If additional nutrition issues arise, please re-consult RD.  Leanor Voris A. Jimmye Norman, RD, LDN Pager: [phone]

## 2014-01-13 NOTE — Progress Notes (Addendum)
PROGRESS NOTE    [name] WVP:[id number] DOB: [birth year] DOA: 01/12/2014 PCP: Maggie Font, MD Primary Cardiologist: Dr. Kela Millin  HPI/Brief narrative 73 yo woman with hx OSA on nightly CPAP, morbid obesity, emphysema, afib, presented to the ED on 01/12/14 with complaints of difficulty breathing, cough, low-grade fevers but denied chest pains. She was assessed to have acute respiratory failure secondary to community-acquired pneumonia.  Assessment/Plan:  Acute hypoxic respiratory failure secondary to community-acquired pneumonia  Somewhat improved this am. Suspect there is chronic component of respiratory failure as well.  She was found to be hypoxic on room air with saturations of 89%. Chest x-ray shows findings are consistent with atelectasis or infiltrate in the right infrahilar region, there are changes of underlying COPD. Strep pneumonia urine antigen negative. Legionella antigen in process. Influenza PCR negative.  Currently oxygen saturation level 93-95% on 2L.  Continue azithromycin and ceftriaxone day #2-consider transitioning to oral antibiotics in a.m.Marland Kitchen  Continue supplemental oxygen to maintain her oxygen sats above 92%. Continue DuoNeb treatments, Solu-Medrol. Await sputum Gram stain and culture along with blood cultures. She may need home oxygen at discharge.  History of Atrial fibrillation  Patient remains in sinus rhythm. She has undergone cardioversion in the past. We will continue Eliquis, flecainide, propranolol. EKG on 01/12/14 with QTC 454 ms. Follow EKG in a.m.   Obstructive sleep apnea  We'll continue patient's CPAP as well as supplemental oxygen at night. Patient does use home oxygen with her CPAP.   Depression/anxiety   Appears stable at baseline. Continue lorazepam and Prozac   GERD  Continue PPI   Edema  Patient denies a history of CHF.  Last echocardiogram February 2013 shows an EF of 54% normal systolic global function, mild pulmonary hypertension.  Repeat echo revealed EF 55-60% with features consistent with grade 2 diastolic dysfunction.  BNP 917. Continue lasix. Monitor intake and output. Monitor daily weight.   Hypothyroidism  Continue levothyroxine.   Obesity BMI 53.2. Will request nutritional consult    Code Status: Full Family Communication: Discussed with daughter at bedside Disposition Plan: home hopefully tomorrow.    Consultants:  None  Procedures: 2 D-echo 2/18 The study was technically difficult, as a result of poor sound wave transmission and body habitus. - Left ventricle: The cavity size was mildly dilated. Wall thickness was increased in a pattern of mild LVH. Systolic function was normal. The estimated ejection fraction was in the range of 55% to 60%. Images were inadequate for LV wall motion assessment. Features are consistent with a pseudonormal left ventricular filling pattern, with concomitant abnormal relaxation and increased filling pressure (grade 2 diastolic dysfunction). Doppler parameters are consistent with high ventricular filling pressure.   Antibiotics:  Rocephin 01/12/14>>>  Azithromycin 01/12/14>>   Subjective: Overall feels much better. Minimal cough. Dyspnea improving but not at baseline. No chest pains or fevers.   Objective: Filed Vitals:   01/13/14 1544 01/13/14 1707 01/13/14 1708 01/13/14 1718  BP:      Pulse:      Temp:      TempSrc:      Resp:      Height: 5\' 2"  (1.575 m)     Weight: 133.993 kg (295 lb 6.4 oz)     SpO2:  92% 84% 95%   temperature 90.21F, pulse 54 per minute, respiration 21 per minute, blood pressure 100/58 mmHg  Intake/Output Summary (Last 24 hours) at 01/13/14 1837 Last data filed at 01/13/14 1824  Gross per 24 hour  Intake  1250 ml  Output    300 ml  Net    950 ml   Filed Weights   01/12/14 1323 01/13/14 1544  Weight: 131.6 kg (290 lb 2 oz) 133.993 kg (295 lb 6.4 oz)     Exam:  General exam: obese appears comfortable Respiratory  system: Diminished breath sounds in the bases but otherwise clear to auscultation without wheezing, rhonchi or crackles. No increased work of breathing. Able to speak in full sentences. Cardiovascular system: S1 & S2 heard, RRR. No JVD, murmurs, gallops, clicks. Trace LE edema. Telemetry: Sinus bradycardia in the 50s. Gastrointestinal system: Abdomen is nondistended, soft and nontender. Normal bowel sounds heard. Central nervous system: Alert and oriented. No focal neurological deficits. Extremities: Symmetric 5 x 5 power.   Data Reviewed: Basic Metabolic Panel:  Recent Labs Lab 01/12/14 0936 01/13/14 0530  NA 141 141  K 4.7 4.0  CL 101 101  CO2 30 26  GLUCOSE 110* 127*  BUN 12 23  CREATININE 0.80 0.93  CALCIUM 8.8 9.3   Liver Function Tests:  Recent Labs Lab 01/12/14 0936  AST 19  ALT 18  ALKPHOS 99  BILITOT 0.6  PROT 7.4  ALBUMIN 3.6   No results found for this basename: LIPASE, AMYLASE,  in the last 168 hours No results found for this basename: AMMONIA,  in the last 168 hours CBC:  Recent Labs Lab 01/12/14 0936 01/13/14 0530  WBC 7.2 8.9  NEUTROABS 5.3  --   HGB 13.6 13.6  HCT 42.6 41.5  MCV 90.1 89.1  PLT 220 248   Cardiac Enzymes:  Recent Labs Lab 01/12/14 0936  TROPONINI <0.30   BNP (last 3 results)  Recent Labs  01/12/14 0936  PROBNP 917.1*   CBG: No results found for this basename: GLUCAP,  in the last 168 hours  Recent Results (from the past 240 hour(s))  CULTURE, BLOOD (ROUTINE X 2)     Status: None   Collection Time    01/12/14 12:49 PM      Result Value Ref Range Status   Specimen Description BLOOD RIGHT ARM   Final   Special Requests BOTTLES DRAWN AEROBIC AND ANAEROBIC 6CC   Final   Culture NO GROWTH 1 DAY   Final   Report Status PENDING   Incomplete  CULTURE, BLOOD (ROUTINE X 2)     Status: None   Collection Time    01/12/14 12:49 PM      Result Value Ref Range Status   Specimen Description BLOOD RIGHT ANTECUBITAL   Final     Special Requests BOTTLES DRAWN AEROBIC AND ANAEROBIC 6CC   Final   Culture NO GROWTH 1 DAY   Final   Report Status PENDING   Incomplete     Studies: Dg Chest Portable 1 View  01/12/2014   CLINICAL DATA:  Cough and fever.  EXAM: PORTABLE CHEST - 1 VIEW  COMPARISON:  PA and lateral chest x-ray dated January 02, 2013.  FINDINGS: The lungs are mildly hyperinflated. The cardiac silhouette is top-normal in size. The pulmonary vascularity is within the limits of normal. The interstitial markings are increased diffusely and there are coarse lung markings in the right infrahilar region consistent with atelectasis or developing pneumonia. No pleural effusion is evident.  IMPRESSION: The findings are consistent with atelectasis or infiltrate in the right infrahilar region. There are changes of underlying COPD.   Electronically Signed   By: David  Martinique   On: 01/12/2014 09:13  Scheduled Meds: . apixaban  5 mg Oral BID  . azithromycin  500 mg Oral Q24H  . cefTRIAXone (ROCEPHIN)  IV  1 g Intravenous Q24H  . clotrimazole  1 Applicatorful Vaginal QHS  . flecainide  100 mg Oral BID  . FLUoxetine  60 mg Oral q morning - 10a  . ipratropium-albuterol  3 mL Nebulization Q4H WA  . levothyroxine  50 mcg Oral QAC breakfast  . LORazepam  1.5 mg Oral TID  . methylPREDNISolone (SOLU-MEDROL) injection  60 mg Intravenous Daily  . pantoprazole  80 mg Oral Q1200  . propranolol  80 mg Oral q morning - 10a  . sodium chloride  3 mL Intravenous Q12H   Continuous Infusions:   Active Problems:   HYPOTHYROIDISM   HYPERLIPIDEMIA   OBESITY, MORBID   ANXIETY   DEPRESSION   OBSTRUCTIVE SLEEP APNEA   GERD   DYSPNEA ON EXERTION   Atrial fibrillation   CAP (community acquired pneumonia)   Acute respiratory failure    Time spent: 99 minutes    Korah Hufstedler, MD, FACP, FHM. [facility] Pager [phone]  If 7PM-7AM, please contact night-coverage [email or web address] Password [facility] 01/13/2014, 6:37 PM     LOS: 1 day

## 2014-01-13 NOTE — Progress Notes (Signed)
Patient's heart rate 53-54 on telemetry.  Patient has Propranolol ordered.  Dr. Algis Liming on unit and notified.  Dr. Algis Liming stated to hold Propranolol if heart rate is consistently less than 55.  If heart rate is consistently greater than 55, administer the Propranolol.

## 2014-01-14 DIAGNOSIS — G4733 Obstructive sleep apnea (adult) (pediatric): Secondary | ICD-10-CM | POA: Diagnosis not present

## 2014-01-14 DIAGNOSIS — I5032 Chronic diastolic (congestive) heart failure: Secondary | ICD-10-CM | POA: Diagnosis not present

## 2014-01-14 DIAGNOSIS — J189 Pneumonia, unspecified organism: Secondary | ICD-10-CM | POA: Diagnosis not present

## 2014-01-14 DIAGNOSIS — I4891 Unspecified atrial fibrillation: Secondary | ICD-10-CM | POA: Diagnosis not present

## 2014-01-14 DIAGNOSIS — J9600 Acute respiratory failure, unspecified whether with hypoxia or hypercapnia: Secondary | ICD-10-CM | POA: Diagnosis not present

## 2014-01-14 DIAGNOSIS — Z6841 Body Mass Index (BMI) 40.0 and over, adult: Secondary | ICD-10-CM | POA: Diagnosis not present

## 2014-01-14 MED ORDER — PROPRANOLOL HCL ER 80 MG PO CP24
80.0000 mg | ORAL_CAPSULE | Freq: Every day | ORAL | Status: DC
  Administered 2014-01-14: 80 mg via ORAL
  Filled 2014-01-14 (×2): qty 1

## 2014-01-14 MED ORDER — AZITHROMYCIN 250 MG PO TABS: ORAL_TABLET | ORAL | Status: DC

## 2014-01-14 MED ORDER — PREDNISONE 10 MG PO TABS: ORAL_TABLET | ORAL | Status: DC

## 2014-01-14 MED ORDER — LIVING BETTER WITH HEART FAILURE BOOK
Freq: Once | Status: AC
  Administered 2014-01-14: 1
  Filled 2014-01-14: qty 1

## 2014-01-14 NOTE — Progress Notes (Signed)
Patient's heart rate 50s this morning.  Heart rate this afternoon 53-60.  Dr. Algis Liming notified.  Stated to administer the Propranolol since this is patient's normal range.

## 2014-01-14 NOTE — Progress Notes (Signed)
Late entry 01/13/2014:  Patient's O2 sats at rest on RA 92%.  Patient's O2 sats while ambulating on RA 84%.  O2 at 2L [place] reapplied and O2 sats 95%.

## 2014-01-14 NOTE — Progress Notes (Signed)
Patient's O2 sats at rest 94% on RA.  Patient ambulated on RA 89%-94%.  Patient walked to nurse's station.  Stated felt a little "lightheaded" on the way back to room.  BP 134/78, Pulse 63, O2 sats 94% RA.  Dyanne Carrel, NP notified.

## 2014-01-14 NOTE — Discharge Summary (Signed)
Physician Discharge Summary  [name] HAL:[id number] DOB: 04/01/1941 DOA: 01/12/2014  PCP: Maggie Font, MD  Admit date: 01/12/2014 Discharge date: 01/14/2014  Time spent: 45 minutes  Recommendations for Outpatient Follow-up:  1. PCP 1 week for evaluation of symptoms. Recommend evaluation of respiratory effort and oxygen saturation level and need for continued oxygen support 24/7.  Recommend monitoring of BP control.   Discharge Diagnoses:  Active Problems:   HYPOTHYROIDISM   HYPERLIPIDEMIA   OBESITY, MORBID   ANXIETY   DEPRESSION   OBSTRUCTIVE SLEEP APNEA   GERD   DYSPNEA ON EXERTION   Atrial fibrillation   CAP (community acquired pneumonia)   Acute respiratory failure   Discharge Condition: stable  Diet recommendation: heart healthy  Filed Weights   01/12/14 1323 01/13/14 1544 01/14/14 0534  Weight: 131.6 kg (290 lb 2 oz) 133.993 kg (295 lb 6.4 oz) 133.811 kg (295 lb)    History of present illness:  [name] is a 73 y.o. female  With a history of obstructive sleep apnea, emphysema, atrial fibrillation presented to emergency Department on 01/12/14 with complaints of shortness of breath and cough. Patient stated that she had problems with her sinuses approximately one week prior had seen her primary care physician who did not say anything regarding this. She then stated that she became short of breath the night prior to presentation. She used nebulizer treatments which did help some with her shortness of breath. She also stated she'd been coughing for one day  had green sputum production. Patient also stated she had a fever approximately 100.61F. She denied any sick contacts at home, denied any recent travel. Denied any chest pain abdominal pain problems with bowel movements or urination. Patient stated that she is chronically short of breath and was worked up extensively as an outpatient. She denied any history of CHF. She also uses a CPAP machine at home. She did state that  she receive her influenza as well as pneumonia vaccinations.   Hospital Course:  Acute hypoxic respiratory failure secondary to community-acquired pneumonia  Suspect there is chronic component of respiratory failure as well. She was found to be hypoxic on room air with saturations of 89%. Chest x-ray showed findings consistent with atelectasis or infiltrate in the right infrahilar region, there are changes of underlying COPD. Strep pneumonia urine antigen negative. Legionella antigen negative. Influenza PCR negative. Admitted to floor and given solumedrol, nebs and started on azithromycin and rocephin. She quickly improved. At discharge oxygen saturation level 92% on room air at rest. With ambulation sats dropped to 84%. Ambulating with oxygen and sats maintained 95%.  She will be discharged with oxygen continuously. She will continue nebs at home and will be provided with prednisone taper and azithromycin to complete 7 day course.     History of Atrial fibrillation  Patient remained in sinus rhythm. She had undergone cardioversion in the past. Continue Eliquis, flecainide, propranolol. EKG on 01/12/14 with QTC 454 ms.  Obstructive sleep apnea  Provided with  CPAP as well as supplemental oxygen at night.   Depression/anxiety  remained stable at baseline. Continue lorazepam and Prozac  GERD  Continue PPI  Edema  Patient denied a history of CHF. Last echocardiogram February 2013 shows an EF of 73% normal systolic global function, mild pulmonary hypertension. Repeat echo revealed EF 55-60% with features consistent with grade 2 diastolic dysfunction. BNP 917. Continue home dmadex.  Hypothyroidism  Continue levothyroxine.  Obesity  BMI 53.2. nutritional consult 01/13/14  Procedures:  2-decho on 01/12/14 The study was technically difficult, as a result of poor sound wave transmission and body habitus. - Left ventricle: The cavity size was mildly dilated. Wall thickness was increased in a  pattern of mild LVH. Systolic function was normal. The estimated ejection fraction was in the range of 55% to 60%. Images were inadequate for LV wall motion assessment. Features are consistent with a pseudonormal left ventricular filling pattern, with concomitant abnormal relaxation and increased filling pressure (grade 2 diastolic dysfunction). Doppler parameters are consistent with high ventricular filling pressure.   Consultations:  none  Discharge Exam: Filed Vitals:   01/14/14 1257  BP: 134/78  Pulse: 63  Temp:   Resp:     General: obese appears comfortable Cardiovascular: S1 and S2, RRR no m/g/r. Trace LE edema Respiratory: improved air flow but remain somewhat distant. Normal effort. No wheeze or rhonchi or crackles   Discharge Instructions     Medication List         albuterol 90 MCG/ACT inhaler  Commonly known as:  PROVENTIL,VENTOLIN  Inhale 2 puffs into the lungs every 6 (six) hours as needed. For shortness of breath     apixaban 5 MG Tabs tablet  Commonly known as:  ELIQUIS  Take 5 mg by mouth 2 (two) times daily.     azithromycin 250 MG tablet  Commonly known as:  ZITHROMAX  Take 2 tabs daily for 5 days starting 01/15/14     colchicine 0.6 MG tablet  Take 0.6 mg by mouth daily as needed. Gout Attack     esomeprazole 40 MG capsule  Commonly known as:  NEXIUM  Take 40 mg by mouth every evening.     flecainide 100 MG tablet  Commonly known as:  TAMBOCOR  Take 1 tablet (100 mg total) by mouth 2 (two) times daily.     FLUoxetine 20 MG capsule  Commonly known as:  PROZAC  Take 60 mg by mouth every morning.     hydrocortisone cream 1 %  Apply 1 application topically as needed. For itching     KLOR-CON M20 20 MEQ tablet  Generic drug:  potassium chloride SA  Take 20 mEq by mouth daily as needed (taken with torsemide). With Torsemide     levothyroxine 50 MCG tablet  Commonly known as:  SYNTHROID, LEVOTHROID  Take 50 mcg by mouth daily before  breakfast.     LORazepam 1 MG tablet  Commonly known as:  ATIVAN  Take 1.5 mg by mouth 3 (three) times daily.     miconazole 2 % vaginal cream  Commonly known as:  MONISTAT 7  Apply 1 applicator topically at bedtime as needed. Yeast infection     predniSONE 10 MG tablet  Commonly known as:  DELTASONE  Take 4 tabs for 3 days starting 01/15/14, then take 2 tabs for 3 days then take 1 tab for 3 days then stop.     propranolol 80 MG 24 hr capsule  Commonly known as:  INNOPRAN XL  Take 80 mg by mouth every morning.     torsemide 20 MG tablet  Commonly known as:  DEMADEX  Take 20 mg by mouth daily as needed (1-2 tab).       Allergies  Allergen Reactions  . Atorvastatin Other (See Comments)    Muscle sorness  . Demerol [Meperidine] Other (See Comments)    Bottoms BP.   . Meperidine Hcl Other (See Comments)    Drops BP  . Zocor [  Simvastatin - High Dose] Other (See Comments)    Elevated liver enzymes       Follow-up Information   Follow up with HILL,GERALD K, MD. Schedule an appointment as soon as possible for a visit in 1 week. (for evaluation of symptoms)    Specialty:  Family Medicine   Contact information:   [address] [place]  [id number] [phone]        The results of significant diagnostics from this hospitalization (including imaging, microbiology, ancillary and laboratory) are listed below for reference.    Significant Diagnostic Studies: Dg Chest Portable 1 View  01/12/2014   CLINICAL DATA:  Cough and fever.  EXAM: PORTABLE CHEST - 1 VIEW  COMPARISON:  PA and lateral chest x-ray dated January 02, 2013.  FINDINGS: The lungs are mildly hyperinflated. The cardiac silhouette is top-normal in size. The pulmonary vascularity is within the limits of normal. The interstitial markings are increased diffusely and there are coarse lung markings in the right infrahilar region consistent with atelectasis or developing pneumonia. No pleural effusion is  evident.  IMPRESSION: The findings are consistent with atelectasis or infiltrate in the right infrahilar region. There are changes of underlying COPD.   Electronically Signed   By: David  Martinique   On: 01/12/2014 09:13    Microbiology: Recent Results (from the past 240 hour(s))  CULTURE, BLOOD (ROUTINE X 2)     Status: None   Collection Time    01/12/14 12:49 PM      Result Value Ref Range Status   Specimen Description BLOOD RIGHT ARM   Final   Special Requests BOTTLES DRAWN AEROBIC AND ANAEROBIC 6CC   Final   Culture NO GROWTH 2 DAYS   Final   Report Status PENDING   Incomplete  CULTURE, BLOOD (ROUTINE X 2)     Status: None   Collection Time    01/12/14 12:49 PM      Result Value Ref Range Status   Specimen Description BLOOD RIGHT ANTECUBITAL   Final   Special Requests BOTTLES DRAWN AEROBIC AND ANAEROBIC 6CC   Final   Culture NO GROWTH 2 DAYS   Final   Report Status PENDING   Incomplete     Labs: Basic Metabolic Panel:  Recent Labs Lab 01/12/14 0936 01/13/14 0530  NA 141 141  K 4.7 4.0  CL 101 101  CO2 30 26  GLUCOSE 110* 127*  BUN 12 23  CREATININE 0.80 0.93  CALCIUM 8.8 9.3   Liver Function Tests:  Recent Labs Lab 01/12/14 0936  AST 19  ALT 18  ALKPHOS 99  BILITOT 0.6  PROT 7.4  ALBUMIN 3.6   No results found for this basename: LIPASE, AMYLASE,  in the last 168 hours No results found for this basename: AMMONIA,  in the last 168 hours CBC:  Recent Labs Lab 01/12/14 0936 01/13/14 0530  WBC 7.2 8.9  NEUTROABS 5.3  --   HGB 13.6 13.6  HCT 42.6 41.5  MCV 90.1 89.1  PLT 220 248   Cardiac Enzymes:  Recent Labs Lab 01/12/14 0936  TROPONINI <0.30   BNP: BNP (last 3 results)  Recent Labs  01/12/14 0936  PROBNP 917.1*   CBG: No results found for this basename: GLUCAP,  in the last 168 hours     Signed:  Radene Gunning  [facility] 01/14/2014, 1:28 PM

## 2014-01-14 NOTE — Care Management Note (Signed)
    Page 1 of 1   01/14/2014     4:02:48 PM   CARE MANAGEMENT NOTE 01/14/2014  Patient:  [name], [name]   Account Number:  [id number]  Date Initiated:  01/14/2014  Documentation initiated by:  Claretha Cooper  Subjective/Objective Assessment:   Pt admitted from home. Independent. No HH/DME needs identified     Action/Plan:   Anticipated DC Date:  01/14/2014   Anticipated DC Plan:  [place]  CM consult      Choice offered to / List presented to:             Status of service:  Completed, signed off Medicare Important Message given?  NA - LOS <3 / Initial given by admissions (If response is "NO", the following Medicare IM given date fields will be blank) Date Medicare IM given:   Date Additional Medicare IM given:    Discharge Disposition:  HOME/SELF CARE  Per UR Regulation:    If discussed at Long Length of Stay Meetings, dates discussed:    Comments:  01/14/14 Claretha Cooper RN BSN CM

## 2014-01-14 NOTE — Progress Notes (Signed)
Pt. Removed CPAP and stated that she did not want to put it back on.  Placed patient on nasal canula 3L.  Will continue to monitor patient.

## 2014-01-14 NOTE — Progress Notes (Signed)
AVS reviewed with patient.  Patient verbalized understanding of discharge instructions, physician follow-up and medications.  HF education provided per AVS and booklet.  Prescriptions provided to patient.  Patient's IV removed.  Site WNL.  Patient transported by NT via w/c to main entrance for discharge.  Patient stable at time of discharge.

## 2014-01-14 NOTE — Discharge Summary (Addendum)
Physician Discharge Summary  [name] QQV:[id number] DOB: 1941/07/01 DOA: 01/12/2014  PCP: Evlyn Courier, MD  Admit date: 01/12/2014 Discharge date: 01/14/2014  Time spent: 45 minutes  Recommendations for Outpatient Follow-up:  1. Dr. Alisa Graff, PCP in 1 week. Please followup final blood culture results that were sent from the hospital. Recommend following chest x-ray in 4-6 weeks to ensure resolution of pneumonia findings. 2. Ashley Akin, Cardiology    Discharge Diagnoses:  Active Problems:   HYPOTHYROIDISM   HYPERLIPIDEMIA   OBESITY, MORBID   ANXIETY   DEPRESSION   OBSTRUCTIVE SLEEP APNEA   GERD   DYSPNEA ON EXERTION   Atrial fibrillation   CAP (community acquired pneumonia)   Acute respiratory failure   Discharge Condition: stable  Diet recommendation: heart healthy  Filed Weights   01/12/14 1323 01/13/14 1544 01/14/14 0534  Weight: 131.6 kg (290 lb 2 oz) 133.993 kg (295 lb 6.4 oz) 133.811 kg (295 lb)    History of present illness:  73 yo woman with hx OSA on nightly CPAP, morbid obesity, emphysema, afib, presented to the ED on 01/12/14 with complaints of difficulty breathing, cough, low-grade fevers but denied chest pains. She was assessed to have acute respiratory failure secondary to community-acquired pneumonia.   Hospital Course:  Acute hypoxic respiratory failure secondary to community-acquired pneumonia: Patient has baseline chronic dyspnea on exertion. She uses nightly CPAP with oxygen. She was found to be hypoxic on room air with oxygen saturations of 89%. Chest x-ray had showed findings consistent with atelectasis versus infiltrate and right hilar region along with COPD.Strep pneumonia urine antigen negative. Legionella antigen negative. Influenza PCR negative. Admitted to floor and given solumedrol, nebs and started on azithromycin and rocephin. She has improved. Today she ambulated with nursing and her oxygen saturations were above 89% on room air.  She will be discharged on oral azithromycin to complete total 7 days of antibiotics. Repeat EKG shows QTC of 451 ms. Recommend followup of chest x-ray in 4-6 weeks to ensure resolution of pneumonia findings. She may have underlying element of COPD/OHS. HIV-antibody: Nonreactive.   History of Atrial fibrillation  Patient remained in sinus bradycardia in the 50s. She had undergone cardioversion in the past. Continue Eliquis, flecainide, propranolol. EKG on 01/14/14 with QTC 451 ms.   Obstructive sleep apnea  Continue nightly CPAP as well as supplemental oxygen at night.    Depression/anxiety  remained stable at baseline. Continue lorazepam and Prozac   GERD  Continue PPI   Edema/chronic diastolic CHF  Patient denied a history of CHF. Last echocardiogram February 2013 shows an EF of 55% normal systolic global function, mild pulmonary hypertension. Repeat echo revealed EF 55-60% with features consistent with grade 2 diastolic dysfunction. BNP 917. Continue home demadex.   Hypothyroidism  Continue levothyroxine.   Obesity  BMI 53.2. nutritional consult 01/13/14     Procedures:  2-decho on 01/12/14 The study was technically difficult, as a result of poor sound wave transmission and body habitus. - Left ventricle: The cavity size was mildly dilated. Wall thickness was increased in a pattern of mild LVH. Systolic function was normal. The estimated ejection fraction was in the range of 55% to 60%. Images were inadequate for LV wall motion assessment. Features are consistent with a pseudonormal left ventricular filling pattern, with concomitant abnormal relaxation and increased filling pressure (grade 2 diastolic dysfunction). Doppler parameters are consistent with high ventricular filling pressure.   Consultations:  None  Subjective Patient continues to feel better. States that her  breathing is almost 60-70% improved. Minimal cough with intermittent mild green sputum. No chest  pain.  Discharge Exam: Filed Vitals:   01/14/14 1257  BP: 134/78  Pulse: 63  Temp:   Resp:    temperature 97.40F, pulse 53-63 per minute, respiration 20 per minute, blood pressure 134/78 mmHg and oxygen saturation 94%  General exam: obese & appears comfortable  Respiratory system: Diminished breath sounds in the bases but otherwise clear to auscultation without wheezing, rhonchi or crackles. No increased work of breathing. Able to speak in full sentences.  Cardiovascular system: S1 & S2 heard, RRR. No JVD, murmurs, gallops, clicks. Trace LE edema. Telemetry: Sinus bradycardia in the 50s.  Gastrointestinal system: Abdomen is nondistended, soft and nontender. Normal bowel sounds heard.  Central nervous system: Alert and oriented. No focal neurological deficits.  Extremities: Symmetric 5 x 5 power.    Discharge Instructions      Discharge Orders   Future Orders Complete By Expires   ([place]) Call MD:  Anytime you have any of the following symptoms: 1) 3 pound weight gain in 24 hours or 5 pounds in 1 week 2) shortness of breath, with or without a dry hacking cough 3) swelling in the hands, feet or stomach 4) if you have to sleep on extra pillows at night in order to breathe.  As directed    Call MD for:  difficulty breathing, headache or visual disturbances  As directed    Call MD for:  extreme fatigue  As directed    Call MD for:  persistant dizziness or light-headedness  As directed    Call MD for:  temperature >100.4  As directed    Diet - low sodium heart healthy  As directed    Discharge instructions  As directed    Comments:     Continue to use nightly CPAP with oxygen at prior settings.   Increase activity slowly  As directed        Medication List         albuterol 90 MCG/ACT inhaler  Commonly known as:  PROVENTIL,VENTOLIN  Inhale 2 puffs into the lungs every 6 (six) hours as needed. For shortness of breath     apixaban 5 MG Tabs tablet  Commonly  known as:  ELIQUIS  Take 5 mg by mouth 2 (two) times daily.     azithromycin 250 MG tablet  Commonly known as:  ZITHROMAX  Take 2 tabs daily for 5 days starting 01/15/14     colchicine 0.6 MG tablet  Take 0.6 mg by mouth daily as needed. Gout Attack     esomeprazole 40 MG capsule  Commonly known as:  NEXIUM  Take 40 mg by mouth every evening.     flecainide 100 MG tablet  Commonly known as:  TAMBOCOR  Take 1 tablet (100 mg total) by mouth 2 (two) times daily.     FLUoxetine 20 MG capsule  Commonly known as:  PROZAC  Take 60 mg by mouth every morning.     hydrocortisone cream 1 %  Apply 1 application topically as needed. For itching     KLOR-CON M20 20 MEQ tablet  Generic drug:  potassium chloride SA  Take 20 mEq by mouth daily as needed (taken with torsemide). With Torsemide     levothyroxine 50 MCG tablet  Commonly known as:  SYNTHROID, LEVOTHROID  Take 50 mcg by mouth daily before breakfast.     LORazepam 1 MG tablet  Commonly known as:  ATIVAN  Take 1.5 mg by mouth 3 (three) times daily.     miconazole 2 % vaginal cream  Commonly known as:  MONISTAT 7  Apply 1 applicator topically at bedtime as needed. Yeast infection     predniSONE 10 MG tablet  Commonly known as:  DELTASONE  Starting 01/15/14: Take 4 tabs daily x3 days, then 3 tabs daily x3 days, then 2 tabs daily x3 days, then 1 tab daily x3 days, then stop.     propranolol 80 MG 24 hr capsule  Commonly known as:  INNOPRAN XL  Take 80 mg by mouth every morning.     torsemide 20 MG tablet  Commonly known as:  DEMADEX  Take 20 mg by mouth daily as needed (1-2 tab).       Allergies  Allergen Reactions  . Atorvastatin Other (See Comments)    Muscle sorness  . Demerol [Meperidine] Other (See Comments)    Bottoms BP.   . Meperidine Hcl Other (See Comments)    Drops BP  . Zocor [Simvastatin - High Dose] Other (See Comments)    Elevated liver enzymes   Follow-up Information   Follow up with HILL,GERALD  K, MD. Schedule an appointment as soon as possible for a visit in 1 week. (for evaluation of symptoms)    Specialty:  Family Medicine   Contact information:   [address] [place] [address] [phone]       Schedule an appointment as soon as possible for a visit with Laverda Page, MD.   Specialty:  Cardiology   Contact information:   [address] [place] [address] [phone]        The results of significant diagnostics from this hospitalization (including imaging, microbiology, ancillary and laboratory) are listed below for reference.    Significant Diagnostic Studies: Dg Chest Portable 1 View  01/12/2014   CLINICAL DATA:  Cough and fever.  EXAM: PORTABLE CHEST - 1 VIEW  COMPARISON:  PA and lateral chest x-ray dated January 02, 2013.  FINDINGS: The lungs are mildly hyperinflated. The cardiac silhouette is top-normal in size. The pulmonary vascularity is within the limits of normal. The interstitial markings are increased diffusely and there are coarse lung markings in the right infrahilar region consistent with atelectasis or developing pneumonia. No pleural effusion is evident.  IMPRESSION: The findings are consistent with atelectasis or infiltrate in the right infrahilar region. There are changes of underlying COPD.   Electronically Signed   By: David  Martinique   On: 01/12/2014 09:13    Microbiology: Recent Results (from the past 240 hour(s))  CULTURE, BLOOD (ROUTINE X 2)     Status: None   Collection Time    01/12/14 12:49 PM      Result Value Ref Range Status   Specimen Description BLOOD RIGHT ARM   Final   Special Requests BOTTLES DRAWN AEROBIC AND ANAEROBIC 6CC   Final   Culture NO GROWTH 2 DAYS   Final   Report Status PENDING   Incomplete  CULTURE, BLOOD (ROUTINE X 2)     Status: None   Collection Time    01/12/14 12:49 PM      Result Value Ref Range Status   Specimen Description BLOOD RIGHT ANTECUBITAL   Final   Special  Requests BOTTLES DRAWN AEROBIC AND ANAEROBIC 6CC   Final   Culture NO GROWTH 2 DAYS   Final   Report Status PENDING   Incomplete  Labs: Basic Metabolic Panel:  Recent Labs Lab 01/12/14 0936 01/13/14 0530  NA 141 141  K 4.7 4.0  CL 101 101  CO2 30 26  GLUCOSE 110* 127*  BUN 12 23  CREATININE 0.80 0.93  CALCIUM 8.8 9.3   Liver Function Tests:  Recent Labs Lab 01/12/14 0936  AST 19  ALT 18  ALKPHOS 99  BILITOT 0.6  PROT 7.4  ALBUMIN 3.6   No results found for this basename: LIPASE, AMYLASE,  in the last 168 hours No results found for this basename: AMMONIA,  in the last 168 hours CBC:  Recent Labs Lab 01/12/14 0936 01/13/14 0530  WBC 7.2 8.9  NEUTROABS 5.3  --   HGB 13.6 13.6  HCT 42.6 41.5  MCV 90.1 89.1  PLT 220 248   Cardiac Enzymes:  Recent Labs Lab 01/12/14 0936  TROPONINI <0.30   BNP: BNP (last 3 results)  Recent Labs  01/12/14 0936  PROBNP 917.1*   CBG: No results found for this basename: GLUCAP,  in the last 168 hours     Signed:  Kyran Whittier  [facility] 01/14/2014, 3:47 PM

## 2014-01-15 DIAGNOSIS — R0609 Other forms of dyspnea: Secondary | ICD-10-CM | POA: Diagnosis not present

## 2014-01-15 DIAGNOSIS — R0989 Other specified symptoms and signs involving the circulatory and respiratory systems: Secondary | ICD-10-CM | POA: Diagnosis not present

## 2014-01-15 DIAGNOSIS — I4891 Unspecified atrial fibrillation: Secondary | ICD-10-CM | POA: Diagnosis not present

## 2014-01-15 DIAGNOSIS — J218 Acute bronchiolitis due to other specified organisms: Secondary | ICD-10-CM | POA: Diagnosis not present

## 2014-01-15 DIAGNOSIS — J209 Acute bronchitis, unspecified: Secondary | ICD-10-CM | POA: Diagnosis not present

## 2014-01-17 LAB — CULTURE, BLOOD (ROUTINE X 2)
CULTURE: NO GROWTH
CULTURE: NO GROWTH

## 2014-01-19 NOTE — Discharge Summary (Signed)
Please see edited DC summary.  Vernell Leep, MD, FACP, FHM. [facility] Pager [phone]  If 7PM-7AM, please contact night-coverage [email or web address] Password [facility] 01/19/2014, 4:19 PM

## 2014-01-25 DIAGNOSIS — J189 Pneumonia, unspecified organism: Secondary | ICD-10-CM | POA: Diagnosis not present

## 2014-02-01 DIAGNOSIS — J189 Pneumonia, unspecified organism: Secondary | ICD-10-CM | POA: Diagnosis not present

## 2014-04-21 DIAGNOSIS — R0609 Other forms of dyspnea: Secondary | ICD-10-CM | POA: Diagnosis not present

## 2014-04-21 DIAGNOSIS — G4733 Obstructive sleep apnea (adult) (pediatric): Secondary | ICD-10-CM | POA: Diagnosis not present

## 2014-04-21 DIAGNOSIS — I4891 Unspecified atrial fibrillation: Secondary | ICD-10-CM | POA: Diagnosis not present

## 2014-04-21 DIAGNOSIS — E662 Morbid (severe) obesity with alveolar hypoventilation: Secondary | ICD-10-CM | POA: Diagnosis not present

## 2014-04-27 DIAGNOSIS — E039 Hypothyroidism, unspecified: Secondary | ICD-10-CM | POA: Diagnosis not present

## 2014-04-27 DIAGNOSIS — J45909 Unspecified asthma, uncomplicated: Secondary | ICD-10-CM | POA: Diagnosis not present

## 2014-04-27 DIAGNOSIS — I10 Essential (primary) hypertension: Secondary | ICD-10-CM | POA: Diagnosis not present

## 2014-05-03 ENCOUNTER — Encounter: Payer: Self-pay | Admitting: Internal Medicine

## 2014-05-03 ENCOUNTER — Ambulatory Visit (INDEPENDENT_AMBULATORY_CARE_PROVIDER_SITE_OTHER)
Admission: RE | Admit: 2014-05-03 | Discharge: 2014-05-03 | Disposition: A | Discharge status: Home / Self Care | Payer: Medicare Other | Source: Ambulatory Visit | Attending: Internal Medicine | Admitting: Internal Medicine

## 2014-05-03 ENCOUNTER — Ambulatory Visit (INDEPENDENT_AMBULATORY_CARE_PROVIDER_SITE_OTHER): Payer: Medicare Other | Admitting: Internal Medicine

## 2014-05-03 VITALS — BP 114/62 | HR 46 | Temp 98.1°F | Ht 62.0 in | Wt 307.8 lb

## 2014-05-03 DIAGNOSIS — R0600 Dyspnea, unspecified: Secondary | ICD-10-CM

## 2014-05-03 DIAGNOSIS — J45909 Unspecified asthma, uncomplicated: Secondary | ICD-10-CM

## 2014-05-03 DIAGNOSIS — R0609 Other forms of dyspnea: Secondary | ICD-10-CM | POA: Diagnosis not present

## 2014-05-03 DIAGNOSIS — R0989 Other specified symptoms and signs involving the circulatory and respiratory systems: Secondary | ICD-10-CM

## 2014-05-03 DIAGNOSIS — J449 Chronic obstructive pulmonary disease, unspecified: Secondary | ICD-10-CM | POA: Diagnosis not present

## 2014-05-03 DIAGNOSIS — J8410 Pulmonary fibrosis, unspecified: Secondary | ICD-10-CM | POA: Diagnosis not present

## 2014-05-03 NOTE — Progress Notes (Signed)
Subjective:    Patient ID: [name], female    DOB: 02/06/1941   MRN: [id number]  HPI  74 yowf quit smoking 1990 when bad bronchitis/sob x 7 months recovery a lot better weight 260  And ever since still needed freq trips for breathing problems dx as asthma but no need maint rx but changed for the worse since started on rx for afib ? Inderal  in 2014 and turned into more daily symptoms of sob esp bending over and walking across the room referred to pulmonary clinic  05/03/2014 by Dr Nadyne Coombes for eval sob not responding to albuterol - already changed to lopressor and no better   05/03/2014 1st [facility] office visit/ Raymond Azure  Chief Complaint  Patient presents with  . Pulmonary Consult    Referred per Dr. Clayton Lefort. Pt states that she has had breathing problems "for years" but she has been worse since had PNA approx 4 months ago. She states that she is SOB "constantly"- but esp worse with exertion.  She gets out of breath walking "three steps".     cough no more than usual  Some problems with stuffy nose and constant drip > clear mucus and urge to clear the throat day > night  Breathing comes and goes = exertion only (ie never at rest, always with the same intensity of exertion)  No obvious other patterns in day to day or daytime variabilty or assoc chronic cough or cp or chest tightness, subjective wheeze overt sinus or hb symptoms. No unusual exp hx or h/o childhood pna/ asthma or knowledge of premature birth.  Sleeping ok on 02/cipap without nocturnal  or early am exacerbation  of respiratory  c/o's or need for noct saba. Also denies any obvious fluctuation of symptoms with weather or environmental changes or other aggravating or alleviating factors except as outlined above   Current Medications, Allergies, Complete Past Medical History, Past Surgical History, Family History, and Social History were reviewed in [employer] record.           Review of Systems    Constitutional: Negative for fever, chills and unexpected weight change.  HENT: Positive for congestion. Negative for dental problem, ear pain, nosebleeds, postnasal drip, rhinorrhea, sinus pressure, sneezing, sore throat, trouble swallowing and voice change.   Eyes: Negative for visual disturbance.  Respiratory: Positive for cough and shortness of breath. Negative for choking.   Cardiovascular: Positive for chest pain and leg swelling.  Gastrointestinal: Positive for abdominal pain. Negative for vomiting and diarrhea.  Genitourinary: Negative for difficulty urinating.  Musculoskeletal: Positive for arthralgias.  Skin: Positive for rash.  Neurological: Negative for tremors, syncope and headaches.  Hematological: Does not bruise/bleed easily.       Objective:   Physical Exam  Wt Readings from Last 3 Encounters:  05/03/14 307 lb 12.8 oz (139.617 kg)  01/14/14 295 lb (133.811 kg)  06/09/13 295 lb (133.811 kg)     HEENT: nl dentition, turbinates, and orophanx. Nl external ear canals without cough reflex   NECK :  without JVD/Nodes/TM/ nl carotid upstrokes bilaterally   LUNGS: no acc muscle use, clear to A and P bilaterally without cough on insp or exp maneuvers   CV:  RRR  no s3 or murmur or increase in P2, no edema   ABD:  soft and nontender with nl excursion in the supine position. No bruits or organomegaly, bowel sounds nl  MS:  warm without deformities, calf tenderness, cyanosis or clubbing  SKIN: warm and dry without lesions    NEURO:  alert, approp, no deficits    CXR  05/03/2014 :  There are findings consistent with COPD and previous granulomatous infection. One cannot exclude acute bronchitis in the appropriate clinical setting. There is no pneumonia nor evidence of CHF      Assessment & Plan:

## 2014-05-03 NOTE — Progress Notes (Signed)
Quick Note:  ATC NA and VM not set up WCB ______

## 2014-05-03 NOTE — Patient Instructions (Addendum)
Nexium Take 30-60 min before first meal of the day   Only use your albuterol as a rescue medication to be used if you can't catch your breath by resting or doing a relaxed purse lip breathing pattern.  - The less you use it, the better it will work when you need it. - Ok to use up to 2 puffs  every 4 hours if you must but call for immediate appointment if use goes up over your usual need - Don't leave home without it !!  (think of it like the spare tire for your car)   Work on inhaler technique:  relax and gently blow all the way out then take a nice smooth deep breath back in, triggering the inhaler at same time you start breathing in.  Hold for up to 5 seconds if you can.  Rinse and gargle with water when done  GERD (REFLUX)  is an extremely common cause of respiratory symptoms, many times with no significant heartburn at all.    It can be treated with medication, but also with lifestyle changes including avoidance of late meals, excessive alcohol, smoking cessation, and avoid fatty foods, chocolate, peppermint, colas, red wine, and acidic juices such as orange juice.  NO MINT OR MENTHOL PRODUCTS SO NO COUGH DROPS  USE SUGARLESS CANDY INSTEAD (jolley ranchers or Stover's)  NO OIL BASED VITAMINS - use powdered substitutes.  Please remember to go to the  x-ray department downstairs for your tests - we will call you with the results when they are available.  Please schedule a follow up office visit in 4 weeks, sooner if needed with pfts

## 2014-05-04 NOTE — Assessment & Plan Note (Addendum)
No evidence PE on anticoagulation chronically, could have element of obesty/ def element of diast chf ? Also air flow obst ? >Needs pfts to sort out and in meantime just use effective saba prn

## 2014-05-04 NOTE — Assessment & Plan Note (Addendum)
-   05/03/2014 p extensive coaching HFA effectiveness =    75% from a baseline of < 25%  DDX of  difficult airways managment all start with A and  include Adherence, Ace Inhibitors, Acid Reflux, Active Sinus Disease, Alpha 1 Antitripsin deficiency, Anxiety masquerading as Airways dz,  ABPA,  allergy(esp in young), Aspiration (esp in elderly), Adverse effects of DPI,  Active smokers, plus two Bs  = Bronchiectasis and Beta blocker use..and one C= CHF   Adherence is always the initial "prime suspect" and is a multilayered concern that requires a "trust but verify" approach in every patient - starting with knowing how to use medications, especially inhalers, correctly, keeping up with refills and understanding the fundamental difference between maintenance and prns vs those medications only taken for a very short course and then stopped and not refilled.  The proper method of use, as well as anticipated side effects, of a metered-dose inhaler are discussed and demonstrated to the patient. Improved effectiveness after extensive coaching during this visit to a level of approximately  75% though baseline < 25% so not clear how much real asthma she had vs BB  ? BB effect > Strongly prefer in this setting: Bystolic, the most beta -1  selective Beta blocker available in sample form, with bisoprolol the most selective generic choice  on the market. Ok fo now to just leave on lopressor though if titrated up may need to change to bisoprolol  ? Component chf > defer to cards Lab Results  Component Value Date   PROBNP 917.1* 01/12/2014      ? Acid (or non-acid) GERD > always difficult to exclude as up to 75% of pts in some series report no assoc GI/ Heartburn symptoms> rec max (24h)  acid suppression and diet restrictions/ reviewed and instructions given in writing.

## 2014-05-05 NOTE — Progress Notes (Signed)
Quick Note:  ATC, NA ______

## 2014-06-08 ENCOUNTER — Emergency Department (HOSPITAL_COMMUNITY)
Admission: EM | Admit: 2014-06-08 | Discharge: 2014-06-08 | Disposition: A | Discharge status: Home / Self Care | Payer: Medicare Other | Attending: Emergency Medicine | Admitting: Emergency Medicine

## 2014-06-08 ENCOUNTER — Encounter (HOSPITAL_COMMUNITY): Payer: Self-pay | Admitting: Emergency Medicine

## 2014-06-08 DIAGNOSIS — Z8669 Personal history of other diseases of the nervous system and sense organs: Secondary | ICD-10-CM | POA: Insufficient documentation

## 2014-06-08 DIAGNOSIS — F329 Major depressive disorder, single episode, unspecified: Secondary | ICD-10-CM | POA: Diagnosis not present

## 2014-06-08 DIAGNOSIS — E039 Hypothyroidism, unspecified: Secondary | ICD-10-CM | POA: Diagnosis not present

## 2014-06-08 DIAGNOSIS — K219 Gastro-esophageal reflux disease without esophagitis: Secondary | ICD-10-CM | POA: Diagnosis not present

## 2014-06-08 DIAGNOSIS — J039 Acute tonsillitis, unspecified: Secondary | ICD-10-CM | POA: Diagnosis not present

## 2014-06-08 DIAGNOSIS — Z87891 Personal history of nicotine dependence: Secondary | ICD-10-CM | POA: Diagnosis not present

## 2014-06-08 DIAGNOSIS — J45909 Unspecified asthma, uncomplicated: Secondary | ICD-10-CM | POA: Insufficient documentation

## 2014-06-08 DIAGNOSIS — F411 Generalized anxiety disorder: Secondary | ICD-10-CM | POA: Insufficient documentation

## 2014-06-08 DIAGNOSIS — Z860100 Personal history of colon polyps, unspecified: Secondary | ICD-10-CM | POA: Insufficient documentation

## 2014-06-08 DIAGNOSIS — I4891 Unspecified atrial fibrillation: Secondary | ICD-10-CM | POA: Diagnosis not present

## 2014-06-08 DIAGNOSIS — I10 Essential (primary) hypertension: Secondary | ICD-10-CM | POA: Insufficient documentation

## 2014-06-08 DIAGNOSIS — Z79899 Other long term (current) drug therapy: Secondary | ICD-10-CM | POA: Insufficient documentation

## 2014-06-08 DIAGNOSIS — F3289 Other specified depressive episodes: Secondary | ICD-10-CM | POA: Diagnosis not present

## 2014-06-08 DIAGNOSIS — Z7901 Long term (current) use of anticoagulants: Secondary | ICD-10-CM | POA: Diagnosis not present

## 2014-06-08 DIAGNOSIS — R51 Headache: Secondary | ICD-10-CM | POA: Diagnosis not present

## 2014-06-08 DIAGNOSIS — Z8739 Personal history of other diseases of the musculoskeletal system and connective tissue: Secondary | ICD-10-CM | POA: Insufficient documentation

## 2014-06-08 DIAGNOSIS — H1130 Conjunctival hemorrhage, unspecified eye: Secondary | ICD-10-CM | POA: Diagnosis not present

## 2014-06-08 DIAGNOSIS — Z0389 Encounter for observation for other suspected diseases and conditions ruled out: Secondary | ICD-10-CM | POA: Diagnosis not present

## 2014-06-08 LAB — CBC WITH DIFFERENTIAL/PLATELET
Basophils Absolute: 0 10*3/uL (ref 0.0–0.1)
Basophils Relative: 0 % (ref 0–1)
Eosinophils Absolute: 0.1 10*3/uL (ref 0.0–0.7)
Eosinophils Relative: 1 % (ref 0–5)
HEMATOCRIT: 42.9 % (ref 36.0–46.0)
HEMOGLOBIN: 13.6 g/dL (ref 12.0–15.0)
LYMPHS ABS: 0.8 10*3/uL (ref 0.7–4.0)
Lymphocytes Relative: 8 % — ABNORMAL LOW (ref 12–46)
MCH: 28.8 pg (ref 26.0–34.0)
MCHC: 31.7 g/dL (ref 30.0–36.0)
MCV: 90.9 fL (ref 78.0–100.0)
MONOS PCT: 15 % — AB (ref 3–12)
Monocytes Absolute: 1.4 10*3/uL — ABNORMAL HIGH (ref 0.1–1.0)
Neutro Abs: 7.1 10*3/uL (ref 1.7–7.7)
Neutrophils Relative %: 76 % (ref 43–77)
Platelets: 198 10*3/uL (ref 150–400)
RBC: 4.72 MIL/uL (ref 3.87–5.11)
RDW: 12.9 % (ref 11.5–15.5)
WBC: 9.3 10*3/uL (ref 4.0–10.5)

## 2014-06-08 LAB — BASIC METABOLIC PANEL
Anion gap: 18 — ABNORMAL HIGH (ref 5–15)
BUN: 14 mg/dL (ref 6–23)
CO2: 24 mEq/L (ref 19–32)
CREATININE: 0.8 mg/dL (ref 0.50–1.10)
Calcium: 9.4 mg/dL (ref 8.4–10.5)
Chloride: 97 mEq/L (ref 96–112)
GFR calc Af Amer: 83 mL/min — ABNORMAL LOW (ref >90)
GFR, EST NON AFRICAN AMERICAN: 72 mL/min — AB (ref >90)
GLUCOSE: 106 mg/dL — AB (ref 70–99)
Potassium: 4.1 mEq/L (ref 3.7–5.3)
Sodium: 139 mEq/L (ref 137–147)

## 2014-06-08 LAB — RAPID STREP SCREEN (MED CTR MEBANE ONLY): Streptococcus, Group A Screen (Direct): NEGATIVE

## 2014-06-08 MED ORDER — SODIUM CHLORIDE 0.9 % IV BOLUS (SEPSIS)
500.0000 mL | Freq: Once | INTRAVENOUS | Status: AC
  Administered 2014-06-08: 500 mL via INTRAVENOUS

## 2014-06-08 MED ORDER — AZITHROMYCIN 250 MG PO TABS: 500.0000 mg | ORAL_TABLET | Freq: Once | ORAL | Status: DC

## 2014-06-08 MED ORDER — FENTANYL CITRATE 0.05 MG/ML IJ SOLN
50.0000 ug | Freq: Once | INTRAMUSCULAR | Status: AC
  Administered 2014-06-08: 50 ug via INTRAVENOUS
  Filled 2014-06-08: qty 2

## 2014-06-08 MED ORDER — DEXAMETHASONE SODIUM PHOSPHATE 10 MG/ML IJ SOLN
10.0000 mg | Freq: Once | INTRAMUSCULAR | Status: AC
  Administered 2014-06-08: 10 mg via INTRAVENOUS
  Filled 2014-06-08: qty 1

## 2014-06-08 MED ORDER — HYDROCODONE-ACETAMINOPHEN 7.5-325 MG/15ML PO SOLN: 15.0000 mL | ORAL | Status: DC | PRN

## 2014-06-08 MED ORDER — AZITHROMYCIN 250 MG PO TABS: 250.0000 mg | ORAL_TABLET | Freq: Every day | ORAL | Status: DC

## 2014-06-08 MED ORDER — ONDANSETRON HCL 4 MG/2ML IJ SOLN
4.0000 mg | Freq: Once | INTRAMUSCULAR | Status: AC
  Administered 2014-06-08: 4 mg via INTRAVENOUS
  Filled 2014-06-08: qty 2

## 2014-06-08 MED ORDER — SODIUM CHLORIDE 0.9 % IV SOLN: Freq: Once | INTRAVENOUS | Status: DC

## 2014-06-08 MED ORDER — FAMOTIDINE IN NACL 20-0.9 MG/50ML-% IV SOLN
20.0000 mg | Freq: Once | INTRAVENOUS | Status: AC
  Administered 2014-06-08: 20 mg via INTRAVENOUS
  Filled 2014-06-08: qty 50

## 2014-06-08 MED ORDER — DEXTROSE 5 % IV SOLN
500.0000 mg | Freq: Once | INTRAVENOUS | Status: AC
  Administered 2014-06-08: 500 mg via INTRAVENOUS
  Filled 2014-06-08: qty 500

## 2014-06-08 NOTE — Discharge Instructions (Signed)
Sore Throat A sore throat is pain, burning, irritation, or scratchiness of the throat. There is often pain or tenderness when swallowing or talking. A sore throat may be accompanied by other symptoms, such as coughing, sneezing, fever, and swollen neck glands. A sore throat is often the first sign of another sickness, such as a cold, flu, strep throat, or mononucleosis (commonly known as mono). Most sore throats go away without medical treatment. CAUSES  The most common causes of a sore throat include:  A viral infection, such as a cold, flu, or mono.  A bacterial infection, such as strep throat, tonsillitis, or whooping cough.  Seasonal allergies.  Dryness in the air.  Irritants, such as smoke or pollution.  Gastroesophageal reflux disease (GERD). HOME CARE INSTRUCTIONS   Only take over-the-counter medicines as directed by your caregiver.  Drink enough fluids to keep your urine clear or pale yellow.  Rest as needed.  Try using throat sprays, lozenges, or sucking on hard candy to ease any pain (if older than 4 years or as directed).  Sip warm liquids, such as broth, herbal tea, or warm water with honey to relieve pain temporarily. You may also eat or drink cold or frozen liquids such as frozen ice pops.  Gargle with salt water (mix 1 tsp salt with 8 oz of water).  Do not smoke and avoid secondhand smoke.  Put a cool-mist humidifier in your bedroom at night to moisten the air. You can also turn on a hot shower and sit in the bathroom with the door closed for 5-10 minutes. SEEK IMMEDIATE MEDICAL CARE IF:  You have difficulty breathing.  You are unable to swallow fluids, soft foods, or your saliva.  You have increased swelling in the throat.  Your sore throat does not get better in 7 days.  You have nausea and vomiting.  You have a fever or persistent symptoms for more than 2-3 days.  You have a fever and your symptoms suddenly get worse. MAKE SURE YOU:   Understand  these instructions.  Will watch your condition.  Will get help right away if you are not doing well or get worse. Document Released: 12/20/2004 Document Revised: 10/29/2012 Document Reviewed: 07/20/2012 [facility] Patient Information 2015 [place], [place]. This information is not intended to replace advice given to you by your health care provider. Make sure you discuss any questions you have with your health care provider. Sore Throat A sore throat is pain, burning, irritation, or scratchiness of the throat. There is often pain or tenderness when swallowing or talking. A sore throat may be accompanied by other symptoms, such as coughing, sneezing, fever, and swollen neck glands. A sore throat is often the first sign of another sickness, such as a cold, flu, strep throat, or mononucleosis (commonly known as mono). Most sore throats go away without medical treatment. CAUSES  The most common causes of a sore throat include:  A viral infection, such as a cold, flu, or mono.  A bacterial infection, such as strep throat, tonsillitis, or whooping cough.  Seasonal allergies.  Dryness in the air.  Irritants, such as smoke or pollution.  Gastroesophageal reflux disease (GERD). HOME CARE INSTRUCTIONS   Only take over-the-counter medicines as directed by your caregiver.  Drink enough fluids to keep your urine clear or pale yellow.  Rest as needed.  Try using throat sprays, lozenges, or sucking on hard candy to ease any pain (if older than 4 years or as directed).  Sip warm liquids, such  as broth, herbal tea, or warm water with honey to relieve pain temporarily. You may also eat or drink cold or frozen liquids such as frozen ice pops.  Gargle with salt water (mix 1 tsp salt with 8 oz of water).  Do not smoke and avoid secondhand smoke.  Put a cool-mist humidifier in your bedroom at night to moisten the air. You can also turn on a hot shower and sit in the bathroom with the door closed for  5-10 minutes. SEEK IMMEDIATE MEDICAL CARE IF:  You have difficulty breathing.  You are unable to swallow fluids, soft foods, or your saliva.  You have increased swelling in the throat.  Your sore throat does not get better in 7 days.  You have nausea and vomiting.  You have a fever or persistent symptoms for more than 2-3 days.  You have a fever and your symptoms suddenly get worse. MAKE SURE YOU:   Understand these instructions.  Will watch your condition.  Will get help right away if you are not doing well or get worse. Document Released: 12/20/2004 Document Revised: 10/29/2012 Document Reviewed: 07/20/2012 [facility] Patient Information 2015 [place], [place]. This information is not intended to replace advice given to you by your health care provider. Make sure you discuss any questions you have with your health care provider. Tonsillitis Tonsillitis is an infection of the throat that causes the tonsils to become red, tender, and swollen. Tonsils are collections of lymphoid tissue at the back of the throat. Each tonsil has crevices (crypts). Tonsils help fight nose and throat infections and keep infection from spreading to other parts of the body for the first 18 months of life.  CAUSES Sudden (acute) tonsillitis is usually caused by infection with streptococcal bacteria. Long-lasting (chronic) tonsillitis occurs when the crypts of the tonsils become filled with pieces of food and bacteria, which makes it easy for the tonsils to become repeatedly infected. SYMPTOMS  Symptoms of tonsillitis include:  A sore throat, with possible difficulty swallowing.  White patches on the tonsils.  Fever.  Tiredness.  New episodes of snoring during sleep, when you did not snore before.  Small, foul-smelling, yellowish-white pieces of material (tonsilloliths) that you occasionally cough up or spit out. The tonsilloliths can also cause you to have bad breath. DIAGNOSIS Tonsillitis can be  diagnosed through a physical exam. Diagnosis can be confirmed with the results of lab tests, including a throat culture. TREATMENT  The goals of tonsillitis treatment include the reduction of the severity and duration of symptoms and prevention of associated conditions. Symptoms of tonsillitis can be improved with the use of steroids to reduce the swelling. Tonsillitis caused by bacteria can be treated with antibiotic medicines. Usually, treatment with antibiotic medicines is started before the cause of the tonsillitis is known. However, if it is determined that the cause is not bacterial, antibiotic medicines will not treat the tonsillitis. If attacks of tonsillitis are severe and frequent, your health care provider may recommend surgery to remove the tonsils (tonsillectomy). HOME CARE INSTRUCTIONS   Rest as much as possible and get plenty of sleep.  Drink plenty of fluids. While the throat is very sore, eat soft foods or liquids, such as sherbet, soups, or instant breakfast drinks.  Eat frozen ice pops.  Gargle with a warm or cold liquid to help soothe the throat. Mix 1/4 teaspoon of salt and 1/4 teaspoon of baking soda in in 8 oz of water. SEEK MEDICAL CARE IF:   Large, tender lumps  develop in your neck.  A rash develops.  A green, yellow-brown, or bloody substance is coughed up.  You are unable to swallow liquids or food for 24 hours.  You notice that only one of the tonsils is swollen. SEEK IMMEDIATE MEDICAL CARE IF:   You develop any new symptoms such as vomiting, severe headache, stiff neck, chest pain, or trouble breathing or swallowing.  You have severe throat pain along with drooling or voice changes.  You have severe pain, unrelieved with recommended medications.  You are unable to fully open the mouth.  You develop redness, swelling, or severe pain anywhere in the neck.  You have a fever. MAKE SURE YOU:   Understand these instructions.  Will watch your  condition.  Will get help right away if you are not doing well or get worse. Document Released: 08/22/2005 Document Revised: 11/17/2013 Document Reviewed: 05/01/2013 [facility] Patient Information 2015 [place], [place]. This information is not intended to replace advice given to you by your health care provider. Make sure you discuss any questions you have with your health care provider.

## 2014-06-08 NOTE — ED Provider Notes (Signed)
CSN: [id number]     Arrival date & time 06/08/14  1206 History   First MD Initiated Contact with Patient 06/08/14 1228     Chief Complaint  Patient presents with  . Sore Throat  . Fever  . Headache  . Lymphadenopathy     (Consider location/radiation/quality/duration/timing/severity/associated sxs/prior Treatment) Patient is a 73 y.o. female presenting with pharyngitis, fever, and headaches. The history is provided by the patient. No language interpreter was used.  Sore Throat Associated symptoms include chills, a fever, headaches, myalgias and a sore throat. Pertinent negatives include no abdominal pain, chest pain, congestion, coughing, nausea or rash. Associated symptoms comments: She presents after being seen by her primary care physician for extremely sore throat, generalized body aches, and fever. No nausea or vomiting. She denies cough, increase to her usual wheezing or SOB. She reports her daughter was diagnosed with ophthalmic strep infection recently, otherwise, no sick contacts at home. .  Fever Associated symptoms: chills, headaches, myalgias and sore throat   Associated symptoms: no chest pain, no congestion, no cough, no nausea and no rash   Headache Associated symptoms: fever, myalgias and sore throat   Associated symptoms: no abdominal pain, no congestion, no cough and no nausea     Past Medical History  Diagnosis Date  . GERD (gastroesophageal reflux disease)   . Fibromyalgia   . Anxiety   . Depression   . Obesity, morbid (more than 100 lbs over ideal weight or BMI > 40)   . Hyperlipemia   . Hypothyroidism   . Obstructive sleep apnea on CPAP and O2  . Migraine   . Colon polyp     TCS 2005 TICS [place], TCS/PROPOFOL 2011 SIMPLE ADENOMAS  . Allergic rhinitis   . Osteoarthritis   . Narcolepsy   . Cholecystitis   . Persistent atrial fibrillation     a. s/p DCCV 01/2012  . Diverticulosis 2005  . Internal hemorrhoids     H/o Rectal bleeding secondary to internal  hemorrhoids 04/2010.  . Volume overload     uses torsemide PRN  . Fatty liver   . Gout   . Asthma     pt states she "believe it is more allergies"   Past Surgical History  Procedure Laterality Date  . Abdominal hysterectomy  FIBROIDS 1982  . Rhinoplasty    . Bladder suspension  1982  . Kidney stent  1995  . Cardioversion  02/05/2012    Procedure: CARDIOVERSION;  Surgeon: Laverda Page, MD;  Location: [facility];  Service: Cardiovascular;  Laterality: N/A;  . Cholecystectomy  09/03/2012    Procedure: LAPAROSCOPIC CHOLECYSTECTOMY;  Surgeon: Jamesetta So, MD;  Location: [facility];  Service: General;  Laterality: N/A;  . Colonoscopy  2005    Dr. Tamala Julian: sigmoid diverticulosis  . Colonoscopy  June 2011    Dr. Oneida Alar: internal hemorrhoids, simple adenomas, hyperplastic polyps, needs surveillance in June 2014 with overtube and Propofol  . Sphincterotomy  10/01/2012    Procedure: SPHINCTEROTOMY;  Surgeon: Daneil Dolin, MD;  Location: [facility];  Service: Endoscopy;  Laterality: N/A;  . Removal of stones  10/01/2012    Procedure: REMOVAL OF STONES;  Surgeon: Daneil Dolin, MD;  Location: [facility];  Service: Endoscopy;  Laterality: N/A;  . Ercp  10/01/2012    Procedure: ENDOSCOPIC RETROGRADE CHOLANGIOPANCREATOGRAPHY (ERCP);  Surgeon: Daneil Dolin, MD;  Location: [facility];  Service: Endoscopy;  Laterality: N/A;  . Cardioversion N/A 06/09/2013    Procedure: CARDIOVERSION;  Surgeon:  Laverda Page, MD;  Location[facility];  Service: Cardiovascular;  Laterality: N/A;  h&p in file-[place]   Family History  Problem Relation Age of Onset  . Colon cancer Mother 61  . Diabetes Mother   . Cerebral aneurysm Father   . Asthma Mother   . Asthma Maternal Aunt   . Asthma Brother   . Heart disease Mother   . Heart disease Maternal Grandfather   . Heart disease Maternal Grandmother    History  Substance Use Topics  . Smoking status: Former Smoker -- 0.50 packs/day for 30 years    Quit date: 10/03/1989  .  Smokeless tobacco: Never Used  . Alcohol Use: No   OB History   Grav Para Term Preterm Abortions TAB SAB Ect Mult Living                 Review of Systems  Constitutional: Positive for fever and chills.  HENT: Positive for sore throat. Negative for congestion.   Respiratory: Negative for cough.   Cardiovascular: Negative for chest pain.  Gastrointestinal: Negative for nausea and abdominal pain.  Musculoskeletal: Positive for myalgias.  Skin: Negative for rash.  Neurological: Positive for headaches.      Allergies  Atorvastatin; Demerol; Meperidine hcl; and Zocor  Home Medications   Prior to Admission medications   Medication Sig Start Date End Date Taking? Authorizing Provider  albuterol (PROVENTIL,VENTOLIN) 90 MCG/ACT inhaler Inhale 2 puffs into the lungs every 6 (six) hours as needed. For shortness of breath   Yes Historical Provider, MD  apixaban (ELIQUIS) 5 MG TABS tablet Take 5 mg by mouth 2 (two) times daily.   Yes Historical Provider, MD  esomeprazole (NEXIUM) 40 MG capsule Take 40 mg by mouth daily.    Yes Historical Provider, MD  flecainide (TAMBOCOR) 100 MG tablet Take 1 tablet (100 mg total) by mouth 2 (two) times daily. 04/17/13  Yes Thompson Grayer, MD  flecainide (TAMBOCOR) 100 MG tablet Take 100 mg by mouth 2 (two) times daily.   Yes Historical Provider, MD  FLUoxetine (PROZAC) 20 MG capsule Take 60 mg by mouth every morning.    Yes Historical Provider, MD  levothyroxine (SYNTHROID, LEVOTHROID) 50 MCG tablet Take 50 mcg by mouth daily before breakfast.   Yes Historical Provider, MD  loratadine (CLARITIN) 10 MG tablet Take 10 mg by mouth daily as needed for allergies.   Yes Historical Provider, MD  LORazepam (ATIVAN) 1 MG tablet Take 1.5 mg by mouth 3 (three) times daily.   Yes Historical Provider, MD  metoprolol tartrate (LOPRESSOR) 25 MG tablet Take 25 mg by mouth 3 (three) times daily.   Yes Historical Provider, MD  potassium chloride SA (KLOR-CON M20) 20 MEQ tablet  Take 20 mEq by mouth daily as needed (taken with torsemide). With Torsemide   Yes Historical Provider, MD  torsemide (DEMADEX) 20 MG tablet Take 20-40 mg by mouth daily as needed (1-2 tab).    Yes Historical Provider, MD   BP 113/43  Pulse 68  Temp(Src) 99.7 F (37.6 C) (Temporal)  Resp 21  Ht 5\' 2"  (1.575 m)  Wt 300 lb (136.079 kg)  BMI 54.86 kg/m2  SpO2 99% Physical Exam  Constitutional: She is oriented to person, place, and time. She appears well-developed and well-nourished. No distress.  HENT:  Head: Normocephalic.  Right Ear: External ear normal.  Left Ear: External ear normal.  Mouth/Throat: Oropharyngeal exudate present.  Tonsillar swelling and exudates, right greater than left. Uvula midline.  Eyes: Conjunctivae are normal.  Neck: Normal range of motion. Neck supple. No tracheal deviation present.  Cardiovascular: Normal rate.   No murmur heard. Pulmonary/Chest: Effort normal. No stridor. She has wheezes. She has no rales.  Musculoskeletal: Normal range of motion. She exhibits no edema.  Lymphadenopathy:    She has cervical adenopathy.  Neurological: She is alert and oriented to person, place, and time.  Skin: Skin is warm and dry.  Psychiatric: She has a normal mood and affect.    ED Course  Procedures (including critical care time) Labs Review Labs Reviewed  CBC WITH DIFFERENTIAL - Abnormal; Notable for the following:    Lymphocytes Relative 8 (*)    Monocytes Relative 15 (*)    Monocytes Absolute 1.4 (*)    All other components within normal limits  BASIC METABOLIC PANEL - Abnormal; Notable for the following:    Glucose, Bld 106 (*)    GFR calc non Af Amer 72 (*)    GFR calc Af Amer 83 (*)    Anion gap 18 (*)    All other components within normal limits  RAPID STREP SCREEN  CULTURE, GROUP A STREP   Results for orders placed during the hospital encounter of 06/08/14  RAPID STREP SCREEN      Result Value Ref Range   Streptococcus, Group A Screen  (Direct) NEGATIVE  NEGATIVE  CBC WITH DIFFERENTIAL      Result Value Ref Range   WBC 9.3  4.0 - 10.5 K/uL   RBC 4.72  3.87 - 5.11 MIL/uL   Hemoglobin 13.6  12.0 - 15.0 g/dL   HCT 42.9  36.0 - 46.0 %   MCV 90.9  78.0 - 100.0 fL   MCH 28.8  26.0 - 34.0 pg   MCHC 31.7  30.0 - 36.0 g/dL   RDW 12.9  11.5 - 15.5 %   Platelets 198  150 - 400 K/uL   Neutrophils Relative % 76  43 - 77 %   Neutro Abs 7.1  1.7 - 7.7 K/uL   Lymphocytes Relative 8 (*) 12 - 46 %   Lymphs Abs 0.8  0.7 - 4.0 K/uL   Monocytes Relative 15 (*) 3 - 12 %   Monocytes Absolute 1.4 (*) 0.1 - 1.0 K/uL   Eosinophils Relative 1  0 - 5 %   Eosinophils Absolute 0.1  0.0 - 0.7 K/uL   Basophils Relative 0  0 - 1 %   Basophils Absolute 0.0  0.0 - 0.1 K/uL  BASIC METABOLIC PANEL      Result Value Ref Range   Sodium 139  137 - 147 mEq/L   Potassium 4.1  3.7 - 5.3 mEq/L   Chloride 97  96 - 112 mEq/L   CO2 24  19 - 32 mEq/L   Glucose, Bld 106 (*) 70 - 99 mg/dL   BUN 14  6 - 23 mg/dL   Creatinine, Ser 0.80  0.50 - 1.10 mg/dL   Calcium 9.4  8.4 - 10.5 mg/dL   GFR calc non Af Amer 72 (*) >90 mL/min   GFR calc Af Amer 83 (*) >90 mL/min   Anion gap 18 (*) 5 - 15    Imaging Review No results found.   EKG Interpretation   Date/Time:  Tuesday June 08 2014 12:10:16 EDT Ventricular Rate:  71 PR Interval:  166 QRS Duration: 88 QT Interval:  406 QTC Calculation: 441 R Axis:   -67 Text Interpretation:  Normal sinus rhythm Left  axis deviation Low voltage  QRS Septal infarct , age undetermined Abnormal ECG No significant change  since last tracing Confirmed by GOLDSTON  MD, SCOTT ([id number]) on 06/08/2014  1:24:28 PM      MDM   Final diagnoses:  None    1. Exudative tonsillitis  She bilateral tonsillar swelling and exudates with midline uvula. Doubt peritonsillar abscess or Ludwig's angina (no dysphagia, oral findings except tonsillar swelling, no stiff neck). No leukocytosis. Strep negative, however, given patient's  advanced age, subjective fever and exudates on exam, will cover with Zithromax. Patient reports she is comfortable with discharge home. She has been able to drink fluids in ED without difficulty. Decadron given, abx started.     Dewaine Oats, PA-C 06/08/14 1541

## 2014-06-08 NOTE — ED Notes (Signed)
Shari, PA at bedside. 

## 2014-06-08 NOTE — ED Notes (Signed)
Pt. Stated, I started having sore throat and now I can't even swallow.  I've been running fever, my glands are swollen.  I was sent here by Dr. Berdine Addison

## 2014-06-08 NOTE — ED Notes (Signed)
Pt to ED from [place] office c/o weakness, sore throat, and fever since Friday.  Pt reports throat is painful to swallow. Tonsils swollen, red with white sores.  Last took tylenol last night.  Pt reports waking up on Saturday with redness to L eye, denies pain or difficulty with vision. Also c/o headache starting on Saturday as well, no neuro deficits noted at this time. Pt reports sister was dx with "strep in her eye." Had strep swab completed at PCP's office.

## 2014-06-09 NOTE — ED Provider Notes (Signed)
Medical screening examination/treatment/procedure(s) were conducted as a shared visit with non-physician practitioner(s) and myself.  I personally evaluated the patient during the encounter.   EKG Interpretation   Date/Time:  Tuesday June 08 2014 12:10:16 EDT Ventricular Rate:  71 PR Interval:  166 QRS Duration: 88 QT Interval:  406 QTC Calculation: 441 R Axis:   -67 Text Interpretation:  Normal sinus rhythm Left axis deviation Low voltage  QRS Septal infarct , age undetermined Abnormal ECG No significant change  since last tracing Confirmed by Trenese Haft  MD, Omie Ferger ([id number]) on 06/08/2014  1:24:28 PM       Patient with fever, sore throat. Will cover with abx. Able to drink in ED. Symptoms improved. Will d/c  Ephraim Hamburger, MD 06/09/14 [phone]

## 2014-06-10 ENCOUNTER — Ambulatory Visit: Payer: Medicare Other | Admitting: Internal Medicine

## 2014-06-10 ENCOUNTER — Emergency Department (HOSPITAL_COMMUNITY)
Admission: EM | Admit: 2014-06-10 | Discharge: 2014-06-10 | Disposition: A | Discharge status: Home / Self Care | Payer: Medicare Other | Attending: Emergency Medicine | Admitting: Emergency Medicine

## 2014-06-10 ENCOUNTER — Encounter (HOSPITAL_COMMUNITY): Payer: Self-pay | Admitting: Emergency Medicine

## 2014-06-10 DIAGNOSIS — E039 Hypothyroidism, unspecified: Secondary | ICD-10-CM | POA: Insufficient documentation

## 2014-06-10 DIAGNOSIS — J45901 Unspecified asthma with (acute) exacerbation: Secondary | ICD-10-CM | POA: Insufficient documentation

## 2014-06-10 DIAGNOSIS — Z8639 Personal history of other endocrine, nutritional and metabolic disease: Secondary | ICD-10-CM | POA: Diagnosis not present

## 2014-06-10 DIAGNOSIS — H1130 Conjunctival hemorrhage, unspecified eye: Secondary | ICD-10-CM | POA: Diagnosis not present

## 2014-06-10 DIAGNOSIS — H1132 Conjunctival hemorrhage, left eye: Secondary | ICD-10-CM

## 2014-06-10 DIAGNOSIS — Z862 Personal history of diseases of the blood and blood-forming organs and certain disorders involving the immune mechanism: Secondary | ICD-10-CM | POA: Diagnosis not present

## 2014-06-10 DIAGNOSIS — I4891 Unspecified atrial fibrillation: Secondary | ICD-10-CM | POA: Diagnosis not present

## 2014-06-10 DIAGNOSIS — Z79899 Other long term (current) drug therapy: Secondary | ICD-10-CM | POA: Insufficient documentation

## 2014-06-10 DIAGNOSIS — Z7901 Long term (current) use of anticoagulants: Secondary | ICD-10-CM | POA: Insufficient documentation

## 2014-06-10 DIAGNOSIS — Z792 Long term (current) use of antibiotics: Secondary | ICD-10-CM | POA: Insufficient documentation

## 2014-06-10 DIAGNOSIS — F3289 Other specified depressive episodes: Secondary | ICD-10-CM | POA: Diagnosis not present

## 2014-06-10 DIAGNOSIS — Z860100 Personal history of colon polyps, unspecified: Secondary | ICD-10-CM | POA: Insufficient documentation

## 2014-06-10 DIAGNOSIS — J039 Acute tonsillitis, unspecified: Secondary | ICD-10-CM

## 2014-06-10 DIAGNOSIS — K219 Gastro-esophageal reflux disease without esophagitis: Secondary | ICD-10-CM | POA: Insufficient documentation

## 2014-06-10 DIAGNOSIS — F329 Major depressive disorder, single episode, unspecified: Secondary | ICD-10-CM | POA: Diagnosis not present

## 2014-06-10 DIAGNOSIS — F411 Generalized anxiety disorder: Secondary | ICD-10-CM | POA: Diagnosis not present

## 2014-06-10 DIAGNOSIS — J358 Other chronic diseases of tonsils and adenoids: Secondary | ICD-10-CM | POA: Diagnosis not present

## 2014-06-10 DIAGNOSIS — Z87891 Personal history of nicotine dependence: Secondary | ICD-10-CM | POA: Diagnosis not present

## 2014-06-10 DIAGNOSIS — I10 Essential (primary) hypertension: Secondary | ICD-10-CM | POA: Diagnosis not present

## 2014-06-10 DIAGNOSIS — J029 Acute pharyngitis, unspecified: Secondary | ICD-10-CM | POA: Diagnosis present

## 2014-06-10 LAB — CULTURE, GROUP A STREP

## 2014-06-10 MED ORDER — HYDROCODONE-ACETAMINOPHEN 7.5-325 MG/15ML PO SOLN
10.0000 mL | Freq: Once | ORAL | Status: AC
  Administered 2014-06-10: 10 mL via ORAL
  Filled 2014-06-10: qty 15

## 2014-06-10 MED ORDER — MAGIC MOUTHWASH W/LIDOCAINE: 5.0000 mL | Freq: Three times a day (TID) | ORAL | Status: DC | PRN

## 2014-06-10 MED ORDER — PREDNISONE 10 MG PO TABS
60.0000 mg | ORAL_TABLET | Freq: Once | ORAL | Status: AC
  Administered 2014-06-10: 60 mg via ORAL
  Filled 2014-06-10 (×2): qty 1

## 2014-06-10 MED ORDER — CLINDAMYCIN HCL 300 MG PO CAPS: 300.0000 mg | ORAL_CAPSULE | Freq: Four times a day (QID) | ORAL | Status: DC

## 2014-06-10 NOTE — ED Provider Notes (Signed)
Medical screening examination/treatment/procedure(s) were conducted as a shared visit with non-physician practitioner(s) and myself.  I personally evaluated the patient during the encounter.  Exudative tonsillitis bilateral without muffled voice without trismus and with midline uvula Doubt PTA.  Babette Relic, MD 06/11/14 [phone]

## 2014-06-10 NOTE — ED Notes (Signed)
Pt c/o fever and sore throat since Saturday.  Pt went to Dr. Berdine Addison Tuesday and was sent to [facility] ED because of severity of sore throat and redness to left eye.   Reports was diagnosed with tonsilitis.  Reports received IV antibiotics and was sent home on antibiotics and pain medication.  Pt says is no better.

## 2014-06-10 NOTE — Discharge Instructions (Signed)
Tonsillitis Tonsillitis is an infection of the throat. This infection causes the tonsils to become red, tender, and puffy (swollen). Tonsils are groups of tissue at the back of your throat. If bacteria caused your infection, antibiotic medicine will be given to you. Sometimes symptoms of tonsillitis can be relieved with the use of steroid medicine. If your tonsillitis is severe and happens often, you may need to get your tonsils removed (tonsillectomy). HOME CARE   Rest and sleep often.  Drink enough fluids to keep your pee (urine) clear or pale yellow.  While your throat is sore, eat soft or liquid foods like:  Soup.  Ice cream.  Instant breakfast drinks.  Eat frozen ice pops.  Gargle with a warm or cold liquid to help soothe the throat. Gargle with a water and salt mix. Mix 1/4 teaspoon of salt and 1/4 teaspoon of baking soda in 1 cup of water.  Only take medicines as told by your doctor.  If you are given medicines (antibiotics), take them as told. Finish them even if you start to feel better. GET HELP IF:  You have large, tender lumps in your neck.  You have a rash.  You cough up green, yellow-brown, or bloody fluid.  You cannot swallow liquids or food for 24 hours.  You notice that only one of your tonsils is swollen. GET HELP RIGHT AWAY IF:   You throw up (vomit).  You have a very bad headache.  You have a stiff neck.  You have chest pain.  You have trouble breathing or swallowing.  You have bad throat pain, drooling, or your voice changes.  You have bad pain not helped by medicine.  You cannot fully open your mouth.  You have redness, puffiness, or bad pain in the neck.  You have a fever. MAKE SURE YOU:   Understand these instructions.  Will watch your condition.  Will get help right away if you are not doing well or get worse. Document Released: 04/30/2008 Document Revised: 11/17/2013 Document Reviewed: 05/01/2013 [facility] Patient Information  2015 [place], [place]. This information is not intended to replace advice given to you by your health care provider. Make sure you discuss any questions you have with your health care provider.  Subconjunctival Hemorrhage Your exam shows you have a subconjunctival hemorrhage. This is a harmless collection of blood covering a portion of the white of the eye. This condition may be due to injury or to straining (lifting, sneezing, or coughing). Often, there is no known cause. Subconjunctival blood does not cause pain or vision problems. This condition needs no treatment. It will take 1 to 2 weeks for the blood to dissolve. If you take aspirin or Coumadin on a daily basis or if you have high blood pressure, you should check with your doctor about the need for further treatment. Please call your doctor if you have problems with your vision, pain around the eye, or any other concerns about your condition. Document Released: 12/20/2004 Document Revised: 02/04/2012 Document Reviewed: 10/10/2009 [facility] Patient Information 2015 [place], [place]. This information is not intended to replace advice given to you by your health care provider. Make sure you discuss any questions you have with your health care provider.

## 2014-06-10 NOTE — ED Provider Notes (Signed)
CSN: [id number]     Arrival date & time 06/10/14  9678 History  This chart was scribed for Kem Parkinson PA-C  working with Babette Relic, MD by Stacy Gardner, ED scribe. This patient was seen in room [facility]12/[facility]12 and the patient's care was started at 8:38 AM.   First MD Initiated Contact with Patient 06/10/14 0831     Chief Complaint  Patient presents with  . Sore Throat     (Consider location/radiation/quality/duration/timing/severity/associated sxs/prior Treatment) Patient is a 73 y.o. female presenting with pharyngitis. The history is provided by the patient and medical records. No language interpreter was used.  Sore Throat Associated symptoms include headaches. Pertinent negatives include no abdominal pain and no shortness of breath.   HPI Comments: [name] is a 73 y.o. female who presents to the Emergency Department complaining of sore throat onset five days ago, that is much worse today.   Pt reports that she was initially feeling better; however, yesterday she had increased throat pain and trouble swallowing last night. Pt is currently taking antibiotics. Pt has a productive cough with chest tightness. She mentions having a frontal headache that has also been present since onset of throat pain. Pt reports having a fever of 101 upon waking this morning. She has adenopathy. She was seen by Dr. Berdine Addison two days ago for the same complaint. Pt was sent to [facility] ED due the severity of her symptoms and left eye redness. Denies vision changes or pain.  She denies straining with cough but she does strain to swallow. Pt was treated with IV medications for tonsillitis on her previous ED visit. Pt is taking Nexium at night and Eliquis. She denies vomiting, abdominal pain, chest pain, neck pain or shortness of breath  She is unsure of hx of asthma however she uses an prescribed inhaler. Pt's PCP is Dr. Iona Beard.    Past Medical History  Diagnosis Date  . GERD (gastroesophageal reflux  disease)   . Fibromyalgia   . Anxiety   . Depression   . Obesity, morbid (more than 100 lbs over ideal weight or BMI > 40)   . Hyperlipemia   . Hypothyroidism   . Obstructive sleep apnea on CPAP and O2  . Migraine   . Colon polyp     TCS 2005 TICS [place], TCS/PROPOFOL 2011 SIMPLE ADENOMAS  . Allergic rhinitis   . Osteoarthritis   . Narcolepsy   . Cholecystitis   . Persistent atrial fibrillation     a. s/p DCCV 01/2012  . Diverticulosis 2005  . Internal hemorrhoids     H/o Rectal bleeding secondary to internal hemorrhoids 04/2010.  . Volume overload     uses torsemide PRN  . Fatty liver   . Gout   . Asthma     pt states she "believe it is more allergies"   Past Surgical History  Procedure Laterality Date  . Abdominal hysterectomy  FIBROIDS 1982  . Rhinoplasty    . Bladder suspension  1982  . Kidney stent  1995  . Cardioversion  02/05/2012    Procedure: CARDIOVERSION;  Surgeon: Laverda Page, MD;  Location: [place];  Service: Cardiovascular;  Laterality: N/A;  . Cholecystectomy  09/03/2012    Procedure: LAPAROSCOPIC CHOLECYSTECTOMY;  Surgeon: Jamesetta So, MD;  Location: [facility];  Service: General;  Laterality: N/A;  . Colonoscopy  2005    Dr. Tamala Julian: sigmoid diverticulosis  . Colonoscopy  June 2011    Dr. Oneida Alar: internal hemorrhoids, simple  adenomas, hyperplastic polyps, needs surveillance in June 2014 with overtube and Propofol  . Sphincterotomy  10/01/2012    Procedure: SPHINCTEROTOMY;  Surgeon: Daneil Dolin, MD;  Location: [facility];  Service: Endoscopy;  Laterality: N/A;  . Removal of stones  10/01/2012    Procedure: REMOVAL OF STONES;  Surgeon: Daneil Dolin, MD;  Location: [facility];  Service: Endoscopy;  Laterality: N/A;  . Ercp  10/01/2012    Procedure: ENDOSCOPIC RETROGRADE CHOLANGIOPANCREATOGRAPHY (ERCP);  Surgeon: Daneil Dolin, MD;  Location: [facility];  Service: Endoscopy;  Laterality: N/A;  . Cardioversion N/A 06/09/2013    Procedure: CARDIOVERSION;  Surgeon: Laverda Page, MD;  [facility];  Service: Cardiovascular;  Laterality: N/A;  h&p in file-[facility]   Family History  Problem Relation Age of Onset  . Colon cancer Mother 29  . Diabetes Mother   . Cerebral aneurysm Father   . Asthma Mother   . Asthma Maternal Aunt   . Asthma Brother   . Heart disease Mother   . Heart disease Maternal Grandfather   . Heart disease Maternal Grandmother    History  Substance Use Topics  . Smoking status: Former Smoker -- 0.50 packs/day for 30 years    Quit date: 10/03/1989  . Smokeless tobacco: Never Used  . Alcohol Use: No   OB History   Grav Para Term Preterm Abortions TAB SAB Ect Mult Living                 Review of Systems  Constitutional: Positive for fever. Negative for chills, activity change and appetite change.  HENT: Positive for sore throat. Negative for voice change.        Pain with swallowing  Eyes: Positive for redness. Negative for visual disturbance.  Respiratory: Positive for cough and wheezing. Negative for shortness of breath and stridor.   Gastrointestinal: Negative for abdominal pain.  Skin: Negative for rash.  Neurological: Positive for headaches. Negative for dizziness, speech difficulty, weakness and numbness.  Hematological: Positive for adenopathy.  All other systems reviewed and are negative.     Allergies  Atorvastatin; Demerol; Meperidine hcl; and Zocor  Home Medications   Prior to Admission medications   Medication Sig Start Date End Date Taking? Authorizing Provider  albuterol (PROVENTIL,VENTOLIN) 90 MCG/ACT inhaler Inhale 2 puffs into the lungs every 6 (six) hours as needed. For shortness of breath    Historical Provider, MD  apixaban (ELIQUIS) 5 MG TABS tablet Take 5 mg by mouth 2 (two) times daily.    Historical Provider, MD  azithromycin (ZITHROMAX Z-PAK) 250 MG tablet Take 1 tablet (250 mg total) by mouth daily. 06/08/14   Shari A Upstill, PA-C  esomeprazole (NEXIUM) 40 MG capsule Take 40 mg by  mouth daily.     Historical Provider, MD  flecainide (TAMBOCOR) 100 MG tablet Take 1 tablet (100 mg total) by mouth 2 (two) times daily. 04/17/13   Thompson Grayer, MD  flecainide (TAMBOCOR) 100 MG tablet Take 100 mg by mouth 2 (two) times daily.    Historical Provider, MD  FLUoxetine (PROZAC) 20 MG capsule Take 60 mg by mouth every morning.     Historical Provider, MD  HYDROcodone-acetaminophen (HYCET) 7.5-325 mg/15 ml solution Take 15 mLs by mouth every 4 (four) hours as needed for moderate pain or severe pain. 06/08/14   Shari A Upstill, PA-C  levothyroxine (SYNTHROID, LEVOTHROID) 50 MCG tablet Take 50 mcg by mouth daily before breakfast.    Historical Provider, MD  loratadine (  CLARITIN) 10 MG tablet Take 10 mg by mouth daily as needed for allergies.    Historical Provider, MD  LORazepam (ATIVAN) 1 MG tablet Take 1.5 mg by mouth 3 (three) times daily.    Historical Provider, MD  metoprolol tartrate (LOPRESSOR) 25 MG tablet Take 25 mg by mouth 3 (three) times daily.    Historical Provider, MD  potassium chloride SA (KLOR-CON M20) 20 MEQ tablet Take 20 mEq by mouth daily as needed (taken with torsemide). With Torsemide    Historical Provider, MD  torsemide (DEMADEX) 20 MG tablet Take 20-40 mg by mouth daily as needed (1-2 tab).     Historical Provider, MD   BP 110/50  Pulse 74  Temp(Src) 98.8 F (37.1 C) (Oral)  Resp 22  Ht 5\' 2"  (1.575 m)  Wt 300 lb (136.079 kg)  BMI 54.86 kg/m2  SpO2 100% Physical Exam  Nursing note and vitals reviewed. Constitutional: She is oriented to person, place, and time. She appears well-developed and well-nourished. No distress.  HENT:  Head: Atraumatic.  Right Ear: Hearing, tympanic membrane, external ear and ear canal normal.  Left Ear: Hearing, tympanic membrane, external ear and ear canal normal.  bilateral edema and erythema of the tonsil with exudates present. Uvula is midline No peritonsillar abscess.    Eyes: EOM are normal. Pupils are equal, round, and  reactive to light. Left eye exhibits no chemosis, no discharge, no exudate and no hordeolum. Left conjunctiva has a hemorrhage.  Left subconjunctival hemorrhage present  Neck: Normal range of motion. Neck supple. No tracheal deviation present.  Mild to moderate cervical lymphadenopathy with right greater than left.   Cardiovascular: Normal rate, regular rhythm, normal heart sounds and intact distal pulses.   No murmur heard. Pulmonary/Chest: Effort normal. No respiratory distress. She has wheezes. She has no rales. She exhibits no tenderness.  Mild inspiratory and expiratory wheezes present, no rales  Abdominal: Soft. Normal appearance. She exhibits no distension and no mass. There is no splenomegaly. There is no tenderness. There is no rebound and no guarding.  Musculoskeletal: Normal range of motion. She exhibits no edema.  Lymphadenopathy:    She has cervical adenopathy.  Neurological: She is alert and oriented to person, place, and time. She exhibits normal muscle tone. Coordination normal.  Skin: Skin is warm and dry.  Psychiatric: She has a normal mood and affect. Her behavior is normal.    ED Course  Procedures (including critical care time) DIAGNOSTIC STUDIES: Oxygen Saturation is 100% on room air, normal by my interpretation.    COORDINATION OF CARE:  8:46 AM Discussed course of care with pt which includes Deltasone 60 mg and Hycet.  Pt understands and agrees.   Labs Review Labs Reviewed - No data to display  Imaging Review No results found.   EKG Interpretation None      MDM    Pt was also seen by Dr. Stevie Kern and care plan discussed.    Previous ED chart reviewed.   Final diagnoses:  Tonsillitis with exudate  Subconjunctival hemorrhage of left eye   Pt appears uncomfortable, but non-toxic.  VSS.  Airway is patent.  She has bilateral exudative, enlarged tonsils.  No concerning sx's for PTA.  Uvula is midline.  Pt has hycet at home for pain, will change  antibiotic to clindamycin and prescribe prednisone taper and magic mouthwash.  She agrees to f/u with her PMD next week.  She handles own secretions well and appears stable for d/c and agrees to  plan.  I personally performed the services described in this documentation, which was scribed in my presence. The recorded information has been reviewed and is accurate.   Merwin Breden L. Serenitee Fuertes, PA-C 06/10/14 0945

## 2014-06-17 DIAGNOSIS — F411 Generalized anxiety disorder: Secondary | ICD-10-CM | POA: Diagnosis not present

## 2014-06-17 DIAGNOSIS — J039 Acute tonsillitis, unspecified: Secondary | ICD-10-CM | POA: Diagnosis not present

## 2014-06-25 DIAGNOSIS — E662 Morbid (severe) obesity with alveolar hypoventilation: Secondary | ICD-10-CM | POA: Diagnosis not present

## 2014-06-25 DIAGNOSIS — I4891 Unspecified atrial fibrillation: Secondary | ICD-10-CM | POA: Diagnosis not present

## 2014-06-25 DIAGNOSIS — R0609 Other forms of dyspnea: Secondary | ICD-10-CM | POA: Diagnosis not present

## 2014-07-19 DIAGNOSIS — H524 Presbyopia: Secondary | ICD-10-CM | POA: Diagnosis not present

## 2014-07-19 DIAGNOSIS — H5200 Hypermetropia, unspecified eye: Secondary | ICD-10-CM | POA: Diagnosis not present

## 2014-07-19 DIAGNOSIS — H52229 Regular astigmatism, unspecified eye: Secondary | ICD-10-CM | POA: Diagnosis not present

## 2014-07-19 DIAGNOSIS — H43819 Vitreous degeneration, unspecified eye: Secondary | ICD-10-CM | POA: Diagnosis not present

## 2014-08-12 DIAGNOSIS — J45909 Unspecified asthma, uncomplicated: Secondary | ICD-10-CM | POA: Diagnosis not present

## 2014-08-12 DIAGNOSIS — I4891 Unspecified atrial fibrillation: Secondary | ICD-10-CM | POA: Diagnosis not present

## 2014-08-12 DIAGNOSIS — I10 Essential (primary) hypertension: Secondary | ICD-10-CM | POA: Diagnosis not present

## 2014-08-12 DIAGNOSIS — Z7901 Long term (current) use of anticoagulants: Secondary | ICD-10-CM | POA: Diagnosis not present

## 2014-10-12 DIAGNOSIS — R5382 Chronic fatigue, unspecified: Secondary | ICD-10-CM | POA: Diagnosis not present

## 2014-10-12 DIAGNOSIS — E6609 Other obesity due to excess calories: Secondary | ICD-10-CM | POA: Diagnosis not present

## 2014-10-12 DIAGNOSIS — E039 Hypothyroidism, unspecified: Secondary | ICD-10-CM | POA: Diagnosis not present

## 2014-10-12 DIAGNOSIS — E669 Obesity, unspecified: Secondary | ICD-10-CM | POA: Diagnosis not present

## 2014-10-12 DIAGNOSIS — Z23 Encounter for immunization: Secondary | ICD-10-CM | POA: Diagnosis not present

## 2014-10-12 DIAGNOSIS — J4530 Mild persistent asthma, uncomplicated: Secondary | ICD-10-CM | POA: Diagnosis not present

## 2014-10-12 DIAGNOSIS — F419 Anxiety disorder, unspecified: Secondary | ICD-10-CM | POA: Diagnosis not present

## 2014-10-12 DIAGNOSIS — R001 Bradycardia, unspecified: Secondary | ICD-10-CM | POA: Diagnosis not present

## 2014-12-12 ENCOUNTER — Emergency Department (HOSPITAL_COMMUNITY): Payer: Medicare Other

## 2014-12-12 ENCOUNTER — Emergency Department (HOSPITAL_COMMUNITY)
Admission: EM | Admit: 2014-12-12 | Discharge: 2014-12-12 | Disposition: A | Discharge status: Home / Self Care | Payer: Medicare Other | Attending: Emergency Medicine | Admitting: Emergency Medicine

## 2014-12-12 ENCOUNTER — Encounter (HOSPITAL_COMMUNITY): Payer: Self-pay | Admitting: *Deleted

## 2014-12-12 DIAGNOSIS — F419 Anxiety disorder, unspecified: Secondary | ICD-10-CM | POA: Insufficient documentation

## 2014-12-12 DIAGNOSIS — F329 Major depressive disorder, single episode, unspecified: Secondary | ICD-10-CM | POA: Diagnosis not present

## 2014-12-12 DIAGNOSIS — R110 Nausea: Secondary | ICD-10-CM | POA: Diagnosis not present

## 2014-12-12 DIAGNOSIS — R079 Chest pain, unspecified: Secondary | ICD-10-CM | POA: Diagnosis not present

## 2014-12-12 DIAGNOSIS — I481 Persistent atrial fibrillation: Secondary | ICD-10-CM | POA: Diagnosis not present

## 2014-12-12 DIAGNOSIS — G4733 Obstructive sleep apnea (adult) (pediatric): Secondary | ICD-10-CM | POA: Diagnosis not present

## 2014-12-12 DIAGNOSIS — Z90710 Acquired absence of both cervix and uterus: Secondary | ICD-10-CM | POA: Diagnosis not present

## 2014-12-12 DIAGNOSIS — Z9981 Dependence on supplemental oxygen: Secondary | ICD-10-CM | POA: Insufficient documentation

## 2014-12-12 DIAGNOSIS — G43909 Migraine, unspecified, not intractable, without status migrainosus: Secondary | ICD-10-CM | POA: Insufficient documentation

## 2014-12-12 DIAGNOSIS — J45901 Unspecified asthma with (acute) exacerbation: Secondary | ICD-10-CM | POA: Diagnosis not present

## 2014-12-12 DIAGNOSIS — Z9889 Other specified postprocedural states: Secondary | ICD-10-CM | POA: Diagnosis not present

## 2014-12-12 DIAGNOSIS — M797 Fibromyalgia: Secondary | ICD-10-CM | POA: Insufficient documentation

## 2014-12-12 DIAGNOSIS — M109 Gout, unspecified: Secondary | ICD-10-CM | POA: Insufficient documentation

## 2014-12-12 DIAGNOSIS — Z86010 Personal history of colonic polyps: Secondary | ICD-10-CM | POA: Insufficient documentation

## 2014-12-12 DIAGNOSIS — R531 Weakness: Secondary | ICD-10-CM | POA: Diagnosis not present

## 2014-12-12 DIAGNOSIS — R109 Unspecified abdominal pain: Secondary | ICD-10-CM | POA: Diagnosis not present

## 2014-12-12 DIAGNOSIS — R05 Cough: Secondary | ICD-10-CM | POA: Diagnosis not present

## 2014-12-12 DIAGNOSIS — E039 Hypothyroidism, unspecified: Secondary | ICD-10-CM | POA: Diagnosis not present

## 2014-12-12 DIAGNOSIS — M546 Pain in thoracic spine: Secondary | ICD-10-CM | POA: Insufficient documentation

## 2014-12-12 DIAGNOSIS — K219 Gastro-esophageal reflux disease without esophagitis: Secondary | ICD-10-CM | POA: Insufficient documentation

## 2014-12-12 DIAGNOSIS — R0602 Shortness of breath: Secondary | ICD-10-CM

## 2014-12-12 DIAGNOSIS — Z87891 Personal history of nicotine dependence: Secondary | ICD-10-CM | POA: Insufficient documentation

## 2014-12-12 DIAGNOSIS — R062 Wheezing: Secondary | ICD-10-CM | POA: Diagnosis not present

## 2014-12-12 DIAGNOSIS — Z7901 Long term (current) use of anticoagulants: Secondary | ICD-10-CM | POA: Insufficient documentation

## 2014-12-12 DIAGNOSIS — Z79899 Other long term (current) drug therapy: Secondary | ICD-10-CM | POA: Insufficient documentation

## 2014-12-12 LAB — COMPREHENSIVE METABOLIC PANEL
ALT: 24 U/L (ref 0–35)
AST: 27 U/L (ref 0–37)
Albumin: 3.9 g/dL (ref 3.5–5.2)
Alkaline Phosphatase: 112 U/L (ref 39–117)
Anion gap: 8 (ref 5–15)
BILIRUBIN TOTAL: 0.9 mg/dL (ref 0.3–1.2)
BUN: 12 mg/dL (ref 6–23)
CO2: 28 mmol/L (ref 19–32)
Calcium: 9.1 mg/dL (ref 8.4–10.5)
Chloride: 101 mEq/L (ref 96–112)
Creatinine, Ser: 0.87 mg/dL (ref 0.50–1.10)
GFR calc Af Amer: 75 mL/min — ABNORMAL LOW (ref >90)
GFR calc non Af Amer: 65 mL/min — ABNORMAL LOW (ref >90)
Glucose, Bld: 114 mg/dL — ABNORMAL HIGH (ref 70–99)
Potassium: 4.1 mmol/L (ref 3.5–5.1)
SODIUM: 137 mmol/L (ref 135–145)
Total Protein: 7.3 g/dL (ref 6.0–8.3)

## 2014-12-12 LAB — CBC WITH DIFFERENTIAL/PLATELET
Basophils Absolute: 0 10*3/uL (ref 0.0–0.1)
Basophils Relative: 0 % (ref 0–1)
EOS ABS: 0.1 10*3/uL (ref 0.0–0.7)
Eosinophils Relative: 1 % (ref 0–5)
HCT: 43.4 % (ref 36.0–46.0)
Hemoglobin: 13.7 g/dL (ref 12.0–15.0)
Lymphocytes Relative: 3 % — ABNORMAL LOW (ref 12–46)
Lymphs Abs: 0.4 10*3/uL — ABNORMAL LOW (ref 0.7–4.0)
MCH: 28.6 pg (ref 26.0–34.0)
MCHC: 31.6 g/dL (ref 30.0–36.0)
MCV: 90.6 fL (ref 78.0–100.0)
MONOS PCT: 2 % — AB (ref 3–12)
Monocytes Absolute: 0.2 10*3/uL (ref 0.1–1.0)
Neutro Abs: 10.7 10*3/uL — ABNORMAL HIGH (ref 1.7–7.7)
Neutrophils Relative %: 94 % — ABNORMAL HIGH (ref 43–77)
Platelets: 237 10*3/uL (ref 150–400)
RBC: 4.79 MIL/uL (ref 3.87–5.11)
RDW: 13.3 % (ref 11.5–15.5)
WBC: 11.3 10*3/uL — ABNORMAL HIGH (ref 4.0–10.5)

## 2014-12-12 LAB — I-STAT TROPONIN, ED: Troponin i, poc: 0 ng/mL (ref 0.00–0.08)

## 2014-12-12 LAB — BRAIN NATRIURETIC PEPTIDE: B Natriuretic Peptide: 249 pg/mL — ABNORMAL HIGH (ref 0.0–100.0)

## 2014-12-12 MED ORDER — MORPHINE SULFATE 4 MG/ML IJ SOLN
4.0000 mg | Freq: Once | INTRAMUSCULAR | Status: AC
  Administered 2014-12-12: 4 mg via INTRAVENOUS
  Filled 2014-12-12: qty 1

## 2014-12-12 MED ORDER — IPRATROPIUM-ALBUTEROL 0.5-2.5 (3) MG/3ML IN SOLN
3.0000 mL | Freq: Once | RESPIRATORY_TRACT | Status: AC
  Administered 2014-12-12: 3 mL via RESPIRATORY_TRACT
  Filled 2014-12-12: qty 3

## 2014-12-12 MED ORDER — ALBUTEROL SULFATE HFA 108 (90 BASE) MCG/ACT IN AERS
2.0000 | INHALATION_SPRAY | Freq: Once | RESPIRATORY_TRACT | Status: AC
  Administered 2014-12-12: 2 via RESPIRATORY_TRACT
  Filled 2014-12-12: qty 6.7

## 2014-12-12 MED ORDER — HYDROCODONE-ACETAMINOPHEN 5-325 MG PO TABS: 1.0000 | ORAL_TABLET | Freq: Four times a day (QID) | ORAL | Status: DC | PRN

## 2014-12-12 MED ORDER — ONDANSETRON HCL 4 MG/2ML IJ SOLN
4.0000 mg | Freq: Once | INTRAMUSCULAR | Status: AC
  Administered 2014-12-12: 4 mg via INTRAVENOUS
  Filled 2014-12-12: qty 2

## 2014-12-12 NOTE — ED Notes (Signed)
RT made aware pt needs inhaler. Pt is waiting for a ride and clothing from her husband. Pt states husband should be back within 20 minutes

## 2014-12-12 NOTE — ED Notes (Signed)
Pt comes in with multiple problems. Pt states she is having left side pain that is chronic and relates that to her kidney but states the doctor denies any kidney problems. Pt states she usually has a some breathing issues but her SOB is worse today than any other day. Pt states she is nauseous and dizzy when trying to get her in the bed. NAD noted. Pt is tearful upon arrival into room.

## 2014-12-12 NOTE — Progress Notes (Signed)
Instructed patient on use of Albuterol MDI with spacer. She was able demonstrated her understanding with good technique and effort.

## 2014-12-12 NOTE — ED Notes (Signed)
RT at bedside.

## 2014-12-12 NOTE — ED Provider Notes (Signed)
CSN: [id number]     Arrival date & time 12/12/14  1004 History   First MD Initiated Contact with Patient 12/12/14 1026     Chief Complaint  Patient presents with  . Shortness of Breath     (Consider location/radiation/quality/duration/timing/severity/associated sxs/prior Treatment) HPI Pt is a morbidly obese 74yo female with extensive medical history, including fibromyalgia, anxiety, depression, hyperlipidemia, hypothyroidism, OA, persistent afib for which she takes Eliquis, and asthma, presenting to ED with multiple complaints. Pt vague about which symptom caused her to come to ED today.  Pt c/o diffuse body pain, worse on right side, chronic per pt due to her kidney but states her PCP told her she does not have any kidney issues. Denies urinary symptoms.  Pt also c/o chest pain and SOB with nausea and dizziness when she was getting to get in her bed this morning. 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  Chest pain is aching and sore, mild in severity.  No pain medication taken for diffuse pain as pt states she does not take any pain medication at home, not even tylenol or motrin as she takes so many medications already, she does not know what she can take for pain. No recent travel or sick contacts.  Past Medical History  Diagnosis Date  . GERD (gastroesophageal reflux disease)   . Fibromyalgia   . Anxiety   . Depression   . Obesity, morbid (more than 100 lbs over ideal weight or BMI > 40)   . Hyperlipemia   . Hypothyroidism   . Obstructive sleep apnea on CPAP and O2  . Migraine   . Colon polyp     TCS 2005 TICS [place], TCS/PROPOFOL 2011 SIMPLE ADENOMAS  . Allergic rhinitis   . Osteoarthritis   . Narcolepsy   . Cholecystitis   . Persistent atrial fibrillation     a. s/p DCCV 01/2012  . Diverticulosis 2005  . Internal hemorrhoids     H/o Rectal bleeding secondary to internal  hemorrhoids 04/2010.  . Volume overload     uses torsemide PRN  . Fatty liver   . Gout   . Asthma     pt states she "believe it is more allergies"   Past Surgical History  Procedure Laterality Date  . Abdominal hysterectomy  FIBROIDS 1982  . Rhinoplasty    . Bladder suspension  1982  . Kidney stent  1995  . Cardioversion  02/05/2012    Procedure: CARDIOVERSION;  Surgeon: Laverda Page, MD;  Location: [place];  Service: Cardiovascular;  Laterality: N/A;  . Cholecystectomy  09/03/2012    Procedure: LAPAROSCOPIC CHOLECYSTECTOMY;  Surgeon: Jamesetta So, MD;  Location: [facility];  Service: General;  Laterality: N/A;  . Colonoscopy  2005    Dr. Tamala Julian: sigmoid diverticulosis  . Colonoscopy  June 2011    Dr. Oneida Alar: internal hemorrhoids, simple adenomas, hyperplastic polyps, needs surveillance in June 2014 with overtube and Propofol  . Sphincterotomy  10/01/2012    Procedure: SPHINCTEROTOMY;  Surgeon: Daneil Dolin, MD;  Location: [facility];  Service: Endoscopy;  Laterality: N/A;  . Removal of stones  10/01/2012    Procedure: REMOVAL OF STONES;  Surgeon: Daneil Dolin, MD;  Location: [facility];  Service: Endoscopy;  Laterality: N/A;  . Ercp  10/01/2012    Procedure: ENDOSCOPIC RETROGRADE CHOLANGIOPANCREATOGRAPHY (ERCP);  Surgeon: Cristopher Estimable  Rourk, MD;  Location: [facility];  Service: Endoscopy;  Laterality: N/A;  . Cardioversion N/A 06/09/2013    Procedure: CARDIOVERSION;  Surgeon: Laverda Page, MD;  Location: [facility] ENDOSCOPY;  Service: Cardiovascular;  Laterality: N/A;  h&p in file-[facility]   Family History  Problem Relation Age of Onset  . Colon cancer Mother 80  . Diabetes Mother   . Cerebral aneurysm Father   . Asthma Mother   . Asthma Maternal Aunt   . Asthma Brother   . Heart disease Mother   . Heart disease Maternal Grandfather   . Heart disease Maternal Grandmother    History  Substance Use Topics  . Smoking status: Former Smoker -- 0.50 packs/day for 30 years    Quit date: 10/03/1989  .  Smokeless tobacco: Never Used  . Alcohol Use: No   OB History    No data available     Review of Systems  Constitutional: Positive for fatigue. Negative for fever and chills.  Respiratory: Positive for cough and shortness of breath.   Cardiovascular: Positive for chest pain. Negative for palpitations and leg swelling.  Gastrointestinal: Positive for nausea. Negative for vomiting, abdominal pain, diarrhea and constipation.  Musculoskeletal: Positive for myalgias and arthralgias.  Neurological: Positive for dizziness and weakness (generalized). Negative for light-headedness and headaches.  All other systems reviewed and are negative.     Allergies  Atorvastatin; Demerol; Meperidine hcl; and Zocor  Home Medications   Prior to Admission medications   Medication Sig Start Date End Date Taking? Authorizing Provider  albuterol (PROVENTIL,VENTOLIN) 90 MCG/ACT inhaler Inhale 2 puffs into the lungs every 6 (six) hours as needed. For shortness of breath    Historical Provider, MD  Alum & Mag Hydroxide-Simeth (MAGIC MOUTHWASH W/LIDOCAINE) SOLN Take 5 mLs by mouth 3 (three) times daily as needed for mouth pain. Swish and spit, do not swallow 06/10/14   Tammy L. Triplett, PA-C  apixaban (ELIQUIS) 5 MG TABS tablet Take 5 mg by mouth 2 (two) times daily.    Historical Provider, MD  azithromycin (ZITHROMAX Z-PAK) 250 MG tablet Take 1 tablet (250 mg total) by mouth daily. 06/08/14   Shari A Upstill, PA-C  clindamycin (CLEOCIN) 300 MG capsule Take 1 capsule (300 mg total) by mouth 4 (four) times daily. 06/10/14   Tammy L. Triplett, PA-C  esomeprazole (NEXIUM) 40 MG capsule Take 40 mg by mouth daily.     Historical Provider, MD  flecainide (TAMBOCOR) 100 MG tablet Take 100 mg by mouth 2 (two) times daily.    Historical Provider, MD  FLUoxetine (PROZAC) 20 MG capsule Take 60 mg by mouth every morning.     Historical Provider, MD  HYDROcodone-acetaminophen (NORCO/VICODIN) 5-325 MG per tablet Take 1 tablet  by mouth every 6 (six) hours as needed for moderate pain or severe pain. 12/12/14   Noland Fordyce, PA-C  levothyroxine (SYNTHROID, LEVOTHROID) 50 MCG tablet Take 50 mcg by mouth daily before breakfast.    Historical Provider, MD  loratadine (CLARITIN) 10 MG tablet Take 10 mg by mouth daily as needed for allergies.    Historical Provider, MD  LORazepam (ATIVAN) 1 MG tablet Take 1.5 mg by mouth 3 (three) times daily.    Historical Provider, MD  metoprolol tartrate (LOPRESSOR) 25 MG tablet Take 25 mg by mouth 3 (three) times daily.    Historical Provider, MD  potassium chloride SA (KLOR-CON M20) 20 MEQ tablet Take 20 mEq by mouth daily as needed (taken with torsemide). With Torsemide  Historical Provider, MD  torsemide (DEMADEX) 20 MG tablet Take 20-40 mg by mouth daily as needed (1-2 tab).     Historical Provider, MD   BP 90/40 mmHg  Pulse 64  Temp(Src) 98.9 F (37.2 C) (Oral)  Resp 16  Ht 5\' 2"  (1.575 m)  Wt 300 lb (136.079 kg)  BMI 54.86 kg/m2  SpO2 96% Physical Exam  Constitutional: She appears well-developed and well-nourished. She appears distressed.  Morbidly obese female lying in exam bed, tearful. Appears uncomfortable.  HENT:  Head: Normocephalic and atraumatic.  Eyes: Conjunctivae are normal. No scleral icterus.  Neck: Normal range of motion.  Cardiovascular: Normal rate and normal heart sounds.  A regularly irregular rhythm present.  Varicose veins present in bilateral lower legs  Pulmonary/Chest: Effort normal. No respiratory distress. She has wheezes. She has no rales. She exhibits no tenderness.  Winded between sentences but no accessory muscle use. Expiratory wheeze present. No rhonchi. Decreased breath sounds in lower lung fields.  Abdominal: Soft. Bowel sounds are normal. She exhibits no distension and no mass. There is no tenderness. There is no rebound and no guarding.  Musculoskeletal: Normal range of motion. She exhibits tenderness ( Right mid back and right flank).  She exhibits no edema.  Neurological: She is alert.  Skin: Skin is warm and dry. She is not diaphoretic.  Nursing note and vitals reviewed.   ED Course  Procedures (including critical care time) Labs Review Labs Reviewed  CBC WITH DIFFERENTIAL - Abnormal; Notable for the following:    WBC 11.3 (*)    Neutrophils Relative % 94 (*)    Neutro Abs 10.7 (*)    Lymphocytes Relative 3 (*)    Lymphs Abs 0.4 (*)    Monocytes Relative 2 (*)    All other components within normal limits  COMPREHENSIVE METABOLIC PANEL - Abnormal; Notable for the following:    Glucose, Bld 114 (*)    GFR calc non Af Amer 65 (*)    GFR calc Af Amer 75 (*)    All other components within normal limits  BRAIN NATRIURETIC PEPTIDE - Abnormal; Notable for the following:    B Natriuretic Peptide 249.0 (*)    All other components within normal limits  I-STAT TROPOININ, ED    Imaging Review Dg Chest Portable 1 View  12/12/2014   CLINICAL DATA:  Cough, wheezing, history asthma, atrial fibrillation  EXAM: PORTABLE CHEST - 1 VIEW  COMPARISON:  Portable exam 1107 hr compared to 05/03/2014  FINDINGS: Enlargement of cardiac silhouette.  Mediastinal contours and pulmonary vascularity normal.  Lungs clear.  No pleural effusion or pneumothorax.  Bones unremarkable.  IMPRESSION: Mild enlargement of cardiac silhouette.  No acute abnormalities.  Nodular density at lateral LEFT base on previous exam not identified on current study.   Electronically Signed   By: Lavonia Dana M.D.   On: 12/12/2014 11:33     EKG Interpretation   Date/Time:  Sunday December 12 2014 10:29:06 EST Ventricular Rate:  67 PR Interval:  195 QRS Duration: 101 QT Interval:  433 QTC Calculation: 457 R Axis:   -61 Text Interpretation:  Sinus rhythm Atrial premature complexes in couplets  Left anterior fascicular block Anterior infarct, old Baseline wander in  lead(s) V5 Confirmed by Ares Tegtmeyer  MD, Jamisen Duerson ([id number]) on 12/12/2014 10:31:11 AM      MDM   Final  diagnoses:  SOB (shortness of breath)  Right sided abdominal pain    Pt with multiple complaints to ED, mainly concerned  for chronic right side pain and SOB.  Pt does have expiratory wheeze in exam, is tearful initially. Afebrile.  No respiratory distress, O2-93-100% on RA.  Pt is non-toxic appearing.  Low sucpision for ACS. Doubt PE Cardiac workup performed: EKG c/w previous. Troponin: negative for elevation. Labs unremarkable.  12:02 PM Pt states she is feeling much better after duoneb tx as well as morphine. Pt appears much more comfortable. No longer tearful. Lungs: CTAB. Discussed pt with Dr. Lacinda Axon who also examined pt, pt stable for discharge home to f/u with PCP. Rx: norco. Advised to take as needed for severe pain. Return precautions provided. Pt verbalized understanding and agreement with tx plan.   Pt hemodynamically stable for discharge home.      Noland Fordyce, PA-C 12/12/14 1516  Medical screening examination/treatment/procedure(s) were conducted as a shared visit with non-physician practitioner(s) and myself.  I personally evaluated the patient during the encounter.   Patient is in no acute distress. Workup reveals no acute anomalies. Patient has primary care follow-up.   EKG Interpretation   Date/Time:  Sunday December 12 2014 10:29:06 EST Ventricular Rate:  67 PR Interval:  195 QRS Duration: 101 QT Interval:  433 QTC Calculation: 457 R Axis:   -61 Text Interpretation:  Sinus rhythm Atrial premature complexes in couplets  Left anterior fascicular block Anterior infarct, old Baseline wander in  lead(s) V5 Confirmed by Alysen Smylie  MD, Yetzali Weld ([id number]) on 12/12/2014 10:31:11 AM       Nat Christen, MD 12/12/14 1556

## 2014-12-22 DIAGNOSIS — F419 Anxiety disorder, unspecified: Secondary | ICD-10-CM | POA: Diagnosis not present

## 2014-12-22 DIAGNOSIS — E039 Hypothyroidism, unspecified: Secondary | ICD-10-CM | POA: Diagnosis not present

## 2014-12-22 DIAGNOSIS — Z7901 Long term (current) use of anticoagulants: Secondary | ICD-10-CM | POA: Diagnosis not present

## 2014-12-22 DIAGNOSIS — E669 Obesity, unspecified: Secondary | ICD-10-CM | POA: Diagnosis not present

## 2014-12-29 DIAGNOSIS — I480 Paroxysmal atrial fibrillation: Secondary | ICD-10-CM | POA: Diagnosis not present

## 2014-12-29 DIAGNOSIS — Z7901 Long term (current) use of anticoagulants: Secondary | ICD-10-CM | POA: Diagnosis not present

## 2014-12-29 DIAGNOSIS — E662 Morbid (severe) obesity with alveolar hypoventilation: Secondary | ICD-10-CM | POA: Diagnosis not present

## 2014-12-29 DIAGNOSIS — E8770 Fluid overload, unspecified: Secondary | ICD-10-CM | POA: Diagnosis not present

## 2015-02-21 DIAGNOSIS — I4891 Unspecified atrial fibrillation: Secondary | ICD-10-CM | POA: Diagnosis not present

## 2015-02-21 DIAGNOSIS — K5720 Diverticulitis of large intestine with perforation and abscess without bleeding: Secondary | ICD-10-CM | POA: Diagnosis not present

## 2015-02-21 DIAGNOSIS — E039 Hypothyroidism, unspecified: Secondary | ICD-10-CM | POA: Diagnosis not present

## 2015-05-24 DIAGNOSIS — I10 Essential (primary) hypertension: Secondary | ICD-10-CM | POA: Diagnosis not present

## 2015-05-24 DIAGNOSIS — E669 Obesity, unspecified: Secondary | ICD-10-CM | POA: Diagnosis not present

## 2015-05-24 DIAGNOSIS — I4891 Unspecified atrial fibrillation: Secondary | ICD-10-CM | POA: Diagnosis not present

## 2015-05-24 DIAGNOSIS — J45909 Unspecified asthma, uncomplicated: Secondary | ICD-10-CM | POA: Diagnosis not present

## 2015-06-06 DIAGNOSIS — E039 Hypothyroidism, unspecified: Secondary | ICD-10-CM | POA: Diagnosis not present

## 2015-06-06 DIAGNOSIS — N3946 Mixed incontinence: Secondary | ICD-10-CM | POA: Diagnosis not present

## 2015-06-06 DIAGNOSIS — R1011 Right upper quadrant pain: Secondary | ICD-10-CM | POA: Diagnosis not present

## 2015-06-06 DIAGNOSIS — I4891 Unspecified atrial fibrillation: Secondary | ICD-10-CM | POA: Diagnosis not present

## 2015-06-20 DIAGNOSIS — N3946 Mixed incontinence: Secondary | ICD-10-CM | POA: Diagnosis not present

## 2015-06-20 DIAGNOSIS — R1011 Right upper quadrant pain: Secondary | ICD-10-CM | POA: Diagnosis not present

## 2015-06-20 DIAGNOSIS — F330 Major depressive disorder, recurrent, mild: Secondary | ICD-10-CM | POA: Diagnosis not present

## 2015-06-23 DIAGNOSIS — K760 Fatty (change of) liver, not elsewhere classified: Secondary | ICD-10-CM | POA: Diagnosis not present

## 2015-06-23 DIAGNOSIS — R194 Change in bowel habit: Secondary | ICD-10-CM | POA: Diagnosis not present

## 2015-06-23 DIAGNOSIS — R1011 Right upper quadrant pain: Secondary | ICD-10-CM | POA: Diagnosis not present

## 2015-06-23 DIAGNOSIS — K5730 Diverticulosis of large intestine without perforation or abscess without bleeding: Secondary | ICD-10-CM | POA: Diagnosis not present

## 2015-06-23 DIAGNOSIS — R109 Unspecified abdominal pain: Secondary | ICD-10-CM | POA: Diagnosis not present

## 2015-06-23 DIAGNOSIS — R635 Abnormal weight gain: Secondary | ICD-10-CM | POA: Diagnosis not present

## 2015-06-30 DIAGNOSIS — I480 Paroxysmal atrial fibrillation: Secondary | ICD-10-CM | POA: Diagnosis not present

## 2015-06-30 DIAGNOSIS — Z7901 Long term (current) use of anticoagulants: Secondary | ICD-10-CM | POA: Diagnosis not present

## 2015-06-30 DIAGNOSIS — E662 Morbid (severe) obesity with alveolar hypoventilation: Secondary | ICD-10-CM | POA: Diagnosis not present

## 2015-06-30 DIAGNOSIS — E8770 Fluid overload, unspecified: Secondary | ICD-10-CM | POA: Diagnosis not present

## 2015-07-26 DIAGNOSIS — I480 Paroxysmal atrial fibrillation: Secondary | ICD-10-CM | POA: Diagnosis not present

## 2015-07-26 DIAGNOSIS — E8770 Fluid overload, unspecified: Secondary | ICD-10-CM | POA: Diagnosis not present

## 2015-07-26 DIAGNOSIS — E662 Morbid (severe) obesity with alveolar hypoventilation: Secondary | ICD-10-CM | POA: Diagnosis not present

## 2015-07-26 DIAGNOSIS — Z7901 Long term (current) use of anticoagulants: Secondary | ICD-10-CM | POA: Diagnosis not present

## 2015-08-04 DIAGNOSIS — I480 Paroxysmal atrial fibrillation: Secondary | ICD-10-CM | POA: Diagnosis not present

## 2015-08-07 NOTE — H&P (Addendum)
OFFICE VISIT NOTES COPIED TO EPIC FOR DOCUMENTATION   [name] 07/26/2015 2:10 PM Location: [facility] Patient #: [id number] DOB: 04/03/1941 Married / Language: English / Race: White Female   History of Present Illness (Jagadeesh R.  MD; 07/26/2015 9:53 PM) Patient words: 3-4 Week F/U for A-Fib; Pt has had a pinching feeling in her Chest that seems to be getting more frequent. She states that she has less energy & more SOB.  The patient is a 74 year old female, unaccompanied during the visit, who presents for a Follow-up for Atrial fibrillation. [name] is a very pleasant 74-year-old Caucasian female with history of paroxysmal atrial fibrillation. No bleeding diathesis on Eliquis otherwise tolerating all the medications, no dizziness or syncope. No palpitations.  I had seen her about 4 weeks ago, at that time she has been asymptomatic relatively speaking with regard to recurrence of atrial fibrillation. I suspect contemplating discontinuation of flecainide and she presents here for follow-up EKG and office visit, felt that atrial fibrillation could have been paroxysmal.  Since then she has noticed marked dyspnea, fatigue and not feeling well. She has obstructive sleep apnea and uses CPAP on a regular basis.   Problem List/Past Medical (April Garrison; 07/26/2015 2:16 PM) Paroxysmal atrial fibrillation (I48.0) Cardioversion 06/09/2013: A. Fib to NSR. Cardioversion 02/05/12: A. Fibrillation to NSR. But patient was back in A. Fibrillation at follow-up. 2-D echo on 01/12/14: The study was technically difficult due to body habitus. Left ventricle: The cavity size was mildly dilated. Wall thickness was increased in a pattern of mild LVH. Systolic function was normal. The estimated ejection fraction was in the range of 55% to 60%. Images were inadequate for LV wall motion assessment. Features are consistent with a pseudonormal left ventricular filling pattern, with concomitant  abnormal relaxation and increased filling pressure (grade 2 diastolic dysfunction). Doppler parameters are consistent with high ventricular filling pressure. Lexiscan stress test 01/11/12: Gut uptake artifact. No definate ischemia. EF 53%. Low risk. Essential hypertension, benign (I10) Labs 02/21/2015: Serum glucose 103, creatinine 0.75, CMP otherwise normal Labs 10/12/2014: Thyroid panel normal, creatinine 0.71, CMP normal Obstructive sleep apnea (G47.33) Married. Compliant and on CPAP. Uses daily. Other dyspnea and respiratory abnormality (R06.09, R09.89) Follows Dr Michael Wert 05/04/2014: Bronchial asthma and GERD. 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 Fibrillation to NSR. But patient was back in A. Fibrillation at follow-up. Cardioversion 06/09/2013: A. Fib to NSR. Polyuria (R35.8) Startes since cardioversion Long-term (current) use of anticoagulants (Z79.01)  Allergies (April Garrison; 07/26/2015 2:11 PM) Demerol *ANALGESICS - OPIOID* Anaphylaxis. Statins Depletion *DIETARY PRODUCTS/DIETARY MANAGEMENT PRODUCTS* muscle problems, with ipitor, Zocor gave her liver problems  Family History (April Garrison; 07/26/2015 2:11 PM) Sister 2 1 yr Older; 1-Deceased Brother 5 1-Older; 2-Younger; 2-Deceased Mother Deceased. at age 80 from Colorectal Cancer; MI at age 70 Father Deceased. at age 64 from a Brain Aneurysm  Social History (April Garrison; 07/26/2015 2:11 PM) Current tobacco use Former smoker. Quit smoking cigarettes in 1990 Non Drinker/No Alcohol Use Number of Children 2. Marital status Married. Living Situation Lives with spouse.  Past Surgical History (April Garrison; 07/26/2015 2:11 PM) Partial hysterectomy/bladder repair 1982 Cholecystectomy10/07/2012 Gallstone removed 09/2012  Medication History (April Garrison; 07/26/2015  2:18 PM) Eliquis (5MG Tablet, 1 (one) Tablet Oral two times daily, Taken starting 07/10/2015) Active. Flecainide Acetate (100MG Tablet, 1 Tablet Tablet Oral two times daily, Taken starting 03/13/2015) Active. LORazepam (1MG   Tablet, 1.5 Oral three times daily) Active. NexIUM (40MG Capsule DR, 1 Oral daily as needed) Active. FLUoxetine HCl (20MG Capsule, 3 Oral once a day) Active. Nasonex (50MCG/ACT Suspension, 1 puff in each nostril Nasal at bedtime as needed) Active. Colcrys (0.6MG Tablet, 1 Oral as needed for Gout) Active. Monostat cream (as needed (External)) Active. Levothyroxine Sodium (50MCG Tablet, 1 Oral daily) Active. Claritin (10MG Tablet, 1 Oral daily as needed) Active. Albuterol Sulfate (0.63MG/3ML Nebulized Soln, Inhalation prn) Active. InnoPran XL (80MG Capsule ER 24HR, 1 Oral daily) Active. Proventil HFA (108 (90 Base)MCG/ACT Aerosol Soln, 2 puffs Inhalation as needed) Active. Torsemide (10MG Tablet, 1 Oral as needed) Active. Klor-Con 10 (1 Oral as needed with Torsemide) Specific dose unknown - Active. Medications Reconciled  Diagnostic Studies History (April Garrison; 07/26/2015 2:11 PM) Colonoscopy 6/11 (benign polyps removed).  Sleep study 2009 (Dx'd with sleep apnea-uses CPAP and oxygen at night) ECG 02/20/12: A. Fibrillatin with controlled V rate of 75/min. Non sp T change. Low voltage. ECG 01/18/12 coarse afib 90/min, poor R wave progression, no ischemia.  Echo 01/09/12; Normal LVEF, severe biatrial enalargement. Mild pulmonary hypertension. Lexiscan stressx 01/11/12: Gut uptake artifact. No definate ischemia. EF 53%. Low risk. Nuclear stress test 10/08: Inferior and inferolateral scar and ischemia. Diaphragmatic attenuation. Echocardiogram Hospital Echocardiogram 01/12/2014: Suboptimal echo window, left ventricle mildly dilated. Mild LVH. EF normal at 55-60%. Grade 2 diastolic dysfunction. Severely enlarged left atrium. Mildly dilated right ventricle  with preserved right ventricular systolic function. IVC was mildly dilated. No significant valvular abnormalities. Echo 04/14/2013: 1. Left ventricular cavity is normal in size. Mild to moderate concentric hypertrophy. Normal global wall motion. Normal systolic global function. Calculated EF 61%. 2. Left atrial cavity is moderately dilated. Right atrial cavity is moderately dilated. 3. Right ventricular cavity is mildly enlarged. Normal global wall motion. 4. Mitral valve structurally normal. Trace mitral regurgitation. 5. Tricuspid valve structurally normal. Trace tricuspid regurgitation. 2-D echo on 01/12/14:  Hospital Echocardiogram 01/12/2014: Suboptimal echo window, left ventricle mildly dilated. Mild LVH. EF normal at 55-60%. Grade 2 diastolic dysfunction. Severely enlarged left atrium. Mildly dilated right ventricle with preserved right ventricular systolic function. IVC was mildly dilated. No significant valvular abnormalities. Nuclear stress test06/23/2014 Lexiscan Myoview stress test: 1. Resting EKG demonstrates atrial fibrillation with controlled ventricular response. Low voltage complexes. Nonspecific ST-T wave changes.. No ST-T changes of ischemia noted with pharmacologic stress testing. Stress symptoms included dyspnea, lightheadedness and slight chest pressure. Stress terminated due to completion of protocol. 2. Perfusion imaging study demonstrated mild soft tissue attenuation consistent with breast attenuation. There was no e/o ischemia or scar. The left ventricular systolic function was normal. This is a low risk study.  Other Problems (April Garrison; 07/26/2015 2:11 PM) Cardioversion external (92960) Cardioversion 02/05/12: A. Fibrillation to NSR. But patient was back in A. Fibrillation at follow-up. Cardioversion 06/09/2013: A. Fib to NSR. Acute bronchitis and bronchiolitis (J20.9, J21.9) Hospitalized in 1995 for kidney stones/infection and sepsis.    Review of Systems (Jagadeesh R.   MD; 07/26/2015 3:01 PM) General Present- Fatigue. Not Present- Anorexia and Fever. Respiratory Present- Decreased Exercise Tolerance (stable), Difficulty Breathing on Exertion (stable) and Snoring. Not Present- Bloody sputum and Wakes up from Sleep Wheezing or Short of Breath. Cardiovascular Not Present- Chest Pain, Claudications, Orthopnea, Paroxysmal Nocturnal Dyspnea and Shortness of Breath. Gastrointestinal Not Present- Change in Bowel Habits, Constipation and Nausea. Musculoskeletal Present- Joint Pain (diffuse). Not Present- Leg Cramps and Leg Weakness. Neurological Not Present- Focal Neurological Symptoms. Endocrine Not Present- Appetite Changes,   Cold Intolerance and Heat Intolerance. Hematology Not Present- Anemia, Petechiae and Prolonged Bleeding.  Vitals (April Garrison; 07/26/2015 2:22 PM) 07/26/2015 2:12 PM Weight: 311.19 lb Height: 62in Body Surface Area: 2.31 m Body Mass Index: 56.92 kg/m  Pulse: 86 (Regular)  P.OX: 96% (Room air) BP: 132/82 (Sitting, Left Arm, Standard)       Physical Exam Laverda Page MD; 07/26/2015 3:00 PM) General Mental Status-Alert. General Appearance-Cooperative, Appears stated age, Not in acute distress. Orientation-Oriented X3. Build & Nutrition-Well built and Morbidly obese.  Head and Neck Thyroid Gland Characteristics - no palpable nodules, no palpable enlargement.  Chest and Lung Exam Palpation Tender - No chest wall tenderness. Auscultation Breath sounds - Clear.  Cardiovascular Inspection Jugular vein - Right - No Distention. Auscultation Heart Sounds - S1 is variable, S2 WNL and No gallop present. Murmurs & Other Heart Sounds - Murmur - No murmur.  Abdomen Inspection Contour - Obese and Pannus present. Palpation/Percussion Normal exam - Non Tender and No hepatosplenomegaly. Auscultation Normal exam - Bowel sounds normal.  Peripheral Vascular Lower Extremity Inspection - Left - No  Pigmentation, No Varicose veins. Right - No Pigmentation, No Varicose veins. Palpation - Edema - Bilateral - 1+ Pitting edema. Femoral pulse - Bilateral - Absent(Pulsus difficult to feel due to patient's bodily habitus.). Popliteal pulse - Bilateral - 0+(Pulsus difficult to feel due to patient's bodily habitus.). Dorsalis pedis pulse - Bilateral - Normal. Posterior tibial pulse - Bilateral - Normal. Carotid arteries - Bilateral-No Carotid bruit. Abdomen-No prominent abdominal aortic pulsation, No epigastric bruit.  Musculoskeletal - Did not examine.    Assessment & Plan Laverda Page MD; 07/26/2015 3:04 PM) Paroxysmal atrial fibrillation (I48.0) Story: Cardioversion 06/09/2013: A. Fib to NSR. Cardioversion 02/05/12: A. Fibrillation to NSR. But patient was back in A. Fibrillation at follow-up.  2-D echo on 01/12/14: The study was technically difficult due to body habitus. Left ventricle: The cavity size was mildly dilated. Wall thickness was increased in a pattern of mild LVH. Systolic function was normal. The estimated ejection fraction was in the range of 55% to 60%. Images were inadequate for LV wall motion assessment. Features are consistent with a pseudonormal left ventricular filling pattern, with concomitant abnormal relaxation and increased filling pressure (grade 2 diastolic dysfunction). Doppler parameters are consistent with high ventricular filling pressure.  Lexiscan stress test 01/11/12: Gut uptake artifact. No definate ischemia. EF 53%. Low risk. Impression: EKG 07/26/2015: Atrial fibrillation with controlled response at the rate of 59 bpm, left axis deviation, left anterior fascicular. Incomplete right bundle branch block, poor R-wave progression, cannot exclude anterior infarct old. Nonspecific T abnormality. No significant change from EKG 06/30/2015.  EKG 12/29/2014: Sinus rhythm/sinus bradycardia at the rate of 50 bpm with first-degree AV block, left atrial enlargement,  leftward axis. Low voltage complexes. Incomplete right bundle branch block. No change 06/25/2014. 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 I discussed primary/secondary prevention and also dietary counceling was done. Patient presents with worsening symptoms of fatigue, dyspnea and lack of energy. I suspect atrial fibrillation could be contributing to this. I have continued flecainide for now, I will set her up for cardioversion and see whether this would make a difference.  With regard to weight loss, we had a long discussion. Low-calorie diet and low carbohydrate  diet was discussed, she states that she is now motivated to lose weight. She will do calorie count for a week to 10 days and bring it to [place] for evaluation.    Signed by Laverda Page, MD (07/26/2015 9:54 PM)  BMP 08/04/2015: Serum glucose 103 mg, BUN 20, serum creatinine 0.81.  Potassium 4.9.  EGFR 72 mL.

## 2015-08-09 ENCOUNTER — Ambulatory Visit (HOSPITAL_COMMUNITY)
Admission: RE | Admit: 2015-08-09 | Discharge: 2015-08-09 | Disposition: A | Discharge status: Home / Self Care | Payer: Medicare Other | Source: Ambulatory Visit | Attending: Cardiology | Admitting: Cardiology

## 2015-08-09 ENCOUNTER — Ambulatory Visit (HOSPITAL_COMMUNITY): Payer: Medicare Other | Admitting: Certified Registered Nurse Anesthetist

## 2015-08-09 ENCOUNTER — Encounter (HOSPITAL_COMMUNITY)
Admission: RE | Disposition: A | Discharge status: Home / Self Care | Payer: Self-pay | Source: Ambulatory Visit | Attending: Cardiology

## 2015-08-09 ENCOUNTER — Encounter (HOSPITAL_COMMUNITY): Payer: Self-pay | Admitting: *Deleted

## 2015-08-09 DIAGNOSIS — E039 Hypothyroidism, unspecified: Secondary | ICD-10-CM | POA: Insufficient documentation

## 2015-08-09 DIAGNOSIS — G4733 Obstructive sleep apnea (adult) (pediatric): Secondary | ICD-10-CM | POA: Insufficient documentation

## 2015-08-09 DIAGNOSIS — Z6841 Body Mass Index (BMI) 40.0 and over, adult: Secondary | ICD-10-CM | POA: Insufficient documentation

## 2015-08-09 DIAGNOSIS — M797 Fibromyalgia: Secondary | ICD-10-CM | POA: Diagnosis not present

## 2015-08-09 DIAGNOSIS — I10 Essential (primary) hypertension: Secondary | ICD-10-CM | POA: Insufficient documentation

## 2015-08-09 DIAGNOSIS — I4891 Unspecified atrial fibrillation: Secondary | ICD-10-CM | POA: Diagnosis not present

## 2015-08-09 DIAGNOSIS — Z87891 Personal history of nicotine dependence: Secondary | ICD-10-CM | POA: Insufficient documentation

## 2015-08-09 DIAGNOSIS — I480 Paroxysmal atrial fibrillation: Secondary | ICD-10-CM | POA: Diagnosis not present

## 2015-08-09 DIAGNOSIS — I739 Peripheral vascular disease, unspecified: Secondary | ICD-10-CM | POA: Diagnosis not present

## 2015-08-09 HISTORY — PX: CARDIOVERSION: SHX1299

## 2015-08-09 SURGERY — CARDIOVERSION
Anesthesia: Monitor Anesthesia Care

## 2015-08-09 MED ORDER — LIDOCAINE HCL (CARDIAC) 20 MG/ML IV SOLN
INTRAVENOUS | Status: DC | PRN
  Administered 2015-08-09: 20 mg via INTRAVENOUS

## 2015-08-09 MED ORDER — PROPOFOL 10 MG/ML IV BOLUS
INTRAVENOUS | Status: DC | PRN
  Administered 2015-08-09: 70 mg via INTRAVENOUS

## 2015-08-09 MED ORDER — SODIUM CHLORIDE 0.9 % IV SOLN
INTRAVENOUS | Status: DC
  Administered 2015-08-09: 12:00:00 via INTRAVENOUS

## 2015-08-09 NOTE — Discharge Instructions (Signed)

## 2015-08-09 NOTE — Transfer of Care (Signed)
Immediate Anesthesia Transfer of Care Note  Patient: [name]  Procedure(s) Performed: Procedure(s): CARDIOVERSION (N/A)  Patient Location: Endoscopy Unit  Anesthesia Type:MAC  Level of Consciousness: awake, alert  and oriented  Airway & Oxygen Therapy: Patient Spontanous Breathing and Patient connected to nasal cannula oxygen  Post-op Assessment: Report given to RN and Post -op Vital signs reviewed and stable  Post vital signs: Reviewed and stable  Last Vitals:  Filed Vitals:   08/09/15 1149  BP: 139/61  Pulse: 68  Resp: 14    Complications: No apparent anesthesia complications

## 2015-08-09 NOTE — Interval H&P Note (Signed)
History and Physical Interval Note:  08/09/2015 12:17 PM  [name]  has presented today for surgery, with the diagnosis of afib  The various methods of treatment have been discussed with the patient and family. After consideration of risks, benefits and other options for treatment, the patient has consented to  Procedure(s): CARDIOVERSION (N/A) as a surgical intervention .  The patient's history has been reviewed, patient examined, no change in status, stable for surgery.  I have reviewed the patient's chart and labs.  Questions were answered to the patient's satisfaction.     Adrian Prows

## 2015-08-09 NOTE — Anesthesia Preprocedure Evaluation (Addendum)
Anesthesia Evaluation  Patient identified by MRN, date of birth, ID band Patient awake    Reviewed: Allergy & Precautions, NPO status , Patient's Chart, lab work & pertinent test results  History of Anesthesia Complications Negative for: history of anesthetic complications  Airway Mallampati: I  TM Distance: >3 FB Neck ROM: Full    Dental  (+) Upper Dentures, Dental Advisory Given   Pulmonary shortness of breath and with exertion, asthma , sleep apnea and Continuous Positive Airway Pressure Ventilation , pneumonia, resolved, former smoker,    breath sounds clear to auscultation       Cardiovascular hypertension, Pt. on medications and Pt. on home beta blockers + Peripheral Vascular Disease and +CHF  + dysrhythmias Atrial Fibrillation  Rhythm:Irregular Rate:Normal  Echo 01/12/14 EF 55-60%   Neuro/Psych  Headaches, Anxiety Depression  Neuromuscular disease    GI/Hepatic Neg liver ROS, GERD  Medicated and Controlled,  Endo/Other  Hypothyroidism Morbid obesity  Renal/GU negative Renal ROS     Musculoskeletal  (+) Arthritis , Osteoarthritis,  Fibromyalgia -  Abdominal (+) + obese,   Peds  Hematology   Anesthesia Other Findings   Reproductive/Obstetrics                           Anesthesia Physical Anesthesia Plan  ASA: III  Anesthesia Plan: MAC   Post-op Pain Management:    Induction: Intravenous  Airway Management Planned: Mask  Additional Equipment:   Intra-op Plan:   Post-operative Plan:   Informed Consent: I have reviewed the patients History and Physical, chart, labs and discussed the procedure including the risks, benefits and alternatives for the proposed anesthesia with the patient or authorized representative who has indicated his/her understanding and acceptance.   Dental advisory given  Plan Discussed with: CRNA, Anesthesiologist and Surgeon  Anesthesia Plan Comments:  (Plan routine monitors, MAC for cardioversion)       Anesthesia Quick Evaluation

## 2015-08-09 NOTE — Anesthesia Postprocedure Evaluation (Signed)
  Anesthesia Post-op Note  Patient: [name]  Procedure(s) Performed: Procedure(s): CARDIOVERSION (N/A)  Patient Location: Endoscopy Unit  Anesthesia Type:MAC  Level of Consciousness: awake, alert  and oriented  Airway and Oxygen Therapy: Patient Spontanous Breathing and Patient connected to nasal cannula oxygen  Post-op Pain: none  Post-op Assessment: Post-op Vital signs reviewed, Patient's Cardiovascular Status Stable and Respiratory Function Stable              Post-op Vital Signs: Reviewed and stable  Last Vitals:  Filed Vitals:   08/09/15 1149  BP: 139/61  Pulse: 68  Resp: 14    Complications: No apparent anesthesia complications

## 2015-08-09 NOTE — CV Procedure (Signed)
Direct current cardioversion:  Indication symptomatic A. Fibrillation.  Procedure: Using 70 mg of IV Propofol and 20 IV Lidocaine (for reducing venous pain) for achieving deep sedation, synchronized direct current cardioversion performed. Patient was delivered with 150 Joules of electricity X 1 with success to NSR. Patient tolerated the procedure well. No immediate complication noted.

## 2015-08-10 ENCOUNTER — Encounter (HOSPITAL_COMMUNITY): Payer: Self-pay | Admitting: Cardiology

## 2015-08-18 DIAGNOSIS — E662 Morbid (severe) obesity with alveolar hypoventilation: Secondary | ICD-10-CM | POA: Diagnosis not present

## 2015-08-18 DIAGNOSIS — Z9889 Other specified postprocedural states: Secondary | ICD-10-CM | POA: Diagnosis not present

## 2015-08-18 DIAGNOSIS — Z7901 Long term (current) use of anticoagulants: Secondary | ICD-10-CM | POA: Diagnosis not present

## 2015-08-18 DIAGNOSIS — I480 Paroxysmal atrial fibrillation: Secondary | ICD-10-CM | POA: Diagnosis not present

## 2015-09-07 DIAGNOSIS — I480 Paroxysmal atrial fibrillation: Secondary | ICD-10-CM | POA: Diagnosis not present

## 2015-09-12 NOTE — H&P (Signed)
OFFICE VISIT NOTES COPIED TO EPIC FOR DOCUMENTATION  [name] 08/18/2015 8:18 AM Location: [facility] Patient #: [id number] DOB: 18-Jan-1941 Married / Language: Cleophus Molt / Race: White Female   History of Present Illness Laverda Page MD; 08/18/2015 12:57 PM) Patient words: 7-10 day f/u Cardioversion; Pt states after the Cardioversion for several days she couldnt breath well when trying to walk. She experienced a restless night, and was hot and sweaty, and felt jittery. Pt said she had edema in upper legs, and stomach.  The patient is a 74 year old female who presents for a follow-up for Atrial fibrillation. She has history of paroxysmal atrial fibrillation. No bleeding diathesis on Eliquis otherwise tolerating all the medications, no dizziness or syncope. No palpitations.  She underwent direct current cardioversion due to symptomatic atrial fibrillation, was successful with one shock to convert to sinus rhythm. However patient states that she has not felt well and has same symptoms that she had previously with marked fatigue and dyspnea and leg edema.  She has obstructive sleep apnea and uses CPAP on a regular basis.   Problem List/Past Medical Adonis Brook Beane; 08/18/2015 11:53 AM) Paroxysmal atrial fibrillation (I48.0) Cardioversion 08/09/2015: 150J A. Fib to NSR Cardioversion 06/09/2013: A. Fib to NSR on Flecainide. Cardioversion 02/05/12: A. Fibrillation to NSR. But patient was back in A. Fibrillation at follow-up. 2-D echo on 01/12/14: The study was technically difficult due to body habitus. Left ventricle: The cavity size was mildly dilated. Wall thickness was increased in a pattern of mild LVH. Systolic function was normal. The estimated ejection fraction was in the range of 55% to 60%. Images were inadequate for LV wall motion assessment. Features are consistent with a pseudonormal left ventricular filling pattern, with concomitant abnormal relaxation and increased filling  pressure (grade 2 diastolic dysfunction). Doppler parameters are consistent with high ventricular filling pressure. Lexiscan stress test 01/11/12: Gut uptake artifact. No definate ischemia. EF 53%. Low risk. Essential hypertension, benign (I10) BMP 08/04/2015: Serum glucose 103 mg, BUN 20, serum creatinine 0.81. Potassium 4.9. EGFR 72 mL. Obstructive sleep apnea (G47.33) Married. Compliant and on CPAP. Uses daily. Other dyspnea and respiratory abnormality (R06.09, R09.89) Follows Dr Christinia Gully 05/04/2014: Bronchial asthma and GERD. 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 Fibrillation to NSR. But patient was back in A. Fibrillation at follow-up. Cardioversion 06/09/2013: A. Fib to NSR. Polyuria (R35.8) Startes since cardioversion Long-term (current) use of anticoagulants (Z79.01)  Allergies (Christie Beane; 08/18/2015 11:53 AM) Demerol *ANALGESICS - OPIOID* Anaphylaxis. Statins Depletion *DIETARY PRODUCTS/DIETARY MANAGEMENT PRODUCTS* muscle problems, with ipitor, Zocor gave her liver problems  Family History Adonis Brook Beane; 08/18/2015 11:53 AM) Sister 2 1 yr Older; 1-Deceased Brother 74 1-Older; 2-Younger; 2-Deceased Mother Deceased. at age 50 from Colorectal Cancer; MI at age 50 Father Deceased. at age 87 from a Brain Aneurysm  Social History Adonis Brook Beane; 08/18/2015 11:53 AM) Current tobacco use Former smoker. Quit smoking cigarettes in 1990 Non Drinker/No Alcohol Use Number of Children 2. Marital status Married. Living Situation Lives with spouse.  Past Surgical History Adonis Brook Beane; 08/18/2015 11:53 AM) Partial hysterectomy/bladder repair 1982 Cholecystectomy10/07/2012 Gallstone removed 09/2012  Medication History Adonis Brook Beane; 08/18/2015 11:53 AM) Eliquis (5MG Tablet, 1 (one) Tablet Tablet Oral two times daily, Taken  starting 07/10/2015) Active. Flecainide Acetate (100MG Tablet, 1 Tablet Tablet Tablet Oral two times daily, Taken starting 03/13/2015) Active. LORazepam (1MG Tablet, 1.5 Oral three times daily) Active. NexIUM (40MG  Capsule DR, 1 Oral daily as needed) Active. FLUoxetine HCl (20MG  Capsule, 3 Oral once a day) Active. Nasonex (50MCG/ACT Suspension, 1 puff in each nostril Nasal at bedtime as needed) Active. Colcrys (0.6MG  Tablet, 1 Oral as needed for Gout) Active. Monostat cream (as needed (External)) Active. Levothyroxine Sodium (50MCG Tablet, 1 Oral daily) Active. Claritin (10MG  Tablet, 1 Oral daily as needed) Active. Albuterol Sulfate (0.63MG /3ML Nebulized Soln, Inhalation prn) Active. InnoPran XL (80MG  Capsule ER 24HR, 1 Oral daily) Active. Proventil HFA (108 (90 Base)MCG/ACT Aerosol Soln, 2 puffs Inhalation as needed) Active. Torsemide (10MG  Tablet, 1 Oral as needed) Active. Klor-Con 10 (1 Oral as needed with Torsemide) Specific dose unknown - Active. Medications Reconciled  Diagnostic Studies History Adonis Brook Beane; 30-Aug-2015 11:55 AM) Cardioversion09/13/2016 150J A. Fib to NSR Lexiscan stressx 01/11/12: Gut uptake artifact. No definate ischemia. EF 53%. Low risk. Echo 01/09/12; Normal LVEF, severe biatrial enalargement. Mild pulmonary hypertension. Nuclear stress test 10/08: Inferior and inferolateral scar and ischemia. Diaphragmatic attenuation. 2-D echo on 01/12/14:  Hospital Echocardiogram 01/12/2014: Suboptimal echo window, left ventricle mildly dilated. Mild LVH. EF normal at 55-60%. Grade 2 diastolic dysfunction. Severely enlarged left atrium. Mildly dilated right ventricle with preserved right ventricular systolic function. IVC was mildly dilated. No significant valvular abnormalities. Echocardiogram Hospital Echocardiogram 01/12/2014: Suboptimal echo window, left ventricle mildly dilated. Mild LVH. EF normal at 55-60%. Grade 2 diastolic dysfunction. Severely enlarged  left atrium. Mildly dilated right ventricle with preserved right ventricular systolic function. IVC was mildly dilated. No significant valvular abnormalities. Echo 04/14/2013: 1. Left ventricular cavity is normal in size. Mild to moderate concentric hypertrophy. Normal global wall motion. Normal systolic global function. Calculated EF 61%. 2. Left atrial cavity is moderately dilated. Right atrial cavity is moderately dilated. 3. Right ventricular cavity is mildly enlarged. Normal global wall motion. 4. Mitral valve structurally normal. Trace mitral regurgitation. 5. Tricuspid valve structurally normal. Trace tricuspid regurgitation. ECG 01/18/12 coarse afib 90/min, poor R wave progression, no ischemia.  Colonoscopy 6/11 (benign polyps removed).  Sleep study 2009 (Dx'd with sleep apnea-uses CPAP and oxygen at night) ECG 02/20/12: A. Fibrillatin with controlled V rate of 75/min. Non sp T change. Low voltage. Nuclear stress test06/23/2014 Lexiscan Myoview stress test: 1. Resting EKG demonstrates atrial fibrillation with controlled ventricular response. Low voltage complexes. Nonspecific ST-T wave changes.. No ST-T changes of ischemia noted with pharmacologic stress testing. Stress symptoms included dyspnea, lightheadedness and slight chest pressure. Stress terminated due to completion of protocol. 2. Perfusion imaging study demonstrated mild soft tissue attenuation consistent with breast attenuation. There was no e/o ischemia or scar. The left ventricular systolic function was normal. This is a low risk study.  Other Problems Adonis Brook Beane; 2015/08/30 11:53 AM) Cardioversion external [phone]) Cardioversion 02/05/12: A. Fibrillation to NSR. But patient was back in A. Fibrillation at follow-up. Cardioversion 06/09/2013: A. Fib to NSR. Acute bronchitis and bronchiolitis (J20.9, J21.9) Hospitalized in 1995 for kidney stones/infection and sepsis.    Review of Systems Laverda Page MD; 08-30-2015 12:57  PM) General Present- Fatigue. Not Present- Anorexia and Fever. Respiratory Present- Decreased Exercise Tolerance (stable), Difficulty Breathing on Exertion (stable) and Snoring. Not Present- Bloody sputum and Wakes up from Sleep Wheezing or Short of Breath. Cardiovascular Not Present- Chest Pain, Claudications, Orthopnea, Paroxysmal Nocturnal Dyspnea and Shortness of Breath. Gastrointestinal Not Present- Change in Bowel Habits, Constipation and Nausea. Female Genitourinary Present- Frequency (hronic and worsening), Stress Incontinence and Urgency. Musculoskeletal Present- Joint Pain (diffuse). Not Present- Leg Cramps and Leg Weakness. Neurological Not Present-  Focal Neurological Symptoms. Endocrine Not Present- Appetite Changes, Cold Intolerance and Heat Intolerance. Hematology Not Present- Anemia, Petechiae and Prolonged Bleeding.  Vitals Adonis Brook Beane; 08/18/2015 12:04 PM) 08/18/2015 11:53 AM Weight: 309.31 lb Height: 62in Body Surface Area: 2.3 m Body Mass Index: 56.57 kg/m  Pulse: 63 (Regular)  P.OX: 96% (Room air) BP: 122/84 (Sitting, Left Arm, Standard)       Physical Exam Laverda Page, MD; 08/18/2015 12:58 PM) General Mental Status-Alert. General Appearance-Cooperative, Appears stated age, Not in acute distress. Orientation-Oriented X3. Build & Nutrition-Well built and Morbidly obese.  Head and Neck Thyroid Gland Characteristics - no palpable nodules, no palpable enlargement.  Chest and Lung Exam Palpation Tender - No chest wall tenderness. Auscultation Breath sounds - Clear.  Cardiovascular Inspection Jugular vein - Right - No Distention. Auscultation Heart Sounds - S1 is variable, S2 WNL and No gallop present. Murmurs & Other Heart Sounds - Murmur - No murmur.  Abdomen Inspection Contour - Obese and Pannus present. Palpation/Percussion Normal exam - Non Tender and No hepatosplenomegaly. Auscultation Normal exam - Bowel sounds  normal.  Peripheral Vascular Lower Extremity Inspection - Left - No Pigmentation, No Varicose veins. Right - No Pigmentation, No Varicose veins. Palpation - Edema - Bilateral - 1+ Pitting edema. Femoral pulse - Bilateral - Absent(Pulsus difficult to feel due to patient's bodily habitus.). Popliteal pulse - Bilateral - 0+(Pulsus difficult to feel due to patient's bodily habitus.). Dorsalis pedis pulse - Bilateral - Normal. Posterior tibial pulse - Bilateral - Normal. Carotid arteries - Bilateral-No Carotid bruit. Abdomen-No prominent abdominal aortic pulsation, No epigastric bruit.  Musculoskeletal - Did not examine.    Assessment & Plan Laverda Page MD; 08/18/2015 12:58 PM) Paroxysmal atrial fibrillation (I48.0) Story: Cardioversion 08/09/2015: 150J A. Fib to NSR Cardioversion 06/09/2013: A. Fib to NSR on Flecainide. Cardioversion 02/05/12: A. Fibrillation to NSR. But patient was back in A. Fibrillation at follow-up.  2-D echo on 01/12/14: The study was technically difficult due to body habitus. Left ventricle: The cavity size was mildly dilated. Wall thickness was increased in a pattern of mild LVH. Systolic function was normal. The estimated ejection fraction was in the range of 55% to 60%. Images were inadequate for LV wall motion assessment. Features are consistent with a pseudonormal left ventricular filling pattern, with concomitant abnormal relaxation and increased filling pressure (grade 2 diastolic dysfunction). Doppler parameters are consistent with high ventricular filling pressure.  Lexiscan stress test 01/11/12: Gut uptake artifact. No definate ischemia. EF 53%. Low risk. Impression: EKG 08/18/2015: Atrial flutter ablation with controlled response at the rate of 74 bpm, left axis deviation, left anterior fascicular block. Incomplete right branch block. Or R-wave progression, pulmonary disease pattern. Low-voltage complexes. No significant change from EKG 07/26/2015  EKG  12/29/2014: Sinus rhythm/sinus bradycardia at the rate of 50 bpm with first-degree AV block, left atrial enlargement, leftward axis. Low voltage complexes. Incomplete right bundle branch block. No change 06/25/2014. Current Plans Complete electrocardiogram (93000) Discontinued Flecainide Acetate 100MG  (Increase to 150 mg). Started Flecainide Acetate 150MG , 1 (one) Tablet two times daily, #60, 08/18/2015, Ref. x1. 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 I discussed primary/secondary prevention and also dietary counceling was done. Patient underwent cardioversion but unfortunately his back in atrial fibrillation. I had a long discussion with the  patient regarding again rhythm control versus rate control, patient markedly fatigued and dyspneic, hence we discussed options of changing to different agent versus increasing the dose of flecainide. As patient has done well on flecainide, I will increase it to 150 mg by mouth twice a day, we will reattempt cardioversion. If this fails, her options will be either trying sotalol or amiodarone or dofetilide.  Her other main complaint today was frequency in urination, has become associated stigmata. She may benefit from urology opinion.    Signed by Laverda Page, MD (08/18/2015 12:59 PM)

## 2015-09-13 ENCOUNTER — Ambulatory Visit (HOSPITAL_COMMUNITY): Payer: Medicare Other | Admitting: Anesthesiology

## 2015-09-13 ENCOUNTER — Ambulatory Visit (HOSPITAL_COMMUNITY)
Admission: RE | Admit: 2015-09-13 | Discharge: 2015-09-13 | Disposition: A | Discharge status: Home / Self Care | Payer: Medicare Other | Source: Ambulatory Visit | Attending: Cardiology | Admitting: Cardiology

## 2015-09-13 ENCOUNTER — Encounter (HOSPITAL_COMMUNITY)
Admission: RE | Disposition: A | Discharge status: Home / Self Care | Payer: Self-pay | Source: Ambulatory Visit | Attending: Cardiology

## 2015-09-13 ENCOUNTER — Encounter (HOSPITAL_COMMUNITY): Payer: Self-pay | Admitting: *Deleted

## 2015-09-13 DIAGNOSIS — I110 Hypertensive heart disease with heart failure: Secondary | ICD-10-CM | POA: Diagnosis not present

## 2015-09-13 DIAGNOSIS — Z6841 Body Mass Index (BMI) 40.0 and over, adult: Secondary | ICD-10-CM | POA: Insufficient documentation

## 2015-09-13 DIAGNOSIS — J45909 Unspecified asthma, uncomplicated: Secondary | ICD-10-CM | POA: Diagnosis not present

## 2015-09-13 DIAGNOSIS — Z7982 Long term (current) use of aspirin: Secondary | ICD-10-CM | POA: Diagnosis not present

## 2015-09-13 DIAGNOSIS — I272 Other secondary pulmonary hypertension: Secondary | ICD-10-CM | POA: Diagnosis not present

## 2015-09-13 DIAGNOSIS — Z888 Allergy status to other drugs, medicaments and biological substances status: Secondary | ICD-10-CM | POA: Diagnosis not present

## 2015-09-13 DIAGNOSIS — E662 Morbid (severe) obesity with alveolar hypoventilation: Secondary | ICD-10-CM | POA: Insufficient documentation

## 2015-09-13 DIAGNOSIS — F329 Major depressive disorder, single episode, unspecified: Secondary | ICD-10-CM | POA: Diagnosis not present

## 2015-09-13 DIAGNOSIS — F419 Anxiety disorder, unspecified: Secondary | ICD-10-CM | POA: Diagnosis not present

## 2015-09-13 DIAGNOSIS — Z7901 Long term (current) use of anticoagulants: Secondary | ICD-10-CM | POA: Diagnosis not present

## 2015-09-13 DIAGNOSIS — I4510 Unspecified right bundle-branch block: Secondary | ICD-10-CM | POA: Diagnosis not present

## 2015-09-13 DIAGNOSIS — K219 Gastro-esophageal reflux disease without esophagitis: Secondary | ICD-10-CM | POA: Insufficient documentation

## 2015-09-13 DIAGNOSIS — I739 Peripheral vascular disease, unspecified: Secondary | ICD-10-CM | POA: Diagnosis not present

## 2015-09-13 DIAGNOSIS — Z87891 Personal history of nicotine dependence: Secondary | ICD-10-CM | POA: Insufficient documentation

## 2015-09-13 DIAGNOSIS — I440 Atrioventricular block, first degree: Secondary | ICD-10-CM | POA: Insufficient documentation

## 2015-09-13 DIAGNOSIS — I480 Paroxysmal atrial fibrillation: Secondary | ICD-10-CM | POA: Insufficient documentation

## 2015-09-13 DIAGNOSIS — E039 Hypothyroidism, unspecified: Secondary | ICD-10-CM | POA: Diagnosis not present

## 2015-09-13 DIAGNOSIS — I509 Heart failure, unspecified: Secondary | ICD-10-CM | POA: Insufficient documentation

## 2015-09-13 DIAGNOSIS — I4892 Unspecified atrial flutter: Secondary | ICD-10-CM | POA: Insufficient documentation

## 2015-09-13 DIAGNOSIS — Z885 Allergy status to narcotic agent status: Secondary | ICD-10-CM | POA: Diagnosis not present

## 2015-09-13 DIAGNOSIS — I4891 Unspecified atrial fibrillation: Secondary | ICD-10-CM | POA: Diagnosis not present

## 2015-09-13 HISTORY — PX: CARDIOVERSION: SHX1299

## 2015-09-13 SURGERY — CARDIOVERSION
Anesthesia: General

## 2015-09-13 MED ORDER — LIDOCAINE HCL (CARDIAC) 20 MG/ML IV SOLN
INTRAVENOUS | Status: DC | PRN
  Administered 2015-09-13: 60 mg via INTRAVENOUS

## 2015-09-13 MED ORDER — PROPOFOL 10 MG/ML IV BOLUS
INTRAVENOUS | Status: DC | PRN
  Administered 2015-09-13: 60 mg via INTRAVENOUS

## 2015-09-13 MED ORDER — SODIUM CHLORIDE 0.9 % IV SOLN
INTRAVENOUS | Status: DC
  Administered 2015-09-13: 13:00:00 via INTRAVENOUS

## 2015-09-13 NOTE — Transfer of Care (Signed)
Immediate Anesthesia Transfer of Care Note  Patient: [name]  Procedure(s) Performed: Procedure(s): CARDIOVERSION (N/A)  Patient Location: Endoscopy Unit  Anesthesia Type:General  Level of Consciousness: awake, alert  and oriented  Airway & Oxygen Therapy: Patient Spontanous Breathing and Patient connected to nasal cannula oxygen  Post-op Assessment: Report given to RN, Post -op Vital signs reviewed and stable and Patient moving all extremities X 4  Post vital signs: Reviewed and stable  Last Vitals:  Filed Vitals:   09/13/15 1245  BP: 120/95  Pulse: 52  Temp:   Resp: 16    Complications: No apparent anesthesia complications

## 2015-09-13 NOTE — CV Procedure (Signed)
Direct current cardioversion:  Indication symptomatic A. Fibrillation.  Procedure: Using 60 mg of IV Propofol and 60 IV Lidocaine (for reducing venous pain) for achieving deep sedation, synchronized direct current cardioversion performed. Patient was delivered with 120 Joules of electricity X 1 with success to NSR. Patient tolerated the procedure well. No immediate complication noted.

## 2015-09-13 NOTE — Anesthesia Preprocedure Evaluation (Addendum)
Anesthesia Evaluation  Patient identified by MRN, date of birth, ID band Patient awake    Reviewed: Allergy & Precautions, H&P , NPO status , Patient's Chart, lab work & pertinent test results  Airway Mallampati: I  TM Distance: >3 FB Neck ROM: Full    Dental no notable dental hx. (+) Upper Dentures, Dental Advisory Given   Pulmonary shortness of breath, asthma , sleep apnea and Continuous Positive Airway Pressure Ventilation , former smoker,    Pulmonary exam normal breath sounds clear to auscultation       Cardiovascular + Peripheral Vascular Disease and +CHF  + dysrhythmias Atrial Fibrillation  Rhythm:Irregular Rate:Normal     Neuro/Psych  Headaches, Anxiety Depression    GI/Hepatic Neg liver ROS, GERD  Medicated,  Endo/Other  Hypothyroidism Morbid obesity  Renal/GU negative Renal ROS  negative genitourinary   Musculoskeletal  (+) Arthritis , Osteoarthritis,  Fibromyalgia -  Abdominal   Peds  Hematology negative hematology ROS (+)   Anesthesia Other Findings   Reproductive/Obstetrics negative OB ROS                           Anesthesia Physical Anesthesia Plan  ASA: III  Anesthesia Plan: General   Post-op Pain Management:    Induction: Intravenous  Airway Management Planned: Mask  Additional Equipment:   Intra-op Plan:   Post-operative Plan:   Informed Consent: I have reviewed the patients History and Physical, chart, labs and discussed the procedure including the risks, benefits and alternatives for the proposed anesthesia with the patient or authorized representative who has indicated his/her understanding and acceptance.   Dental advisory given  Plan Discussed with: CRNA  Anesthesia Plan Comments:         Anesthesia Quick Evaluation

## 2015-09-13 NOTE — Discharge Instructions (Signed)
Electrical Cardioversion, Care After °Refer to this sheet in the next few weeks. These instructions provide you with information on caring for yourself after your procedure. Your health care provider may also give you more specific instructions. Your treatment has been planned according to current medical practices, but problems sometimes occur. Call your health care provider if you have any problems or questions after your procedure. °WHAT TO EXPECT AFTER THE PROCEDURE °After your procedure, it is typical to have the following sensations: °· Some redness on the skin where the shocks were delivered. If this is tender, a sunburn lotion or hydrocortisone cream may help. °· Possible return of an abnormal heart rhythm within hours or days after the procedure. °HOME CARE INSTRUCTIONS °· Take medicines only as directed by your health care provider. Be sure you understand how and when to take your medicine. °· Learn how to feel your pulse and check it often. °· Limit your activity for 48 hours after the procedure or as directed by your health care provider. °· Avoid or minimize caffeine and other stimulants as directed by your health care provider. °SEEK MEDICAL CARE IF: °· You feel like your heart is beating too fast or your pulse is not regular. °· You have any questions about your medicines. °· You have bleeding that will not stop. °SEEK IMMEDIATE MEDICAL CARE IF: °· You are dizzy or feel faint. °· It is hard to breathe or you feel short of breath. °· There is a change in discomfort in your chest. °· Your speech is slurred or you have trouble moving an arm or leg on one side of your body. °· You get a serious muscle cramp that does not go away. °· Your fingers or toes turn cold or blue. °  °This information is not intended to replace advice given to you by your health care provider. Make sure you discuss any questions you have with your health care provider. °  °Document Released: 09/02/2013 Document Revised: 12/03/2014  Document Reviewed: 09/02/2013 °Elsevier Interactive Patient Education ©2016 [employer] ° °

## 2015-09-13 NOTE — Anesthesia Postprocedure Evaluation (Signed)
  Anesthesia Post-op Note  Patient: [name]  Procedure(s) Performed: Procedure(s): CARDIOVERSION (N/A)  Patient Location: PACU  Anesthesia Type:General  Level of Consciousness: awake and alert   Airway and Oxygen Therapy: Patient Spontanous Breathing  Post-op Pain: Controlled  Post-op Assessment: Post-op Vital signs reviewed, Patient's Cardiovascular Status Stable and Respiratory Function Stable  Post-op Vital Signs: Reviewed  Filed Vitals:   09/13/15 1257  BP: 123/67  Pulse: 52  Temp: 36.3 C  Resp: 18    Complications: No apparent anesthesia complications

## 2015-09-13 NOTE — Interval H&P Note (Signed)
History and Physical Interval Note:  09/13/2015 12:27 PM  [name]  has presented today for surgery, with the diagnosis of afib  The various methods of treatment have been discussed with the patient and family. After consideration of risks, benefits and other options for treatment, the patient has consented to  Procedure(s): CARDIOVERSION (N/A) as a surgical intervention .  The patient's history has been reviewed, patient examined, no change in status, stable for surgery.  I have reviewed the patient's chart and labs.  Questions were answered to the patient's satisfaction.     Adrian Prows

## 2015-09-14 ENCOUNTER — Encounter (HOSPITAL_COMMUNITY): Payer: Self-pay | Admitting: Cardiology

## 2015-09-22 DIAGNOSIS — Z7901 Long term (current) use of anticoagulants: Secondary | ICD-10-CM | POA: Diagnosis not present

## 2015-09-22 DIAGNOSIS — I480 Paroxysmal atrial fibrillation: Secondary | ICD-10-CM | POA: Diagnosis not present

## 2015-09-22 DIAGNOSIS — E662 Morbid (severe) obesity with alveolar hypoventilation: Secondary | ICD-10-CM | POA: Diagnosis not present

## 2015-09-22 DIAGNOSIS — Z9889 Other specified postprocedural states: Secondary | ICD-10-CM | POA: Diagnosis not present

## 2015-12-01 DIAGNOSIS — E039 Hypothyroidism, unspecified: Secondary | ICD-10-CM | POA: Diagnosis not present

## 2015-12-01 DIAGNOSIS — F330 Major depressive disorder, recurrent, mild: Secondary | ICD-10-CM | POA: Diagnosis not present

## 2015-12-01 DIAGNOSIS — R1011 Right upper quadrant pain: Secondary | ICD-10-CM | POA: Diagnosis not present

## 2015-12-01 DIAGNOSIS — Z23 Encounter for immunization: Secondary | ICD-10-CM | POA: Diagnosis not present

## 2015-12-01 DIAGNOSIS — M109 Gout, unspecified: Secondary | ICD-10-CM | POA: Diagnosis not present

## 2015-12-01 DIAGNOSIS — I4891 Unspecified atrial fibrillation: Secondary | ICD-10-CM | POA: Diagnosis not present

## 2015-12-01 DIAGNOSIS — J309 Allergic rhinitis, unspecified: Secondary | ICD-10-CM | POA: Diagnosis not present

## 2015-12-02 ENCOUNTER — Encounter: Payer: Self-pay | Admitting: Internal Medicine

## 2015-12-21 DIAGNOSIS — Z7901 Long term (current) use of anticoagulants: Secondary | ICD-10-CM | POA: Diagnosis not present

## 2015-12-21 DIAGNOSIS — I482 Chronic atrial fibrillation: Secondary | ICD-10-CM | POA: Diagnosis not present

## 2015-12-21 DIAGNOSIS — Z9889 Other specified postprocedural states: Secondary | ICD-10-CM | POA: Diagnosis not present

## 2015-12-21 DIAGNOSIS — E662 Morbid (severe) obesity with alveolar hypoventilation: Secondary | ICD-10-CM | POA: Diagnosis not present

## 2015-12-27 ENCOUNTER — Ambulatory Visit (INDEPENDENT_AMBULATORY_CARE_PROVIDER_SITE_OTHER): Payer: Medicare Other | Admitting: Gastroenterology

## 2015-12-27 ENCOUNTER — Telehealth: Payer: Self-pay

## 2015-12-27 ENCOUNTER — Encounter: Payer: Self-pay | Admitting: Gastroenterology

## 2015-12-27 VITALS — BP 133/75 | HR 80 | Temp 97.8°F | Ht 62.0 in | Wt 311.2 lb

## 2015-12-27 DIAGNOSIS — R1011 Right upper quadrant pain: Secondary | ICD-10-CM | POA: Diagnosis not present

## 2015-12-27 DIAGNOSIS — Z86010 Personal history of colonic polyps: Secondary | ICD-10-CM | POA: Diagnosis not present

## 2015-12-27 NOTE — Progress Notes (Signed)
Primary Care Physician:  Denny Levy, [place] Primary Gastroenterologist:  Dr. Gala Romney   Chief Complaint  Patient presents with  . set up TCS/EGD    HPI:   [name] is a 75 y.o. female presenting today at the request of her PCP to arrange a surveillance colonoscopy and possible EGD. She has a history of simple adenomas in June 2011, with recommendations for surveillance in June 2014 with Propofol. When last seen in 2014, she was reporting RUQ pain. CT was overall unimpressive with known fatty liver. MRCP was recommended but she wanted to hold off on this.   She states she has chronic RUQ pain that wraps around to her back. Constant in nature. States her upper abdomen will "puff up". Eating worsens pain. Can't wear a bra. Feels like a band around her upper abdomen. When she eats, she can feel like it is congesting in her RUQ. No N/V. GERD controlled with Nexium. No NSAIDs or aspirin powders. On Eliquis for afib. No rectal bleeding. Mild episodes of diarrhea every once in awhile. States stool is "light", no color to it. Will drink cherry juice and it gets dark.   Last CT March 2014 with fatty liver. Incidental 27mm low density lesion in medial upper pole of left kidney likely a cyst but unable to characterize.   Her husband is in hospice care now.   Past Medical History  Diagnosis Date  . GERD (gastroesophageal reflux disease)   . Fibromyalgia   . Anxiety   . Depression   . Obesity, morbid (more than 100 lbs over ideal weight or BMI > 40) (HCC)   . Hyperlipemia   . Hypothyroidism   . Obstructive sleep apnea on CPAP and O2  . Migraine   . Colon polyp     TCS 2005 TICS [place], TCS/PROPOFOL 2011 SIMPLE ADENOMAS  . Allergic rhinitis   . Osteoarthritis   . Narcolepsy   . Cholecystitis   . Persistent atrial fibrillation ([place])     a. s/p DCCV 01/2012  . Diverticulosis 2005  . Internal hemorrhoids     H/o Rectal bleeding secondary to internal hemorrhoids 04/2010.  . Volume overload    uses torsemide PRN  . Fatty liver   . Gout   . Asthma     pt states she "believe it is more allergies"    Past Surgical History  Procedure Laterality Date  . Abdominal hysterectomy  FIBROIDS 1982  . Rhinoplasty    . Bladder suspension  1982  . Kidney stent  1995  . Cardioversion  02/05/2012    Procedure: CARDIOVERSION;  Surgeon: Laverda Page, MD;  Location: [place];  Service: Cardiovascular;  Laterality: N/A;  . Cholecystectomy  09/03/2012    Procedure: LAPAROSCOPIC CHOLECYSTECTOMY;  Surgeon: Jamesetta So, MD;  Location: [facility];  Service: General;  Laterality: N/A;  . Colonoscopy  2005    Dr. Tamala Julian: sigmoid diverticulosis  . Colonoscopy  June 2011    Dr. Oneida Alar: internal hemorrhoids, simple adenomas, hyperplastic polyps, needs surveillance in June 2014 with overtube and Propofol  . Sphincterotomy  10/01/2012    Procedure: SPHINCTEROTOMY;  Surgeon: Daneil Dolin, MD;  Location: [facility];  Service: Endoscopy;  Laterality: N/A;  . Removal of stones  10/01/2012    Procedure: REMOVAL OF STONES;  Surgeon: Daneil Dolin, MD;  Location: [facility];  Service: Endoscopy;  Laterality: N/A;  . Ercp  10/01/2012    Procedure: ENDOSCOPIC RETROGRADE CHOLANGIOPANCREATOGRAPHY (ERCP);  Surgeon: Herbie Baltimore  Hilton Cork, MD;  Location: [facility];  Service: Endoscopy;  Laterality: N/A;  . Cardioversion N/A 06/09/2013    Procedure: CARDIOVERSION;  Surgeon: Laverda Page, MD;  Location: [facility] END[facility];  Service: Cardiovascular;  Laterality: N/A;  h&p in file-[facility]  . Cardioversion N/A 08/09/2015    Procedure: CARDIOVERSION;  Surgeon: Adrian Prows, MD;  Location: [place];  Service: Cardiovascular;  Laterality: N/A;  . Cardioversion N/A 09/13/2015    Procedure: CARDIOVERSION;  Surgeon: Adrian Prows, MD;  Location: [place];  Service: Cardiovascular;  Laterality: N/A;    Current Outpatient Prescriptions  Medication Sig Dispense Refill  . albuterol (PROVENTIL,VENTOLIN) 90 MCG/ACT inhaler Inhale 2 puffs into the lungs  every 6 (six) hours as needed for shortness of breath.     Marland Kitchen apixaban (ELIQUIS) 5 MG TABS tablet Take 5 mg by mouth 2 (two) times daily.    . Calcium Carbonate-Vitamin D (CALCIUM 600+D) 600-200 MG-UNIT TABS Take 1 tablet by mouth daily.    . colchicine (COLCRYS) 0.6 MG tablet Take 0.6 mg by mouth daily as needed (flare up).    . esomeprazole (NEXIUM) 40 MG capsule Take 40 mg by mouth daily as needed (reflux).     . flecainide (TAMBOCOR) 100 MG tablet Take 150 mg by mouth 2 (two) times daily.     Marland Kitchen FLUoxetine (PROZAC) 20 MG capsule Take 60 mg by mouth every morning.     Marland Kitchen levothyroxine (SYNTHROID, LEVOTHROID) 50 MCG tablet Take 50 mcg by mouth daily before breakfast.    . loratadine (CLARITIN) 10 MG tablet Take 10 mg by mouth daily as needed for allergies.    Marland Kitchen LORazepam (ATIVAN) 1 MG tablet Take 1.5 mg by mouth 3 (three) times daily.    . Misc Natural Products (TART CHERRY ADVANCED PO) Take 4 oz by mouth daily.    . mometasone (NASONEX) 50 MCG/ACT nasal spray Place 2 sprays into both nostrils daily.    . potassium chloride SA (KLOR-CON M20) 20 MEQ tablet Take 20-40 mEq by mouth daily as needed (taken with torsemide). With Torsemide    . propranolol ER (INDERAL LA) 80 MG 24 hr capsule Take 80 mg by mouth daily.    Marland Kitchen torsemide (DEMADEX) 20 MG tablet Take 20-40 mg by mouth daily as needed (1-2 tab).      No current facility-administered medications for this visit.    Allergies as of 12/27/2015 - Review Complete 12/27/2015  Allergen Reaction Noted  . Demerol [meperidine] Rash 09/30/2012  . Atorvastatin Other (See Comments) 11/30/2006  . Zocor [simvastatin - high dose] Other (See Comments) 01/25/2012    Family History  Problem Relation Age of Onset  . Colon cancer Mother 72  . Diabetes Mother   . Cerebral aneurysm Father   . Asthma Mother   . Asthma Maternal Aunt   . Asthma Brother   . Heart disease Mother   . Heart disease Maternal Grandfather   . Heart disease Maternal Grandmother       Social History   Social History  . Marital Status: Married    Spouse Name: N/A  . Number of Children: N/A  . Years of Education: N/A   Occupational History  . Homemaker    Social History Main Topics  . Smoking status: Former Smoker -- 0.50 packs/day for 30 years    Quit date: 10/03/1989  . Smokeless tobacco: Never Used  . Alcohol Use: No  . Drug Use: No  . Sexual Activity: No   Other Topics Concern  .  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  CV: Denies chest pain, heart palpitations, peripheral edema, syncope.  Resp: +DOE, +SOB, has a "cold" today GI: Denies dysphagia or odynophagia. Denies jaundice, hematemesis, fecal incontinence. GU : Denies urinary burning, urinary frequency, urinary hesitancy MS: Denies joint pain, muscle weakness, cramps, or limitation of movement.  Derm: Denies rash, itching, dry skin Psych: Denies depression, anxiety, memory loss, and confusion Heme: Denies bruising, bleeding, and enlarged lymph nodes.  Physical Exam: BP 133/75 mmHg  Pulse 80  Temp(Src) 97.8 F (36.6 C)  Ht 5\' 2"  (1.575 m)  Wt 311 lb 3.2 oz (141.159 kg)  BMI 56.90 kg/m2 General:   Alert and oriented. Pleasant and cooperative. Well-nourished and well-developed.  Head:  Normocephalic and atraumatic. Eyes:  Without icterus, sclera clear and conjunctiva pink.  Ears:  Normal auditory acuity. Nose:  No deformity, discharge,  or lesions. Mouth:  No deformity or lesions, oral mucosa pink.  Lungs:  Clear to auscultation bilaterally. No wheezes, rales, or rhonchi. No distress.  Heart:  S1, S2 present without murmurs appreciated.  Abdomen:  +BS, soft, largely obese. TTP RUQ. Unable to appreciate HSM due to large AP diameter.   Extremities:  Edema bilateral lower extremities  Neurologic:  Alert and  oriented x4;  grossly normal  neurologically. Psych:  Alert and cooperative. Normal mood and affect.

## 2015-12-27 NOTE — Patient Instructions (Signed)
Please complete the blood work today. After I review that, we can decide the best imaging.   We have scheduled you for a colonoscopy and upper endoscopy with Dr. Gala Romney.   I am asking your cardiologist if we can hold your Eliquis for 48-72 hours.

## 2015-12-27 NOTE — Telephone Encounter (Signed)
I have faxed a note to Dr. Nadyne Coombes to advise on holding Eloquis 48-72 hours prior to TCS and EGD. Phone number : [phone] Fax: [phone]

## 2015-12-28 ENCOUNTER — Other Ambulatory Visit: Payer: Self-pay

## 2015-12-28 ENCOUNTER — Telehealth: Payer: Self-pay

## 2015-12-28 DIAGNOSIS — R1011 Right upper quadrant pain: Secondary | ICD-10-CM

## 2015-12-28 LAB — CBC
HCT: 42.4 % (ref 36.0–46.0)
Hemoglobin: 13.8 g/dL (ref 12.0–15.0)
MCH: 29.4 pg (ref 26.0–34.0)
MCHC: 32.5 g/dL (ref 30.0–36.0)
MCV: 90.4 fL (ref 78.0–100.0)
MPV: 10.6 fL (ref 8.6–12.4)
PLATELETS: 249 10*3/uL (ref 150–400)
RBC: 4.69 MIL/uL (ref 3.87–5.11)
RDW: 13.7 % (ref 11.5–15.5)
WBC: 8 10*3/uL (ref 4.0–10.5)

## 2015-12-28 LAB — COMPLETE METABOLIC PANEL WITH GFR
ALT: 17 U/L (ref 6–29)
AST: 18 U/L (ref 10–35)
Albumin: 3.8 g/dL (ref 3.6–5.1)
Alkaline Phosphatase: 81 U/L (ref 33–130)
BUN: 9 mg/dL (ref 7–25)
CHLORIDE: 102 mmol/L (ref 98–110)
CO2: 30 mmol/L (ref 20–31)
CREATININE: 1.09 mg/dL — AB (ref 0.60–0.93)
Calcium: 8.6 mg/dL (ref 8.6–10.4)
GFR, Est African American: 58 mL/min — ABNORMAL LOW (ref >=60)
GFR, Est Non African American: 50 mL/min — ABNORMAL LOW (ref >=60)
Glucose, Bld: 100 mg/dL — ABNORMAL HIGH (ref 65–99)
Potassium: 4 mmol/L (ref 3.5–5.3)
SODIUM: 142 mmol/L (ref 135–146)
Total Bilirubin: 0.5 mg/dL (ref 0.2–1.2)
Total Protein: 6.2 g/dL (ref 6.1–8.1)

## 2015-12-28 LAB — LIPASE: Lipase: 20 U/L (ref 7–60)

## 2015-12-28 NOTE — Telephone Encounter (Signed)
Pt called to speak with DS. [phone]

## 2015-12-28 NOTE — Telephone Encounter (Signed)
Noted. I am awaiting blood work. No need to urgently do colonoscopy/EGD right this minute. We will see her back in 2 months and arrange this. For now, I will make further recommendations on abdominal imaging after review of labs.

## 2015-12-28 NOTE — Telephone Encounter (Signed)
[name], I called pt to inform her we would need to wait a couple of months. She said she was going to call Dr. Einar Gip and cancel the cardioversion because she wants to proceed with the colonoscopy now. I told her that we would not be able to schedule it unless Dr. Nadyne Coombes says it's OK now and for her to hold the Eliquis.

## 2015-12-28 NOTE — Telephone Encounter (Signed)
I called and informed pt. She had already called and cancelled the cardioversion. She is aware we cannot schedule the procedures without cardiology approval. She said she is not not feeling well now and does not want to do the cardioversion. She will do the CT. Please schedule before 01/06/2016 or after 01/11/2016.

## 2015-12-28 NOTE — Telephone Encounter (Signed)
Oh dear. No, I recommend she follow through with cardiology recommendations. I would not schedule her for a procedure without being cleared by cardiology first, and that won't happen unless she has the cardioversion. She definitely needs to do that. Anesthesia would not feel she is appropriate with what is going on.   I can go ahead and order imaging. Her LFTs are normal. Cr is only mildly elevated at 1.09. She continues to have RUQ pain. Let's proceed with CT abd/pelvis.   Lab Results  Component Value Date   ALT 17 12/27/2015   AST 18 12/27/2015   ALKPHOS 81 12/27/2015   BILITOT 0.5 12/27/2015    Lab Results  Component Value Date   CREATININE 1.09* 12/27/2015   BUN 9 12/27/2015   NA 142 12/27/2015   K 4.0 12/27/2015   CL 102 12/27/2015   CO2 30 12/27/2015

## 2015-12-28 NOTE — Telephone Encounter (Signed)
Spoke with pt and CT is set for 01/03/2016. She is aware to pick up contrast before her procedure

## 2015-12-28 NOTE — Telephone Encounter (Signed)
See separate phone note. I have spoke to pt.

## 2015-12-28 NOTE — Telephone Encounter (Signed)
Jennifer Prows, MD  Everardo All, LPN           She is being admitted electively for A. Fibrillation pharmacologic cardioversion and hence will not be able to come off of anticoagulation for at least 6-8 weeks for colonoscopy by De. Rourk,, but in 2 months she should be able to hold Eliquis for 24 to 48 houors (2-4 doses max) and have colonoscopy. Please schedule in 2 months, unless things change.

## 2015-12-29 NOTE — Telephone Encounter (Signed)
Received a fax from Dr. Einar Gip dated today:  Michela Pitcher pt can hold Eliquis for 4 doses prior to endoscopy/colonoscopy and restart ASAP.  Pt was scheduled to be admitted for Tikosyn but pt changed her mind.  NOTE PLACED ON ANNA'S DESK

## 2015-12-30 NOTE — Telephone Encounter (Signed)
I see where she has postponed the cardiac procedure, and her cardiologist (Dr. Einar Gip) is aware. He has stated she can hold Eliquis for 48 hours and restart ASAP.

## 2016-01-02 NOTE — Telephone Encounter (Signed)
noted 

## 2016-01-03 ENCOUNTER — Ambulatory Visit (HOSPITAL_COMMUNITY): Payer: Medicare Other

## 2016-01-03 NOTE — Telephone Encounter (Signed)
Why did she cancel CT?  Please arrange for TCS/EGD with Dr. Gala Romney WITH PROPOFOL due to history of adenomas and persistent abdominal pain. HOLD ELIQUIS 48 HOURS.

## 2016-01-04 NOTE — Telephone Encounter (Signed)
Routing to [facility] to schedule.  

## 2016-01-04 NOTE — Assessment & Plan Note (Addendum)
Chronic RUQ pain, constant, worsened with eating. On Nexium, which controls GERD symptoms. Known fatty liver. No prior EGD. Gallbladder absent. She also notes "light-colored" stools. Will order LFTs now. Needs CT imaging for further evaluation, and I would also recommend an EGD to assess for any gastritis, PUD. CT in 2014 with indeterminate renal lesion likely cyst. CT can help sort this out further as well.    Proceed with upper endoscopy in the near future with Dr. Gala Romney. The risks, benefits, and alternatives have been discussed in detail with patient. They have stated understanding and desire to proceed.  PROPOFOL due to polypharmacy

## 2016-01-04 NOTE — Telephone Encounter (Signed)
Spoke with pt this morning via phone. She states she cancelled the CT scan because her husband is very sick and is now in Hospice care.  States she has cancelled the cardioversion and is very confused on why now it is ok to hold the Eliquis.    I explained to pt the ok from Dr. Nadyne Coombes in regards to hold Eliquis.  Pt is very talkative on the phone and explaining to me about her medications and reactions and that she feels like she needs to wait on this for now. States she feels very confused and has a lot on her right now.    I informed the patient to let me know what she wants to do to proceed further in her care.  Pt states that she just wants to put everything on hold right now due to her husband being so sick.  Pt wanted to know when she needed to come back into the office.     As noted on this phone note Pt needed to follow up in 2 months.  I made pt an OV on 02/27/2016.    Routing to Vicente Males to make her aware and to make further recommendations

## 2016-01-04 NOTE — Assessment & Plan Note (Signed)
History of adenomas in 2011, due for surveillance now. No concerning lower GI symptoms. Will arrange for colonoscopy in the near future.   Proceed with TCS with Dr. Gala Romney in near future: the risks, benefits, and alternatives have been discussed with the patient in detail. The patient states understanding and desires to proceed. PROPOFOL due to polypharmacy. Discussed with cardiology: ok to hold Eliquis X 48 hours.

## 2016-01-04 NOTE — Progress Notes (Signed)
CC'ED TO PCP 

## 2016-01-04 NOTE — Telephone Encounter (Signed)
Spoke with patient. We are putting procedures and CT on hold due to family situation. This is truly the best thing in her case. I will see her in April.

## 2016-02-27 ENCOUNTER — Other Ambulatory Visit: Payer: Self-pay

## 2016-02-27 ENCOUNTER — Encounter: Payer: Self-pay | Admitting: Gastroenterology

## 2016-02-27 ENCOUNTER — Ambulatory Visit (INDEPENDENT_AMBULATORY_CARE_PROVIDER_SITE_OTHER): Payer: Medicare Other | Admitting: Gastroenterology

## 2016-02-27 VITALS — BP 116/69 | HR 56 | Temp 98.1°F | Ht 62.0 in | Wt 307.6 lb

## 2016-02-27 DIAGNOSIS — R1011 Right upper quadrant pain: Secondary | ICD-10-CM | POA: Diagnosis not present

## 2016-02-27 DIAGNOSIS — Z86010 Personal history of colonic polyps: Secondary | ICD-10-CM

## 2016-02-27 LAB — BASIC METABOLIC PANEL
BUN: 10 mg/dL (ref 7–25)
CALCIUM: 9 mg/dL (ref 8.6–10.4)
CO2: 30 mmol/L (ref 20–31)
CREATININE: 0.68 mg/dL (ref 0.60–0.93)
Chloride: 104 mmol/L (ref 98–110)
Glucose, Bld: 97 mg/dL (ref 65–99)
Potassium: 4.2 mmol/L (ref 3.5–5.3)
SODIUM: 142 mmol/L (ref 135–146)

## 2016-02-27 MED ORDER — PEG 3350-KCL-NA BICARB-NACL 420 G PO SOLR: 4000.0000 mL | Freq: Once | ORAL | Status: DC

## 2016-02-27 NOTE — Patient Instructions (Signed)
We have scheduled you for a CT scan to further evaluate your pain. You will need to have your kidney function checked first, which is why I ordered the blood work.  You have also been scheduled for a colonoscopy and possible upper endoscopy (if needed).  I will be thinking of you, and I'm so sorry about your husband!

## 2016-02-27 NOTE — Assessment & Plan Note (Signed)
History of adenomas in 2011, due for surveillance now. No concerning lower GI symptoms. Mother with history of colon cancer.  PATIENT IS ON ELIQUIS. Cardiology has approved holding this for 48 hours prior.   Proceed with TCS with Dr. Gala Romney in near future: the risks, benefits, and alternatives have been discussed with the patient in detail. The patient states understanding and desires to proceed. PROPOFOL due to polypharmacy HOLD ELIQUIS X 48 hours

## 2016-02-27 NOTE — Assessment & Plan Note (Addendum)
Chronic history of RUQ abdominal pain with last imaging in 2014. LFTs and lipase normal recently. Questionable abdominal wall component. No prior EGD. Gallbladder absent. Pursue CT now and possible EGD at time of colonoscopy if CT unrevealing.   Proceed with POSSIBLE upper endoscopy at time of colonoscopy.  The risks, benefits, and alternatives have been discussed in detail with patient. They have stated understanding and desire to proceed.  PROPOFOL due to polypharmacy Continue Nexium daily. Hold ELIQUIS 48 hours prior

## 2016-02-27 NOTE — Progress Notes (Signed)
Referring Provider: Denny Levy, [place] Primary Care Physician:  Denny Levy, [place]  Primary GI: Dr. Gala Romney   Chief Complaint  Patient presents with  . Follow-up    HPI:   [name] is a 75 y.o. female presenting today with a history of simple adenomas in June 2011, with recommendations for surveillance in June 2014 with Propofol. When last seen in 2014, she was reporting RUQ pain. CT was overall unimpressive with known fatty liver at that time. Recent LFTs normal.    RUQ/upper abdomen pain constant. Sometimes worse with eating. No N/V. States at times her RUQ will "puff up". Denies NSAIDs or aspirin powders. On ELIQUIS for afib. No concerning lower GI symptoms. Last CT March 2014 with fatty liver. Incidental 65mm low density lesion in medial upper pole of left kidney likely a cyst but unable to characterize.    Husband just passed away 2 weeks ago after a prolonged illness.   Past Medical History  Diagnosis Date  . GERD (gastroesophageal reflux disease)   . Fibromyalgia   . Anxiety   . Depression   . Obesity, morbid (more than 100 lbs over ideal weight or BMI > 40) (HCC)   . Hyperlipemia   . Hypothyroidism   . Obstructive sleep apnea on CPAP and O2  . Migraine   . Colon polyp     TCS 2005 TICS [place], TCS/PROPOFOL 2011 SIMPLE ADENOMAS  . Allergic rhinitis   . Osteoarthritis   . Narcolepsy   . Cholecystitis   . Persistent atrial fibrillation ([place])     a. s/p DCCV 01/2012  . Diverticulosis 2005  . Internal hemorrhoids     H/o Rectal bleeding secondary to internal hemorrhoids 04/2010.  . Volume overload     uses torsemide PRN  . Fatty liver   . Gout   . Asthma     pt states she "believe it is more allergies"    Past Surgical History  Procedure Laterality Date  . Abdominal hysterectomy  FIBROIDS 1982  . Rhinoplasty    . Bladder suspension  1982  . Kidney stent  1995  . Cardioversion  02/05/2012    Procedure: CARDIOVERSION;  Surgeon: Laverda Page, MD;   Location: [facility];  Service: Cardiovascular;  Laterality: N/A;  . Cholecystectomy  09/03/2012    Procedure: LAPAROSCOPIC CHOLECYSTECTOMY;  Surgeon: Jamesetta So, MD;  Location[facility];  Service: General;  Laterality: N/A;  . Colonoscopy  2005    Dr. Tamala Julian: sigmoid diverticulosis  . Colonoscopy  June 2011    Dr. Oneida Alar: internal hemorrhoids, simple adenomas, hyperplastic polyps, needs surveillance in June 2014 with overtube and Propofol  . Sphincterotomy  10/01/2012    Procedure: SPHINCTEROTOMY;  Surgeon: Daneil Dolin, MD;  Location: [facility];  Service: Endoscopy;  Laterality: N/A;  . Removal of stones  10/01/2012    Procedure: REMOVAL OF STONES;  Surgeon: Daneil Dolin, MD;  [facility];  Service: Endoscopy;  Laterality: N/A;  . Ercp  10/01/2012    Procedure: ENDOSCOPIC RETROGRADE CHOLANGIOPANCREATOGRAPHY (ERCP);  Surgeon: Daneil Dolin, MD;  [facility];  Service: Endoscopy;  Laterality: N/A;  . Cardioversion N/A 06/09/2013    Procedure: CARDIOVERSION;  Surgeon: Laverda Page, MD;  Location: [facility] ENDOSCOPY;  Service: Cardiovascular;  Laterality: N/A;  h&p in file-[facility]  . Cardioversion N/A 08/09/2015    Procedure: CARDIOVERSION;  Surgeon: Adrian Prows, MD;  Location: [place];  Service: Cardiovascular;  Laterality: N/A;  . Cardioversion N/A  09/13/2015    Procedure: CARDIOVERSION;  Surgeon: Adrian Prows, MD;  Location: [facility] ENDOSCOPY;  Service: Cardiovascular;  Laterality: N/A;    Current Outpatient Prescriptions  Medication Sig Dispense Refill  . albuterol (PROVENTIL,VENTOLIN) 90 MCG/ACT inhaler Inhale 2 puffs into the lungs every 6 (six) hours as needed for shortness of breath.     Marland Kitchen apixaban (ELIQUIS) 5 MG TABS tablet Take 5 mg by mouth 2 (two) times daily.    Marland Kitchen esomeprazole (NEXIUM) 40 MG capsule Take 40 mg by mouth daily as needed (reflux).     . furosemide (LASIX) 40 MG tablet     . levothyroxine (SYNTHROID, LEVOTHROID) 50 MCG tablet Take 50 mcg by mouth daily before breakfast.      . LORazepam (ATIVAN) 1 MG tablet Take 1.5 mg by mouth 3 (three) times daily.    . propranolol ER (INDERAL LA) 80 MG 24 hr capsule Take 80 mg by mouth daily.     No current facility-administered medications for this visit.    Allergies as of 02/27/2016 - Review Complete 02/27/2016  Allergen Reaction Noted  . Demerol [meperidine] Rash 09/30/2012  . Atorvastatin Other (See Comments) 11/30/2006  . Zocor [simvastatin - high dose] Other (See Comments) 01/25/2012    Family History  Problem Relation Age of Onset  . Colon cancer Mother 36  . Diabetes Mother   . Cerebral aneurysm Father   . Asthma Mother   . Asthma Maternal Aunt   . Asthma Brother   . Heart disease Mother   . Heart disease Maternal Grandfather   . Heart disease Maternal Grandmother     Social History   Social History  . Marital Status: Married    Spouse Name: N/A  . Number of Children: N/A  . Years of Education: N/A   Occupational History  . Homemaker    Social History Main Topics  . Smoking status: Former Smoker -- 0.50 packs/day for 30 years    Quit date: 10/03/1989  . Smokeless tobacco: Never Used  . Alcohol Use: No  . Drug Use: No  . Sexual Activity: No   Other Topics Concern  . 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 No distress noted. Tearful at times when talking about husband's passing 2 weeks ago but normal response.  Head:  Normocephalic and atraumatic. Eyes:  Conjuctiva clear without scleral icterus. Mouth:  Oral mucosa pink and moist. Good dentition. No lesions. Heart:  S1, S2 present without murmurs, rubs, or gallops. Regular rate and rhythm. Abdomen:  +BS, soft, largely obese. Mild TTP RUQ and upper  abdomen. Difficult to appreciate HSM due to large AP diameter.  Msk:  Symmetrical without gross deformities. Normal posture. Extremities:  Without edema. Neurologic:  Alert and  oriented x4;  grossly normal neurologically. Psych:  Alert and cooperative. Normal mood and affect.  Lab Results  Component Value Date   WBC 8.0 12/27/2015   HGB 13.8 12/27/2015   HCT 42.4 12/27/2015   MCV 90.4 12/27/2015   PLT 249 12/27/2015   Lab Results  Component Value Date   CREATININE 1.09* 12/27/2015   BUN 9  12/27/2015   NA 142 12/27/2015   K 4.0 12/27/2015   CL 102 12/27/2015   CO2 30 12/27/2015   Lab Results  Component Value Date   ALT 17 12/27/2015   AST 18 12/27/2015   ALKPHOS 81 12/27/2015   BILITOT 0.5 12/27/2015   Lab Results  Component Value Date   LIPASE 20 12/27/2015

## 2016-02-29 ENCOUNTER — Ambulatory Visit (HOSPITAL_COMMUNITY)
Admission: RE | Admit: 2016-02-29 | Discharge: 2016-02-29 | Disposition: A | Discharge status: Home / Self Care | Payer: Medicare Other | Source: Ambulatory Visit | Attending: Gastroenterology | Admitting: Gastroenterology

## 2016-02-29 DIAGNOSIS — M5137 Other intervertebral disc degeneration, lumbosacral region: Secondary | ICD-10-CM | POA: Diagnosis not present

## 2016-02-29 DIAGNOSIS — J8410 Pulmonary fibrosis, unspecified: Secondary | ICD-10-CM | POA: Diagnosis not present

## 2016-02-29 DIAGNOSIS — R1011 Right upper quadrant pain: Secondary | ICD-10-CM | POA: Diagnosis not present

## 2016-02-29 DIAGNOSIS — R1031 Right lower quadrant pain: Secondary | ICD-10-CM | POA: Diagnosis not present

## 2016-02-29 DIAGNOSIS — K5730 Diverticulosis of large intestine without perforation or abscess without bleeding: Secondary | ICD-10-CM | POA: Insufficient documentation

## 2016-02-29 DIAGNOSIS — M5136 Other intervertebral disc degeneration, lumbar region: Secondary | ICD-10-CM | POA: Diagnosis not present

## 2016-02-29 MED ORDER — IOHEXOL 300 MG/ML  SOLN
100.0000 mL | Freq: Once | INTRAMUSCULAR | Status: AC | PRN
  Administered 2016-02-29: 100 mL via INTRAVENOUS

## 2016-03-01 NOTE — Progress Notes (Signed)
cc'ed to pcp °

## 2016-03-02 DIAGNOSIS — F330 Major depressive disorder, recurrent, mild: Secondary | ICD-10-CM | POA: Diagnosis not present

## 2016-03-02 DIAGNOSIS — E039 Hypothyroidism, unspecified: Secondary | ICD-10-CM | POA: Diagnosis not present

## 2016-03-02 DIAGNOSIS — R1011 Right upper quadrant pain: Secondary | ICD-10-CM | POA: Diagnosis not present

## 2016-03-08 NOTE — Progress Notes (Signed)
Quick Note:  CT without acute findings. NO etiology for pain. Proceed with colonoscopy/EGD as planned. ______

## 2016-03-12 ENCOUNTER — Encounter (HOSPITAL_COMMUNITY): Payer: Self-pay

## 2016-03-12 ENCOUNTER — Encounter (HOSPITAL_COMMUNITY)
Admission: RE | Admit: 2016-03-12 | Discharge: 2016-03-12 | Disposition: A | Discharge status: Home / Self Care | Payer: Medicare Other | Source: Ambulatory Visit | Attending: Internal Medicine | Admitting: Internal Medicine

## 2016-03-12 DIAGNOSIS — G4733 Obstructive sleep apnea (adult) (pediatric): Secondary | ICD-10-CM | POA: Insufficient documentation

## 2016-03-12 DIAGNOSIS — Z01812 Encounter for preprocedural laboratory examination: Secondary | ICD-10-CM | POA: Insufficient documentation

## 2016-03-12 DIAGNOSIS — E785 Hyperlipidemia, unspecified: Secondary | ICD-10-CM | POA: Diagnosis not present

## 2016-03-12 DIAGNOSIS — M797 Fibromyalgia: Secondary | ICD-10-CM | POA: Insufficient documentation

## 2016-03-12 DIAGNOSIS — E669 Obesity, unspecified: Secondary | ICD-10-CM | POA: Diagnosis not present

## 2016-03-12 DIAGNOSIS — E039 Hypothyroidism, unspecified: Secondary | ICD-10-CM | POA: Insufficient documentation

## 2016-03-12 DIAGNOSIS — G43909 Migraine, unspecified, not intractable, without status migrainosus: Secondary | ICD-10-CM | POA: Diagnosis not present

## 2016-03-12 DIAGNOSIS — F419 Anxiety disorder, unspecified: Secondary | ICD-10-CM | POA: Diagnosis not present

## 2016-03-12 DIAGNOSIS — K219 Gastro-esophageal reflux disease without esophagitis: Secondary | ICD-10-CM | POA: Insufficient documentation

## 2016-03-12 LAB — CBC
HCT: 41.6 % (ref 36.0–46.0)
Hemoglobin: 13.5 g/dL (ref 12.0–15.0)
MCH: 29.3 pg (ref 26.0–34.0)
MCHC: 32.5 g/dL (ref 30.0–36.0)
MCV: 90.4 fL (ref 78.0–100.0)
Platelets: 238 K/uL (ref 150–400)
RBC: 4.6 MIL/uL (ref 3.87–5.11)
RDW: 12.8 % (ref 11.5–15.5)
WBC: 6.5 K/uL (ref 4.0–10.5)

## 2016-03-12 NOTE — Patient Instructions (Signed)
[name]  03/12/2016     @PREFPERIOPPHARMACY @   Your procedure is scheduled on 03/19/2016.  Report to [facility] at 6:45 A.M.  Call this number if you have problems the morning of surgery:  [phone]   Remember:  Do not eat food or drink liquids after midnight.  Take these medicines the morning of surgery with A SIP OF WATER Albuterol inhaler (also bring with you to hospital), Colchicine, Nexium, Synthroid, Ativan , Inderal   Do not wear jewelry, make-up or nail polish.  Do not wear lotions, powders, or perfumes.  You may wear deodorant.  Do not shave 48 hours prior to surgery.  Men may shave face and neck.  Do not bring valuables to the hospital.  [facility] is not responsible for any belongings or valuables.  Contacts, dentures or bridgework may not be worn into surgery.  Leave your suitcase in the car.  After surgery it may be brought to your room.  For patients admitted to the hospital, discharge time will be determined by your treatment team.  Patients discharged the day of surgery will not be allowed to drive home.    Please read over the following fact sheets that you were given. Anesthesia Post-op Instructions     PATIENT INSTRUCTIONS POST-ANESTHESIA  IMMEDIATELY FOLLOWING SURGERY:  Do not drive or operate machinery for the first twenty four hours after surgery.  Do not make any important decisions for twenty four hours after surgery or while taking narcotic pain medications or sedatives.  If you develop intractable nausea and vomiting or a severe headache please notify your doctor immediately.  FOLLOW-UP:  Please make an appointment with your surgeon as instructed. You do not need to follow up with anesthesia unless specifically instructed to do so.  WOUND CARE INSTRUCTIONS (if applicable):  Keep a dry clean dressing on the anesthesia/puncture wound site if there is drainage.  Once the wound has quit draining you may leave it open to air.  Generally you  should leave the bandage intact for twenty four hours unless there is drainage.  If the epidural site drains for more than 36-48 hours please call the anesthesia department.  QUESTIONS?:  Please feel free to call your physician or the hospital operator if you have any questions, and they will be happy to assist you.      Esophagogastroduodenoscopy Esophagogastroduodenoscopy (EGD) is a procedure that is used to examine the lining of the esophagus, stomach, and first part of the small intestine (duodenum). A long, flexible, lighted tube with a camera attached (endoscope) is inserted down the throat to view these organs. This procedure is done to detect problems or abnormalities, such as inflammation, bleeding, ulcers, or growths, in order to treat them. The procedure lasts 5-20 minutes. It is usually an outpatient procedure, but it may need to be performed in a hospital in emergency cases. LET [facility] CARE PROVIDER KNOW ABOUT:  Any allergies you have.  All medicines you are taking, including vitamins, herbs, eye drops, creams, and over-the-counter medicines.  Previous problems you or members of your family have had with the use of anesthetics.  Any blood disorders you have.  Previous surgeries you have had.  Medical conditions you have. RISKS AND COMPLICATIONS Generally, this is a safe procedure. However, problems can occur and include:  Infection.  Bleeding.  Tearing (perforation) of the esophagus, stomach, or duodenum.  Difficulty breathing or not being able to breathe.  Excessive sweating.  Spasms of the larynx.  Slowed heartbeat.  Low blood pressure. BEFORE THE PROCEDURE  Do not eat or drink anything after midnight on the night before the procedure or as directed by your health care provider.  Do not take your regular medicines before the procedure if your health care provider asks you not to. Ask your health care provider about changing or stopping those  medicines.  If you wear dentures, be prepared to remove them before the procedure.  Arrange for someone to drive you home after the procedure. PROCEDURE  A numbing medicine (local anesthetic) may be sprayed in your throat for comfort and to stop you from gagging or coughing.  You will have an IV tube inserted in a vein in your hand or arm. You will receive medicines and fluids through this tube.  You will be given a medicine to relax you (sedative).  A pain reliever will be given through the IV tube.  A mouth guard may be placed in your mouth to protect your teeth and to keep you from biting on the endoscope.  You will be asked to lie on your left side.  The endoscope will be inserted down your throat and into your esophagus, stomach, and duodenum.  Air will be put through the endoscope to allow your health care provider to clearly view the lining of your esophagus.  The lining of your esophagus, stomach, and duodenum will be examined. During the exam, your health care provider may:  Remove tissue to be examined under a microscope (biopsy) for inflammation, infection, or other medical problems.  Remove growths.  Remove objects (foreign bodies) that are stuck.  Treat any bleeding with medicines or other devices that stop tissues from bleeding (hot cautery, clipping devices).  Widen (dilate) or stretch narrowed areas of your esophagus and stomach.  The endoscope will be withdrawn. AFTER THE PROCEDURE  You will be taken to a recovery area for observation. Your blood pressure, heart rate, breathing rate, and blood oxygen level will be monitored often until the medicines you were given have worn off.  Do not eat or drink anything until the numbing medicine has worn off and your gag reflex has returned. You may choke.  Your health care provider should be able to discuss his or her findings with you. It will take longer to discuss the test results if any biopsies were taken.    This information is not intended to replace advice given to you by your health care provider. Make sure you discuss any questions you have with your health care provider.   Document Released: 03/15/2005 Document Revised: 12/03/2014 Document Reviewed: 10/15/2012 Elsevier Interactive Patient Education 2016 [employer]. Colonoscopy A colonoscopy is an exam to look at the entire large intestine (colon). This exam can help find problems such as tumors, polyps, inflammation, and areas of bleeding. The exam takes about 1 hour.  LET [facility] CARE PROVIDER KNOW ABOUT:   Any allergies you have.  All medicines you are taking, including vitamins, herbs, eye drops, creams, and over-the-counter medicines.  Previous problems you or members of your family have had with the use of anesthetics.  Any blood disorders you have.  Previous surgeries you have had.  Medical conditions you have. RISKS AND COMPLICATIONS  Generally, this is a safe procedure. However, as with any procedure, complications can occur. Possible complications include:  Bleeding.  Tearing or rupture of the colon wall.  Reaction to medicines given during the exam.  Infection (rare). BEFORE THE PROCEDURE   Ask your  health care provider about changing or stopping your regular medicines.  You may be prescribed an oral bowel prep. This involves drinking a large amount of medicated liquid, starting the day before your procedure. The liquid will cause you to have multiple loose stools until your stool is almost clear or light green. This cleans out your colon in preparation for the procedure.  Do not eat or drink anything else once you have started the bowel prep, unless your health care provider tells you it is safe to do so.  Arrange for someone to drive you home after the procedure. PROCEDURE   You will be given medicine to help you relax (sedative).  You will lie on your side with your knees bent.  A long, flexible tube  with a light and camera on the end (colonoscope) will be inserted through the rectum and into the colon. The camera sends video back to a computer screen as it moves through the colon. The colonoscope also releases carbon dioxide gas to inflate the colon. This helps your health care provider see the area better.  During the exam, your health care provider may take a small tissue sample (biopsy) to be examined under a microscope if any abnormalities are found.  The exam is finished when the entire colon has been viewed. AFTER THE PROCEDURE   Do not drive for 24 hours after the exam.  You may have a small amount of blood in your stool.  You may pass moderate amounts of gas and have mild abdominal cramping or bloating. This is caused by the gas used to inflate your colon during the exam.  Ask when your test results will be ready and how you will get your results. Make sure you get your test results.   This information is not intended to replace advice given to you by your health care provider. Make sure you discuss any questions you have with your health care provider.   Document Released: 11/09/2000 Document Revised: 09/02/2013 Document Reviewed: 07/20/2013 Elsevier Interactive Patient Education [employer].

## 2016-03-13 ENCOUNTER — Encounter (HOSPITAL_COMMUNITY)
Admission: RE | Admit: 2016-03-13 | Discharge: 2016-03-13 | Disposition: A | Discharge status: Home / Self Care | Payer: Medicare Other | Source: Ambulatory Visit | Attending: Internal Medicine | Admitting: Internal Medicine

## 2016-03-16 ENCOUNTER — Telehealth: Payer: Self-pay

## 2016-03-16 NOTE — Telephone Encounter (Signed)
Should be fine as long as no fever or worsening symptoms.

## 2016-03-16 NOTE — Telephone Encounter (Signed)
Pt aware.

## 2016-03-16 NOTE — Telephone Encounter (Signed)
Pt called and states she is coughing and has chest congestion. Wants to know if she should still have her procedure. No fever just sneezing etc.

## 2016-03-19 ENCOUNTER — Ambulatory Visit (HOSPITAL_COMMUNITY)
Admission: RE | Admit: 2016-03-19 | Discharge: 2016-03-19 | Disposition: A | Discharge status: Home / Self Care | Payer: Medicare Other | Source: Ambulatory Visit | Attending: Internal Medicine | Admitting: Internal Medicine

## 2016-03-19 ENCOUNTER — Encounter (HOSPITAL_COMMUNITY)
Admission: RE | Disposition: A | Discharge status: Home / Self Care | Payer: Self-pay | Source: Ambulatory Visit | Attending: Internal Medicine

## 2016-03-19 ENCOUNTER — Encounter (HOSPITAL_COMMUNITY): Payer: Self-pay | Admitting: Anesthesiology

## 2016-03-19 ENCOUNTER — Ambulatory Visit (HOSPITAL_COMMUNITY): Payer: Medicare Other | Admitting: Anesthesiology

## 2016-03-19 DIAGNOSIS — R1031 Right lower quadrant pain: Secondary | ICD-10-CM | POA: Diagnosis not present

## 2016-03-19 DIAGNOSIS — R1011 Right upper quadrant pain: Secondary | ICD-10-CM | POA: Diagnosis not present

## 2016-03-19 DIAGNOSIS — Z86010 Personal history of colonic polyps: Secondary | ICD-10-CM | POA: Diagnosis not present

## 2016-03-19 DIAGNOSIS — Z800 Family history of malignant neoplasm of digestive organs: Secondary | ICD-10-CM | POA: Diagnosis not present

## 2016-03-19 DIAGNOSIS — Z7901 Long term (current) use of anticoagulants: Secondary | ICD-10-CM | POA: Insufficient documentation

## 2016-03-19 DIAGNOSIS — F418 Other specified anxiety disorders: Secondary | ICD-10-CM | POA: Insufficient documentation

## 2016-03-19 DIAGNOSIS — K621 Rectal polyp: Secondary | ICD-10-CM | POA: Diagnosis not present

## 2016-03-19 DIAGNOSIS — K449 Diaphragmatic hernia without obstruction or gangrene: Secondary | ICD-10-CM | POA: Diagnosis not present

## 2016-03-19 DIAGNOSIS — Z79899 Other long term (current) drug therapy: Secondary | ICD-10-CM | POA: Diagnosis not present

## 2016-03-19 DIAGNOSIS — D124 Benign neoplasm of descending colon: Secondary | ICD-10-CM | POA: Insufficient documentation

## 2016-03-19 DIAGNOSIS — K219 Gastro-esophageal reflux disease without esophagitis: Secondary | ICD-10-CM | POA: Diagnosis not present

## 2016-03-19 DIAGNOSIS — I509 Heart failure, unspecified: Secondary | ICD-10-CM | POA: Diagnosis not present

## 2016-03-19 DIAGNOSIS — D122 Benign neoplasm of ascending colon: Secondary | ICD-10-CM | POA: Insufficient documentation

## 2016-03-19 DIAGNOSIS — I4891 Unspecified atrial fibrillation: Secondary | ICD-10-CM | POA: Diagnosis not present

## 2016-03-19 DIAGNOSIS — E039 Hypothyroidism, unspecified: Secondary | ICD-10-CM | POA: Insufficient documentation

## 2016-03-19 DIAGNOSIS — G4730 Sleep apnea, unspecified: Secondary | ICD-10-CM | POA: Diagnosis not present

## 2016-03-19 DIAGNOSIS — F17200 Nicotine dependence, unspecified, uncomplicated: Secondary | ICD-10-CM | POA: Insufficient documentation

## 2016-03-19 DIAGNOSIS — M797 Fibromyalgia: Secondary | ICD-10-CM | POA: Diagnosis not present

## 2016-03-19 DIAGNOSIS — Z6841 Body Mass Index (BMI) 40.0 and over, adult: Secondary | ICD-10-CM | POA: Diagnosis not present

## 2016-03-19 DIAGNOSIS — K5730 Diverticulosis of large intestine without perforation or abscess without bleeding: Secondary | ICD-10-CM | POA: Diagnosis not present

## 2016-03-19 DIAGNOSIS — Z1211 Encounter for screening for malignant neoplasm of colon: Secondary | ICD-10-CM | POA: Diagnosis not present

## 2016-03-19 HISTORY — PX: ESOPHAGOGASTRODUODENOSCOPY (EGD) WITH PROPOFOL: SHX5813

## 2016-03-19 HISTORY — PX: POLYPECTOMY: SHX5525

## 2016-03-19 HISTORY — PX: COLONOSCOPY WITH PROPOFOL: SHX5780

## 2016-03-19 SURGERY — COLONOSCOPY WITH PROPOFOL
Anesthesia: Monitor Anesthesia Care

## 2016-03-19 MED ORDER — MIDAZOLAM HCL 2 MG/2ML IJ SOLN
INTRAMUSCULAR | Status: AC
  Filled 2016-03-19: qty 2

## 2016-03-19 MED ORDER — GLYCOPYRROLATE 0.2 MG/ML IJ SOLN
0.2000 mg | Freq: Once | INTRAMUSCULAR | Status: AC
  Administered 2016-03-19: 0.2 mg via INTRAVENOUS

## 2016-03-19 MED ORDER — LIDOCAINE VISCOUS 2 % MT SOLN
OROMUCOSAL | Status: AC
  Filled 2016-03-19: qty 15

## 2016-03-19 MED ORDER — LACTATED RINGERS IV SOLN
INTRAVENOUS | Status: DC
  Administered 2016-03-19: 08:00:00 via INTRAVENOUS

## 2016-03-19 MED ORDER — FENTANYL CITRATE (PF) 100 MCG/2ML IJ SOLN
25.0000 ug | INTRAMUSCULAR | Status: AC
  Administered 2016-03-19 (×2): 25 ug via INTRAVENOUS

## 2016-03-19 MED ORDER — PROPOFOL 10 MG/ML IV BOLUS
INTRAVENOUS | Status: AC
  Filled 2016-03-19: qty 40

## 2016-03-19 MED ORDER — PROPOFOL 500 MG/50ML IV EMUL
INTRAVENOUS | Status: DC | PRN
  Administered 2016-03-19: 09:00:00 via INTRAVENOUS
  Administered 2016-03-19: 100 ug/kg/min via INTRAVENOUS
  Administered 2016-03-19: 75 ug/kg/min via INTRAVENOUS

## 2016-03-19 MED ORDER — LIDOCAINE VISCOUS 2 % MT SOLN
15.0000 mL | Freq: Once | OROMUCOSAL | Status: AC
  Administered 2016-03-19: 15 mL via OROMUCOSAL

## 2016-03-19 MED ORDER — ONDANSETRON HCL 4 MG/2ML IJ SOLN: 4.0000 mg | Freq: Once | INTRAMUSCULAR | Status: DC | PRN

## 2016-03-19 MED ORDER — FENTANYL CITRATE (PF) 100 MCG/2ML IJ SOLN
INTRAMUSCULAR | Status: AC
  Filled 2016-03-19: qty 2

## 2016-03-19 MED ORDER — MIDAZOLAM HCL 2 MG/2ML IJ SOLN
1.0000 mg | INTRAMUSCULAR | Status: DC | PRN
  Administered 2016-03-19: 2 mg via INTRAVENOUS

## 2016-03-19 MED ORDER — GLYCOPYRROLATE 0.2 MG/ML IJ SOLN
INTRAMUSCULAR | Status: AC
  Filled 2016-03-19: qty 1

## 2016-03-19 MED ORDER — ONDANSETRON HCL 4 MG/2ML IJ SOLN
INTRAMUSCULAR | Status: AC
  Filled 2016-03-19: qty 2

## 2016-03-19 MED ORDER — ONDANSETRON HCL 4 MG/2ML IJ SOLN
4.0000 mg | Freq: Once | INTRAMUSCULAR | Status: AC
  Administered 2016-03-19: 4 mg via INTRAVENOUS

## 2016-03-19 MED ORDER — FENTANYL CITRATE (PF) 100 MCG/2ML IJ SOLN: 25.0000 ug | INTRAMUSCULAR | Status: DC | PRN

## 2016-03-19 MED ORDER — LIDOCAINE HCL (PF) 1 % IJ SOLN
INTRAMUSCULAR | Status: AC
  Filled 2016-03-19: qty 5

## 2016-03-19 NOTE — H&P (View-Only) (Signed)
Quick Note:  CT without acute findings. NO etiology for pain. Proceed with colonoscopy/EGD as planned. ______

## 2016-03-19 NOTE — Transfer of Care (Signed)
Immediate Anesthesia Transfer of Care Note  Patient: [name]  Procedure(s) Performed: Procedure(s) with comments: COLONOSCOPY WITH PROPOFOL (N/A) - 0815 ESOPHAGOGASTRODUODENOSCOPY (EGD) WITH PROPOFOL (N/A)  Patient Location: PACU  Anesthesia Type:MAC  Level of Consciousness: awake, alert  and oriented  Airway & Oxygen Therapy: Patient Spontanous Breathing and Patient connected to nasal cannula oxygen  Post-op Assessment: Report given to RN  Post vital signs: Reviewed  Last Vitals:  Filed Vitals:   03/19/16 0755 03/19/16 0800  BP: 102/54 98/59  Pulse:    Temp:    Resp: 15 24    Complications: No apparent anesthesia complications

## 2016-03-19 NOTE — Anesthesia Postprocedure Evaluation (Signed)
Anesthesia Post Note  Patient: [name]  Procedure(s) Performed: Procedure(s) (LRB): COLONOSCOPY WITH PROPOFOL (N/A) ESOPHAGOGASTRODUODENOSCOPY (EGD) WITH PROPOFOL (N/A) POLYPECTOMY  Patient location during evaluation: PACU Anesthesia Type: MAC Level of consciousness: awake and alert and oriented Pain management: pain level controlled Vital Signs Assessment: post-procedure vital signs reviewed and stable Respiratory status: spontaneous breathing Cardiovascular status: blood pressure returned to baseline Postop Assessment: no signs of nausea or vomiting Anesthetic complications: no    Last Vitals:  Filed Vitals:   03/19/16 0900 03/19/16 0915  BP: 96/85 94/77  Pulse: 97 75  Temp: 36.6 C   Resp: 13 18    Last Pain: There were no vitals filed for this visit.               Charvi Gammage

## 2016-03-19 NOTE — Anesthesia Preprocedure Evaluation (Signed)
Anesthesia Evaluation  Patient identified by MRN, date of birth, ID band Patient awake    Reviewed: Allergy & Precautions, H&P , NPO status , Patient's Chart, lab work & pertinent test results, reviewed documented beta blocker date and time   History of Anesthesia Complications Negative for: history of anesthetic complications  Airway Mallampati: I       Dental  (+) Edentulous Upper   Pulmonary shortness of breath, asthma , sleep apnea and Continuous Positive Airway Pressure Ventilation , former smoker,     + decreased breath sounds      Cardiovascular + Peripheral Vascular Disease and +CHF  DOE: DCCV on 01/2012.  + dysrhythmias Atrial Fibrillation  Rhythm:Irregular Rate:Normal     Neuro/Psych  Headaches, PSYCHIATRIC DISORDERS Anxiety Depression Hx narcolepsy  Neuromuscular disease    GI/Hepatic GERD  Medicated,  Endo/Other  Hypothyroidism Morbid obesity  Renal/GU      Musculoskeletal  (+) Fibromyalgia -  Abdominal (+) + obese,   Peds  Hematology   Anesthesia Other Findings   Reproductive/Obstetrics                             Anesthesia Physical Anesthesia Plan  ASA: III  Anesthesia Plan: MAC   Post-op Pain Management:    Induction: Intravenous  Airway Management Planned: Simple Face Mask  Additional Equipment:   Intra-op Plan:   Post-operative Plan:   Informed Consent: I have reviewed the patients History and Physical, chart, labs and discussed the procedure including the risks, benefits and alternatives for the proposed anesthesia with the patient or authorized representative who has indicated his/her understanding and acceptance.     Plan Discussed with: CRNA, Anesthesiologist and Surgeon  Anesthesia Plan Comments: (Plan routine monitors, MAC for cardioversion)        Anesthesia Quick Evaluation

## 2016-03-19 NOTE — Interval H&P Note (Signed)
History and Physical Interval Note:  03/19/2016 7:53 AM  [name]  has presented today for surgery, with the diagnosis of history of colon polyps, Right upper pain  The various methods of treatment have been discussed with the patient and family. After consideration of risks, benefits and other options for treatment, the patient has consented to  Procedure(s) with comments: COLONOSCOPY WITH PROPOFOL (N/A) - 0815 ESOPHAGOGASTRODUODENOSCOPY (EGD) WITH PROPOFOL (N/A) as a surgical intervention .  The patient's history has been reviewed, patient examined, no change in status, stable for surgery.  I have reviewed the patient's chart and labs.  Questions were answered to the patient's satisfaction.     [name]  No change. No explanation for right-sided abdominal pain via CT. Therefore, diagnostic EGD and surveillance colonoscopy per plan.  The risks, benefits, limitations, imponderables and alternatives regarding both EGD and colonoscopy have been reviewed with the patient. Questions have been answered. All parties agreeable.

## 2016-03-19 NOTE — Op Note (Signed)
[facility] Patient Name: [name] Procedure Date: 03/19/2016 8:23 AM MRN: [id number] Date of Birth: 11-Sep-1941 Attending MD: Norvel Richards , MD CSN: [id number] Age: 75 Admit Type: Outpatient Procedure:                Colonoscopy Indications:              High risk colon cancer surveillance: Personal                            history of non-advanced adenoma Providers:                Norvel Richards, MD, Gwenlyn Fudge, RN, Georgeann Oppenheim, Technician Referring MD:              Medicines:                Monitored Anesthesia Care Complications:            No immediate complications. Estimated Blood Loss:     Estimated blood loss was minimal. Procedure:                Pre-Anesthesia Assessment:                           - Prior to the procedure, a History and Physical                            was performed, and patient medications and                            allergies were reviewed. The patient's tolerance of                            previous anesthesia was also reviewed. The risks                            and benefits of the procedure and the sedation                            options and risks were discussed with the patient.                            All questions were answered, and informed consent                            was obtained. Prior Anticoagulants: The patient                            last took Eliquis (apixaban) 3 days prior to the                            procedure. ASA Grade Assessment: III - A patient  with severe systemic disease. After reviewing the                            risks and benefits, the patient was deemed in                            satisfactory condition to undergo the procedure.                           - Prior to the procedure, a History and Physical                            was performed, and patient medications and                            allergies were  reviewed. The patient's tolerance of                            previous anesthesia was also reviewed. The risks                            and benefits of the procedure and the sedation                            options and risks were discussed with the patient.                            All questions were answered, and informed consent                            was obtained. Prior Anticoagulants: The patient                            last took Eliquis (apixaban) 2 days prior to the                            procedure. ASA Grade Assessment: III - A patient                            with severe systemic disease. After reviewing the                            risks and benefits, the patient was deemed in                            satisfactory condition to undergo the procedure.                           After obtaining informed consent, the colonoscope                            was passed under direct vision. Throughout the  procedure, the patient's blood pressure, pulse, and                            oxygen saturations were monitored continuously. The                            [id number]) scope was introduced through                            the anus and advanced to the the cecum, identified                            by appendiceal orifice and ileocecal valve. The                            colonoscopy was performed without difficulty. The                            patient tolerated the procedure well. The quality                            of the bowel preparation was adequate. The                            ileocecal valve, appendiceal orifice, and rectum                            were photographed. The entire colon was examined. Scope In: 8:27:09 AM Scope Out: 8:47:36 AM Scope Withdrawal Time: 0 hours 9 minutes 16 seconds  Total Procedure Duration: 0 hours 20 minutes 27 seconds  Findings:      Three sessile polyps were found in the  rectum, descending colon and       ascending colon(1 each in per segment noted). The polyps were 5 to 6 mm       in size. These polyps were removed with a cold snare. Resection and       retrieval were complete. Estimated blood loss: none.      Multiple medium-mouthed diverticula were found in the entire colon.      The exam was otherwise without abnormality on direct and retroflexion       views. Impression:               - Three 5 to 6 mm polyps in the rectum, in the                            descending colon and in the ascending colon,                            removed with a cold snare. Resected and retrieved.                           - Diverticulosis in the entire examined colon.                           -  The examination was otherwise normal on direct                            and retroflexion views. Moderate Sedation:      Moderate (conscious) sedation was personally administered by an       anesthesia professional. The following parameters were monitored: oxygen       saturation, heart rate, blood pressure, respiratory rate, EKG, adequacy       of pulmonary ventilation, and response to care. Total physician       intraservice time was 37 minutes. Recommendation:           - Patient has a contact number available for                            emergencies. The signs and symptoms of potential                            delayed complications were discussed with the                            patient. Return to normal activities tomorrow.                            Written discharge instructions were provided to the                            patient.                           - Advance diet as tolerated.                           - Continue present medications.                           - Await pathology results.                           - Repeat colonoscopy date to be determined after                            pending pathology results are reviewed for                             surveillance based on pathology results.                           - Return to GI office in 6 weeks. See EGD report.                            Resume anticoagulation via Eliquis today Procedure Code(s):        --- Professional ---                           [phone], Colonoscopy, flexible; with removal of  tumor(s), polyp(s), or other lesion(s) by snare                            technique Diagnosis Code(s):        --- Professional ---                           Z86.010, Personal history of colonic polyps                           K62.1, Rectal polyp                           D12.4, Benign neoplasm of descending colon                           D12.2, Benign neoplasm of ascending colon                           K57.30, Diverticulosis of large intestine without                            perforation or abscess without bleeding CPT copyright 2016 American Medical Association. All rights reserved. The codes documented in this report are preliminary and upon coder review may  be revised to meet current compliance requirements. Cristopher Estimable. Dekari Bures, MD Norvel Richards, MD 03/19/2016 9:08:27 AM This report has been signed electronically. Number of Addenda: 0

## 2016-03-19 NOTE — Op Note (Signed)
[facility] Patient Name: [name] Procedure Date: 03/19/2016 7:43 AM MRN: [id number] Date of Birth: [birth year] Attending MD: Norvel Richards , MD CSN: [id number] Age: 75 Admit Type: Outpatient Procedure:                Upper GI endoscopy Indications:              Abdominal pain in the right upper quadrant,                            Abdominal pain in the right lower quadrant Providers:                Norvel Richards, MD, Gwenlyn Fudge, RN, Georgeann Oppenheim, Technician Referring MD:              Medicines:                Monitored Anesthesia Care Complications:            No immediate complications. Estimated Blood Loss:     Estimated blood loss: none. Procedure:                Pre-Anesthesia Assessment:                           - Prior to the procedure, a History and Physical                            was performed, and patient medications and                            allergies were reviewed. The patient's tolerance of                            previous anesthesia was also reviewed. The risks                            and benefits of the procedure and the sedation                            options and risks were discussed with the patient.                            All questions were answered, and informed consent                            was obtained. Prior Anticoagulants: The patient                            last took Eliquis (apixaban) 3 days prior to the                            procedure. ASA Grade Assessment: III - A patient  with severe systemic disease. After reviewing the                            risks and benefits, the patient was deemed in                            satisfactory condition to undergo the procedure.                           After obtaining informed consent, the endoscope was                            passed under direct vision. Throughout the   procedure, the patient's blood pressure, pulse, and                            oxygen saturations were monitored continuously. The                            [id number]) scope was introduced through the                            mouth, and advanced to the second part of duodenum.                            The upper GI endoscopy was accomplished without                            difficulty. The patient tolerated the procedure                            well. Scope In: 8:12:33 AM Scope Out: 8:19:58 AM Total Procedure Duration: 0 hours 7 minutes 25 seconds  Findings:      The examined esophagus was normal.      A small hiatal hernia was present. Otherwise, normal-appearing gastric       mucosa. Some minimally retained gastric contents      The second portion of the duodenum was normal. Impression:               - Normal esophagus.                           - Small hiatal hernia. minimal retained gastric                            contents.                           - Normal second portion of the duodenum.                           - No specimens collected. No explanation for                            symptoms based on today's exam. Moderate Sedation:  Moderate (conscious) sedation was personally administered by an       anesthesia professional. The following parameters were monitored: oxygen       saturation, heart rate, blood pressure, respiratory rate, EKG, adequacy       of pulmonary ventilation, and response to care. Total physician       intraservice time was 37 minutes. Recommendation:           - Patient has a contact number available for                            emergencies. The signs and symptoms of potential                            delayed complications were discussed with the                            patient. Return to normal activities tomorrow.                            Written discharge instructions were provided to the                             patient.                           - Continue present medications. Resume Eliquis today                           - Await pathology results.                           - No repeat upper endoscopy.                           - Return to GI office in 6 weeks.                           - Continue present medications.                           - Advance diet as tolerated. Procedure Code(s):        --- Professional ---                           [phone], Esophagogastroduodenoscopy, flexible,                            transoral; diagnostic, including collection of                            specimen(s) by brushing or washing, when performed                            (separate procedure) Diagnosis Code(s):        --- Professional ---  K44.9, Diaphragmatic hernia without obstruction or                            gangrene                           R10.11, Right upper quadrant pain                           R10.31, Right lower quadrant pain CPT copyright 2016 American Medical [employer]. All rights reserved. The codes documented in this report are preliminary and upon coder review may  be revised to meet current compliance requirements. Cristopher Estimable. [name], MD Norvel Richards, MD 03/19/2016 8:59:05 AM This report has been signed electronically. Number of Addenda: 0

## 2016-03-19 NOTE — Discharge Instructions (Signed)
EGD Discharge instructions Please read the instructions outlined below and refer to this sheet in the next few weeks. These discharge instructions provide you with general information on caring for yourself after you leave the hospital. Your doctor may also give you specific instructions. While your treatment has been planned according to the most current medical practices available, unavoidable complications occasionally occur. If you have any problems or questions after discharge, please call your doctor. ACTIVITY  You may resume your regular activity but move at a slower pace for the next 24 hours.   Take frequent rest periods for the next 24 hours.   Walking will help expel (get rid of) the air and reduce the bloated feeling in your abdomen.   No driving for 24 hours (because of the anesthesia (medicine) used during the test).   You may shower.   Do not sign any important legal documents or operate any machinery for 24 hours (because of the anesthesia used during the test).  NUTRITION  Drink plenty of fluids.   You may resume your normal diet.   Begin with a light meal and progress to your normal diet.   Avoid alcoholic beverages for 24 hours or as instructed by your caregiver.  MEDICATIONS  You may resume your normal medications unless your caregiver tells you otherwise.  WHAT YOU CAN EXPECT TODAY  You may experience abdominal discomfort such as a feeling of fullness or gas pains.  FOLLOW-UP  Your doctor will discuss the results of your test with you.  SEEK IMMEDIATE MEDICAL ATTENTION IF ANY OF THE FOLLOWING OCCUR:  Excessive nausea (feeling sick to your stomach) and/or vomiting.   Severe abdominal pain and distention (swelling).   Trouble swallowing.   Temperature over 101 F (37.8 C).   Rectal bleeding or vomiting of blood.     Colonoscopy Discharge Instructions  Read the instructions outlined below and refer to this sheet in the next few weeks. These  discharge instructions provide you with general information on caring for yourself after you leave the hospital. Your doctor may also give you specific instructions. While your treatment has been planned according to the most current medical practices available, unavoidable complications occasionally occur. If you have any problems or questions after discharge, call Dr. Gala Romney at [phone]. ACTIVITY  You may resume your regular activity, but move at a slower pace for the next 24 hours.   Take frequent rest periods for the next 24 hours.   Walking will help get rid of the air and reduce the bloated feeling in your belly (abdomen).   No driving for 24 hours (because of the medicine (anesthesia) used during the test).    Do not sign any important legal documents or operate any machinery for 24 hours (because of the anesthesia used during the test).  NUTRITION  Drink plenty of fluids.   You may resume your normal diet as instructed by your doctor.   Begin with a light meal and progress to your normal diet. Heavy or fried foods are harder to digest and may make you feel sick to your stomach (nauseated).   Avoid alcoholic beverages for 24 hours or as instructed.  MEDICATIONS  You may resume your normal medications unless your doctor tells you otherwise.  WHAT YOU CAN EXPECT TODAY  Some feelings of bloating in the abdomen.   Passage of more gas than usual.   Spotting of blood in your stool or on the toilet paper.  IF YOU HAD POLYPS REMOVED DURING  THE COLONOSCOPY:  No aspirin products for 7 days or as instructed.   No alcohol for 7 days or as instructed.   Eat a soft diet for the next 24 hours.  FINDING OUT THE RESULTS OF YOUR TEST Not all test results are available during your visit. If your test results are not back during the visit, make an appointment with your caregiver to find out the results. Do not assume everything is normal if you have not heard from your caregiver or the  medical facility. It is important for you to follow up on all of your test results.  SEEK IMMEDIATE MEDICAL ATTENTION IF:  You have more than a spotting of blood in your stool.   Your belly is swollen (abdominal distention).   You are nauseated or vomiting.   You have a temperature over 101.   You have abdominal pain or discomfort that is severe or gets worse throughout the day.    Diverticulosis and polyp information provided  No explanation for right sided abdominal pain found today.  Office visit with [place] in 6 weeks  Further recommendations to follow pending review of pathology report  Resume Eliquis today   Colon Polyps Polyps are lumps of extra tissue growing inside the body. Polyps can grow in the large intestine (colon). Most colon polyps are noncancerous (benign). However, some colon polyps can become cancerous over time. Polyps that are larger than a pea may be harmful. To be safe, caregivers remove and test all polyps. CAUSES  Polyps form when mutations in the genes cause your cells to grow and divide even though no more tissue is needed. RISK FACTORS There are a number of risk factors that can increase your chances of getting colon polyps. They include:  Being older than 50 years.  Family history of colon polyps or colon cancer.  Long-term colon diseases, such as colitis or Crohn disease.  Being overweight.  Smoking.  Being inactive.  Drinking too much alcohol. SYMPTOMS  Most small polyps do not cause symptoms. If symptoms are present, they may include:  Blood in the stool. The stool may look dark red or black.  Constipation or diarrhea that lasts longer than 1 week. DIAGNOSIS People often do not know they have polyps until their caregiver finds them during a regular checkup. Your caregiver can use 4 tests to check for polyps:  Digital rectal exam. The caregiver wears gloves and feels inside the rectum. This test would find polyps only in the  rectum.  Barium enema. The caregiver puts a liquid called barium into your rectum before taking X-rays of your colon. Barium makes your colon look white. Polyps are dark, so they are easy to see in the X-ray pictures.  Sigmoidoscopy. A thin, flexible tube (sigmoidoscope) is placed into your rectum. The sigmoidoscope has a light and tiny camera in it. The caregiver uses the sigmoidoscope to look at the last third of your colon.  Colonoscopy. This test is like sigmoidoscopy, but the caregiver looks at the entire colon. This is the most common method for finding and removing polyps. TREATMENT  Any polyps will be removed during a sigmoidoscopy or colonoscopy. The polyps are then tested for cancer. PREVENTION  To help lower your risk of getting more colon polyps:  Eat plenty of fruits and vegetables. Avoid eating fatty foods.  Do not smoke.  Avoid drinking alcohol.  Exercise every day.  Lose weight if recommended by your caregiver.  Eat plenty of calcium and folate. Foods that  are rich in calcium include milk, cheese, and broccoli. Foods that are rich in folate include chickpeas, kidney beans, and spinach. HOME CARE INSTRUCTIONS Keep all follow-up appointments as directed by your caregiver. You may need periodic exams to check for polyps. SEEK MEDICAL CARE IF: You notice bleeding during a bowel movement.   This information is not intended to replace advice given to you by your health care provider. Make sure you discuss any questions you have with your health care provider.   Document Released: 08/08/2004 Document Revised: 12/03/2014 Document Reviewed: 01/22/2012 Elsevier Interactive Patient Education 2016 [employer]. Diverticulosis Diverticulosis is the condition that develops when small pouches (diverticula) form in the wall of your colon. Your colon, or large intestine, is where water is absorbed and stool is formed. The pouches form when the inside layer of your colon pushes  through weak spots in the outer layers of your colon. CAUSES  No one knows exactly what causes diverticulosis. RISK FACTORS  Being older than 60. Your risk for this condition increases with age. Diverticulosis is rare in people younger than 40 years. By age 80, almost everyone has it.  Eating a low-fiber diet.  Being frequently constipated.  Being overweight.  Not getting enough exercise.  Smoking.  Taking over-the-counter pain medicines, like aspirin and ibuprofen. SYMPTOMS  Most people with diverticulosis do not have symptoms. DIAGNOSIS  Because diverticulosis often has no symptoms, health care providers often discover the condition during an exam for other colon problems. In many cases, a health care provider will diagnose diverticulosis while using a flexible scope to examine the colon (colonoscopy). TREATMENT  If you have never developed an infection related to diverticulosis, you may not need treatment. If you have had an infection before, treatment may include:  Eating more fruits, vegetables, and grains.  Taking a fiber supplement.  Taking a live bacteria supplement (probiotic).  Taking medicine to relax your colon. HOME CARE INSTRUCTIONS   Drink at least 6-8 glasses of water each day to prevent constipation.  Try not to strain when you have a bowel movement.  Keep all follow-up appointments. If you have had an infection before:  Increase the fiber in your diet as directed by your health care provider or dietitian.  Take a dietary fiber supplement if your health care provider approves.  Only take medicines as directed by your health care provider. SEEK MEDICAL CARE IF:   You have abdominal pain.  You have bloating.  You have cramps.  You have not gone to the bathroom in 3 days. SEEK IMMEDIATE MEDICAL CARE IF:   Your pain gets worse.  Yourbloating becomes very bad.  You have a fever or chills, and your symptoms suddenly get worse.  You begin  vomiting.  You have bowel movements that are bloody or black. MAKE SURE YOU:  Understand these instructions.  Will watch your condition.  Will get help right away if you are not doing well or get worse.   This information is not intended to replace advice given to you by your health care provider. Make sure you discuss any questions you have with your health care provider.   Document Released: 08/09/2004 Document Revised: 11/17/2013 Document Reviewed: 10/07/2013 Elsevier Interactive Patient Education [employer].

## 2016-03-21 ENCOUNTER — Encounter: Payer: Self-pay | Admitting: Internal Medicine

## 2016-03-22 ENCOUNTER — Encounter (HOSPITAL_COMMUNITY): Payer: Self-pay | Admitting: Internal Medicine

## 2016-04-10 ENCOUNTER — Emergency Department (HOSPITAL_COMMUNITY)
Admission: EM | Admit: 2016-04-10 | Discharge: 2016-04-11 | Disposition: A | Discharge status: Home / Self Care | Payer: Medicare Other | Attending: Emergency Medicine | Admitting: Emergency Medicine

## 2016-04-10 ENCOUNTER — Encounter (HOSPITAL_COMMUNITY): Payer: Self-pay | Admitting: *Deleted

## 2016-04-10 DIAGNOSIS — J45909 Unspecified asthma, uncomplicated: Secondary | ICD-10-CM | POA: Diagnosis not present

## 2016-04-10 DIAGNOSIS — E785 Hyperlipidemia, unspecified: Secondary | ICD-10-CM | POA: Insufficient documentation

## 2016-04-10 DIAGNOSIS — F329 Major depressive disorder, single episode, unspecified: Secondary | ICD-10-CM | POA: Insufficient documentation

## 2016-04-10 DIAGNOSIS — L03116 Cellulitis of left lower limb: Secondary | ICD-10-CM | POA: Diagnosis not present

## 2016-04-10 DIAGNOSIS — Z6841 Body Mass Index (BMI) 40.0 and over, adult: Secondary | ICD-10-CM | POA: Insufficient documentation

## 2016-04-10 DIAGNOSIS — E039 Hypothyroidism, unspecified: Secondary | ICD-10-CM | POA: Diagnosis not present

## 2016-04-10 DIAGNOSIS — Z87891 Personal history of nicotine dependence: Secondary | ICD-10-CM | POA: Diagnosis not present

## 2016-04-10 DIAGNOSIS — I481 Persistent atrial fibrillation: Secondary | ICD-10-CM | POA: Insufficient documentation

## 2016-04-10 DIAGNOSIS — M1990 Unspecified osteoarthritis, unspecified site: Secondary | ICD-10-CM | POA: Diagnosis not present

## 2016-04-10 DIAGNOSIS — M7989 Other specified soft tissue disorders: Secondary | ICD-10-CM | POA: Diagnosis present

## 2016-04-10 DIAGNOSIS — Z79899 Other long term (current) drug therapy: Secondary | ICD-10-CM | POA: Diagnosis not present

## 2016-04-10 DIAGNOSIS — R609 Edema, unspecified: Secondary | ICD-10-CM

## 2016-04-10 MED ORDER — OXYCODONE-ACETAMINOPHEN 5-325 MG PO TABS
1.0000 | ORAL_TABLET | Freq: Once | ORAL | Status: AC
  Administered 2016-04-10: 1 via ORAL
  Filled 2016-04-10: qty 1

## 2016-04-10 NOTE — ED Notes (Signed)
Pt c/o redness to left lower leg

## 2016-04-10 NOTE — ED Provider Notes (Signed)
CSN: [id number]     Arrival date & time 04/10/16  1927 History  By signing my name below, I, [name], attest that this documentation has been prepared under the direction and in the presence of No att. providers found.   Electronically Signed: Nicole Barnes, ED Scribe. 04/11/2016. 5:32 AM   Chief Complaint  Patient presents with  . Leg Swelling   The history is provided by the patient. No language interpreter was used.   HPI Comments: [name] is a 75 y.o. female with extensive PMHx as noted below who presents to the Emergency Department complaining of gradual onset, 8/10, left lower leg pain, ongoing for five days. Pt reports associated erythema to the area, chills, and swelling to her left foot. She also complains of difficulty walking due to the pain. She states her symptoms appear similarly to her previous episodes of gout. No other associated symptoms noted. Walking exacerbates the pain and makes it a 9/10. No other worsening or alleviating factors noted. Pt denies recent alcohol use, hx of blood clots, fever, or any other pertinent symptoms.    Past Medical History  Diagnosis Date  . GERD (gastroesophageal reflux disease)   . Fibromyalgia   . Anxiety   . Depression   . Obesity, morbid (more than 100 lbs over ideal weight or BMI > 40) (HCC)   . Hyperlipemia   . Hypothyroidism   . Obstructive sleep apnea on CPAP and O2  . Migraine   . Colon polyp     TCS 2005 TICS [place], TCS/PROPOFOL 2011 SIMPLE ADENOMAS  . Allergic rhinitis   . Osteoarthritis   . Narcolepsy   . Cholecystitis   . Persistent atrial fibrillation ([place])     a. s/p DCCV 01/2012  . Diverticulosis 2005  . Internal hemorrhoids     H/o Rectal bleeding secondary to internal hemorrhoids 04/2010.  . Volume overload     uses torsemide PRN  . Fatty liver   . Gout   . Asthma     pt states she "believe it is more allergies"   Past Surgical History  Procedure Laterality Date  . Abdominal hysterectomy   FIBROIDS 1982  . Rhinoplasty    . Bladder suspension  1982  . Kidney stent  1995  . Cardioversion  02/05/2012    Procedure: CARDIOVERSION;  Surgeon: Laverda Page, MD;  [facility];  Service: Cardiovascular;  Laterality: N/A;  . Cholecystectomy  09/03/2012    Procedure: LAPAROSCOPIC CHOLECYSTECTOMY;  Surgeon: Jamesetta So, MD;  [facility];  Service: General;  Laterality: N/A;  . Colonoscopy  2005    Dr. Tamala Julian: sigmoid diverticulosis  . Colonoscopy  June 2011    Dr. Oneida Alar: internal hemorrhoids, simple adenomas, hyperplastic polyps, needs surveillance in June 2014 with overtube and Propofol  . Sphincterotomy  10/01/2012    Procedure: SPHINCTEROTOMY;  Surgeon: Daneil Dolin, MD;  [facility];  Service: Endoscopy;  Laterality: N/A;  . Removal of stones  10/01/2012    Procedure: REMOVAL OF STONES;  Surgeon: Daneil Dolin, MD;  [facility];  Service: Endoscopy;  Laterality: N/A;  . Ercp  10/01/2012    Procedure: ENDOSCOPIC RETROGRADE CHOLANGIOPANCREATOGRAPHY (ERCP);  Surgeon: Daneil Dolin, MD;  [facility];  Service: Endoscopy;  Laterality: N/A;  . Cardioversion N/A 06/09/2013    Procedure: CARDIOVERSION;  Surgeon: Laverda Page, MD;  Location: [facility] ENDOSCOPY;  Service: Cardiovascular;  Laterality: N/A;  h&p in file-[facility]  . Cardioversion N/A  08/09/2015    Procedure: CARDIOVERSION;  Surgeon: Adrian Prows, MD;  Location: [facility] ENDOSCOPY;  Service: Cardiovascular;  Laterality: N/A;  . Cardioversion N/A 09/13/2015    Procedure: CARDIOVERSION;  Surgeon: Adrian Prows, MD;  Location: [place];  Service: Cardiovascular;  Laterality: N/A;  . Colonoscopy with propofol N/A 03/19/2016    Procedure: COLONOSCOPY WITH PROPOFOL;  Surgeon: Daneil Dolin, MD;  [facility];  Service: Endoscopy;  Laterality: [id number]  . Esophagogastroduodenoscopy (egd) with propofol N/A 03/19/2016    Procedure: ESOPHAGOGASTRODUODENOSCOPY (EGD) WITH PROPOFOL;  Surgeon: Daneil Dolin, MD;   [facility];  Service: Endoscopy;  Laterality: N/A;  . Polypectomy  03/19/2016    Procedure: POLYPECTOMY;  Surgeon: Daneil Dolin, MD;  [facility];  Service: Endoscopy;;  cold snares   Family History  Problem Relation Age of Onset  . Colon cancer Mother 17  . Diabetes Mother   . Cerebral aneurysm Father   . Asthma Mother   . Asthma Maternal Aunt   . Asthma Brother   . Heart disease Mother   . Heart disease Maternal Grandfather   . Heart disease Maternal Grandmother    Social History  Substance Use Topics  . Smoking status: Former Smoker -- 0.50 packs/day for 30 years    Quit date: 10/03/1989  . Smokeless tobacco: Never Used  . Alcohol Use: No   OB History    No data available     Review of Systems  Constitutional: Positive for chills. Negative for fever.  Musculoskeletal: Positive for joint swelling and arthralgias.  Skin: Positive for color change.  All other systems reviewed and are negative.   Allergies  Demerol; Atorvastatin; and Zocor  Home Medications   Prior to Admission medications   Medication Sig Start Date End Date Taking? Authorizing Provider  albuterol (PROVENTIL,VENTOLIN) 90 MCG/ACT inhaler Inhale 2 puffs into the lungs every 6 (six) hours as needed for shortness of breath.    Yes Historical Provider, MD  apixaban (ELIQUIS) 5 MG TABS tablet Take 5 mg by mouth 2 (two) times daily.   Yes Historical Provider, MD  colchicine 0.6 MG tablet Take 0.6 mg by mouth daily.   Yes Historical Provider, MD  esomeprazole (NEXIUM) 40 MG capsule Take 40 mg by mouth daily at 12 noon.    Yes Historical Provider, MD  fluticasone (FLONASE) 50 MCG/ACT nasal spray Place 1-2 sprays into both nostrils daily as needed for allergies or rhinitis.   Yes Historical Provider, MD  furosemide (LASIX) 40 MG tablet Take 40 mg by mouth daily as needed for fluid.  12/01/15  Yes Historical Provider, MD  levothyroxine (SYNTHROID, LEVOTHROID) 50 MCG tablet Take 50 mcg by  mouth daily before breakfast.   Yes Historical Provider, MD  LORazepam (ATIVAN) 1 MG tablet Take 1.5 mg by mouth 3 (three) times daily.   Yes Historical Provider, MD  Misc Natural Products (TART CHERRY ADVANCED PO) Take 120 mLs by mouth daily. *Mixed with water* As needed for gout   Yes Historical Provider, MD  OXYGEN Inhale 2 L into the lungs at bedtime.   Yes Historical Provider, MD  potassium chloride SA (K-DUR,KLOR-CON) 20 MEQ tablet Take 20 mEq by mouth daily as needed (only takes when takes lasix).   Yes Historical Provider, MD  propranolol ER (INDERAL LA) 80 MG 24 hr capsule Take 80 mg by mouth daily.   Yes Historical Provider, MD  polyethylene glycol-electrolytes (NULYTELY/GOLYTELY) 420 g solution Take 4,000 mLs by  mouth once. Patient not taking: Reported on 04/10/2016 02/27/16   Orvil Feil, NP   BP 118/49 mmHg  Pulse 83  Temp(Src) 97.9 F (36.6 C) (Oral)  Resp 20  Ht 5\' 2"  (1.575 m)  Wt 300 lb (136.079 kg)  BMI 54.86 kg/m2  SpO2 99% Physical Exam  Constitutional: She is oriented to person, place, and time. She appears well-developed and well-nourished.  Obese  HENT:  Head: Normocephalic and atraumatic.  Cardiovascular: Normal rate, regular rhythm and normal heart sounds.   No murmur heard. Pulmonary/Chest: Effort normal and breath sounds normal. No respiratory distress. She has no wheezes.  Abdominal: Soft. Bowel sounds are normal. There is no tenderness. There is no rebound.  Musculoskeletal: She exhibits edema.  2+ left lower extremity edema, asymmetric, 2+ DP pulse, limited range of motion of the ankle and tenderness palpation about the ankle and foot as well as lower tib-fib  Neurological: She is alert and oriented to person, place, and time.  Skin: Skin is warm and dry.  Mild erythema and warmth over the anterior aspect of the left shin, no specific warmth over the joint  Psychiatric: She has a normal mood and affect.  Nursing note and vitals reviewed.   ED Course   Procedures (including critical care time) DIAGNOSTIC STUDIES: Oxygen Saturation is 99% on RA, normal by my interpretation.    COORDINATION OF CARE: 11:18 PM Discussed treatment plan with pt at bedside and pt agreed to plan.  Labs Review Labs Reviewed  BASIC METABOLIC PANEL - Abnormal; Notable for the following:    Glucose, Bld 106 (*)    All other components within normal limits  CBC WITH DIFFERENTIAL/PLATELET    Imaging Review No results found. I have personally reviewed and evaluated these images and lab results as part of my medical decision-making.   EKG Interpretation None      MDM   Final diagnoses:  Cellulitis of left lower extremity   Patient presents with swelling, pain, and redness of the left lower extremity.  Reports a history of gout and feels that this is similar to prior episodes of gout. However, clinical exam is more concerning for cellulitis. She has diffuse swelling of the foot and lower leg. No history of blood clots. She is currently on a Eliquis.  Basic labwork obtained. No evidence of leukocytosis. Discussed the patient differential diagnosis. While gout is a consideration, DVT and/or cellulitis is also consideration. Will bring the patient back for a left lower extremity ultrasound and discharged home on Keflex and treatment for gout. She was given strict return precautions.  After history, exam, and medical workup I feel the patient has been appropriately medically screened and is safe for discharge home. Pertinent diagnoses were discussed with the patient. Patient was given return precautions.   I personally performed the services described in this documentation, which was scribed in my presence. The recorded information has been reviewed and is accurate.     Merryl Hacker, MD 04/11/16 [phone]

## 2016-04-11 ENCOUNTER — Ambulatory Visit (HOSPITAL_COMMUNITY)
Admission: RE | Admit: 2016-04-11 | Discharge: 2016-04-11 | Disposition: A | Discharge status: Home / Self Care | Payer: Medicare Other | Source: Ambulatory Visit | Attending: Emergency Medicine | Admitting: Emergency Medicine

## 2016-04-11 DIAGNOSIS — R600 Localized edema: Secondary | ICD-10-CM | POA: Diagnosis not present

## 2016-04-11 DIAGNOSIS — R609 Edema, unspecified: Secondary | ICD-10-CM

## 2016-04-11 LAB — BASIC METABOLIC PANEL
Anion gap: 6 (ref 5–15)
BUN: 15 mg/dL (ref 6–20)
CALCIUM: 9.1 mg/dL (ref 8.9–10.3)
CHLORIDE: 105 mmol/L (ref 101–111)
CO2: 29 mmol/L (ref 22–32)
CREATININE: 0.71 mg/dL (ref 0.44–1.00)
GFR calc Af Amer: >60 mL/min (ref >60)
GFR calc non Af Amer: >60 mL/min (ref >60)
GLUCOSE: 106 mg/dL — AB (ref 65–99)
Potassium: 3.6 mmol/L (ref 3.5–5.1)
Sodium: 140 mmol/L (ref 135–145)

## 2016-04-11 LAB — CBC WITH DIFFERENTIAL/PLATELET
BASOS PCT: 0 %
Basophils Absolute: 0 10*3/uL (ref 0.0–0.1)
Eosinophils Absolute: 0.3 10*3/uL (ref 0.0–0.7)
Eosinophils Relative: 5 %
HEMATOCRIT: 40.5 % (ref 36.0–46.0)
HEMOGLOBIN: 12.8 g/dL (ref 12.0–15.0)
LYMPHS ABS: 1.6 10*3/uL (ref 0.7–4.0)
Lymphocytes Relative: 26 %
MCH: 28.8 pg (ref 26.0–34.0)
MCHC: 31.6 g/dL (ref 30.0–36.0)
MCV: 91.2 fL (ref 78.0–100.0)
MONO ABS: 0.6 10*3/uL (ref 0.1–1.0)
MONOS PCT: 10 %
NEUTROS ABS: 3.6 10*3/uL (ref 1.7–7.7)
Neutrophils Relative %: 59 %
Platelets: 248 10*3/uL (ref 150–400)
RBC: 4.44 MIL/uL (ref 3.87–5.11)
RDW: 13 % (ref 11.5–15.5)
WBC: 6.1 10*3/uL (ref 4.0–10.5)

## 2016-04-11 NOTE — ED Notes (Signed)
Pt states understanding of care given and follow up instructions.  Pt instructed to return at 11:15am for an 11:30am lower leg [place].  Pt taken to vehicle via wheelchair to be transported by family

## 2016-04-26 ENCOUNTER — Encounter: Payer: Self-pay | Admitting: Gastroenterology

## 2016-04-26 ENCOUNTER — Encounter (INDEPENDENT_AMBULATORY_CARE_PROVIDER_SITE_OTHER): Payer: Self-pay

## 2016-04-26 ENCOUNTER — Ambulatory Visit (INDEPENDENT_AMBULATORY_CARE_PROVIDER_SITE_OTHER): Payer: Medicare Other | Admitting: Gastroenterology

## 2016-04-26 VITALS — BP 110/78 | HR 64 | Temp 97.1°F | Ht 62.0 in | Wt 293.2 lb

## 2016-04-26 DIAGNOSIS — R1011 Right upper quadrant pain: Secondary | ICD-10-CM

## 2016-04-26 DIAGNOSIS — Z86010 Personal history of colonic polyps: Secondary | ICD-10-CM | POA: Diagnosis not present

## 2016-04-26 NOTE — Assessment & Plan Note (Signed)
Surveillance in 5 years.  

## 2016-04-26 NOTE — Patient Instructions (Addendum)
I am so glad you are doing better! I have ordered the hepatitis C test just for screening.   We will see you in 6 months!

## 2016-04-26 NOTE — Progress Notes (Signed)
Referring Provider: Denny Levy, [place] Primary Care Physician:  Denny Levy, [place]  Primary GI: Dr. Gala Romney   Chief Complaint  Patient presents with  . Follow-up    abdominal pain   lower abd    HPI:   [name] is a 75 y.o. female presenting today with a history of chronic RUQ pain. CT with known fatty liver. LFTs normal. History of adenomas. Colonoscopy up-to-date and due for surveillance in 5 years. Normal esophagus with small hiatal hernia on EGD, no findings to explain chronic RUQ pain. Question abdominal wall component for pain.   Down 18 lbs since Jan 2017. Trying to eat better. Right sided pain is better. Occasional constipation and some lower abdominal discomfort occasionally. Eats a bunch of apples, which takes care of it. If really bad will take Miralax.     Past Medical History  Diagnosis Date  . GERD (gastroesophageal reflux disease)   . Fibromyalgia   . Anxiety   . Depression   . Obesity, morbid (more than 100 lbs over ideal weight or BMI > 40) (HCC)   . Hyperlipemia   . Hypothyroidism   . Obstructive sleep apnea on CPAP and O2  . Migraine   . Colon polyp     TCS 2005 TICS [place], TCS/PROPOFOL 2011 SIMPLE ADENOMAS  . Allergic rhinitis   . Osteoarthritis   . Narcolepsy   . Cholecystitis   . Persistent atrial fibrillation ([place])     a. s/p DCCV 01/2012  . Diverticulosis 2005  . Internal hemorrhoids     H/o Rectal bleeding secondary to internal hemorrhoids 04/2010.  . Volume overload     uses torsemide PRN  . Fatty liver   . Gout   . Asthma     pt states she "believe it is more allergies"    Past Surgical History  Procedure Laterality Date  . Abdominal hysterectomy  FIBROIDS 1982  . Rhinoplasty    . Bladder suspension  1982  . Kidney stent  1995  . Cardioversion  02/05/2012    Procedure: CARDIOVERSION;  Surgeon: Laverda Page, MD;  Location: [place];  Service: Cardiovascular;  Laterality: N/A;  . Cholecystectomy  09/03/2012    Procedure:  LAPAROSCOPIC CHOLECYSTECTOMY;  Surgeon: Jamesetta So, MD;  [facility];  Service: General;  Laterality: N/A;  . Colonoscopy  2005    Dr. Tamala Julian: sigmoid diverticulosis  . Colonoscopy  June 2011    Dr. Oneida Alar: internal hemorrhoids, simple adenomas, hyperplastic polyps, needs surveillance in June 2014 with overtube and Propofol  . Sphincterotomy  10/01/2012    Procedure: SPHINCTEROTOMY;  Surgeon: Daneil Dolin, MD;  [facility];  Service: Endoscopy;  Laterality: N/A;  . Removal of stones  10/01/2012    Procedure: REMOVAL OF STONES;  Surgeon: Daneil Dolin, MD;  [facility];  Service: Endoscopy;  Laterality: N/A;  . Ercp  10/01/2012    Procedure: ENDOSCOPIC RETROGRADE CHOLANGIOPANCREATOGRAPHY (ERCP);  Surgeon: Daneil Dolin, MD;  [facility];  Service: Endoscopy;  Laterality: N/A;  . Cardioversion N/A 06/09/2013    Procedure: CARDIOVERSION;  Surgeon: Laverda Page, MD;  Location: [facility] ENDOSCOPY;  Service: Cardiovascular;  Laterality: N/A;  h&p in file-[facility]  . Cardioversion N/A 08/09/2015    Procedure: CARDIOVERSION;  Surgeon: Adrian Prows, MD;  Location: [place];  Service: Cardiovascular;  Laterality: N/A;  . Cardioversion N/A 09/13/2015    Procedure: CARDIOVERSION;  Surgeon: Adrian Prows, MD;  Location: [place];  Service: Cardiovascular;  Laterality: N/A;  . Colonoscopy with propofol N/A 03/19/2016    Dr. Gala Romney: multiple small tubular adenomas, diverticulosis. Surveillance in 5 years   . Esophagogastroduodenoscopy (egd) with propofol N/A 03/19/2016    DR. Rourk: normal esohpagus with small hiatal hernia   . Polypectomy  03/19/2016    Procedure: POLYPECTOMY;  Surgeon: Daneil Dolin, MD;  [facility];  Service: Endoscopy;;  cold snares    Current Outpatient Prescriptions  Medication Sig Dispense Refill  . albuterol (PROVENTIL,VENTOLIN) 90 MCG/ACT inhaler Inhale 2 puffs into the lungs every 6 (six) hours as needed for shortness of breath.     Marland Kitchen apixaban  (ELIQUIS) 5 MG TABS tablet Take 5 mg by mouth 2 (two) times daily.    . colchicine 0.6 MG tablet Take 0.6 mg by mouth daily.    Marland Kitchen esomeprazole (NEXIUM) 40 MG capsule Take 40 mg by mouth daily at 12 noon.     . fluticasone (FLONASE) 50 MCG/ACT nasal spray Place 1-2 sprays into both nostrils daily as needed for allergies or rhinitis.    . furosemide (LASIX) 40 MG tablet Take 40 mg by mouth daily as needed for fluid.     Marland Kitchen levothyroxine (SYNTHROID, LEVOTHROID) 50 MCG tablet Take 50 mcg by mouth daily before breakfast.    . LORazepam (ATIVAN) 1 MG tablet Take 1.5 mg by mouth 3 (three) times daily.    . Misc Natural Products (TART CHERRY ADVANCED PO) Take 120 mLs by mouth daily. *Mixed with water* As needed for gout    . OXYGEN Inhale 2 L into the lungs at bedtime.    . potassium chloride SA (K-DUR,KLOR-CON) 20 MEQ tablet Take 20 mEq by mouth daily as needed (only takes when takes lasix).    . propranolol ER (INDERAL LA) 80 MG 24 hr capsule Take 80 mg by mouth daily.    . polyethylene glycol-electrolytes (NULYTELY/GOLYTELY) 420 g solution Take 4,000 mLs by mouth once. (Patient not taking: Reported on 04/10/2016) 4000 mL 0   No current facility-administered medications for this visit.    Allergies as of 04/26/2016 - Review Complete 04/26/2016  Allergen Reaction Noted  . Demerol [meperidine] Rash 09/30/2012  . Atorvastatin Other (See Comments) 11/30/2006  . Zocor [simvastatin - high dose] Other (See Comments) 01/25/2012    Family History  Problem Relation Age of Onset  . Colon cancer Mother 72  . Diabetes Mother   . Cerebral aneurysm Father   . Asthma Mother   . Asthma Maternal Aunt   . Asthma Brother   . Heart disease Mother   . Heart disease Maternal Grandfather   . Heart disease Maternal Grandmother     Social History   Social History  . Marital Status: Married    Spouse Name: N/A  . Number of Children: N/A  . Years of Education: N/A   Occupational History  . Homemaker     Social History Main Topics  . Smoking status: Former Smoker -- 0.50 packs/day for 30 years    Quit date: 10/03/1989  . Smokeless tobacco: Never Used     Comment: Quit in 1991/03/03  . Alcohol Use: No  . Drug Use: No  . Sexual Activity: No   Other Topics Concern  . None   Social History Narrative   Widowed. Husband passed away 03-02-2016         Review of Systems: Negative unless mentioned in HPI   Physical Exam: BP 110/78 mmHg  Pulse 64  Temp(Src)  97.1 F (36.2 C) (Oral)  Ht 5\' 2"  (1.575 m)  Wt 293 lb 3.2 oz (132.995 kg)  BMI 53.61 kg/m2 General:   Alert and oriented. No distress noted. Pleasant and cooperative.  Head:  Normocephalic and atraumatic. Eyes:  Conjuctiva clear without scleral icterus. Abdomen:  +BS, soft, obese, mild TTP RUQ. Difficult to appreciate any organomegaly due to body habitus.  Msk:  Symmetrical without gross deformities. Normal posture. Extremities:  Without edema. Neurologic:  Alert and  oriented x4 Psych:  Alert and cooperative. Normal mood and affect.

## 2016-04-26 NOTE — Assessment & Plan Note (Signed)
Much improved discomfort. EGD unrevealing. Likely abdominal wall pain. Interesting that notable improvement shown in symptoms with weight loss. Encouraged to continue this healthy lifestyle. Return in 6 months or sooner if needed.

## 2016-04-27 LAB — HEPATITIS C ANTIBODY: HCV AB: NEGATIVE

## 2016-04-30 NOTE — Progress Notes (Signed)
CC'D TO PCP °

## 2016-06-05 DIAGNOSIS — Z6841 Body Mass Index (BMI) 40.0 and over, adult: Secondary | ICD-10-CM | POA: Diagnosis not present

## 2016-06-05 DIAGNOSIS — E039 Hypothyroidism, unspecified: Secondary | ICD-10-CM | POA: Diagnosis not present

## 2016-06-05 DIAGNOSIS — F330 Major depressive disorder, recurrent, mild: Secondary | ICD-10-CM | POA: Diagnosis not present

## 2016-06-05 DIAGNOSIS — I4891 Unspecified atrial fibrillation: Secondary | ICD-10-CM | POA: Diagnosis not present

## 2016-06-27 DIAGNOSIS — Z6841 Body Mass Index (BMI) 40.0 and over, adult: Secondary | ICD-10-CM | POA: Diagnosis not present

## 2016-06-27 DIAGNOSIS — R300 Dysuria: Secondary | ICD-10-CM | POA: Diagnosis not present

## 2016-07-10 DIAGNOSIS — H903 Sensorineural hearing loss, bilateral: Secondary | ICD-10-CM | POA: Diagnosis not present

## 2016-07-10 DIAGNOSIS — F419 Anxiety disorder, unspecified: Secondary | ICD-10-CM | POA: Diagnosis not present

## 2016-07-10 DIAGNOSIS — Z87891 Personal history of nicotine dependence: Secondary | ICD-10-CM | POA: Diagnosis not present

## 2016-07-10 DIAGNOSIS — H9313 Tinnitus, bilateral: Secondary | ICD-10-CM | POA: Diagnosis not present

## 2016-07-20 ENCOUNTER — Other Ambulatory Visit: Payer: Self-pay

## 2016-07-20 DIAGNOSIS — H903 Sensorineural hearing loss, bilateral: Secondary | ICD-10-CM | POA: Diagnosis not present

## 2016-07-20 DIAGNOSIS — H9313 Tinnitus, bilateral: Secondary | ICD-10-CM | POA: Diagnosis not present

## 2016-09-04 DIAGNOSIS — M109 Gout, unspecified: Secondary | ICD-10-CM | POA: Diagnosis not present

## 2016-09-04 DIAGNOSIS — Z6841 Body Mass Index (BMI) 40.0 and over, adult: Secondary | ICD-10-CM | POA: Diagnosis not present

## 2016-09-04 DIAGNOSIS — E039 Hypothyroidism, unspecified: Secondary | ICD-10-CM | POA: Diagnosis not present

## 2016-09-04 DIAGNOSIS — I480 Paroxysmal atrial fibrillation: Secondary | ICD-10-CM | POA: Diagnosis not present

## 2016-09-04 DIAGNOSIS — F330 Major depressive disorder, recurrent, mild: Secondary | ICD-10-CM | POA: Diagnosis not present

## 2016-09-04 DIAGNOSIS — Z23 Encounter for immunization: Secondary | ICD-10-CM | POA: Diagnosis not present

## 2016-09-12 ENCOUNTER — Other Ambulatory Visit (HOSPITAL_BASED_OUTPATIENT_CLINIC_OR_DEPARTMENT_OTHER): Payer: Self-pay

## 2016-09-12 DIAGNOSIS — G47429 Narcolepsy in conditions classified elsewhere without cataplexy: Secondary | ICD-10-CM

## 2016-09-12 DIAGNOSIS — G4733 Obstructive sleep apnea (adult) (pediatric): Secondary | ICD-10-CM

## 2016-09-18 ENCOUNTER — Telehealth: Payer: Self-pay | Admitting: Internal Medicine

## 2016-09-18 DIAGNOSIS — R1011 Right upper quadrant pain: Secondary | ICD-10-CM

## 2016-09-18 NOTE — Telephone Encounter (Signed)
Patient called asking to speak with AB. I told her that AB was with patients and I could take a message for her. Patient side that she is having bad right flank pain and it's been going on for some time now and is getting worse. She wanted to know what AB would recommend. RMR has an opening today at 330 if needed. Please call patient at [phone]

## 2016-09-18 NOTE — Telephone Encounter (Signed)
Spoke with the pt- she is having R side abd pain, that has been going on for 1 month. She said it feels like it has gotten worse. No N/V, no fever, no blood in her stool, no diarrhea. Some constipation at times but takes miralax and it helps. She said the pain gets unbearable at times. Spoke with AB- pt needs HFP and lipase today and ov with AB tomorrow at 3:30. Pt is aware of this, lab orders are done and pt is on the schedule for tomorrow.

## 2016-09-19 ENCOUNTER — Encounter: Payer: Self-pay | Admitting: Gastroenterology

## 2016-09-19 ENCOUNTER — Ambulatory Visit: Payer: Medicare Other | Admitting: Gastroenterology

## 2016-09-19 VITALS — BP 134/73 | HR 85 | Temp 97.4°F | Ht 62.0 in | Wt 279.8 lb

## 2016-09-19 DIAGNOSIS — R1011 Right upper quadrant pain: Secondary | ICD-10-CM

## 2016-09-19 LAB — HEPATIC FUNCTION PANEL
ALBUMIN: 3.7 g/dL (ref 3.6–5.1)
ALT: 15 U/L (ref 6–29)
AST: 14 U/L (ref 10–35)
Alkaline Phosphatase: 80 U/L (ref 33–130)
BILIRUBIN TOTAL: 0.6 mg/dL (ref 0.2–1.2)
Bilirubin, Direct: 0.1 mg/dL (ref <=0.2)
Indirect Bilirubin: 0.5 mg/dL (ref 0.2–1.2)
Total Protein: 6.2 g/dL (ref 6.1–8.1)

## 2016-09-19 LAB — LIPASE: LIPASE: 14 U/L (ref 7–60)

## 2016-09-19 NOTE — Patient Instructions (Signed)
We have scheduled you for an MRI.   Further recommendations to follow!

## 2016-09-20 ENCOUNTER — Ambulatory Visit (HOSPITAL_COMMUNITY)
Admission: RE | Admit: 2016-09-20 | Discharge: 2016-09-20 | Disposition: A | Discharge status: Home / Self Care | Payer: Medicare Other | Source: Ambulatory Visit | Attending: Gastroenterology | Admitting: Gastroenterology

## 2016-09-20 ENCOUNTER — Telehealth: Payer: Self-pay | Admitting: Gastroenterology

## 2016-09-20 ENCOUNTER — Encounter (HOSPITAL_COMMUNITY): Payer: Self-pay

## 2016-09-20 DIAGNOSIS — R1011 Right upper quadrant pain: Secondary | ICD-10-CM

## 2016-09-20 DIAGNOSIS — I482 Chronic atrial fibrillation: Secondary | ICD-10-CM | POA: Diagnosis not present

## 2016-09-20 NOTE — Telephone Encounter (Signed)
Patient was unable to complete MRI due to body habitus. Can we schedule for open MRI in [place]? I didn't think it would be a problem, because she has had one before here at [facility]. So sorry that was an issue with her.

## 2016-09-21 MED ORDER — HYOSCYAMINE SULFATE 0.125 MG SL SUBL: 0.1250 mg | SUBLINGUAL_TABLET | SUBLINGUAL | 0 refills | Status: DC | PRN

## 2016-09-21 NOTE — Telephone Encounter (Signed)
Contacted patient and made sure she was ok. She was thankful to hear from [place]. She will await word from [place].

## 2016-09-21 NOTE — Progress Notes (Signed)
Referring Provider: Denny Levy, [place] Primary Care Physician:  Denny Levy, [place]  Primary GI: Dr. Gala Romney   Chief Complaint  Patient presents with  . Abdominal Pain    RUQ radiates in to back    HPI:   [name] is a 75 y.o. female presenting today with a history of chronic RUQ pain. CT with known fatty liver. LFTs normal. History of adenomas. Colonoscopy up-to-date and due for surveillance in 5 years. Normal esophagus with small hiatal hernia on EGD, no findings to explain chronic RUQ pain. Question abdominal wall component for pain in the past. She was doing better in June after purposeful weight loss.   Presents today in an urgent appt due to acute on chronic RUQ pain. Notes constant pain for the past week. Eats beets, which help to ease the pain. Anything else that she eats will make it worse. RUQ "puffs up" per patient. Feels like a "belt" wrapping around her back and upper abdomen. States she has seen clay-colored stool.   Past Medical History:  Diagnosis Date  . Allergic rhinitis   . Anxiety   . Asthma    pt states she "believe it is more allergies"  . Cholecystitis   . Colon polyp    TCS 2005 TICS [place], TCS/PROPOFOL 2011 SIMPLE ADENOMAS  . Depression   . Diverticulosis 2005  . Fatty liver   . Fibromyalgia   . GERD (gastroesophageal reflux disease)   . Gout   . Hyperlipemia   . Hypothyroidism   . Internal hemorrhoids    H/o Rectal bleeding secondary to internal hemorrhoids 04/2010.  . Migraine   . Narcolepsy   . Obesity, morbid (more than 100 lbs over ideal weight or BMI > 40) (HCC)   . Obstructive sleep apnea on CPAP and O2  . Osteoarthritis   . Persistent atrial fibrillation ([place])    a. s/p DCCV 01/2012  . Volume overload    uses torsemide PRN    Past Surgical History:  Procedure Laterality Date  . ABDOMINAL HYSTERECTOMY  FIBROIDS 1982  . BLADDER SUSPENSION  1982  . CARDIOVERSION  02/05/2012   Procedure: CARDIOVERSION;  Surgeon: Laverda Page, MD;   Location: [facility];  Service: Cardiovascular;  Laterality: N/A;  . CARDIOVERSION N/A 06/09/2013   Procedure: CARDIOVERSION;  Surgeon: Laverda Page, MD;  Location: [facility] ENDOSCOPY;  Service: Cardiovascular;  Laterality: N/A;  h&p in file-[place]  . CARDIOVERSION N/A 08/09/2015   Procedure: CARDIOVERSION;  Surgeon: Adrian Prows, MD;  Location: [facility] ENDOSCOPY;  Service: Cardiovascular;  Laterality: N/A;  . CARDIOVERSION N/A 09/13/2015   Procedure: CARDIOVERSION;  Surgeon: Adrian Prows, MD;  Location: [facility] ENDOSCOPY;  Service: Cardiovascular;  Laterality: N/A;  . CHOLECYSTECTOMY  09/03/2012   Procedure: LAPAROSCOPIC CHOLECYSTECTOMY;  Surgeon: Jamesetta So, MD;  Location: [facility];  Service: General;  Laterality: N/A;  . COLONOSCOPY  2005   Dr. Tamala Julian: sigmoid diverticulosis  . COLONOSCOPY  June 2011   Dr. Oneida Alar: internal hemorrhoids, simple adenomas, hyperplastic polyps, needs surveillance in June 2014 with overtube and Propofol  . COLONOSCOPY WITH PROPOFOL N/A 03/19/2016   Dr. Gala Romney: multiple small tubular adenomas, diverticulosis. Surveillance in 5 years   . ERCP  10/01/2012   Procedure: ENDOSCOPIC RETROGRADE CHOLANGIOPANCREATOGRAPHY (ERCP);  Surgeon: Daneil Dolin, MD;  Location: [facility];  Service: Endoscopy;  Laterality: N/A;  . ESOPHAGOGASTRODUODENOSCOPY (EGD) WITH PROPOFOL N/A 03/19/2016   DR. Rourk: normal esohpagus with small hiatal hernia   . Kidney Stent  1995  .  POLYPECTOMY  03/19/2016   Procedure: POLYPECTOMY;  Surgeon: Daneil Dolin, MD;  Location: [facility];  Service: Endoscopy;;  cold snares  . REMOVAL OF STONES  10/01/2012   Procedure: REMOVAL OF STONES;  Surgeon: Daneil Dolin, MD;  Location: [facility];  Service: Endoscopy;  Laterality: N/A;  . RHINOPLASTY    . SPHINCTEROTOMY  10/01/2012   Procedure: SPHINCTEROTOMY;  Surgeon: Daneil Dolin, MD;  Location: [facility];  Service: Endoscopy;  Laterality: N/A;    Current Outpatient Prescriptions  Medication Sig Dispense Refill  . albuterol  (PROVENTIL,VENTOLIN) 90 MCG/ACT inhaler Inhale 2 puffs into the lungs every 6 (six) hours as needed for shortness of breath.     Marland Kitchen apixaban (ELIQUIS) 5 MG TABS tablet Take 5 mg by mouth 2 (two) times daily.    Marland Kitchen esomeprazole (NEXIUM) 40 MG capsule Take 40 mg by mouth daily at 12 noon. Takes every  3-4 days    . fluticasone (FLONASE) 50 MCG/ACT nasal spray Place 1-2 sprays into both nostrils daily as needed for allergies or rhinitis.    . furosemide (LASIX) 40 MG tablet Take 40 mg by mouth daily as needed for fluid.     Marland Kitchen levothyroxine (SYNTHROID, LEVOTHROID) 50 MCG tablet Take 50 mcg by mouth daily before breakfast.    . LORazepam (ATIVAN) 1 MG tablet Take 1.5 mg by mouth 3 (three) times daily.    . Misc Natural Products (TART CHERRY ADVANCED PO) Take 120 mLs by mouth daily. *Mixed with water* As needed for gout    . OXYGEN Inhale 2 L into the lungs at bedtime.    . potassium chloride SA (K-DUR,KLOR-CON) 20 MEQ tablet Take 20 mEq by mouth daily as needed (only takes when takes lasix).    . propranolol ER (INDERAL LA) 80 MG 24 hr capsule Take 80 mg by mouth daily.    . hyoscyamine (LEVSIN SL) 0.125 MG SL tablet Place 1 tablet (0.125 mg total) under the tongue every 4 (four) hours as needed. 30 tablet 0   No current facility-administered medications for this visit.     Allergies as of 09/19/2016 - Review Complete 09/19/2016  Allergen Reaction Noted  . Demerol [meperidine] Rash 09/30/2012  . Atorvastatin Other (See Comments) 11/30/2006  . Zocor [simvastatin - high dose] Other (See Comments) 01/25/2012    Family History  Problem Relation Age of Onset  . Colon cancer Mother 22  . Diabetes Mother   . Asthma Mother   . Heart disease Mother   . Cerebral aneurysm Father   . Asthma Maternal Aunt   . Asthma Brother   . Heart disease Maternal Grandfather   . Heart disease Maternal Grandmother     Social History   Social History  . Marital status: Married    Spouse name: N/A  . Number of  children: N/A  . Years of education: N/A   Occupational History  . Homemaker    Social History Main Topics  . Smoking status: Former Smoker    Packs/day: 0.50    Years: 30.00    Quit date: 10/03/1989  . Smokeless tobacco: Never Used     Comment: Quit in 1991-03-08  . Alcohol use No  . Drug use: No  . Sexual activity: No   Other Topics Concern  . None   Social History Narrative   Widowed. Husband passed away March 07, 2016         Review of Systems: As mentioned in HPI   Physical Exam: BP  134/73   Pulse 85   Temp 97.4 F (36.3 C) (Oral)   Ht 5\' 2"  (1.575 m)   Wt 279 lb 12.8 oz (126.9 kg)   BMI 51.18 kg/m  General:   Alert and oriented. No distress noted. Pleasant and cooperative.  Head:  Normocephalic and atraumatic. Eyes:  Conjuctiva clear without scleral icterus. Mouth:  Oral mucosa pink and moist.  Heart:  S1, S2 present without murmurs Abdomen:  +BS, soft, mild TTP RUQ and non-distended. Obese.  Msk:  Symmetrical without gross deformities. Normal posture. Extremities:  Without edema. Neurologic:  Alert and  oriented x4;  grossly normal neurologically. Psych:  Alert and cooperative. Normal mood and affect.  Lab Results  Component Value Date   ALT 15 09/18/2016   AST 14 09/18/2016   ALKPHOS 80 09/18/2016   BILITOT 0.6 09/18/2016   Lab Results  Component Value Date   CREATININE 0.71 04/11/2016   BUN 15 04/11/2016   NA 140 04/11/2016   K 3.6 04/11/2016   CL 105 04/11/2016   CO2 29 04/11/2016   Lab Results  Component Value Date   WBC 6.1 04/11/2016   HGB 12.8 04/11/2016   HCT 40.5 04/11/2016   MCV 91.2 04/11/2016   PLT 248 04/11/2016   Lab Results  Component Value Date   LIPASE 14 09/18/2016

## 2016-09-21 NOTE — Telephone Encounter (Signed)
[facility] and faxed order. They will call pt to schedule.

## 2016-09-21 NOTE — Assessment & Plan Note (Signed)
75 year old female with chronic RUQ pain, now with more acute symptoms for the last 2 weeks. CT imaging on file from April 2017, performed secondary to RUQ pain. Recent LFTs, lipase normal. As of note, patient reports clay-colored stool. Prior history of ERCP due to CBD stones. Due to persistent and worsening symptoms, will proceed with MRI/MRCP for further dedicated imaging of hepatobiliary system.

## 2016-09-23 ENCOUNTER — Ambulatory Visit: Payer: Medicare Other | Attending: Family Medicine | Admitting: Neurology

## 2016-09-23 DIAGNOSIS — G47429 Narcolepsy in conditions classified elsewhere without cataplexy: Secondary | ICD-10-CM

## 2016-09-23 DIAGNOSIS — Z7901 Long term (current) use of anticoagulants: Secondary | ICD-10-CM | POA: Diagnosis not present

## 2016-09-23 DIAGNOSIS — Z79899 Other long term (current) drug therapy: Secondary | ICD-10-CM | POA: Insufficient documentation

## 2016-09-23 DIAGNOSIS — G4733 Obstructive sleep apnea (adult) (pediatric): Secondary | ICD-10-CM | POA: Diagnosis not present

## 2016-09-24 NOTE — Progress Notes (Signed)
CC'D TO PCP °

## 2016-09-26 ENCOUNTER — Ambulatory Visit: Admission: RE | Admit: 2016-09-26 | Payer: Medicare Other | Source: Ambulatory Visit

## 2016-09-26 ENCOUNTER — Other Ambulatory Visit: Payer: Self-pay | Admitting: Gastroenterology

## 2016-09-26 ENCOUNTER — Ambulatory Visit
Admission: RE | Admit: 2016-09-26 | Discharge: 2016-09-26 | Disposition: A | Discharge status: Home / Self Care | Payer: Medicare Other | Source: Ambulatory Visit | Attending: Gastroenterology | Admitting: Gastroenterology

## 2016-09-26 DIAGNOSIS — R1011 Right upper quadrant pain: Secondary | ICD-10-CM

## 2016-09-26 DIAGNOSIS — E662 Morbid (severe) obesity with alveolar hypoventilation: Secondary | ICD-10-CM | POA: Diagnosis not present

## 2016-09-26 DIAGNOSIS — K760 Fatty (change of) liver, not elsewhere classified: Secondary | ICD-10-CM | POA: Diagnosis not present

## 2016-09-26 DIAGNOSIS — R935 Abnormal findings on diagnostic imaging of other abdominal regions, including retroperitoneum: Secondary | ICD-10-CM | POA: Diagnosis not present

## 2016-09-26 DIAGNOSIS — I10 Essential (primary) hypertension: Secondary | ICD-10-CM | POA: Diagnosis not present

## 2016-09-26 DIAGNOSIS — I482 Chronic atrial fibrillation: Secondary | ICD-10-CM | POA: Diagnosis not present

## 2016-09-26 MED ORDER — GADOBENATE DIMEGLUMINE 529 MG/ML IV SOLN
20.0000 mL | Freq: Once | INTRAVENOUS | Status: AC | PRN
  Administered 2016-09-26: 20 mL via INTRAVENOUS

## 2016-09-28 NOTE — Progress Notes (Signed)
Discussed with patient. No concerning findings on MRI/MRCP. Let's check celiac serologies: TTg, IgA and IgA. Return in 3 months, please schedule

## 2016-09-29 NOTE — Procedures (Signed)
[place] A. Merlene Laughter, MD     [email or web address]             NOCTURNAL POLYSOMNOGRAPHY   LOCATION: [facility]   Patient Name: Jayciana, [place] Date: 09/23/2016 Gender: Female D.O.B: 20-Oct-1941 Age (years): 5 Referring Provider: Denny Levy PA Height (inches): 62 Interpreting Physician: Phillips Odor MD, ABSM Weight (lbs): 279 RPSGT: Rosebud Poles BMI: 51 MRN: [id number] Neck Size: 16.00 CLINICAL INFORMATION Sleep Study Type: NPSG Indication for sleep study: OSA Epworth Sleepiness Score: 19 SLEEP STUDY TECHNIQUE As per the AASM Manual for the Scoring of Sleep and Associated Events v2.3 (April 2016) with a hypopnea requiring 4% desaturations. The channels recorded and monitored were frontal, central and occipital EEG, electrooculogram (EOG), submentalis EMG (chin), nasal and oral airflow, thoracic and abdominal wall motion, anterior tibialis EMG, snore microphone, electrocardiogram, and pulse oximetry. MEDICATIONS Medications self-administered by patient taken the night of the study : N/A  Current Outpatient Prescriptions:  .  albuterol (PROVENTIL,VENTOLIN) 90 MCG/ACT inhaler, Inhale 2 puffs into the lungs every 6 (six) hours as needed for shortness of breath. , Disp: , Rfl:  .  apixaban (ELIQUIS) 5 MG TABS tablet, Take 5 mg by mouth 2 (two) times daily., Disp: , Rfl:  .  esomeprazole (NEXIUM) 40 MG capsule, Take 40 mg by mouth daily at 12 noon. Takes every  3-4 days, Disp: , Rfl:  .  fluticasone (FLONASE) 50 MCG/ACT nasal spray, Place 1-2 sprays into both nostrils daily as needed for allergies or rhinitis., Disp: , Rfl:  .  furosemide (LASIX) 40 MG tablet, Take 40 mg by mouth daily as needed for fluid. , Disp: , Rfl:  .  hyoscyamine (LEVSIN SL) 0.125 MG SL tablet, Place 1 tablet (0.125 mg total) under the tongue every 4 (four) hours as needed., Disp: 30 tablet, Rfl: 0 .  levothyroxine (SYNTHROID, LEVOTHROID) 50 MCG tablet, Take 50 mcg by mouth daily  before breakfast., Disp: , Rfl:  .  LORazepam (ATIVAN) 1 MG tablet, Take 1.5 mg by mouth 3 (three) times daily., Disp: , Rfl:  .  Misc Natural Products (TART CHERRY ADVANCED PO), Take 120 mLs by mouth daily. *Mixed with water* As needed for gout, Disp: , Rfl:  .  OXYGEN, Inhale 2 L into the lungs at bedtime., Disp: , Rfl:  .  potassium chloride SA (K-DUR,KLOR-CON) 20 MEQ tablet, Take 20 mEq by mouth daily as needed (only takes when takes lasix)., Disp: , Rfl:  .  propranolol ER (INDERAL LA) 80 MG 24 hr capsule, Take 80 mg by mouth daily., Disp: , Rfl:   SLEEP ARCHITECTURE The study was initiated at 11:09:43 PM and ended at 5:01:47 AM. Sleep onset time was 13.2 minutes and the sleep efficiency was 84.4%. The total sleep time was 297.0 minutes. Stage REM latency was 86.5 minutes. The patient spent 3.03% of the night in stage N1 sleep, 37.21% in stage N2 sleep, 32.15% in stage N3 and 27.61% in REM. Alpha intrusion was absent. Supine sleep was 0.00%. RESPIRATORY PARAMETERS The overall apnea/hypopnea index (AHI) was 8.5 per hour. There were 41 total apneas, including 40 obstructive, 0 central and 1 mixed apneas. There were 1 hypopneas and 0 RERAs. The AHI during Stage REM sleep was 30.0 per hour. AHI while supine was N/A per hour. The mean oxygen saturation was 97.36%. The minimum SpO2 during sleep was 94.00%. Loud snoring was noted during this study. CARDIAC DATA The 2 lead EKG demonstrated sinus rhythm, atrial fibrillation. The mean heart rate was  N/A beats per minute. Other EKG findings include: PVCs. LEG MOVEMENT DATA The total PLMS were 0 with a resulting PLMS index of 0.00. Associated arousal with leg movement index was 0.0.   IMPRESSIONS - This study shows mild obstructive sleep apnea not requiring positive pressure treatment.   Delano Metz, MD Diplomate, American Board of Sleep Medicine.

## 2016-10-02 ENCOUNTER — Other Ambulatory Visit: Payer: Self-pay

## 2016-10-02 ENCOUNTER — Other Ambulatory Visit: Payer: Self-pay | Admitting: Gastroenterology

## 2016-10-02 DIAGNOSIS — R1011 Right upper quadrant pain: Secondary | ICD-10-CM

## 2016-10-03 LAB — TISSUE TRANSGLUTAMINASE, IGA: Tissue Transglutaminase Ab, IgA: 1 U/mL (ref <4)

## 2016-10-03 LAB — IGA: IgA: 153 mg/dL (ref 81–463)

## 2016-10-04 ENCOUNTER — Emergency Department (HOSPITAL_COMMUNITY)
Admission: EM | Admit: 2016-10-04 | Discharge: 2016-10-04 | Disposition: A | Discharge status: Home / Self Care | Payer: Medicare Other | Attending: Emergency Medicine | Admitting: Emergency Medicine

## 2016-10-04 ENCOUNTER — Emergency Department (HOSPITAL_COMMUNITY): Payer: Medicare Other

## 2016-10-04 ENCOUNTER — Encounter (HOSPITAL_COMMUNITY): Payer: Self-pay

## 2016-10-04 ENCOUNTER — Other Ambulatory Visit: Payer: Medicare Other

## 2016-10-04 ENCOUNTER — Telehealth: Payer: Self-pay

## 2016-10-04 DIAGNOSIS — Z87891 Personal history of nicotine dependence: Secondary | ICD-10-CM | POA: Diagnosis not present

## 2016-10-04 DIAGNOSIS — M25571 Pain in right ankle and joints of right foot: Secondary | ICD-10-CM | POA: Diagnosis not present

## 2016-10-04 DIAGNOSIS — M7989 Other specified soft tissue disorders: Secondary | ICD-10-CM | POA: Diagnosis not present

## 2016-10-04 DIAGNOSIS — M109 Gout, unspecified: Secondary | ICD-10-CM

## 2016-10-04 DIAGNOSIS — Z79899 Other long term (current) drug therapy: Secondary | ICD-10-CM | POA: Insufficient documentation

## 2016-10-04 DIAGNOSIS — E039 Hypothyroidism, unspecified: Secondary | ICD-10-CM | POA: Diagnosis not present

## 2016-10-04 DIAGNOSIS — J45909 Unspecified asthma, uncomplicated: Secondary | ICD-10-CM | POA: Insufficient documentation

## 2016-10-04 DIAGNOSIS — M549 Dorsalgia, unspecified: Secondary | ICD-10-CM | POA: Insufficient documentation

## 2016-10-04 DIAGNOSIS — R1084 Generalized abdominal pain: Secondary | ICD-10-CM | POA: Insufficient documentation

## 2016-10-04 DIAGNOSIS — M10071 Idiopathic gout, right ankle and foot: Secondary | ICD-10-CM | POA: Insufficient documentation

## 2016-10-04 DIAGNOSIS — I5030 Unspecified diastolic (congestive) heart failure: Secondary | ICD-10-CM | POA: Insufficient documentation

## 2016-10-04 DIAGNOSIS — R140 Abdominal distension (gaseous): Secondary | ICD-10-CM | POA: Insufficient documentation

## 2016-10-04 DIAGNOSIS — M79671 Pain in right foot: Secondary | ICD-10-CM | POA: Diagnosis present

## 2016-10-04 HISTORY — DX: Other chronic pain: G89.29

## 2016-10-04 HISTORY — DX: Unspecified abdominal pain: R10.9

## 2016-10-04 MED ORDER — PREDNISONE 50 MG PO TABS
60.0000 mg | ORAL_TABLET | Freq: Once | ORAL | Status: AC
  Administered 2016-10-04: 60 mg via ORAL
  Filled 2016-10-04: qty 1

## 2016-10-04 MED ORDER — HYDROCODONE-ACETAMINOPHEN 5-325 MG PO TABS: 1.0000 | ORAL_TABLET | Freq: Four times a day (QID) | ORAL | 0 refills | Status: DC | PRN

## 2016-10-04 MED ORDER — PREDNISONE 10 MG PO TABS: 40.0000 mg | ORAL_TABLET | Freq: Every day | ORAL | 0 refills | Status: DC

## 2016-10-04 MED ORDER — HYDROCODONE-ACETAMINOPHEN 5-325 MG PO TABS
2.0000 | ORAL_TABLET | Freq: Once | ORAL | Status: AC
  Administered 2016-10-04: 2 via ORAL
  Filled 2016-10-04: qty 2

## 2016-10-04 NOTE — Telephone Encounter (Signed)
Pt is calling because she is in so much pain and she don't know what to do. She has the gout and is in so much pain. I advise  Her to go to the ER

## 2016-10-04 NOTE — ED Triage Notes (Signed)
Pedal pulses present, capillary refill wnl.

## 2016-10-04 NOTE — ED Notes (Addendum)
Pt alert & oriented x4, stable gait. Patient given discharge instructions, paperwork & prescription(s). Patient informed not to drive, operate any equipment & handel any important documents 4 hours after taking pain medication. Patient instructed to stop at the registration desk to finish any additional paperwork. Patient verbalized understanding. Pt left department in wheelchair escorted by staff. Pt left w/ no further questions. 

## 2016-10-04 NOTE — Discharge Instructions (Signed)
Take the prednisone as directed over the next 5 days. Take pain medicine as needed. Make an appointment to follow-up with your regular doctor. Return for any new or worse symptoms.

## 2016-10-04 NOTE — ED Triage Notes (Signed)
Pt reports pain in both ankles, r worse than left.  Reports r ankle started hurting last night but left has been hurting for " a while."  Pt says has history of gout in her ankles.  Pt also c/o abdominal bloating and r flank pain.  Reports had MRI of abd last week and was told it was normal.

## 2016-10-04 NOTE — ED Notes (Addendum)
Pt states problem w/ gout for the past few years. Pt complaining of abdominal bloating & pain, says the bloating is new. Had MRI and was told it was normal.

## 2016-10-04 NOTE — ED Provider Notes (Signed)
[facility] DEPT Provider Note   CSN: [id number] Arrival date & time: 10/04/16  1756  By signing my name below, I, Gwenlyn Fudge, attest that this documentation has been prepared under the direction and in the presence of Fredia Sorrow, MD. Electronically Signed: Gwenlyn Fudge, ED Scribe. 10/04/16. 8:46 PM.   History   Chief Complaint Chief Complaint  Patient presents with  . Ankle Pain  . Abdominal Pain   [name] is a 75 y.o. female who presents to the Emergency Department complaining of gradual onset, constant, right foot pain onset yesterday. Right foot pain is localized to the middle of the foot and ankle area. She experiences similar pain in the left ankle and foot as well. Pain is exacerbated with movement of the right ankle and toes. Pt has hx of Gout that first began in right great toe, but pt began to experience it in her left foot as well. She was taking Coltranzine daily, but due to the decreased effectiveness, she no longer takes it. Pt was recently given Allopurinol by PCP,  but has not been compliant with medication.  She also reports abdominal distension and LUQ pain that radiates across her abdomen and over to her left kidney. She currently is taking Eliquis daily.   The history is provided by the patient. No language interpreter was used.  Ankle Pain   The incident occurred yesterday. There was no injury mechanism. The pain is present in the right ankle, left ankle, right foot, left foot and right toes (Worse on right ankle). The pain is moderate. The pain has been constant since onset. She reports no foreign bodies present. The symptoms are aggravated by bearing weight and activity.   Past Medical History:  Diagnosis Date  . Allergic rhinitis   . Anxiety   . Asthma    pt states she "believe it is more allergies"  . Cholecystitis   . Chronic abdominal pain   . Colon polyp    TCS 2005 TICS [place], TCS/PROPOFOL 2011 SIMPLE [place]ENOMAS  . Depression   . Diverticulosis  2005  . Fatty liver   . Fibromyalgia   . GERD (gastroesophageal reflux disease)   . Gout   . Hyperlipemia   . Hypothyroidism   . Internal hemorrhoids    H/o Rectal bleeding secondary to internal hemorrhoids 04/2010.  . Migraine   . Narcolepsy   . Obesity, morbid (more than 100 lbs over ideal weight or BMI > 40) (HCC)   . Obstructive sleep apnea on CPAP and O2  . Osteoarthritis   . Persistent atrial fibrillation ([place])    a. s/p DCCV 01/2012  . Volume overload    uses torsemide PRN    Patient Active Problem List   Diagnosis Date Noted  . History of colonic polyps   . Diverticulosis of colon without hemorrhage   . Hiatal hernia   . RUQ abdominal pain 02/27/2016  . Dyspnea 05/03/2014  . Acute respiratory failure ([place]) 01/13/2014  . CAP (community acquired pneumonia) 01/12/2014  . RUQ pain 02/05/2013  . Rib pain on right side 02/05/2013  . Cholelithiasis 09/01/2012  . Atrial fibrillation ([place]) 09/01/2012  . Diastolic CHF ([place]) 0000000  . INTERNAL HEMORRHOIDS 11/06/2010  . FATIGUE 11/06/2010  . Abdominal pain 11/06/2010  . INTERTRIGO, CANDIDAL 06/03/2009  . VARICOSE VEINS LOWER EXTREMITIES W/INFLAMMATION 01/10/2009  . HYPOTHYROIDISM 11/30/2006  . HYPERLIPIDEMIA 11/30/2006  . OBESITY, MORBID 11/30/2006  . ANXIETY 11/30/2006  . DEPRESSION 11/30/2006  . OBSTRUCTIVE SLEEP APNEA 11/30/2006  .  MIGRAINE HEADACHE 11/30/2006  . NARCOLEPSY W/CATAPLEXY 11/30/2006  . ALLERGIC RHINITIS 11/30/2006  . ASTHMA 11/30/2006  . GERD 11/30/2006  . OSTEOARTHRITIS 11/30/2006  . FIBROMYALGIA 11/30/2006  . PALPITATIONS 11/30/2006  . COLONIC POLYPS, HX OF 11/30/2006    Past Surgical History:  Procedure Laterality Date  . ABDOMINAL HYSTERECTOMY  FIBROIDS 1982  . BLADDER SUSPENSION  1982  . CARDIOVERSION  02/05/2012   Procedure: CARDIOVERSION;  Surgeon: Laverda Page, MD;  Location: [place];  Service: Cardiovascular;  Laterality: N/A;  . CARDIOVERSION N/A 06/09/2013   Procedure:  CARDIOVERSION;  Surgeon: Laverda Page, MD;  Location: [facility] ENDOSCOPY;  Service: Cardiovascular;  Laterality: N/A;  h&p in file-[facility]  . CARDIOVERSION N/A 08/09/2015   Procedure: CARDIOVERSION;  Surgeon: Adrian Prows, MD;  Location: [facility] ENDOSCOPY;  Service: Cardiovascular;  Laterality: N/A;  . CARDIOVERSION N/A 09/13/2015   Procedure: CARDIOVERSION;  Surgeon: Adrian Prows, MD;  Location: [facility] ENDOSCOPY;  Service: Cardiovascular;  Laterality: N/A;  . CHOLECYSTECTOMY  09/03/2012   Procedure: LAPAROSCOPIC CHOLECYSTECTOMY;  Surgeon: Jamesetta So, MD;  Location: [facility];  Service: General;  Laterality: N/A;  . COLONOSCOPY  2005   Dr. Tamala Julian: sigmoid diverticulosis  . COLONOSCOPY  June 2011   Dr. Oneida Alar: internal hemorrhoids, simple adenomas, hyperplastic polyps, needs surveillance in June 2014 with overtube and Propofol  . COLONOSCOPY WITH PROPOFOL N/A 03/19/2016   Dr. Gala Romney: multiple small tubular adenomas, diverticulosis. Surveillance in 5 years   . ERCP  10/01/2012   Procedure: ENDOSCOPIC RETROGRADE CHOLANGIOPANCREATOGRAPHY (ERCP);  Surgeon: Daneil Dolin, MD;  Location: [facility];  Service: Endoscopy;  Laterality: N/A;  . ESOPHAGOGASTRODUODENOSCOPY (EGD) WITH PROPOFOL N/A 03/19/2016   DR. Rourk: normal esohpagus with small hiatal hernia   . Kidney Stent  1995  . POLYPECTOMY  03/19/2016   Procedure: POLYPECTOMY;  Surgeon: Daneil Dolin, MD;  [facility];  Service: Endoscopy;;  cold snares  . REMOVAL OF STONES  10/01/2012   Procedure: REMOVAL OF STONES;  Surgeon: Daneil Dolin, MD;  Location: [facility];  Service: Endoscopy;  Laterality: N/A;  . RHINOPLASTY    . SPHINCTEROTOMY  10/01/2012   Procedure: SPHINCTEROTOMY;  Surgeon: Daneil Dolin, MD;  Location: [facility];  Service: Endoscopy;  Laterality: N/A;    OB History    No data available       Home Medications    Prior to Admission medications   Medication Sig Start Date End Date Taking? Authorizing Provider  albuterol (PROVENTIL,VENTOLIN)  90 MCG/ACT inhaler Inhale 2 puffs into the lungs every 6 (six) hours as needed for shortness of breath.    Yes Historical Provider, MD  allopurinol (ZYLOPRIM) 100 MG tablet Take 100 mg by mouth daily.   Yes Historical Provider, MD  apixaban (ELIQUIS) 5 MG TABS tablet Take 5 mg by mouth 2 (two) times daily.   Yes Historical Provider, MD  esomeprazole (NEXIUM) 40 MG capsule Take 40 mg by mouth daily at 12 noon. Takes every  3-4 days   Yes Historical Provider, MD  FLUoxetine (PROZAC) 20 MG capsule Take 20 mg by mouth 3 (three) times daily.   Yes Historical Provider, MD  fluticasone (FLONASE) 50 MCG/ACT nasal spray Place 1-2 sprays into both nostrils daily as needed for allergies or rhinitis.   Yes Historical Provider, MD  furosemide (LASIX) 40 MG tablet Take 40 mg by mouth 2 (two) times daily as needed for fluid.  12/01/15  Yes Historical Provider, MD  hyoscyamine (LEVSIN SL) 0.125 MG SL  tablet Place 1 tablet (0.125 mg total) under the tongue every 4 (four) hours as needed. 09/21/16  Yes Annitta Needs, NP  levothyroxine (SYNTHROID, LEVOTHROID) 50 MCG tablet Take 50 mcg by mouth daily before breakfast.   Yes Historical Provider, MD  LORazepam (ATIVAN) 1 MG tablet Take 1.5 mg by mouth 3 (three) times daily.   Yes Historical Provider, MD  Misc Natural Products (TART CHERRY ADVANCED PO) Take 120 mLs by mouth daily. *Mixed with water* As needed for gout   Yes Historical Provider, MD  OXYGEN Inhale 2 L into the lungs at bedtime.   Yes Historical Provider, MD  potassium chloride SA (K-DUR,KLOR-CON) 20 MEQ tablet Take 20 mEq by mouth daily as needed (only takes when takes lasix).   Yes Historical Provider, MD  propranolol ER (INDERAL LA) 80 MG 24 hr capsule Take 80 mg by mouth daily.   Yes Historical Provider, MD  HYDROcodone-acetaminophen (NORCO/VICODIN) 5-325 MG tablet Take 1-2 tablets by mouth every 6 (six) hours as needed for moderate pain. 10/04/16   Fredia Sorrow, MD  predniSONE (DELTASONE) 10 MG tablet Take  4 tablets (40 mg total) by mouth daily. 10/04/16   Fredia Sorrow, MD    Family History Family History  Problem Relation Age of Onset  . Colon cancer Mother 65  . Diabetes Mother   . Asthma Mother   . Heart disease Mother   . Cerebral aneurysm Father   . Asthma Maternal Aunt   . Asthma Brother   . Heart disease Maternal Grandfather   . Heart disease Maternal Grandmother     Social History Social History  Substance Use Topics  . Smoking status: Former Smoker    Packs/day: 0.50    Years: 30.00    Quit date: 10/03/1989  . Smokeless tobacco: Never Used     Comment: Quit in 1992  . Alcohol use No     Allergies   Demerol [meperidine]; Atorvastatin; and Zocor [simvastatin - high dose]   Review of Systems Review of Systems  Constitutional: Positive for chills. Negative for fever.  HENT: Negative for congestion and rhinorrhea.   Eyes: Negative for visual disturbance.  Respiratory: Negative for cough and shortness of breath.   Cardiovascular: Negative for chest pain.  Gastrointestinal: Positive for abdominal distention and abdominal pain. Negative for diarrhea, nausea and vomiting.  Genitourinary: Positive for flank pain. Negative for dysuria and hematuria.  Musculoskeletal: Positive for arthralgias, back pain and joint swelling.  Skin: Negative for rash.  Neurological: Negative for headaches.  Hematological: Bruises/bleeds easily.  Psychiatric/Behavioral: Negative for confusion.   Physical Exam Updated Vital Signs BP (!) 116/41 (BP Location: Left Arm)   Pulse 78   Temp 97.7 F (36.5 C) (Temporal)   Resp 17   Ht 5\' 2"  (1.575 m)   Wt 126.1 kg   SpO2 98%   BMI 50.85 kg/m   Physical Exam  Constitutional: She is oriented to person, place, and time. She appears well-developed and well-nourished.  HENT:  Head: Normocephalic.  Mouth/Throat: Oropharynx is clear and moist.  Eyes: Conjunctivae and EOM are normal. Pupils are equal, round, and reactive to light.  Normal  sclera  Cardiovascular: Normal rate and regular rhythm.   Pulmonary/Chest: Effort normal and breath sounds normal. No respiratory distress. She has no wheezes. She has no rales.  Abdominal: Bowel sounds are normal. She exhibits no distension. There is tenderness (generalized in upper quadrants). There is no rebound and no guarding.  Musculoskeletal: Normal range of motion.  Right  and left leg trace edema  Neurological: She is alert and oriented to person, place, and time. No cranial nerve deficit or sensory deficit. She exhibits normal muscle tone. Coordination normal.  Skin: Skin is warm and dry.  Warmth in right leg around ankle and increased warmth around great toe Cap refill in right great toe is < 1s  Psychiatric: She has a normal mood and affect. Her behavior is normal.  Nursing note and vitals reviewed.  ED Treatments / Results  DIAGNOSTIC STUDIES: Oxygen Saturation is 98% on RA, normal by my interpretation.    COORDINATION OF CARE: 8:29 PM Discussed treatment plan with pt at bedside which includes DG Ankle and Hydrocodone and pt agreed to plan.  Labs (all labs ordered are listed, but only abnormal results are displayed) Labs Reviewed - No data to display  EKG  EKG Interpretation None       Radiology Dg Ankle Complete Right  Result Date: 10/04/2016 CLINICAL DATA:  Pain. Severe pain in right foot and ankle since yesterday. No known injury. History of gout. EXAM: RIGHT ANKLE - COMPLETE 3+ VIEW COMPARISON:  None. FINDINGS: There is diffuse mild subcutaneous edema. No acute fracture or subluxation. No radiopaque foreign body or soft tissue gas. Small plantar spur is present. IMPRESSION: Soft tissue swelling/subcutaneous edema. No evidence for acute osseous abnormality. Electronically Signed   By: Nolon Nations M.D.   On: 10/04/2016 21:30   Dg Foot Complete Right  Result Date: 10/04/2016 CLINICAL DATA:  Pt c/o severe pain in Rt foot and ankle since yesterday without injury.  Hx gout. EXAM: RIGHT FOOT COMPLETE - 3+ VIEW COMPARISON:  None. FINDINGS: There is no evidence for acute fracture or subluxation. Diffuse soft tissue swelling is noted. No osseous erosions or soft tissue calcifications. Note is made of plantar spur. IMPRESSION: No evidence for acute osseous abnormality.  Soft tissue swelling. Electronically Signed   By: Nolon Nations M.D.   On: 10/04/2016 21:28    Procedures Procedures (including critical care time)  Medications Ordered in ED Medications  predniSONE (DELTASONE) tablet 60 mg (60 mg Oral Given 10/04/16 2055)  HYDROcodone-acetaminophen (NORCO/VICODIN) 5-325 MG per tablet 2 tablet (2 tablets Oral Given 10/04/16 2055)     Initial Impression / Assessment and Plan / ED Course  I have reviewed the triage vital signs and the nursing notes.  Pertinent labs & imaging results that were available during my care of the patient were reviewed by me and considered in my medical decision making (see chart for details).  Clinical Course    I personally performed the services described in this documentation, which was scribed in my presence. The recorded information has been reviewed and is accurate.   Symptoms clinically consistent with gout involving the right ankle. X-rays without any acute abnormalities. Patient was significant improvement here with hydrocodone. First dose of prednisone provided. Will be continued on prednisone and hydrocodone. Patient does have follow-up with her primary care doctor.  Patient's abdominal pain is chronic in nature extensive workup by GI medicine. No further workup required here.   Final Clinical Impressions(s) / ED Diagnoses   Final diagnoses:  Acute gout of right ankle, unspecified cause    New Prescriptions New Prescriptions   HYDROCODONE-ACETAMINOPHEN (NORCO/VICODIN) 5-325 MG TABLET    Take 1-2 tablets by mouth every 6 (six) hours as needed for moderate pain.   PREDNISONE (DELTASONE) 10 MG TABLET    Take 4  tablets (40 mg total) by mouth daily.  Fredia Sorrow, MD 10/04/16 2306

## 2016-10-08 NOTE — Telephone Encounter (Signed)
Late entry: agree.

## 2016-10-10 NOTE — Progress Notes (Signed)
Celiac serologies negative

## 2016-10-16 DIAGNOSIS — N3946 Mixed incontinence: Secondary | ICD-10-CM | POA: Diagnosis not present

## 2016-10-16 DIAGNOSIS — F330 Major depressive disorder, recurrent, mild: Secondary | ICD-10-CM | POA: Diagnosis not present

## 2016-10-16 DIAGNOSIS — R300 Dysuria: Secondary | ICD-10-CM | POA: Diagnosis not present

## 2016-10-16 DIAGNOSIS — M109 Gout, unspecified: Secondary | ICD-10-CM | POA: Diagnosis not present

## 2016-10-16 DIAGNOSIS — R309 Painful micturition, unspecified: Secondary | ICD-10-CM | POA: Diagnosis not present

## 2016-10-16 DIAGNOSIS — Z6841 Body Mass Index (BMI) 40.0 and over, adult: Secondary | ICD-10-CM | POA: Diagnosis not present

## 2016-10-30 ENCOUNTER — Ambulatory Visit: Payer: Medicare Other | Admitting: Gastroenterology

## 2016-11-08 ENCOUNTER — Ambulatory Visit (INDEPENDENT_AMBULATORY_CARE_PROVIDER_SITE_OTHER): Payer: Medicare Other | Admitting: Gastroenterology

## 2016-11-08 ENCOUNTER — Other Ambulatory Visit: Payer: Self-pay

## 2016-11-08 ENCOUNTER — Encounter: Payer: Self-pay | Admitting: Gastroenterology

## 2016-11-08 VITALS — BP 94/59 | HR 73 | Temp 97.3°F | Ht 62.0 in | Wt 271.8 lb

## 2016-11-08 DIAGNOSIS — R1011 Right upper quadrant pain: Secondary | ICD-10-CM

## 2016-11-08 DIAGNOSIS — R109 Unspecified abdominal pain: Secondary | ICD-10-CM

## 2016-11-08 LAB — HEPATIC FUNCTION PANEL
ALBUMIN: 3.8 g/dL (ref 3.6–5.1)
ALK PHOS: 76 U/L (ref 33–130)
ALT: 17 U/L (ref 6–29)
AST: 15 U/L (ref 10–35)
BILIRUBIN DIRECT: 0.1 mg/dL (ref <=0.2)
BILIRUBIN INDIRECT: 0.5 mg/dL (ref 0.2–1.2)
BILIRUBIN TOTAL: 0.6 mg/dL (ref 0.2–1.2)
Total Protein: 6.2 g/dL (ref 6.1–8.1)

## 2016-11-08 NOTE — Progress Notes (Signed)
Referring Provider: Denny Levy, [place] Primary Care Physician:  Denny Levy, [place] Primary GI: Dr. Gala Romney   Chief Complaint  Patient presents with  . Abdominal Pain    HPI:   [name] is a 75 y.o. female presenting today with a history of chronic RUQ pain. CT with known fatty liver. LFTs normal. History of adenomas. Colonoscopy up-to-date and due for surveillance in 5 years. Normal esophagus with small hiatal hernia on EGD, no findings to explain chronic RUQ pain. Question abdominal wall component for pain in the past. She was doing better in June after purposeful weight loss. MRI in Nov 2017 with fatty liver, normal biliary tree, normal pancreas.   Felt bloated yesterday, pain all across abdomen. White colored stools. LFTs have remained normal. MRI unremarkable. Started on Uloric for gout about 3 weeks ago. Treated for bladder infection X 2 with nitrofurantoin in Nov and October. Has been prescribed Levsin and says this helped slightly. Has problems with edema and takes lasix but it's better now. Beets improve abdominal pain. Weight continues to decrease.   Granddaughter states she has worsening pain when she is alone. Patient does not like to be alone at all. Tearful today. Beets make her feel better. Lost her husband earlier this year and continues to grieve his loss.     Past Medical History:  Diagnosis Date  . Allergic rhinitis   . Anxiety   . Asthma    pt states she "believe it is more allergies"  . Cholecystitis   . Chronic abdominal pain   . Colon polyp    TCS 2005 TICS [place], TCS/PROPOFOL 2011 SIMPLE [place]OM[place]  . Depression   . Diverticulosis 2005  . Fatty liver   . Fibromyalgia   . GERD (gastroesophageal reflux disease)   . Gout   . Hyperlipemia   . Hypothyroidism   . Internal hemorrhoids    H/o Rectal bleeding secondary to internal hemorrhoids 04/2010.  . Migraine   . Narcolepsy   . Obesity, morbid (more than 100 lbs over ideal weight or BMI > 40) (HCC)   .  Obstructive sleep apnea on CPAP and O2  . Osteoarthritis   . Persistent atrial fibrillation ([place])    a. s/p DCCV 01/2012  . Volume overload    uses torsemide PRN    Past Surgical History:  Procedure Laterality Date  . ABDOMINAL HYSTERECTOMY  FIBROIDS 1982  . BLADDER SUSPENSION  1982  . CARDIOVERSION  02/05/2012   Procedure: CARDIOVERSION;  Surgeon: Laverda Page, MD;  Location: [place];  Service: Cardiovascular;  Laterality: N/A;  . CARDIOVERSION N/A 06/09/2013   Procedure: CARDIOVERSION;  Surgeon: Laverda Page, MD;  Location: [facility] ENDOSCOPY;  Service: Cardiovascular;  Laterality: N/A;  h&p in file-[facility]  . CARDIOVERSION N/A 08/09/2015   Procedure: CARDIOVERSION;  Surgeon: Adrian Prows, MD;  Location: [facility] ENDOSCOPY;  Service: Cardiovascular;  Laterality: N/A;  . CARDIOVERSION N/A 09/13/2015   Procedure: CARDIOVERSION;  Surgeon: Adrian Prows, MD;  Location: [facility] ENDOSCOPY;  Service: Cardiovascular;  Laterality: N/A;  . CHOLECYSTECTOMY  09/03/2012   Procedure: LAPAROSCOPIC CHOLECYSTECTOMY;  Surgeon: Jamesetta So, MD;  Location: [facility];  Service: General;  Laterality: N/A;  . COLONOSCOPY  2005   Dr. Tamala Julian: sigmoid diverticulosis  . COLONOSCOPY  June 2011   Dr. Oneida Alar: internal hemorrhoids, simple adenomas, hyperplastic polyps, needs surveillance in June 2014 with overtube and Propofol  . COLONOSCOPY WITH PROPOFOL N/A 03/19/2016   Dr. Gala Romney: multiple small tubular adenomas, diverticulosis. Surveillance  in 5 years   . ERCP  10/01/2012   Procedure: ENDOSCOPIC RETROGRADE CHOLANGIOPANCREATOGRAPHY (ERCP);  Surgeon: Daneil Dolin, MD;  Location: [facility];  Service: Endoscopy;  Laterality: N/A;  . ESOPHAGOGASTRODUODENOSCOPY (EGD) WITH PROPOFOL N/A 03/19/2016   DR. Rourk: normal esohpagus with small hiatal hernia   . Kidney Stent  1995  . POLYPECTOMY  03/19/2016   Procedure: POLYPECTOMY;  Surgeon: Daneil Dolin, MD;  [facility];  Service: Endoscopy;;  cold snares  . REMOVAL OF STONES   10/01/2012   Procedure: REMOVAL OF STONES;  Surgeon: Daneil Dolin, MD;  Location[facility];  Service: Endoscopy;  Laterality: N/A;  . RHINOPLASTY    . SPHINCTEROTOMY  10/01/2012   Procedure: SPHINCTEROTOMY;  Surgeon: Daneil Dolin, MD;  Location: [facility];  Service: Endoscopy;  Laterality: N/A;    Current Outpatient Prescriptions  Medication Sig Dispense Refill  . albuterol (PROVENTIL,VENTOLIN) 90 MCG/ACT inhaler Inhale 2 puffs into the lungs every 6 (six) hours as needed for shortness of breath.     . allopurinol (ZYLOPRIM) 100 MG tablet Take 100 mg by mouth daily.    Marland Kitchen apixaban (ELIQUIS) 5 MG TABS tablet Take 5 mg by mouth 2 (two) times daily.    Marland Kitchen esomeprazole (NEXIUM) 40 MG capsule Take 40 mg by mouth daily at 12 noon. Takes every  3-4 days    . FLUoxetine (PROZAC) 20 MG capsule Take 20 mg by mouth 3 (three) times daily.    . fluticasone (FLONASE) 50 MCG/ACT nasal spray Place 1-2 sprays into both nostrils daily as needed for allergies or rhinitis.    . furosemide (LASIX) 40 MG tablet Take 40 mg by mouth 2 (two) times daily as needed for fluid.     Marland Kitchen levothyroxine (SYNTHROID, LEVOTHROID) 50 MCG tablet Take 50 mcg by mouth daily before breakfast.    . LORazepam (ATIVAN) 1 MG tablet Take 1.5 mg by mouth 3 (three) times daily.    . Misc Natural Products (TART CHERRY ADVANCED PO) Take 120 mLs by mouth daily. *Mixed with water* As needed for gout    . OXYGEN Inhale 2 L into the lungs at bedtime.    . potassium chloride SA (K-DUR,KLOR-CON) 20 MEQ tablet Take 20 mEq by mouth daily as needed (only takes when takes lasix).    . propranolol ER (INDERAL LA) 80 MG 24 hr capsule Take 80 mg by mouth daily.    Marland Kitchen ULORIC 40 MG tablet     . HYDROcodone-acetaminophen (NORCO/VICODIN) 5-325 MG tablet Take 1-2 tablets by mouth every 6 (six) hours as needed for moderate pain. (Patient not taking: Reported on 11/08/2016) 20 tablet 0  . hyoscyamine (LEVSIN SL) 0.125 MG SL tablet Place 1 tablet (0.125 mg total) under  the tongue every 4 (four) hours as needed. (Patient not taking: Reported on 11/08/2016) 30 tablet 0  . predniSONE (DELTASONE) 10 MG tablet Take 4 tablets (40 mg total) by mouth daily. (Patient not taking: Reported on 11/08/2016) 20 tablet 0   No current facility-administered medications for this visit.     Allergies as of 11/08/2016 - Review Complete 11/08/2016  Allergen Reaction Noted  . Demerol [meperidine] Rash 09/30/2012  . Atorvastatin Other (See Comments) 11/30/2006  . Zocor [simvastatin - high dose] Other (See Comments) 01/25/2012    Family History  Problem Relation Age of Onset  . Colon cancer Mother 35  . Diabetes Mother   . Asthma Mother   . Heart disease Mother   . Cerebral  aneurysm Father   . Asthma Maternal Aunt   . Asthma Brother   . Heart disease Maternal Grandfather   . Heart disease Maternal Grandmother     Social History   Social History  . Marital status: Married    Spouse name: N/A  . Number of children: N/A  . Years of education: N/A   Occupational History  . Homemaker    Social History Main Topics  . Smoking status: Former Smoker    Packs/day: 0.50    Years: 30.00    Quit date: 10/03/1989  . Smokeless tobacco: Never Used     Comment: Quit in 03-22-91  . Alcohol use No  . Drug use: No  . Sexual activity: No   Other Topics Concern  . None   Social History Narrative   Widowed. Husband passed away 03-21-16         Review of Systems: As mentioned in HPI   Physical Exam: BP (!) 94/59   Pulse 73   Temp 97.3 F (36.3 C) (Oral)   Ht 5\' 2"  (1.575 m)   Wt 271 lb 12.8 oz (123.3 kg)   BMI 49.71 kg/m  General:   Alert and oriented. No distress noted. Pleasant but tearful at times.  Head:  Normocephalic and atraumatic. Eyes:  Conjuctiva clear without scleral icterus. Mouth:  Oral mucosa pink and moist. Good dentition. No lesions. Abdomen:  +BS, soft, obese, mild TTP RUQ. No rebound or guarding. No HSM or masses noted. Msk:  Symmetrical without  gross deformities. Normal posture. Extremities:  Without edema. Neurologic:  Alert and  oriented x4 Psych:  Alert and cooperative. Normal mood and affect.   Lab Results  Component Value Date   ALT 15 09/18/2016   AST 14 09/18/2016   ALKPHOS 80 09/18/2016   BILITOT 0.6 09/18/2016   Lab Results  Component Value Date   LIPASE 14 09/18/2016   Lab Results  Component Value Date   WBC 6.1 04/11/2016   HGB 12.8 04/11/2016   HCT 40.5 04/11/2016   MCV 91.2 04/11/2016   PLT 248 04/11/2016

## 2016-11-08 NOTE — Patient Instructions (Signed)
We have ordered labs to check your liver numbers.   I am referring you to [facility] for further evaluation.   I will see you in March 2018!  Have a wonderful Christmas!

## 2016-11-09 NOTE — Progress Notes (Signed)
Liver numbers normal. Patient informed.

## 2016-11-11 NOTE — Assessment & Plan Note (Signed)
75 year old female with chronic RUQ pain, persistently normal LFTs, normal lipase, CT on file, MRI without concerning findings. Prior history of ERCP due to CBD stones, but imaging and labs continue to be normal. She has had a slow, steady, weight loss over the past year, but she also lost her husband earlier this year and remains tearful at appointments. I suspect grief is playing a large part in her weight loss. Her family member also notes that she has worsening episodes of pain when alone, and feels anxiety is part of issue. Patient reports clay-colored/white stool; thus far, labs and imaging to include CT and MRI have been unrevealing. As she continues to have pain and interesting clinical presentation, I have recommended referral to [facility] for tertiary consultation. She is agreeable to this. Continue Levsin prn for abdominal pain, as she notes this has helped in the past. Return in March 2018. Colonoscopy/EGD completed earlier this year.

## 2016-11-12 NOTE — Progress Notes (Signed)
CC'ED TO PCP 

## 2016-11-13 ENCOUNTER — Encounter (HOSPITAL_COMMUNITY): Payer: Self-pay | Admitting: Emergency Medicine

## 2016-11-13 ENCOUNTER — Emergency Department (HOSPITAL_COMMUNITY)
Admission: EM | Admit: 2016-11-13 | Discharge: 2016-11-14 | Disposition: A | Discharge status: Home / Self Care | Payer: Medicare Other | Attending: Emergency Medicine | Admitting: Emergency Medicine

## 2016-11-13 ENCOUNTER — Emergency Department (HOSPITAL_COMMUNITY): Payer: Medicare Other

## 2016-11-13 DIAGNOSIS — I5032 Chronic diastolic (congestive) heart failure: Secondary | ICD-10-CM | POA: Insufficient documentation

## 2016-11-13 DIAGNOSIS — R140 Abdominal distension (gaseous): Secondary | ICD-10-CM | POA: Diagnosis not present

## 2016-11-13 DIAGNOSIS — J45909 Unspecified asthma, uncomplicated: Secondary | ICD-10-CM | POA: Insufficient documentation

## 2016-11-13 DIAGNOSIS — Z87891 Personal history of nicotine dependence: Secondary | ICD-10-CM | POA: Diagnosis not present

## 2016-11-13 DIAGNOSIS — R079 Chest pain, unspecified: Secondary | ICD-10-CM | POA: Insufficient documentation

## 2016-11-13 DIAGNOSIS — E039 Hypothyroidism, unspecified: Secondary | ICD-10-CM | POA: Diagnosis not present

## 2016-11-13 DIAGNOSIS — Z79899 Other long term (current) drug therapy: Secondary | ICD-10-CM | POA: Insufficient documentation

## 2016-11-13 DIAGNOSIS — R002 Palpitations: Secondary | ICD-10-CM | POA: Insufficient documentation

## 2016-11-13 DIAGNOSIS — R0602 Shortness of breath: Secondary | ICD-10-CM | POA: Insufficient documentation

## 2016-11-13 DIAGNOSIS — J3489 Other specified disorders of nose and nasal sinuses: Secondary | ICD-10-CM | POA: Diagnosis not present

## 2016-11-13 NOTE — ED Provider Notes (Signed)
[facility] DEPT Provider Note   CSN: [id number] Arrival date & time: 11/13/16  2317 By signing my name below, I, Dyke Brackett, attest that this documentation has been prepared under the direction and in the presence of Rolland Porter, MD . Electronically Signed: Dyke Brackett, Scribe. 11/13/2016. 11:50 PM.   Time seen 23:31 PM   History   Chief Complaint Chief Complaint  Patient presents with  . Shortness of Breath  . Palpitations   HPI [name] is a 75 y.o. female with a hx of anxiety, asthma, A-fib and depression who presents to the Emergency Department complaining of palpitations onset a few days ago, but worse tonight. She states she feels like her heart is racing "like crazy", even as we are speaking (heart rate in the 70's). Pt states it feels as if "my whole body is quivering". She notes associated SOB which began three days ago, intermittent central pinching CP "occassionally", clear rhinorrhea without sneezing, and abdominal distention. Pt describes her chest pain as " a pinch in my right side." She also complains of "a loud noise in my head". She states it sounds as if "the wheels of a train are running through my head" and also notes an occasion thunderous or roaring sound which began three days ago. Per pt, she has a hx of tinnitus, but this seems different to her. Pt also states this does not feel like she is having anxiety. She wears 2 L O2 at night with her CPAP, but is not on any oxygen during the day. Pt is a nonsmoker and states she quit smoking 30 years ago; she denies any alcohol use. She is followed by Adrian Prows for cardiology; pt was followed at [facility] for ENT.  She denies any new cough, wheezing, nausea, vomiting, diarrhea, sore throat, sneezing, leg swelling, dizziness, lightheadedness, headache, or any other associated symptoms.  Pt states her BP is usually about 100/60. Her BP was 95/58 at the gastroenterology office last week.   The history is provided by  the patient. No language interpreter was used.   PCP Dr Morene Rankins Cardiology Dr Gwendel Hanson  Past Medical History:  Diagnosis Date  . Allergic rhinitis   . Anxiety   . Asthma    pt states she "believe it is more allergies"  . Cholecystitis   . Chronic abdominal pain   . Colon polyp    TCS 2005 TICS [place], TCS/PROPOFOL 2011 SIMPLE ADENOMAS  . Depression   . Diverticulosis 2005  . Fatty liver   . Fibromyalgia   . GERD (gastroesophageal reflux disease)   . Gout   . Hyperlipemia   . Hypothyroidism   . Internal hemorrhoids    H/o Rectal bleeding secondary to internal hemorrhoids 04/2010.  . Migraine   . Narcolepsy   . Obesity, morbid (more than 100 lbs over ideal weight or BMI > 40) (HCC)   . Obstructive sleep apnea on CPAP and O2  . Osteoarthritis   . Persistent atrial fibrillation ([place])    a. s/p DCCV 01/2012  . Volume overload    uses torsemide PRN    Patient Active Problem List   Diagnosis Date Noted  . History of colonic polyps   . Diverticulosis of colon without hemorrhage   . Hiatal hernia   . RUQ abdominal pain 02/27/2016  . Dyspnea 05/03/2014  . Acute respiratory failure ([place]) 01/13/2014  . CAP (community acquired pneumonia) 01/12/2014  . RUQ pain 02/05/2013  . Rib pain on right side 02/05/2013  .  Cholelithiasis 09/01/2012  . Atrial fibrillation ([place]) 09/01/2012  . Diastolic CHF ([place]) 0000000  . INTERNAL HEMORRHOIDS 11/06/2010  . FATIGUE 11/06/2010  . Abdominal pain 11/06/2010  . INTERTRIGO, CANDIDAL 06/03/2009  . VARICOSE VEINS LOWER EXTREMITIES W/INFLAMMATION 01/10/2009  . HYPOTHYROIDISM 11/30/2006  . HYPERLIPIDEMIA 11/30/2006  . OBESITY, MORBID 11/30/2006  . ANXIETY 11/30/2006  . DEPRESSION 11/30/2006  . OBSTRUCTIVE SLEEP APNEA 11/30/2006  . MIGRAINE HEADACHE 11/30/2006  . NARCOLEPSY W/CATAPLEXY 11/30/2006  . ALLERGIC RHINITIS 11/30/2006  . ASTHMA 11/30/2006  . GERD 11/30/2006  . OSTEOARTHRITIS 11/30/2006  . FIBROMYALGIA 11/30/2006  . PALPITATIONS  11/30/2006  . COLONIC POLYPS, HX OF 11/30/2006    Past Surgical History:  Procedure Laterality Date  . ABDOMINAL HYSTERECTOMY  FIBROIDS 1982  . BLADDER SUSPENSION  1982  . CARDIOVERSION  02/05/2012   Procedure: CARDIOVERSION;  Surgeon: Laverda Page, MD;  Location: [place];  Service: Cardiovascular;  Laterality: N/A;  . CARDIOVERSION N/A 06/09/2013   Procedure: CARDIOVERSION;  Surgeon: Laverda Page, MD;  Location: [facility] ENDOSCOPY;  Service: Cardiovascular;  Laterality: N/A;  h&p in file-[place]  . CARDIOVERSION N/A 08/09/2015   Procedure: CARDIOVERSION;  Surgeon: Adrian Prows, MD;  Location: [facility] ENDOSCOPY;  Service: Cardiovascular;  Laterality: N/A;  . CARDIOVERSION N/A 09/13/2015   Procedure: CARDIOVERSION;  Surgeon: Adrian Prows, MD;  Location: [facility] ENDOSCOPY;  Service: Cardiovascular;  Laterality: N/A;  . CHOLECYSTECTOMY  09/03/2012   Procedure: LAPAROSCOPIC CHOLECYSTECTOMY;  Surgeon: Jamesetta So, MD;  Location: [facility] ORS;  Service: General;  Laterality: N/A;  . COLONOSCOPY  2005   Dr. Tamala Julian: sigmoid diverticulosis  . COLONOSCOPY  June 2011   Dr. Oneida Alar: internal hemorrhoids, simple adenomas, hyperplastic polyps, needs surveillance in June 2014 with overtube and Propofol  . COLONOSCOPY WITH PROPOFOL N/A 03/19/2016   Dr. Gala Romney: multiple small tubular adenomas, diverticulosis. Surveillance in 5 years   . ERCP  10/01/2012   Procedure: ENDOSCOPIC RETROGRADE CHOLANGIOPANCREATOGRAPHY (ERCP);  Surgeon: Daneil Dolin, MD;  Location: [facility] ORS;  Service: Endoscopy;  Laterality: N/A;  . ESOPHAGOGASTRODUODENOSCOPY (EGD) WITH PROPOFOL N/A 03/19/2016   DR. Rourk: normal esohpagus with small hiatal hernia   . Kidney Stent  1995  . POLYPECTOMY  03/19/2016   Procedure: POLYPECTOMY;  Surgeon: Daneil Dolin, MD;  Location: [facility] END[facility];  Service: Endoscopy;;  cold snares  . REMOVAL OF STONES  10/01/2012   Procedure: REMOVAL OF STONES;  Surgeon: Daneil Dolin, MD;  Location: [facility] ORS;  Service: Endoscopy;  Laterality:  N/A;  . RHINOPLASTY    . SPHINCTEROTOMY  10/01/2012   Procedure: SPHINCTEROTOMY;  Surgeon: Daneil Dolin, MD;  Location: [facility] ORS;  Service: Endoscopy;  Laterality: N/A;    OB History    No data available       Home Medications    Prior to Admission medications   Medication Sig Start Date End Date Taking? Authorizing Provider  albuterol (PROVENTIL,VENTOLIN) 90 MCG/ACT inhaler Inhale 2 puffs into the lungs every 6 (six) hours as needed for shortness of breath.    Yes Historical Provider, MD  allopurinol (ZYLOPRIM) 100 MG tablet Take 100 mg by mouth daily.   Yes Historical Provider, MD  apixaban (ELIQUIS) 5 MG TABS tablet Take 5 mg by mouth 2 (two) times daily.   Yes Historical Provider, MD  esomeprazole (NEXIUM) 40 MG capsule Take 40 mg by mouth daily at 12 noon. Takes every  3-4 days   Yes Historical Provider, MD  FLUoxetine (PROZAC) 20 MG capsule Take 20 mg by  mouth 3 (three) times daily.   Yes Historical Provider, MD  fluticasone (FLONASE) 50 MCG/ACT nasal spray Place 1-2 sprays into both nostrils daily as needed for allergies or rhinitis.   Yes Historical Provider, MD  furosemide (LASIX) 40 MG tablet Take 40 mg by mouth 2 (two) times daily as needed for fluid.  12/01/15  Yes Historical Provider, MD  hyoscyamine (LEVSIN SL) 0.125 MG SL tablet Place 1 tablet (0.125 mg total) under the tongue every 4 (four) hours as needed. 09/21/16  Yes Annitta Needs, NP  levothyroxine (SYNTHROID, LEVOTHROID) 50 MCG tablet Take 50 mcg by mouth daily before breakfast.   Yes Historical Provider, MD  LORazepam (ATIVAN) 1 MG tablet Take 1.5 mg by mouth 3 (three) times daily.   Yes Historical Provider, MD  Misc Natural Products (TART CHERRY ADVANCED PO) Take 120 mLs by mouth daily. *Mixed with water* As needed for gout   Yes Historical Provider, MD  OXYGEN Inhale 2 L into the lungs at bedtime.   Yes Historical Provider, MD  potassium chloride SA (K-DUR,KLOR-CON) 20 MEQ tablet Take 20 mEq by mouth daily as needed  (only takes when takes lasix).   Yes Historical Provider, MD  propranolol ER (INDERAL LA) 80 MG 24 hr capsule Take 80 mg by mouth daily.   Yes Historical Provider, MD  ULORIC 40 MG tablet  10/26/16  Yes Historical Provider, MD    Family History Family History  Problem Relation Age of Onset  . Colon cancer Mother 54  . Diabetes Mother   . Asthma Mother   . Heart disease Mother   . Cerebral aneurysm Father   . Asthma Maternal Aunt   . Asthma Brother   . Heart disease Maternal Grandfather   . Heart disease Maternal Grandmother     Social History Social History  Substance Use Topics  . Smoking status: Former Smoker    Packs/day: 0.50    Years: 30.00    Quit date: 10/03/1989  . Smokeless tobacco: Never Used     Comment: Quit in 1992  . Alcohol use No  lives at home Lives alone   Allergies   Demerol [meperidine]; Atorvastatin; and Zocor [simvastatin - high dose]   Review of Systems Review of Systems  HENT: Positive for rhinorrhea. Negative for sore throat.   Respiratory: Positive for shortness of breath. Negative for cough.   Cardiovascular: Positive for chest pain and palpitations. Negative for leg swelling.  Gastrointestinal: Positive for abdominal distention. Negative for diarrhea, nausea and vomiting.  Neurological: Negative for headaches.  All other systems reviewed and are negative.  Physical Exam Updated Vital Signs BP (!) 95/48 (BP Location: Left Arm)   Pulse 74   Temp 97.7 F (36.5 C) (Oral)   Resp 22   SpO2 97%   Vital signs normal low blood pressure   Physical Exam  Constitutional: She is oriented to person, place, and time. She appears well-developed and well-nourished.  Non-toxic appearance. She does not appear ill. No distress.  HENT:  Head: Normocephalic and atraumatic.  Right Ear: External ear normal.  Left Ear: External ear normal.  Nose: Nose normal. No mucosal edema or rhinorrhea.  Mouth/Throat: Oropharynx is clear and moist and mucous  membranes are normal. No dental abscesses or uvula swelling.  Eyes: Conjunctivae and EOM are normal. Pupils are equal, round, and reactive to light.  Neck: Normal range of motion and full passive range of motion without pain. Neck supple.  No bruits.   Cardiovascular: Normal rate  and normal heart sounds.  An irregular rhythm present. Exam reveals no gallop and no friction rub.   No murmur heard. Pulmonary/Chest: Effort normal and breath sounds normal. No respiratory distress. She has no wheezes. She has no rhonchi. She has no rales. She exhibits no tenderness and no crepitus.  Abdominal: Soft. Normal appearance and bowel sounds are normal. She exhibits no distension. There is no tenderness. There is no rebound and no guarding.  Musculoskeletal: Normal range of motion. She exhibits no edema or tenderness.  Neurological: She is alert and oriented to person, place, and time. She has normal strength. No cranial nerve deficit.  Skin: Skin is warm, dry and intact. No rash noted. No erythema. No pallor.  Psychiatric: She has a normal mood and affect. Her speech is normal and behavior is normal. Her mood appears not anxious.  Nursing note and vitals reviewed.    ED Treatments / Results  DIAGNOSTIC STUDIES:  Oxygen Saturation is 97% on RA, normal by my interpretation.   Labs (all labs ordered are listed, but only abnormal results are displayed) Results for orders placed or performed during the hospital encounter of 11/13/16  Comprehensive metabolic panel  Result Value Ref Range   Sodium 140 135 - 145 mmol/L   Potassium 3.8 3.5 - 5.1 mmol/L   Chloride 106 101 - 111 mmol/L   CO2 27 22 - 32 mmol/L   Glucose, Bld 109 (H) 65 - 99 mg/dL   BUN 17 6 - 20 mg/dL   Creatinine, Ser 0.78 0.44 - 1.00 mg/dL   Calcium 9.2 8.9 - 10.3 mg/dL   Total Protein 6.6 6.5 - 8.1 g/dL   Albumin 3.5 3.5 - 5.0 g/dL   AST 21 15 - 41 U/L   ALT 21 14 - 54 U/L   Alkaline Phosphatase 74 38 - 126 U/L   Total Bilirubin 0.6  0.3 - 1.2 mg/dL   GFR calc non Af Amer >60 >60 mL/min   GFR calc Af Amer >60 >60 mL/min   Anion gap 7 5 - 15  CBC with Differential  Result Value Ref Range   WBC 8.0 4.0 - 10.5 K/uL   RBC 4.59 3.87 - 5.11 MIL/uL   Hemoglobin 13.6 12.0 - 15.0 g/dL   HCT 42.2 36.0 - 46.0 %   MCV 91.9 78.0 - 100.0 fL   MCH 29.6 26.0 - 34.0 pg   MCHC 32.2 30.0 - 36.0 g/dL   RDW 13.2 11.5 - 15.5 %   Platelets 226 150 - 400 K/uL   Neutrophils Relative % 64 %   Neutro Abs 5.2 1.7 - 7.7 K/uL   Lymphocytes Relative 22 %   Lymphs Abs 1.8 0.7 - 4.0 K/uL   Monocytes Relative 8 %   Monocytes Absolute 0.6 0.1 - 1.0 K/uL   Eosinophils Relative 6 %   Eosinophils Absolute 0.5 0.0 - 0.7 K/uL   Basophils Relative 0 %   Basophils Absolute 0.0 0.0 - 0.1 K/uL  Urinalysis, Routine w reflex microscopic  Result Value Ref Range   Color, Urine STRAW (A) YELLOW   APPearance CLEAR CLEAR   Specific Gravity, Urine 1.004 (L) 1.005 - 1.030   pH 6.0 5.0 - 8.0   Glucose, UA NEGATIVE NEGATIVE mg/dL   Hgb urine dipstick NEGATIVE NEGATIVE   Bilirubin Urine NEGATIVE NEGATIVE   Ketones, ur NEGATIVE NEGATIVE mg/dL   Protein, ur NEGATIVE NEGATIVE mg/dL   Nitrite NEGATIVE NEGATIVE   Leukocytes, UA NEGATIVE NEGATIVE  Brain natriuretic peptide  Result Value Ref Range   B Natriuretic Peptide 674.0 (H) 0.0 - 100.0 pg/mL  Troponin I  Result Value Ref Range   Troponin I <0.03 <0.03 ng/mL   Laboratory interpretation all normal except mildly elevated BNP    EKG  EKG Interpretation  Date/Time:  Tuesday November 13 2016 23:26:49 EST Ventricular Rate:  88 PR Interval:    QRS Duration: 82 QT Interval:  359 QTC Calculation: 435 R Axis:     Text Interpretation:  Atrial fibrillation Low voltage, precordial leads Probable anteroseptal infarct, old Baseline wander in lead(s) III Since last tracing rate faster 13 Sep 2015 Confirmed by [facility]  MD-I, Heman Que ([id number]) on 11/14/2016 12:25:13 AM       Radiology Dg Chest 2 View  Result  Date: 11/14/2016 CLINICAL DATA:  Palpitations, onset a few days ago but worse tonight. EXAM: CHEST  2 VIEW COMPARISON:  12/12/2014 FINDINGS: Unchanged mild hyperinflation. The lungs are clear. There is no pleural effusion. The pulmonary vasculature is normal. Hilar, mediastinal and cardiac contours are unremarkable and unchanged. IMPRESSION: Hyperinflation.  No acute findings. Electronically Signed   By: Andreas Newport M.D.   On: 11/14/2016 00:19    Procedures Procedures (including critical care time)  Medications Ordered in ED Medications - No data to display   Initial Impression / Assessment and Plan / ED Course  I have reviewed the triage vital signs and the nursing notes.  Pertinent labs & imaging results that were available during my care of the patient were reviewed by me and considered in my medical decision making (see chart for details).  Clinical Course     COORDINATION OF CARE:  11:44 PM Will order troponin, CBC, brain natriuretic peptide, CMP, and UA. Discussed treatment plan with pt at bedside and pt agreed to plan.   12:50 AM discussed her test results. Pt states she used to see Dr Jefm Miles when she was having family problems in the past and it really helped. She does not want to talk to mental health counselor tonight.  She states her husband died in March 24, 2023 and she is having trouble adjusting to living alone. She is not SI.  Her daughter is with her in the room. She has not run out of her lorazepam.  Her BP improved to 109/66 without treatment.   Pt also states Dr Einar Gip has stopped going cardioversion for her atrial fib, the last time was in Oct 2016.  It seems to me that tonight a lot of her symptoms are related to anxiety. We discussed that anxiety can make physical problems feel worse.   Pt has lasix that she takes PRN. We discussed taking one when she gets up this morning.   Pt was ambulated by nursing staff and her pulse ox remained 96% on RA and she denies SOB,  but told her daughter it made her feel shaky all over.   Final Clinical Impressions(s) / ED Diagnoses   Final diagnoses:  Palpitations    Plan discharge  Rolland Porter, MD, FACEP   I personally performed the services described in this documentation, which was scribed in my presence. The recorded information has been reviewed and considered.  Rolland Porter, MD, Barbette Or, MD 11/14/16 [phone]

## 2016-11-13 NOTE — ED Triage Notes (Signed)
Pt states she has A-fib and that her heart feels like it is racing. Pt also c/o shortness of breath and a headache.

## 2016-11-13 NOTE — ED Triage Notes (Signed)
Pt not c/o headache. States she feels like a train is running through it. Says it is very loud noise.

## 2016-11-14 DIAGNOSIS — Z6841 Body Mass Index (BMI) 40.0 and over, adult: Secondary | ICD-10-CM | POA: Diagnosis not present

## 2016-11-14 DIAGNOSIS — I480 Paroxysmal atrial fibrillation: Secondary | ICD-10-CM | POA: Diagnosis not present

## 2016-11-14 DIAGNOSIS — M109 Gout, unspecified: Secondary | ICD-10-CM | POA: Diagnosis not present

## 2016-11-14 DIAGNOSIS — R002 Palpitations: Secondary | ICD-10-CM | POA: Diagnosis not present

## 2016-11-14 DIAGNOSIS — E039 Hypothyroidism, unspecified: Secondary | ICD-10-CM | POA: Diagnosis not present

## 2016-11-14 DIAGNOSIS — F330 Major depressive disorder, recurrent, mild: Secondary | ICD-10-CM | POA: Diagnosis not present

## 2016-11-14 LAB — CBC WITH DIFFERENTIAL/PLATELET
BASOS PCT: 0 %
Basophils Absolute: 0 10*3/uL (ref 0.0–0.1)
EOS ABS: 0.5 10*3/uL (ref 0.0–0.7)
Eosinophils Relative: 6 %
HCT: 42.2 % (ref 36.0–46.0)
Hemoglobin: 13.6 g/dL (ref 12.0–15.0)
LYMPHS ABS: 1.8 10*3/uL (ref 0.7–4.0)
Lymphocytes Relative: 22 %
MCH: 29.6 pg (ref 26.0–34.0)
MCHC: 32.2 g/dL (ref 30.0–36.0)
MCV: 91.9 fL (ref 78.0–100.0)
MONO ABS: 0.6 10*3/uL (ref 0.1–1.0)
MONOS PCT: 8 %
Neutro Abs: 5.2 10*3/uL (ref 1.7–7.7)
Neutrophils Relative %: 64 %
Platelets: 226 10*3/uL (ref 150–400)
RBC: 4.59 MIL/uL (ref 3.87–5.11)
RDW: 13.2 % (ref 11.5–15.5)
WBC: 8 10*3/uL (ref 4.0–10.5)

## 2016-11-14 LAB — URINALYSIS, ROUTINE W REFLEX MICROSCOPIC
BILIRUBIN URINE: NEGATIVE
Glucose, UA: NEGATIVE mg/dL
HGB URINE DIPSTICK: NEGATIVE
KETONES UR: NEGATIVE mg/dL
Leukocytes, UA: NEGATIVE
NITRITE: NEGATIVE
PH: 6 (ref 5.0–8.0)
Protein, ur: NEGATIVE mg/dL
Specific Gravity, Urine: 1.004 — ABNORMAL LOW (ref 1.005–1.030)

## 2016-11-14 LAB — COMPREHENSIVE METABOLIC PANEL
ALBUMIN: 3.5 g/dL (ref 3.5–5.0)
ALK PHOS: 74 U/L (ref 38–126)
ALT: 21 U/L (ref 14–54)
AST: 21 U/L (ref 15–41)
Anion gap: 7 (ref 5–15)
BUN: 17 mg/dL (ref 6–20)
CALCIUM: 9.2 mg/dL (ref 8.9–10.3)
CO2: 27 mmol/L (ref 22–32)
CREATININE: 0.78 mg/dL (ref 0.44–1.00)
Chloride: 106 mmol/L (ref 101–111)
GFR calc non Af Amer: >60 mL/min (ref >60)
GLUCOSE: 109 mg/dL — AB (ref 65–99)
Potassium: 3.8 mmol/L (ref 3.5–5.1)
SODIUM: 140 mmol/L (ref 135–145)
Total Bilirubin: 0.6 mg/dL (ref 0.3–1.2)
Total Protein: 6.6 g/dL (ref 6.5–8.1)

## 2016-11-14 LAB — TROPONIN I

## 2016-11-14 LAB — BRAIN NATRIURETIC PEPTIDE: B NATRIURETIC PEPTIDE 5: 674 pg/mL — AB (ref 0.0–100.0)

## 2016-11-14 NOTE — ED Notes (Signed)
Pt wheeled to waiting room. Pt verbalized understanding of discharge instructions.   

## 2016-11-14 NOTE — ED Notes (Signed)
Pt ambulated around nurses station. Denied SOB. O2 stayed above 96% the entire walk.

## 2016-11-14 NOTE — Discharge Instructions (Signed)
Take your fluid pill lasix in the morning. Call Dr Ferne Coe office tomorrow to get an appointment to see him again. Return to the ED if you feel like you might do something to harm yourself or someone else. Your heart rate has been in the 70's while you were in the ED which is a normal heart rate, even though you are in the atrial fibrillation.

## 2016-12-06 ENCOUNTER — Telehealth (HOSPITAL_COMMUNITY): Payer: Self-pay | Admitting: *Deleted

## 2016-12-06 NOTE — Telephone Encounter (Signed)
LEFT VOICE MESSAGE REGARDING AN APPOINTMENT. 

## 2016-12-24 ENCOUNTER — Telehealth (HOSPITAL_COMMUNITY): Payer: Self-pay | Admitting: *Deleted

## 2016-12-24 NOTE — Telephone Encounter (Signed)
spoke with patient, she asked is Dr. Sima Matas still here.   She did not schedule at this time.   Said she will call back after she speaks with her referring doctor.

## 2016-12-31 DIAGNOSIS — R1011 Right upper quadrant pain: Secondary | ICD-10-CM | POA: Diagnosis not present

## 2016-12-31 DIAGNOSIS — K760 Fatty (change of) liver, not elsewhere classified: Secondary | ICD-10-CM | POA: Diagnosis not present

## 2016-12-31 DIAGNOSIS — G8929 Other chronic pain: Secondary | ICD-10-CM | POA: Insufficient documentation

## 2016-12-31 DIAGNOSIS — Z9049 Acquired absence of other specified parts of digestive tract: Secondary | ICD-10-CM | POA: Diagnosis not present

## 2017-01-07 DIAGNOSIS — I480 Paroxysmal atrial fibrillation: Secondary | ICD-10-CM | POA: Diagnosis not present

## 2017-01-07 DIAGNOSIS — F330 Major depressive disorder, recurrent, mild: Secondary | ICD-10-CM | POA: Diagnosis not present

## 2017-01-07 DIAGNOSIS — E559 Vitamin D deficiency, unspecified: Secondary | ICD-10-CM | POA: Diagnosis not present

## 2017-01-07 DIAGNOSIS — M109 Gout, unspecified: Secondary | ICD-10-CM | POA: Diagnosis not present

## 2017-01-07 DIAGNOSIS — E039 Hypothyroidism, unspecified: Secondary | ICD-10-CM | POA: Diagnosis not present

## 2017-01-07 DIAGNOSIS — Z6841 Body Mass Index (BMI) 40.0 and over, adult: Secondary | ICD-10-CM | POA: Diagnosis not present

## 2017-01-20 DIAGNOSIS — Z23 Encounter for immunization: Secondary | ICD-10-CM | POA: Diagnosis not present

## 2017-02-06 ENCOUNTER — Ambulatory Visit: Payer: Medicare Other | Admitting: Urology

## 2017-02-06 ENCOUNTER — Ambulatory Visit: Payer: Medicare Other | Admitting: Gastroenterology

## 2017-02-07 ENCOUNTER — Encounter: Payer: Self-pay | Admitting: Gastroenterology

## 2017-02-07 ENCOUNTER — Ambulatory Visit (INDEPENDENT_AMBULATORY_CARE_PROVIDER_SITE_OTHER): Payer: Medicare Other | Admitting: Gastroenterology

## 2017-02-07 VITALS — BP 113/55 | HR 86 | Temp 97.8°F | Ht 62.0 in | Wt 269.8 lb

## 2017-02-07 DIAGNOSIS — R1011 Right upper quadrant pain: Secondary | ICD-10-CM

## 2017-02-07 MED ORDER — HYOSCYAMINE SULFATE 0.125 MG SL SUBL: 0.1250 mg | SUBLINGUAL_TABLET | SUBLINGUAL | 3 refills | Status: DC | PRN

## 2017-02-07 NOTE — Progress Notes (Signed)
Referring Provider: Denny Levy, PA Primary Care Physician:  Denny Levy, [place] Primary GI: [facility]   Chief Complaint  Patient presents with  . Abdominal Pain    RUQ, across top    HPI:   [name] is a 76 y.o. female presenting today with a history of chronic RUQ pain, thoroughly evaluated here and seen by Dr. Roney Mans at [facility], who felt distension of hepatic capsule associated with hepatic steatosis was the culprit, recommending low-fat diet, weight reduction, and trial of Vit E 800 units daily. States when she was there at [facility] she felt better, and now her pain is acting up again. Pain is radiating through to back. Pain goes across upper abdomen and feels like it radiates to her back. Trying to avoid fatty foods. She has dealt with a lot of grief after her husband passing away last year. Levsin seems to help with abdominal pain at times. No rectal bleeding. No N/V. Overall seems to be in better spirits then I have seen her in the past.   Past Medical History:  Diagnosis Date  . Allergic rhinitis   . Anxiety   . Asthma    pt states she "believe it is more allergies"  . Cholecystitis   . Chronic abdominal pain   . Colon polyp    TCS 2005 TICS [place], TCS/PROPOFOL 2011 SIMPLE ADENOMAS  . Depression   . Diverticulosis 2005  . Fatty liver   . Fibromyalgia   . GERD (gastroesophageal reflux disease)   . Gout   . Hyperlipemia   . Hypothyroidism   . Internal hemorrhoids    H/o Rectal bleeding secondary to internal hemorrhoids 04/2010.  . Migraine   . Narcolepsy   . Obesity, morbid (more than 100 lbs over ideal weight or BMI > 40) (HCC)   . Obstructive sleep apnea on CPAP and O2  . Osteoarthritis   . Persistent atrial fibrillation ([place])    a. s/p DCCV 01/2012  . Volume overload    uses torsemide PRN    Past Surgical History:  Procedure Laterality Date  . ABDOMINAL HYSTERECTOMY  FIBROIDS 1982  . BLADDER SUSPENSION  1982  . CARDIOVERSION  02/05/2012   Procedure:  CARDIOVERSION;  Surgeon: Laverda Page, MD;  Location: [place];  Service: Cardiovascular;  Laterality: N/A;  . CARDIOVERSION N/A 06/09/2013   Procedure: CARDIOVERSION;  Surgeon: Laverda Page, MD;  Location: [facility] ENDOSCOPY;  Service: Cardiovascular;  Laterality: N/A;  h&p in file-[place]  . CARDIOVERSION N/A 08/09/2015   Procedure: CARDIOVERSION;  Surgeon: Adrian Prows, MD;  Location: [facility] ENDOSCOPY;  Service: Cardiovascular;  Laterality: N/A;  . CARDIOVERSION N/A 09/13/2015   Procedure: CARDIOVERSION;  Surgeon: Adrian Prows, MD;  Location: [facility] ENDOSCOPY;  Service: Cardiovascular;  Laterality: N/A;  . CHOLECYSTECTOMY  09/03/2012   Procedure: LAPAROSCOPIC CHOLECYSTECTOMY;  Surgeon: Jamesetta So, MD;  Location: [facility];  Service: General;  Laterality: N/A;  . COLONOSCOPY  2005   Dr. Tamala Julian: sigmoid diverticulosis  . COLONOSCOPY  June 2011   Dr. Oneida Alar: internal hemorrhoids, simple adenomas, hyperplastic polyps, needs surveillance in June 2014 with overtube and Propofol  . COLONOSCOPY WITH PROPOFOL N/A 03/19/2016   Dr. Gala Romney: multiple small tubular adenomas, diverticulosis. Surveillance in 5 years   . ERCP  10/01/2012   Procedure: ENDOSCOPIC RETROGRADE CHOLANGIOPANCREATOGRAPHY (ERCP);  Surgeon: Daneil Dolin, MD;  Location: [facility];  Service: Endoscopy;  Laterality: N/A;  . ESOPHAGOGASTRODUODENOSCOPY (EGD) WITH PROPOFOL N/A 03/19/2016   DR. Rourk: normal esohpagus with  small hiatal hernia   . Kidney Stent  1995  . POLYPECTOMY  03/19/2016   Procedure: POLYPECTOMY;  Surgeon: Daneil Dolin, MD;  Location: [facility];  Service: Endoscopy;;  cold snares  . REMOVAL OF STONES  10/01/2012   Procedure: REMOVAL OF STONES;  Surgeon: Daneil Dolin, MD;  Location: [facility];  Service: Endoscopy;  Laterality: N/A;  . RHINOPLASTY    . SPHINCTEROTOMY  10/01/2012   Procedure: SPHINCTEROTOMY;  Surgeon: Daneil Dolin, MD;  Location: [facility];  Service: Endoscopy;  Laterality: N/A;    Current Outpatient Prescriptions    Medication Sig Dispense Refill  . albuterol (PROVENTIL,VENTOLIN) 90 MCG/ACT inhaler Inhale 2 puffs into the lungs every 6 (six) hours as needed for shortness of breath.     . allopurinol (ZYLOPRIM) 100 MG tablet Take 100 mg by mouth daily.    Marland Kitchen apixaban (ELIQUIS) 5 MG TABS tablet Take 5 mg by mouth 2 (two) times daily.    Marland Kitchen esomeprazole (NEXIUM) 40 MG capsule Take 40 mg by mouth daily at 12 noon. Takes every  3-4 days    . fluticasone (FLONASE) 50 MCG/ACT nasal spray Place 1-2 sprays into both nostrils daily as needed for allergies or rhinitis.    . furosemide (LASIX) 40 MG tablet Take 40 mg by mouth 2 (two) times daily as needed for fluid.     . hyoscyamine (LEVSIN SL) 0.125 MG SL tablet Place 1 tablet (0.125 mg total) under the tongue every 4 (four) hours as needed. 30 tablet 0  . levothyroxine (SYNTHROID, LEVOTHROID) 50 MCG tablet Take 50 mcg by mouth daily before breakfast.    . LORazepam (ATIVAN) 1 MG tablet Take 0.5 mg by mouth 3 (three) times daily.     . Misc Natural Products (TART CHERRY ADVANCED PO) Take 120 mLs by mouth daily. *Mixed with water* As needed for gout    . OXYGEN Inhale 2 L into the lungs at bedtime.    . potassium chloride SA (K-DUR,KLOR-CON) 20 MEQ tablet Take 20 mEq by mouth daily as needed (only takes when takes lasix).    . propranolol ER (INDERAL LA) 80 MG 24 hr capsule Take 80 mg by mouth daily.    Marland Kitchen FLUoxetine (PROZAC) 20 MG capsule Take 20 mg by mouth 3 (three) times daily.    Marland Kitchen ULORIC 40 MG tablet      No current facility-administered medications for this visit.     Allergies as of 02/07/2017 - Review Complete 02/07/2017  Allergen Reaction Noted  . Demerol [meperidine] Rash 09/30/2012  . Atorvastatin Other (See Comments) 11/30/2006  . Zocor [simvastatin - high dose] Other (See Comments) 01/25/2012    Family History  Problem Relation Age of Onset  . Colon cancer Mother 73  . Diabetes Mother   . Asthma Mother   . Heart disease Mother   . Cerebral  aneurysm Father   . Asthma Maternal Aunt   . Asthma Brother   . Heart disease Maternal Grandfather   . Heart disease Maternal Grandmother     Social History   Social History  . Marital status: Married    Spouse name: N/A  . Number of children: N/A  . Years of education: N/A   Occupational History  . Homemaker    Social History Main Topics  . Smoking status: Former Smoker    Packs/day: 0.50    Years: 30.00    Quit date: 10/03/1989  . Smokeless tobacco: Never Used  Comment: Quit in Mar 15, 1991  . Alcohol use No  . Drug use: No  . Sexual activity: No   Other Topics Concern  . None   Social History Narrative   Widowed. Husband passed away 2016/03/14         Review of Systems: As mentioned in HPI   Physical Exam: BP (!) 113/55   Pulse 86   Temp 97.8 F (36.6 C) (Oral)   Ht 5\' 2"  (1.575 m)   Wt 269 lb 12.8 oz (122.4 kg)   BMI 49.35 kg/m  General:   Alert and oriented. No distress noted. Pleasant and cooperative.  Head:  Normocephalic and atraumatic. Eyes:  Conjuctiva clear without scleral icterus. Mouth:  Oral mucosa pink and moist. Good dentition. No lesions. Abdomen:  +BS, soft, mild TTP RUQ and non-distended. Obese.  No rebound or guarding. Unable to appreciate HSM due to larger AP diameter.  Msk:  Symmetrical without gross deformities. Normal posture. Extremities:  Without edema. Neurologic:  Alert and  oriented x4 Psych:  Alert and cooperative. Normal mood and affect.

## 2017-02-07 NOTE — Patient Instructions (Signed)
I will see you in 6 months!  Keep appointment with [facility] in the future.  It was so good to see you!

## 2017-02-11 NOTE — Assessment & Plan Note (Signed)
Thoroughly evaluated by our practice and actually referred to [facility] for tertiary assessment, with likely RUQ pain secondary to fatty liver/distension of hepatic capsule as per Dr. Roney Mans. Started on Vit E 400 units daily. She seems to be in better spirits than I have seen her in the past. Levsin has helped with abdominal discomfort, so I have refilled this. Return in 6 months for routine follow-up.

## 2017-02-11 NOTE — Progress Notes (Signed)
cc'ed to pcp °

## 2017-02-14 ENCOUNTER — Emergency Department (HOSPITAL_COMMUNITY): Payer: Medicare Other

## 2017-02-14 ENCOUNTER — Telehealth: Payer: Self-pay | Admitting: General Practice

## 2017-02-14 ENCOUNTER — Encounter (HOSPITAL_COMMUNITY): Payer: Self-pay | Admitting: Emergency Medicine

## 2017-02-14 ENCOUNTER — Emergency Department (HOSPITAL_COMMUNITY)
Admission: EM | Admit: 2017-02-14 | Discharge: 2017-02-14 | Disposition: A | Discharge status: Home / Self Care | Payer: Medicare Other | Attending: Emergency Medicine | Admitting: Emergency Medicine

## 2017-02-14 DIAGNOSIS — I5030 Unspecified diastolic (congestive) heart failure: Secondary | ICD-10-CM | POA: Insufficient documentation

## 2017-02-14 DIAGNOSIS — Z87891 Personal history of nicotine dependence: Secondary | ICD-10-CM | POA: Diagnosis not present

## 2017-02-14 DIAGNOSIS — R197 Diarrhea, unspecified: Secondary | ICD-10-CM | POA: Diagnosis not present

## 2017-02-14 DIAGNOSIS — R1084 Generalized abdominal pain: Secondary | ICD-10-CM | POA: Insufficient documentation

## 2017-02-14 DIAGNOSIS — E039 Hypothyroidism, unspecified: Secondary | ICD-10-CM | POA: Diagnosis not present

## 2017-02-14 DIAGNOSIS — R079 Chest pain, unspecified: Secondary | ICD-10-CM | POA: Diagnosis not present

## 2017-02-14 DIAGNOSIS — Z79899 Other long term (current) drug therapy: Secondary | ICD-10-CM | POA: Insufficient documentation

## 2017-02-14 DIAGNOSIS — J45909 Unspecified asthma, uncomplicated: Secondary | ICD-10-CM | POA: Diagnosis not present

## 2017-02-14 DIAGNOSIS — R1011 Right upper quadrant pain: Secondary | ICD-10-CM | POA: Diagnosis not present

## 2017-02-14 DIAGNOSIS — R1013 Epigastric pain: Secondary | ICD-10-CM | POA: Diagnosis not present

## 2017-02-14 DIAGNOSIS — R0602 Shortness of breath: Secondary | ICD-10-CM | POA: Insufficient documentation

## 2017-02-14 LAB — TROPONIN I

## 2017-02-14 LAB — URINALYSIS, COMPLETE (UACMP) WITH MICROSCOPIC
BILIRUBIN URINE: NEGATIVE
Glucose, UA: NEGATIVE mg/dL
HGB URINE DIPSTICK: NEGATIVE
Ketones, ur: NEGATIVE mg/dL
LEUKOCYTES UA: NEGATIVE
Nitrite: NEGATIVE
PROTEIN: NEGATIVE mg/dL
Specific Gravity, Urine: 1.008 (ref 1.005–1.030)
pH: 5 (ref 5.0–8.0)

## 2017-02-14 LAB — COMPREHENSIVE METABOLIC PANEL
ALBUMIN: 3.7 g/dL (ref 3.5–5.0)
ALK PHOS: 81 U/L (ref 38–126)
ALT: 20 U/L (ref 14–54)
ANION GAP: 8 (ref 5–15)
AST: 20 U/L (ref 15–41)
BUN: 14 mg/dL (ref 6–20)
CHLORIDE: 105 mmol/L (ref 101–111)
CO2: 26 mmol/L (ref 22–32)
CREATININE: 0.61 mg/dL (ref 0.44–1.00)
Calcium: 8.9 mg/dL (ref 8.9–10.3)
GFR calc non Af Amer: >60 mL/min (ref >60)
GLUCOSE: 96 mg/dL (ref 65–99)
Potassium: 3.7 mmol/L (ref 3.5–5.1)
SODIUM: 139 mmol/L (ref 135–145)
Total Bilirubin: 0.5 mg/dL (ref 0.3–1.2)
Total Protein: 6.9 g/dL (ref 6.5–8.1)

## 2017-02-14 LAB — CBC
HCT: 42.1 % (ref 36.0–46.0)
HEMOGLOBIN: 13.8 g/dL (ref 12.0–15.0)
MCH: 29.7 pg (ref 26.0–34.0)
MCHC: 32.8 g/dL (ref 30.0–36.0)
MCV: 90.5 fL (ref 78.0–100.0)
PLATELETS: 249 10*3/uL (ref 150–400)
RBC: 4.65 MIL/uL (ref 3.87–5.11)
RDW: 13.1 % (ref 11.5–15.5)
WBC: 8.2 10*3/uL (ref 4.0–10.5)

## 2017-02-14 LAB — LIPASE, BLOOD: Lipase: 33 U/L (ref 11–51)

## 2017-02-14 MED ORDER — IOPAMIDOL (ISOVUE-300) INJECTION 61%
INTRAVENOUS | Status: AC
  Administered 2017-02-14: 30 mL
  Filled 2017-02-14: qty 30

## 2017-02-14 MED ORDER — IOPAMIDOL (ISOVUE-300) INJECTION 61%
100.0000 mL | Freq: Once | INTRAVENOUS | Status: AC | PRN
  Administered 2017-02-14: 100 mL via INTRAVENOUS

## 2017-02-14 NOTE — ED Triage Notes (Signed)
Patient c/o swelling and pain to right upper abd with bloating and distension. Patient states was nauseated yesterday and then had a watery diarrhea for approx 45 minutes in which nausea has went away. Patient also states she is having chest pain that radiates into back and shoulder blades bilaterally but is more predominantly on right side. Per patient intermittent shortness of breath.

## 2017-02-14 NOTE — Telephone Encounter (Signed)
Patient called in stating she is having really bad RUQ ABD pain and the pain is radiating to her shoulder.  Patient wanted an appt tomorrow for the symptoms listed below, however we did not have any available appointments.    She has some nausea with diarrhea, sweats and dizzy spells and some chest pain "patient said she has been trying to ignore these symptoms"  I told her she should not ignore those symptoms because it could be cardiac related.   She denies having any fevers, rectal bleeding.    Per Almyra Free the patient needs to be seen in the ER ASAP.  Patient said she would head to the ER.

## 2017-02-14 NOTE — ED Provider Notes (Addendum)
[facility] DEPT Provider Note   CSN: [id number] Arrival date & time: 02/14/17  1746     History   Chief Complaint Chief Complaint  Patient presents with  . Abdominal Pain    HPI [name] is a 76 y.o. female.  HPI [place] of diffuse abdominal pain rating to her back between her scapulae onset approximately 4 years ago which she has daily pain is become worse over the past 2 days. She's felt nauseated yesterday but none today she also had watery diarrhea several episodes yesterday. She denies any fever. Nothing makes pain better or worse. No treatment prior to coming here. Presently pain is mild. She does complain of shortness of breath but it is chronic and unchanged for at least the past 10 years. Denies Cough no fever no urinary symptoms no other associated symptoms. Past Medical History:  Diagnosis Date  . Allergic rhinitis   . Anxiety   . Asthma    pt states she "believe it is more allergies"  . Cholecystitis   . Chronic abdominal pain   . Colon polyp    TCS 2005 TICS [place], TCS/PROPOFOL 2011 SIMPLE [place]  . Depression   . Diverticulosis 2005  . Fatty liver   . Fibromyalgia   . GERD (gastroesophageal reflux disease)   . Gout   . Hyperlipemia   . Hypothyroidism   . Internal hemorrhoids    H/o Rectal bleeding secondary to internal hemorrhoids 04/2010.  . Migraine   . Narcolepsy   . Obesity, morbid (more than 100 lbs over ideal weight or BMI > 40) (HCC)   . Obstructive sleep apnea on CPAP and O2  . Osteoarthritis   . Persistent atrial fibrillation ([place])    a. s/p DCCV 01/2012  . Volume overload    uses torsemide PRN    Patient Active Problem List   Diagnosis Date Noted  . History of colonic polyps   . Diverticulosis of colon without hemorrhage   . Hiatal hernia   . RUQ abdominal pain 02/27/2016  . Dyspnea 05/03/2014  . Acute respiratory failure ([place]) 01/13/2014  . CAP (community acquired pneumonia) 01/12/2014  . RUQ pain 02/05/2013  . Rib pain on right  side 02/05/2013  . Cholelithiasis 09/01/2012  . Atrial fibrillation ([place]) 09/01/2012  . Diastolic CHF ([place]) 72/53/6644  . INTERNAL HEMORRHOIDS 11/06/2010  . FATIGUE 11/06/2010  . Abdominal pain 11/06/2010  . INTERTRIGO, CANDIDAL 06/03/2009  . VARICOSE VEINS LOWER EXTREMITIES W/INFLAMMATION 01/10/2009  . HYPOTHYROIDISM 11/30/2006  . HYPERLIPIDEMIA 11/30/2006  . OBESITY, MORBID 11/30/2006  . ANXIETY 11/30/2006  . DEPRESSION 11/30/2006  . OBSTRUCTIVE SLEEP APNEA 11/30/2006  . MIGRAINE HEADACHE 11/30/2006  . NARCOLEPSY W/CATAPLEXY 11/30/2006  . ALLERGIC RHINITIS 11/30/2006  . ASTHMA 11/30/2006  . GERD 11/30/2006  . OSTEOARTHRITIS 11/30/2006  . FIBROMYALGIA 11/30/2006  . PALPITATIONS 11/30/2006  . COLONIC POLYPS, HX OF 11/30/2006    Past Surgical History:  Procedure Laterality Date  . ABDOMINAL HYSTERECTOMY  FIBROIDS 1982  . BLADDER SUSPENSION  1982  . CARDIOVERSION  02/05/2012   Procedure: CARDIOVERSION;  Surgeon: Laverda Page, MD;  Location: [facility];  Service: Cardiovascular;  Laterality: N/A;  . CARDIOVERSION N/A 06/09/2013   Procedure: CARDIOVERSION;  Surgeon: Laverda Page, MD;  Location: [facility] ENDOSCOPY;  Service: Cardiovascular;  Laterality: N/A;  h&p in file-[place]  . CARDIOVERSION N/A 08/09/2015   Procedure: CARDIOVERSION;  Surgeon: Adrian Prows, MD;  Location: [facility];  Service: Cardiovascular;  Laterality: N/A;  . CARDIOVERSION N/A 09/13/2015  Procedure: CARDIOVERSION;  Surgeon: Adrian Prows, MD;  Location: [facility] ENDOSCOPY;  Service: Cardiovascular;  Laterality: N/A;  . CHOLECYSTECTOMY  09/03/2012   Procedure: LAPAROSCOPIC CHOLECYSTECTOMY;  Surgeon: Jamesetta So, MD;  Location: [facility] ORS;  Service: General;  Laterality: N/A;  . COLONOSCOPY  2005   Dr. Tamala Julian: sigmoid diverticulosis  . COLONOSCOPY  June 2011   Dr. Oneida Alar: internal hemorrhoids, simple adenomas, hyperplastic polyps, needs surveillance in June 2014 with overtube and Propofol  . COLONOSCOPY WITH PROPOFOL N/A  03/19/2016   Dr. Gala Romney: multiple small tubular adenomas, diverticulosis. Surveillance in 5 years   . ERCP  10/01/2012   Procedure: ENDOSCOPIC RETROGRADE CHOLANGIOPANCREATOGRAPHY (ERCP);  Surgeon: Daneil Dolin, MD;  Location: [facility] ORS;  Service: Endoscopy;  Laterality: N/A;  . ESOPHAGOGASTRODUODENOSCOPY (EGD) WITH PROPOFOL N/A 03/19/2016   DR. Rourk: normal esohpagus with small hiatal hernia   . Kidney Stent  1995  . POLYPECTOMY  03/19/2016   Procedure: POLYPECTOMY;  Surgeon: Daneil Dolin, MD;  Location: [facility] ENDO SUITE;  Service: Endoscopy;;  cold snares  . REMOVAL OF STONES  10/01/2012   Procedure: REMOVAL OF STONES;  Surgeon: Daneil Dolin, MD;  Location: [facility] ORS;  Service: Endoscopy;  Laterality: N/A;  . RHINOPLASTY    . SPHINCTEROTOMY  10/01/2012   Procedure: SPHINCTEROTOMY;  Surgeon: Daneil Dolin, MD;  Location: [facility] OR[facility];  Service: Endoscopy;  Laterality: N/A;    OB History    No data available       Home Medications    Prior to Admission medications   Medication Sig Start Date End Date Taking? Authorizing Provider  albuterol (PROVENTIL,VENTOLIN) 90 MCG/ACT inhaler Inhale 2 puffs into the lungs every 6 (six) hours as needed for shortness of breath.     Historical Provider, MD  allopurinol (ZYLOPRIM) 100 MG tablet Take 100 mg by mouth daily.    Historical Provider, MD  apixaban (ELIQUIS) 5 MG TABS tablet Take 5 mg by mouth 2 (two) times daily.    Historical Provider, MD  colchicine 0.6 MG tablet Take 0.6 mg by mouth as needed.    Historical Provider, MD  ergocalciferol (VITAMIN D2) 50000 units capsule Take 50,000 Units by mouth 2 (two) times a week.    Historical Provider, MD  esomeprazole (NEXIUM) 40 MG capsule Take 40 mg by mouth daily at 12 noon. Takes every  3-4 days    Historical Provider, MD  FLUoxetine (PROZAC) 20 MG capsule Take 20 mg by mouth 3 (three) times daily.    Historical Provider, MD  fluticasone (FLONASE) 50 MCG/ACT nasal spray Place 1-2 sprays into both nostrils  daily as needed for allergies or rhinitis.    Historical Provider, MD  furosemide (LASIX) 40 MG tablet Take 40 mg by mouth 2 (two) times daily as needed for fluid.  12/01/15   Historical Provider, MD  hyoscyamine (LEVSIN SL) 0.125 MG SL tablet Place 1 tablet (0.125 mg total) under the tongue every 4 (four) hours as needed. 09/21/16   Annitta Needs, NP  hyoscyamine (LEVSIN SL) 0.125 MG SL tablet Place 1 tablet (0.125 mg total) under the tongue every 4 (four) hours as needed. 02/07/17   Annitta Needs, NP  levothyroxine (SYNTHROID, LEVOTHROID) 50 MCG tablet Take 50 mcg by mouth daily before breakfast.    Historical Provider, MD  LORazepam (ATIVAN) 1 MG tablet Take 0.5 mg by mouth 3 (three) times daily.     Historical Provider, MD  Misc Natural Products (TART CHERRY ADVANCED PO) Take 120 mLs  by mouth daily. *Mixed with water* As needed for gout    Historical Provider, MD  OVER THE COUNTER MEDICATION Vitamin E 800iu by mouth daily    Historical Provider, MD  OXYGEN Inhale 2 L into the lungs at bedtime.    Historical Provider, MD  potassium chloride SA (K-DUR,KLOR-CON) 20 MEQ tablet Take 20 mEq by mouth daily as needed (only takes when takes lasix).    Historical Provider, MD  propranolol ER (INDERAL LA) 80 MG 24 hr capsule Take 80 mg by mouth daily.    Historical Provider, MD  ULORIC 40 MG tablet  10/26/16   Historical Provider, MD    Family History Family History  Problem Relation Age of Onset  . Colon cancer Mother 25  . Diabetes Mother   . Asthma Mother   . Heart disease Mother   . Cerebral aneurysm Father   . Asthma Maternal Aunt   . Asthma Brother   . Heart disease Maternal Grandfather   . Heart disease Maternal Grandmother     Social History Social History  Substance Use Topics  . Smoking status: Former Smoker    Packs/day: 0.50    Years: 30.00    Quit date: 10/03/1989  . Smokeless tobacco: Never Used     Comment: Quit in 1992  . Alcohol use No     Allergies   Demerol  [meperidine]; Atorvastatin; Uloric [febuxostat]; and Zocor [simvastatin - high dose]   Review of Systems Review of Systems  Constitutional: Negative.   HENT: Negative.   Respiratory: Positive for shortness of breath.   Cardiovascular: Negative.   Gastrointestinal: Positive for abdominal pain and diarrhea.  Musculoskeletal: Negative.   Skin: Negative.   Neurological: Negative.   Psychiatric/Behavioral: Negative.   All other systems reviewed and are negative.    Physical Exam Updated Vital Signs BP 111/76 (BP Location: Left Arm)   Pulse 65   Temp 97.9 F (36.6 C) (Oral)   Resp 20   Ht 5\' 2"  (1.575 m)   Wt 269 lb (122 kg)   SpO2 98%   BMI 49.20 kg/m   Physical Exam  Constitutional: She appears well-developed and well-nourished. No distress.  HENT:  Head: Normocephalic and atraumatic.  Eyes: Conjunctivae are normal. Pupils are equal, round, and reactive to light.  Neck: Neck supple. No tracheal deviation present. No thyromegaly present.  Cardiovascular: Normal rate and regular rhythm.   No murmur heard. Pulmonary/Chest: Effort normal and breath sounds normal.  Abdominal: Soft. Bowel sounds are normal. She exhibits no distension. There is tenderness.  Morbidly obese and are over epigastric  Musculoskeletal: Normal range of motion. She exhibits no edema or tenderness.  Neurological: She is alert. Coordination normal.  Skin: Skin is warm and dry. No rash noted.  Psychiatric: She has a normal mood and affect.  Nursing note and vitals reviewed.    ED Treatments / Results  Labs (all labs ordered are listed, but only abnormal results are displayed) Labs Reviewed  BASIC METABOLIC PANEL  CBC  URINALYSIS, COMPLETE (UACMP) WITH MICROSCOPIC  TROPONIN I    EKG  EKG Interpretation  Date/Time:  Thursday February 14 2017 18:31:32 EDT Ventricular Rate:  67 PR Interval:    QRS Duration: 76 QT Interval:  402 QTC Calculation: 424 R Axis:   -22 Text Interpretation:  Atrial  fibrillation Low voltage QRS Abnormal ECG No significant change since last tracing Confirmed by Winfred Leeds  MD, Melyssa Signor [phone]) on 02/14/2017 7:14:44 PM       Radiology  Dg Chest 2 View  Result Date: 02/14/2017 CLINICAL DATA:  Shortness of breath and mid chest pain EXAM: CHEST  2 VIEW COMPARISON:  11/13/2016 FINDINGS: Mild hyperinflation. No focal infiltrate or effusion. Stable cardiomediastinal silhouette. Atherosclerosis. No pneumothorax. IMPRESSION: No acute infiltrate or edema. Electronically Signed   By: Donavan Foil M.D.   On: 02/14/2017 19:10    Procedures Procedures (including critical care time)  Medications Ordered in ED Medications - No data to display  Results for orders placed or performed during the hospital encounter of 02/14/17  CBC  Result Value Ref Range   WBC 8.2 4.0 - 10.5 K/uL   RBC 4.65 3.87 - 5.11 MIL/uL   Hemoglobin 13.8 12.0 - 15.0 g/dL   HCT 42.1 36.0 - 46.0 %   MCV 90.5 78.0 - 100.0 fL   MCH 29.7 26.0 - 34.0 pg   MCHC 32.8 30.0 - 36.0 g/dL   RDW 13.1 11.5 - 15.5 %   Platelets 249 150 - 400 K/uL  Urinalysis, Complete w Microscopic  Result Value Ref Range   Color, Urine YELLOW YELLOW   APPearance CLEAR CLEAR   Specific Gravity, Urine 1.008 1.005 - 1.030   pH 5.0 5.0 - 8.0   Glucose, UA NEGATIVE NEGATIVE mg/dL   Hgb urine dipstick NEGATIVE NEGATIVE   Bilirubin Urine NEGATIVE NEGATIVE   Ketones, ur NEGATIVE NEGATIVE mg/dL   Protein, ur NEGATIVE NEGATIVE mg/dL   Nitrite NEGATIVE NEGATIVE   Leukocytes, UA NEGATIVE NEGATIVE   RBC / HPF 0-5 0 - 5 RBC/hpf   WBC, UA 0-5 0 - 5 WBC/hpf   Bacteria, UA RARE (A) NONE SEEN  Troponin I  Result Value Ref Range   Troponin I <0.03 <0.03 ng/mL  Comprehensive metabolic panel  Result Value Ref Range   Sodium 139 135 - 145 mmol/L   Potassium 3.7 3.5 - 5.1 mmol/L   Chloride 105 101 - 111 mmol/L   CO2 26 22 - 32 mmol/L   Glucose, Bld 96 65 - 99 mg/dL   BUN 14 6 - 20 mg/dL   Creatinine, Ser 0.61 0.44 - 1.00 mg/dL    Calcium 8.9 8.9 - 10.3 mg/dL   Total Protein 6.9 6.5 - 8.1 g/dL   Albumin 3.7 3.5 - 5.0 g/dL   AST 20 15 - 41 U/L   ALT 20 14 - 54 U/L   Alkaline Phosphatase 81 38 - 126 U/L   Total Bilirubin 0.5 0.3 - 1.2 mg/dL   GFR calc non Af Amer >60 >60 mL/min   GFR calc Af Amer >60 >60 mL/min   Anion gap 8 5 - 15  Lipase, blood  Result Value Ref Range   Lipase 33 11 - 51 U/L   Dg Chest 2 View  Result Date: 02/14/2017 CLINICAL DATA:  Shortness of breath and mid chest pain EXAM: CHEST  2 VIEW COMPARISON:  11/13/2016 FINDINGS: Mild hyperinflation. No focal infiltrate or effusion. Stable cardiomediastinal silhouette. Atherosclerosis. No pneumothorax. IMPRESSION: No acute infiltrate or edema. Electronically Signed   By: Donavan Foil M.D.   On: 02/14/2017 19:10   Ct Abdomen Pelvis W Contrast  Result Date: 02/14/2017 CLINICAL DATA:  Right upper quadrant abdominal pain and distension. Nausea yesterday. Chest pain. EXAM: CT ABDOMEN AND PELVIS WITH CONTRAST TECHNIQUE: Multidetector CT imaging of the abdomen and pelvis was performed using the standard protocol following bolus administration of intravenous contrast. CONTRAST:  160mL ISOVUE-300 IOPAMIDOL (ISOVUE-300) INJECTION 61% COMPARISON:  02/29/2016.  MR dated 09/26/2016. FINDINGS: Lower chest: Stable  linear scarring in the lingula. Hepatobiliary: Cholecystectomy clips.  Normal appearing liver. Pancreas: Unremarkable. No pancreatic ductal dilatation or surrounding inflammatory changes. Spleen: Multiple small calcified granuloma. Normal in size and shape. Small accessory splenule. Adrenals/Urinary Tract: Adrenal glands are unremarkable. Kidneys are normal, without renal calculi, focal lesion, or hydronephrosis. Bladder is unremarkable. Stomach/Bowel: Large number of colonic diverticula, specially in the sigmoid region. No evidence of diverticulitis. Normal appearing appendix and small bowel. Small hiatal hernia with sliding and paraesophageal components.  Vascular/Lymphatic: Mild atheromatous arterial calcifications, including the abdominal aorta. Reproductive: Surgically absent uterus.  No adnexal masses. Other: Very small umbilical hernia containing fat. Musculoskeletal: Mild lumbar and lower thoracic spine degenerative changes. IMPRESSION: 1. No acute abnormality. 2. Extensive colonic diverticulosis. 3. Small hiatal hernia with sliding and paraesophageal components. 4. Mild aortic atherosclerosis. Electronically Signed   By: Claudie Revering M.D.   On: 02/14/2017 21:27   Initial Impression / Assessment and Plan / ED Course  I have reviewed the triage vital signs and the nursing notes.  Pertinent labs & imaging results that were available during my care of the patient were reviewed by me and considered in my medical decision making (see chart for details).     10:10 PM patient is alert and in no distress. Respiratory rate counted at 20 breaths permitted by me .States pain is much improved over yesterday. She is not lightheaded on standing. Plan bland diet for the next 24 hours. She can take Tylenol for pain. Follow-up with PMD as needed Imodium as needed for diarrhea. Avoid dairy. Encourage oral hydration. Final Clinical Impressions(s) / ED Diagnoses  Diagnosis#1 Generalized abdominal pain #2 diarrhea Final diagnoses:  None    New Prescriptions New Prescriptions   No medications on file     Orlie Dakin, MD 02/14/17 2219    Orlie Dakin, MD 02/14/17 2219

## 2017-02-14 NOTE — ED Notes (Signed)
Patient transported to CT 

## 2017-02-14 NOTE — ED Notes (Signed)
Pt ambulatory to waiting room. Pt verbalized understanding of discharge instructions.   

## 2017-02-14 NOTE — Discharge Instructions (Signed)
It is safe to take Tylenol every 4 hours as needed for pain. Avoid milk or foods containing milk such as cheese or ice cream all having diarrhea.Make sure that you drink at least six 8 ounce glasses of water or Gatorade each day in order to stay well-hydrated. Call your primary care physician to arrange to be seen if not feeling better in 2 or 3 days. Eat a bland diet such as scrambled eggs, toast, white rice for the next 24 hours. Return if concern for any reason

## 2017-02-14 NOTE — Telephone Encounter (Signed)
Reviewed

## 2017-02-18 ENCOUNTER — Telehealth: Payer: Self-pay

## 2017-02-18 NOTE — Telephone Encounter (Signed)
Pt went to the ED on Friday, she said they did not do anything for her. CT was normal. Pt said she was told to take tylenol. Nothing is helping the pain, she said she feels better when she lays down flat. She describes it as similar to the pain she had before with her gallbladder.  Daughter wants AB to review all of her tests to see if there is a blockage that someone is missing. She said she felt like AB was compassionate and would look harder.  She said she knows this isnt phycological, she said she doesn't have time to be in pain. She also said the doctor at [facility] didn't help her either.   She would really like to talk to AB. Her number is [phone].

## 2017-02-18 NOTE — Telephone Encounter (Signed)
I spoke with patient at length. She has had chronic RUQ/back pain for some time now. We have completed an extensive evaluation including referral to [facility]. She notes clay-colored stools intermittently. LFTs are normal. MRI and CT are unrevealing. She also notes shortness of breath and chest discomfort at times. She continues to report her pain similar to when she had her cholecystectomy.  With report of chest pain, shortness of breath, refer to cardiology (Dr. Einar Gip).   I will discuss further with Dr. Gala Romney any other evaluation we need to do (specifically in light of clay-colored stool).

## 2017-02-19 ENCOUNTER — Other Ambulatory Visit: Payer: Self-pay

## 2017-02-19 DIAGNOSIS — R079 Chest pain, unspecified: Secondary | ICD-10-CM

## 2017-02-19 NOTE — Telephone Encounter (Signed)
Cardiology referral has been made

## 2017-02-27 DIAGNOSIS — I481 Persistent atrial fibrillation: Secondary | ICD-10-CM | POA: Diagnosis not present

## 2017-02-27 DIAGNOSIS — G4733 Obstructive sleep apnea (adult) (pediatric): Secondary | ICD-10-CM | POA: Diagnosis not present

## 2017-02-27 DIAGNOSIS — G4734 Idiopathic sleep related nonobstructive alveolar hypoventilation: Secondary | ICD-10-CM | POA: Diagnosis not present

## 2017-02-27 DIAGNOSIS — R0602 Shortness of breath: Secondary | ICD-10-CM | POA: Diagnosis not present

## 2017-03-06 DIAGNOSIS — L409 Psoriasis, unspecified: Secondary | ICD-10-CM | POA: Diagnosis not present

## 2017-03-06 DIAGNOSIS — M109 Gout, unspecified: Secondary | ICD-10-CM | POA: Diagnosis not present

## 2017-03-06 DIAGNOSIS — B379 Candidiasis, unspecified: Secondary | ICD-10-CM | POA: Diagnosis not present

## 2017-03-06 DIAGNOSIS — E039 Hypothyroidism, unspecified: Secondary | ICD-10-CM | POA: Diagnosis not present

## 2017-03-06 DIAGNOSIS — I480 Paroxysmal atrial fibrillation: Secondary | ICD-10-CM | POA: Diagnosis not present

## 2017-03-06 DIAGNOSIS — Z6841 Body Mass Index (BMI) 40.0 and over, adult: Secondary | ICD-10-CM | POA: Diagnosis not present

## 2017-03-06 DIAGNOSIS — F330 Major depressive disorder, recurrent, mild: Secondary | ICD-10-CM | POA: Diagnosis not present

## 2017-03-13 ENCOUNTER — Telehealth: Payer: Self-pay | Admitting: Internal Medicine

## 2017-03-13 NOTE — Telephone Encounter (Signed)
Ginger, please call the pt and explain the reason for the referral. Thanks.

## 2017-03-13 NOTE — Telephone Encounter (Signed)
Spoke with patient and she understands that it was for chest pain and shortness of breath.

## 2017-03-13 NOTE — Telephone Encounter (Signed)
[phone] PATIENT STATED [name] WANTS HER TO SEE A CARDIOLOGIST AND SHE WANTS TO KNOW WHY

## 2017-03-18 DIAGNOSIS — E662 Morbid (severe) obesity with alveolar hypoventilation: Secondary | ICD-10-CM | POA: Diagnosis not present

## 2017-03-18 DIAGNOSIS — I10 Essential (primary) hypertension: Secondary | ICD-10-CM | POA: Diagnosis not present

## 2017-03-18 DIAGNOSIS — R0789 Other chest pain: Secondary | ICD-10-CM | POA: Diagnosis not present

## 2017-03-18 DIAGNOSIS — I482 Chronic atrial fibrillation: Secondary | ICD-10-CM | POA: Diagnosis not present

## 2017-03-22 DIAGNOSIS — G4734 Idiopathic sleep related nonobstructive alveolar hypoventilation: Secondary | ICD-10-CM | POA: Diagnosis not present

## 2017-03-22 DIAGNOSIS — I481 Persistent atrial fibrillation: Secondary | ICD-10-CM | POA: Diagnosis not present

## 2017-03-22 DIAGNOSIS — G4733 Obstructive sleep apnea (adult) (pediatric): Secondary | ICD-10-CM | POA: Diagnosis not present

## 2017-03-22 DIAGNOSIS — J449 Chronic obstructive pulmonary disease, unspecified: Secondary | ICD-10-CM | POA: Diagnosis not present

## 2017-03-25 DIAGNOSIS — I4891 Unspecified atrial fibrillation: Secondary | ICD-10-CM | POA: Diagnosis not present

## 2017-03-25 DIAGNOSIS — R0789 Other chest pain: Secondary | ICD-10-CM | POA: Diagnosis not present

## 2017-03-25 DIAGNOSIS — R0602 Shortness of breath: Secondary | ICD-10-CM | POA: Diagnosis not present

## 2017-04-16 DIAGNOSIS — G4733 Obstructive sleep apnea (adult) (pediatric): Secondary | ICD-10-CM | POA: Diagnosis not present

## 2017-04-16 DIAGNOSIS — J449 Chronic obstructive pulmonary disease, unspecified: Secondary | ICD-10-CM | POA: Diagnosis not present

## 2017-04-17 DIAGNOSIS — J449 Chronic obstructive pulmonary disease, unspecified: Secondary | ICD-10-CM | POA: Diagnosis not present

## 2017-04-17 DIAGNOSIS — G4733 Obstructive sleep apnea (adult) (pediatric): Secondary | ICD-10-CM | POA: Diagnosis not present

## 2017-05-06 DIAGNOSIS — N3946 Mixed incontinence: Secondary | ICD-10-CM | POA: Diagnosis not present

## 2017-05-06 DIAGNOSIS — L409 Psoriasis, unspecified: Secondary | ICD-10-CM | POA: Diagnosis not present

## 2017-05-06 DIAGNOSIS — Z6841 Body Mass Index (BMI) 40.0 and over, adult: Secondary | ICD-10-CM | POA: Diagnosis not present

## 2017-05-06 DIAGNOSIS — E039 Hypothyroidism, unspecified: Secondary | ICD-10-CM | POA: Diagnosis not present

## 2017-05-06 DIAGNOSIS — F330 Major depressive disorder, recurrent, mild: Secondary | ICD-10-CM | POA: Diagnosis not present

## 2017-05-06 DIAGNOSIS — B0229 Other postherpetic nervous system involvement: Secondary | ICD-10-CM | POA: Diagnosis not present

## 2017-05-06 DIAGNOSIS — I480 Paroxysmal atrial fibrillation: Secondary | ICD-10-CM | POA: Diagnosis not present

## 2017-05-06 DIAGNOSIS — E559 Vitamin D deficiency, unspecified: Secondary | ICD-10-CM | POA: Diagnosis not present

## 2017-05-06 DIAGNOSIS — Z0001 Encounter for general adult medical examination with abnormal findings: Secondary | ICD-10-CM | POA: Diagnosis not present

## 2017-05-06 DIAGNOSIS — M109 Gout, unspecified: Secondary | ICD-10-CM | POA: Diagnosis not present

## 2017-05-14 DIAGNOSIS — Z6841 Body Mass Index (BMI) 40.0 and over, adult: Secondary | ICD-10-CM | POA: Diagnosis not present

## 2017-05-14 DIAGNOSIS — N3942 Incontinence without sensory awareness: Secondary | ICD-10-CM | POA: Diagnosis not present

## 2017-07-18 ENCOUNTER — Encounter (HOSPITAL_COMMUNITY): Payer: Self-pay | Admitting: Emergency Medicine

## 2017-07-18 ENCOUNTER — Emergency Department (HOSPITAL_COMMUNITY): Payer: Medicare Other

## 2017-07-18 ENCOUNTER — Emergency Department (HOSPITAL_COMMUNITY)
Admission: EM | Admit: 2017-07-18 | Discharge: 2017-07-18 | Disposition: A | Discharge status: Home / Self Care | Payer: Medicare Other | Attending: Emergency Medicine | Admitting: Emergency Medicine

## 2017-07-18 DIAGNOSIS — R42 Dizziness and giddiness: Secondary | ICD-10-CM | POA: Diagnosis not present

## 2017-07-18 DIAGNOSIS — R079 Chest pain, unspecified: Secondary | ICD-10-CM | POA: Diagnosis not present

## 2017-07-18 DIAGNOSIS — R002 Palpitations: Secondary | ICD-10-CM | POA: Insufficient documentation

## 2017-07-18 DIAGNOSIS — Z87891 Personal history of nicotine dependence: Secondary | ICD-10-CM | POA: Diagnosis not present

## 2017-07-18 DIAGNOSIS — R270 Ataxia, unspecified: Secondary | ICD-10-CM | POA: Diagnosis not present

## 2017-07-18 DIAGNOSIS — J45909 Unspecified asthma, uncomplicated: Secondary | ICD-10-CM | POA: Diagnosis not present

## 2017-07-18 DIAGNOSIS — R0602 Shortness of breath: Secondary | ICD-10-CM | POA: Diagnosis not present

## 2017-07-18 DIAGNOSIS — H9193 Unspecified hearing loss, bilateral: Secondary | ICD-10-CM | POA: Insufficient documentation

## 2017-07-18 DIAGNOSIS — Z79899 Other long term (current) drug therapy: Secondary | ICD-10-CM | POA: Insufficient documentation

## 2017-07-18 DIAGNOSIS — I5030 Unspecified diastolic (congestive) heart failure: Secondary | ICD-10-CM | POA: Diagnosis not present

## 2017-07-18 DIAGNOSIS — Z7901 Long term (current) use of anticoagulants: Secondary | ICD-10-CM | POA: Insufficient documentation

## 2017-07-18 DIAGNOSIS — E039 Hypothyroidism, unspecified: Secondary | ICD-10-CM | POA: Insufficient documentation

## 2017-07-18 LAB — CBC
HEMATOCRIT: 42.6 % (ref 36.0–46.0)
HEMOGLOBIN: 13.8 g/dL (ref 12.0–15.0)
MCH: 29.1 pg (ref 26.0–34.0)
MCHC: 32.4 g/dL (ref 30.0–36.0)
MCV: 89.9 fL (ref 78.0–100.0)
Platelets: 230 10*3/uL (ref 150–400)
RBC: 4.74 MIL/uL (ref 3.87–5.11)
RDW: 13.2 % (ref 11.5–15.5)
WBC: 5.8 10*3/uL (ref 4.0–10.5)

## 2017-07-18 LAB — I-STAT TROPONIN, ED: Troponin i, poc: 0.01 ng/mL (ref 0.00–0.08)

## 2017-07-18 LAB — BASIC METABOLIC PANEL
ANION GAP: 8 (ref 5–15)
BUN: 16 mg/dL (ref 6–20)
CHLORIDE: 106 mmol/L (ref 101–111)
CO2: 28 mmol/L (ref 22–32)
Calcium: 9.4 mg/dL (ref 8.9–10.3)
Creatinine, Ser: 0.73 mg/dL (ref 0.44–1.00)
Glucose, Bld: 96 mg/dL (ref 65–99)
POTASSIUM: 4 mmol/L (ref 3.5–5.1)
SODIUM: 142 mmol/L (ref 135–145)

## 2017-07-18 NOTE — ED Triage Notes (Signed)
SOB and on and off cp x 1 week.  Current episode started yesterday evening.  resp labored, more with activity

## 2017-07-18 NOTE — ED Notes (Signed)
Patient transported to X-ray 

## 2017-07-18 NOTE — Discharge Instructions (Signed)
Today's MRI was negative no acute problems in the brain. Recommend making an appointment with your primary care doctor to go over your concerns. Also recommend getting further information about the hearing testing that was done at [facility] suspect her primary care doctor can provide G with this information. Return for any new or worse symptoms.

## 2017-07-18 NOTE — ED Provider Notes (Signed)
[facility] DEPT Provider Note   CSN: [id number] Arrival date & time: 07/18/17  1157     History   Chief Complaint Chief Complaint  Patient presents with  . Respiratory Distress    HPI [name] is a 76 y.o. female.  Patient presents by POV with the complaint of shortness of breath and on and off chest pain for a week. Current episode started yesterday evening. Patient arrived with the respirations labored. Increased with activity. But upon further interviewing of the patient patient had multiple complaints. The chest pain and shortness of breath on and off about ongoing for many weeks. Yesterday when she was having a new C Pap mask fitted she got very dizzy and felt like she was having palpitations had a lot of popping in her ears and had a lot of hearing issues. Patient's had some of these symptoms on and off for a while but got worse yesterday. She contacted her regular doctor they recommended her being evaluated last night. They followed her up this morning and found that she still had not been seen and recommended that she get seen so she was brought in by her daughter. Patient had extensive hearing testing done at [place]. Her primary care doctor is in the [place] area. Patient has a known history of atrophic fibrillation is on blood thinners for that. And is on a beta blocker. Patient talks about intermittent swishing sounds in her ears intermittent dizziness without a true vertigo component.      Past Medical History:  Diagnosis Date  . Allergic rhinitis   . Anxiety   . Asthma    pt states she "believe it is more allergies"  . Cholecystitis   . Chronic abdominal pain   . Colon polyp    TCS 2005 TICS [place], TCS/PROPOFOL 2011 SIMPLE [place]OMAS  . Depression   . Diverticulosis 2005  . Fatty liver   . Fibromyalgia   . GERD (gastroesophageal reflux disease)   . Gout   . Hyperlipemia   . Hypothyroidism   . Internal hemorrhoids    H/o Rectal bleeding secondary to  internal hemorrhoids 04/2010.  . Migraine   . Narcolepsy   . Obesity, morbid (more than 100 lbs over ideal weight or BMI > 40) (HCC)   . Obstructive sleep apnea on CPAP and O2  . Osteoarthritis   . Persistent atrial fibrillation ([place])    a. s/p DCCV 01/2012  . Volume overload    uses torsemide PRN    Patient Active Problem List   Diagnosis Date Noted  . History of colonic polyps   . Diverticulosis of colon without hemorrhage   . Hiatal hernia   . RUQ abdominal pain 02/27/2016  . Dyspnea 05/03/2014  . Acute respiratory failure ([place]) 01/13/2014  . CAP (community acquired pneumonia) 01/12/2014  . RUQ pain 02/05/2013  . Rib pain on right side 02/05/2013  . Cholelithiasis 09/01/2012  . Atrial fibrillation ([place]) 09/01/2012  . Diastolic CHF ([place]) 50/07/3817  . INTERNAL HEMORRHOIDS 11/06/2010  . FATIGUE 11/06/2010  . Abdominal pain 11/06/2010  . INTERTRIGO, CANDIDAL 06/03/2009  . VARICOSE VEINS LOWER EXTREMITIES W/INFLAMMATION 01/10/2009  . HYPOTHYROIDISM 11/30/2006  . HYPERLIPIDEMIA 11/30/2006  . OBESITY, MORBID 11/30/2006  . ANXIETY 11/30/2006  . DEPRESSION 11/30/2006  . OBSTRUCTIVE SLEEP APNEA 11/30/2006  . MIGRAINE HEADACHE 11/30/2006  . NARCOLEPSY W/CATAPLEXY 11/30/2006  . ALLERGIC RHINITIS 11/30/2006  . ASTHMA 11/30/2006  . GERD 11/30/2006  . OSTEOARTHRITIS 11/30/2006  . FIBROMYALGIA 11/30/2006  . PALPITATIONS  11/30/2006  . COLONIC POLYPS, HX OF 11/30/2006    Past Surgical History:  Procedure Laterality Date  . ABDOMINAL HYSTERECTOMY  FIBROIDS 1982  . BLADDER SUSPENSION  1982  . CARDIOVERSION  02/05/2012   Procedure: CARDIOVERSION;  Surgeon: Laverda Page, MD;  Location: [facility];  Service: Cardiovascular;  Laterality: N/A;  . CARDIOVERSION N/A 06/09/2013   Procedure: CARDIOVERSION;  Surgeon: Laverda Page, MD;  Location: [facility] ENDOSCOPY;  Service: Cardiovascular;  Laterality: N/A;  h&p in file-[facility]  . CARDIOVERSION N/A 08/09/2015   Procedure: CARDIOVERSION;   Surgeon: Adrian Prows, MD;  Location: [facility] ENDOSCOPY;  Service: Cardiovascular;  Laterality: N/A;  . CARDIOVERSION N/A 09/13/2015   Procedure: CARDIOVERSION;  Surgeon: Adrian Prows, MD;  Location: [facility] ENDOSCOPY;  Service: Cardiovascular;  Laterality: N/A;  . CHOLECYSTECTOMY  09/03/2012   Procedure: LAPAROSCOPIC CHOLECYSTECTOMY;  Surgeon: Jamesetta So, MD;  Location: [facility];  Service: General;  Laterality: N/A;  . COLONOSCOPY  2005   Dr. Tamala Julian: sigmoid diverticulosis  . COLONOSCOPY  June 2011   Dr. Oneida Alar: internal hemorrhoids, simple adenomas, hyperplastic polyps, needs surveillance in June 2014 with overtube and Propofol  . COLONOSCOPY WITH PROPOFOL N/A 03/19/2016   Dr. Gala Romney: multiple small tubular adenomas, diverticulosis. Surveillance in 5 years   . ERCP  10/01/2012   Procedure: ENDOSCOPIC RETROGRADE CHOLANGIOPANCREATOGRAPHY (ERCP);  Surgeon: Daneil Dolin, MD;  Location: [facility];  Service: Endoscopy;  Laterality: N/A;  . ESOPHAGOGASTRODUODENOSCOPY (EGD) WITH PROPOFOL N/A 03/19/2016   DR. Rourk: normal esohpagus with small hiatal hernia   . Kidney Stent  1995  . POLYPECTOMY  03/19/2016   Procedure: POLYPECTOMY;  Surgeon: Daneil Dolin, MD;  [facility];  Service: Endoscopy;;  cold snares  . REMOVAL OF STONES  10/01/2012   Procedure: REMOVAL OF STONES;  Surgeon: Daneil Dolin, MD;  Location: [facility];  Service: Endoscopy;  Laterality: N/A;  . RHINOPLASTY    . SPHINCTEROTOMY  10/01/2012   Procedure: SPHINCTEROTOMY;  Surgeon: Daneil Dolin, MD;  Location: [facility];  Service: Endoscopy;  Laterality: N/A;    OB History    No data available       Home Medications    Prior to Admission medications   Medication Sig Start Date End Date Taking? Authorizing Provider  albuterol (PROVENTIL,VENTOLIN) 90 MCG/ACT inhaler Inhale 2 puffs into the lungs every 6 (six) hours as needed for wheezing or shortness of breath.    Yes [provider]  allopurinol (ZYLOPRIM) 100 MG tablet Take 100  mg by mouth daily.   Yes [provider]  apixaban (ELIQUIS) 5 MG TABS tablet Take 5 mg by mouth 2 (two) times daily.   Yes [provider]  Cholecalciferol (VITAMIN D3) 5000 units CAPS Take 1 capsule by mouth daily.   Yes [provider]  esomeprazole (NEXIUM) 40 MG capsule Take 40 mg by mouth every evening.    Yes [provider]  fluticasone (FLONASE) 50 MCG/ACT nasal spray Place 2 sprays into both nostrils every evening.    Yes [provider]  hydrocortisone cream 1 % Apply 1 application topically daily as needed for itching.   Yes [provider]  levothyroxine (SYNTHROID, LEVOTHROID) 50 MCG tablet Take 50 mcg by mouth daily before breakfast.   Yes [provider]  LORazepam (ATIVAN) 0.5 MG tablet Take 0.5 mg by mouth 3 (three) times daily.   Yes [provider]  miconazole (MICOTIN) 2 % cream Apply 1 application topically daily as needed (  yeast).   Yes [provider]  Misc Natural Products (TART CHERRY ADVANCED PO) Take 120 mLs by mouth daily.    Yes [provider]  propranolol (INNOPRAN XL) 80 MG 24 hr capsule Take 80 mg by mouth daily.   Yes [provider]    Family History Family History  Problem Relation Age of Onset  . Colon cancer Mother 53  . Diabetes Mother   . Asthma Mother   . Heart disease Mother   . Cerebral aneurysm Father   . Asthma Maternal Aunt   . Asthma Brother   . Heart disease Maternal Grandfather   . Heart disease Maternal Grandmother     Social History Social History  Substance Use Topics  . Smoking status: Former Smoker    Packs/day: 0.50    Years: 30.00    Quit date: 10/03/1989  . Smokeless tobacco: Never Used     Comment: Quit in 1992  . Alcohol use No     Allergies   Demerol [meperidine]; Atorvastatin; Uloric [febuxostat]; and Zocor [simvastatin - high dose]   Review of Systems Review of Systems  Constitutional: Negative for fever.  HENT:  Positive for hearing loss. Negative for congestion.   Eyes: Negative for visual disturbance.  Respiratory: Positive for shortness of breath.   Cardiovascular: Positive for chest pain and palpitations.  Gastrointestinal: Negative for abdominal pain.  Genitourinary: Negative for dysuria.  Musculoskeletal: Negative for back pain.  Skin: Negative for rash.  Neurological: Positive for dizziness, weakness and light-headedness. Negative for speech difficulty, numbness and headaches.  Hematological: Bruises/bleeds easily.  Psychiatric/Behavioral: Negative for confusion.     Physical Exam Updated Vital Signs BP (!) 121/92 (BP Location: Left Wrist)   Pulse 72   Temp (!) 97.5 F (36.4 C) (Oral)   Resp 20   Ht 1.575 m (5\' 2" )   Wt 122 kg (269 lb)   SpO2 100%   BMI 49.20 kg/m   Physical Exam  Constitutional: She is oriented to person, place, and time. She appears well-developed and well-nourished. No distress.  HENT:  Head: Normocephalic and atraumatic.  Right Ear: External ear normal.  Left Ear: External ear normal.  Mouth/Throat: Oropharynx is clear and moist.  Eyes: Pupils are equal, round, and reactive to light. Conjunctivae and EOM are normal.  Neck: Normal range of motion. Neck supple.  Cardiovascular: Normal rate and regular rhythm.   Pulmonary/Chest: Effort normal and breath sounds normal. No respiratory distress.  Abdominal: Soft. Bowel sounds are normal. There is no tenderness.  Musculoskeletal: Normal range of motion.  Neurological: She is alert and oriented to person, place, and time. No cranial nerve deficit or sensory deficit. She exhibits normal muscle tone. Coordination normal.  Skin: Skin is warm.  Nursing note and vitals reviewed.    ED Treatments / Results  Labs (all labs ordered are listed, but only abnormal results are displayed) Labs Reviewed  BASIC METABOLIC PANEL  CBC  I-STAT TROPONIN, ED    EKG  EKG Interpretation  Date/Time:  Thursday July 18 2017 12:08:07 EDT Ventricular Rate:  67 PR Interval:    QRS Duration: 99 QT Interval:  406 QTC Calculation: 429 R Axis:   8 Text Interpretation:  Age not entered, assumed to be  76 years old for purpose of ECG interpretation Atrial fibrillation Low voltage, precordial leads Anteroseptal infarct, age indeterminate No significant change since last tracing Confirmed by Fredia Sorrow [phone]) on 07/18/2017 12:19:24 PM       Radiology Dg  Chest 2 View  Result Date: 07/18/2017 CLINICAL DATA:  Chest shortness breath. EXAM: CHEST  2 VIEW COMPARISON:  Chest x-ray dated February 14, 2017. FINDINGS: The cardiomediastinal silhouette is normal in size. Normal pulmonary vascularity. Slightly accentuated interstitial markings. No focal consolidation, pleural effusion, or pneumothorax. IMPRESSION: No active cardiopulmonary disease. Electronically Signed   By: Titus Dubin M.D.   On: 07/18/2017 13:24   Mr Brain Wo Contrast (neuro Protocol)  Result Date: 07/18/2017 CLINICAL DATA:  76 year old female with ataxia onset today. EXAM: MRI HEAD WITHOUT CONTRAST TECHNIQUE: Multiplanar, multiecho pulse sequences of the brain and surrounding structures were obtained without intravenous contrast. COMPARISON:  Head CT without contrast 09/20/2005. FINDINGS: Brain: No restricted diffusion to suggest acute infarction. No midline shift, mass effect, evidence of mass lesion, ventriculomegaly, extra-axial collection or acute intracranial hemorrhage. Cervicomedullary junction and pituitary are within normal limits. Cerebral volume is within normal limits for age. Largely unremarkable for age Heppler and white matter signal throughout the brain. Minimal cerebral white matter nonspecific T2 and FLAIR hyperintensity, and mild patchy T2 heterogeneity in the pons. No cortical encephalomalacia or chronic cerebral blood products identified. The deep Pascucci matter nuclei and cerebellum appear normal. Vascular: Major intracranial vascular flow  voids are preserved. Skull and upper cervical spine: Negative. Visualized bone marrow signal is within normal limits. Sinuses/Orbits: Normal orbits soft tissues. Mild to moderate paranasal sinus mucosal thickening bilaterally, most pronounced in the ethmoids. Other: Mastoid air cells are clear. Visible internal auditory structures appear normal. Scalp and face soft tissues appear negative. IMPRESSION: 1. No acute intracranial abnormality and largely unremarkable for age noncontrast MRI appearance of the brain; mild for age nonspecific signal changes in the pons. 2. Mild to moderate paranasal sinus inflammation. Electronically Signed   By: Genevie Ann M.D.   On: 07/18/2017 15:04    Procedures Procedures (including critical care time)  Medications Ordered in ED Medications - No data to display   Initial Impression / Assessment and Plan / ED Course  I have reviewed the triage vital signs and the nursing notes.  Pertinent labs & imaging results that were available during my care of the patient were reviewed by me and considered in my medical decision making (see chart for details).    Patient's main concern seems to be the symptoms that developed yesterday when she was being fitted for the C Pap mask. Based on this MRI of brain was done without any acute findings. Patient's tympanic membranes and external ear canals are normal. In addition patient's chest x-rays negative her oxygen saturations of been fine. Cardiac monitoring showed the atrial fibrillation that was well rate controlled. Heart rate in the 60s. Troponin was negative. EKG without any acute changes.  Recommend close follow-up back with her primary care Dr. for cardiology results [facility]. And did discuss with her primary care doctor concerns.  MRI showed no evidence of any neoplastic process or any evidence of stroke.  Final Clinical Impressions(s) / ED Diagnoses   Final diagnoses:  Dizziness  Palpitations  Bilateral  hearing loss, unspecified hearing loss type    New Prescriptions New Prescriptions   No medications on file     Fredia Sorrow, MD 07/18/17 1646

## 2017-08-07 DIAGNOSIS — Z6841 Body Mass Index (BMI) 40.0 and over, adult: Secondary | ICD-10-CM | POA: Diagnosis not present

## 2017-08-07 DIAGNOSIS — E039 Hypothyroidism, unspecified: Secondary | ICD-10-CM | POA: Diagnosis not present

## 2017-08-07 DIAGNOSIS — F330 Major depressive disorder, recurrent, mild: Secondary | ICD-10-CM | POA: Diagnosis not present

## 2017-08-07 DIAGNOSIS — H9193 Unspecified hearing loss, bilateral: Secondary | ICD-10-CM | POA: Diagnosis not present

## 2017-08-07 DIAGNOSIS — I480 Paroxysmal atrial fibrillation: Secondary | ICD-10-CM | POA: Diagnosis not present

## 2017-08-07 DIAGNOSIS — H8103 Meniere's disease, bilateral: Secondary | ICD-10-CM | POA: Diagnosis not present

## 2017-08-13 ENCOUNTER — Encounter: Payer: Self-pay | Admitting: Gastroenterology

## 2017-08-13 ENCOUNTER — Ambulatory Visit (INDEPENDENT_AMBULATORY_CARE_PROVIDER_SITE_OTHER): Payer: Medicare Other | Admitting: Gastroenterology

## 2017-08-13 VITALS — BP 116/71 | HR 70 | Temp 97.8°F | Ht 62.0 in | Wt 264.4 lb

## 2017-08-13 DIAGNOSIS — R1011 Right upper quadrant pain: Secondary | ICD-10-CM

## 2017-08-13 NOTE — Patient Instructions (Signed)
I am glad you are doing better!  You may take Miralax daily as needed. You can take it twice a day if you start getting constipated.   I do recommend seeing a Urologist, if  Jeannetta Nap thinks this is appropriate as well.   I will see you in 3 months!

## 2017-08-13 NOTE — Progress Notes (Signed)
Referring Provider: Denny Levy, [place] Primary Care Physician:  Denny Levy, [place]  Primary GI: Dr. Gala Romney   Chief Complaint  Patient presents with  . Abdominal Pain    HPI:   [name] is a 76 y.o. female presenting today with a history of  chronic RUQ pain, thoroughly evaluated here and seen by Dr. Roney Mans at [facility], who felt distension of hepatic capsule associated with hepatic steatosis was the culprit, recommending low-fat diet, weight reduction, and trial of Vit E 800 units daily.   Her weight is down to 264 from 269 in March. Her max weight was 311 in Jan 2017. It was felt that this was multifactorial in setting of grief (losing husband) and medical issues.   Has Felt woozy, head feels noisy like railroad car wheels in her head. PCP thinks she may have Meniere's disease. Ativan will be slowly increased and will be having therapy.   Drinks peppermint tea once a day, which helps with RUQ pain. After eating dinner, will sometimes drink another cup of tea. Beets help as well. Gets bloated at times, which peppermint tea helps.   Has pain in lower abdomen and was treated for a bladder infection a few times. Feels bloated. Worried about ovarian cancer. Has no sensation of needing to urinate. CT on file from March 2018 without acute abnormality.   Has a lot of itching and yeast in vaginal area. Uses creams. Notes she has a lump at vagina.   Since colonoscopy, has been constipated for awhile. Uses Miralax and colace, which helps for the most part. However, sometimes she will not go for a few days and will have a Bristol stool scale that is hard and painful to go. Declining any additional agents.   Past Medical History:  Diagnosis Date  . Allergic rhinitis   . Anxiety   . Asthma    pt states she "believe it is more allergies"  . Cholecystitis   . Chronic abdominal pain   . Colon polyp    TCS 2005 TICS [place], TCS/PROPOFOL 2011 SIMPLE [place]ENOMAS  . Depression   . Diverticulosis 2005    . Fatty liver   . Fibromyalgia   . GERD (gastroesophageal reflux disease)   . Gout   . Hyperlipemia   . Hypothyroidism   . Internal hemorrhoids    H/o Rectal bleeding secondary to internal hemorrhoids 04/2010.  . Migraine   . Narcolepsy   . Obesity, morbid (more than 100 lbs over ideal weight or BMI > 40) (HCC)   . Obstructive sleep apnea on CPAP and O2  . Osteoarthritis   . Persistent atrial fibrillation ([place])    a. s/p DCCV 01/2012  . Volume overload    uses torsemide PRN    Past Surgical History:  Procedure Laterality Date  . ABDOMINAL HYSTERECTOMY  FIBROIDS 1982  . BLADDER SUSPENSION  1982  . CARDIOVERSION  02/05/2012   Procedure: CARDIOVERSION;  Surgeon: Laverda Page, MD;  Location: [facility];  Service: Cardiovascular;  Laterality: N/A;  . CARDIOVERSION N/A 06/09/2013   Procedure: CARDIOVERSION;  Surgeon: Laverda Page, MD;  Location: [facility] ENDOSCOPY;  Service: Cardiovascular;  Laterality: N/A;  h&p in file-[facility]  . CARDIOVERSION N/A 08/09/2015   Procedure: CARDIOVERSION;  Surgeon: Adrian Prows, MD;  Location: [facility] ENDOSCOPY;  Service: Cardiovascular;  Laterality: N/A;  . CARDIOVERSION N/A 09/13/2015   Procedure: CARDIOVERSION;  Surgeon: Adrian Prows, MD;  Location: [facility];  Service: Cardiovascular;  Laterality: N/A;  . CHOLECYSTECTOMY  09/03/2012   Procedure: LAPAROSCOPIC CHOLECYSTECTOMY;  Surgeon: Jamesetta So, MD;  [facility];  Service: General;  Laterality: N/A;  . COLONOSCOPY  2005   Dr. Tamala Julian: sigmoid diverticulosis  . COLONOSCOPY  June 2011   Dr. Oneida Alar: internal hemorrhoids, simple adenomas, hyperplastic polyps, needs surveillance in June 2014 with overtube and Propofol  . COLONOSCOPY WITH PROPOFOL N/A 03/19/2016   Dr. Gala Romney: multiple small tubular adenomas, diverticulosis. Surveillance in 5 years   . ERCP  10/01/2012   Procedure: ENDOSCOPIC RETROGRADE CHOLANGIOPANCREATOGRAPHY (ERCP);  Surgeon: Daneil Dolin, MD;  Location: [facility];  Service: Endoscopy;   Laterality: N/A;  . ESOPHAGOGASTRODUODENOSCOPY (EGD) WITH PROPOFOL N/A 03/19/2016   DR. Rourk: normal esohpagus with small hiatal hernia   . Kidney Stent  1995  . POLYPECTOMY  03/19/2016   Procedure: POLYPECTOMY;  Surgeon: Daneil Dolin, MD;  [facility];  Service: Endoscopy;;  cold snares  . REMOVAL OF STONES  10/01/2012   Procedure: REMOVAL OF STONES;  Surgeon: Daneil Dolin, MD;  Location[facility];  Service: Endoscopy;  Laterality: N/A;  . RHINOPLASTY    . SPHINCTEROTOMY  10/01/2012   Procedure: SPHINCTEROTOMY;  Surgeon: Daneil Dolin, MD;  Location[facility];  Service: Endoscopy;  Laterality: N/A;    Current Outpatient Prescriptions  Medication Sig Dispense Refill  . albuterol (PROVENTIL,VENTOLIN) 90 MCG/ACT inhaler Inhale 2 puffs into the lungs every 6 (six) hours as needed for wheezing or shortness of breath.     Marland Kitchen apixaban (ELIQUIS) 5 MG TABS tablet Take 5 mg by mouth 2 (two) times daily.    . Cholecalciferol (VITAMIN D3) 5000 units CAPS Take 1 capsule by mouth daily.    Marland Kitchen esomeprazole (NEXIUM) 40 MG capsule Take 40 mg by mouth every evening.     . fluticasone (FLONASE) 50 MCG/ACT nasal spray Place 2 sprays into both nostrils every evening.     Marland Kitchen levothyroxine (SYNTHROID, LEVOTHROID) 50 MCG tablet Take 50 mcg by mouth daily before breakfast.    . LORazepam (ATIVAN) 0.5 MG tablet Take 0.5 mg by mouth 3 (three) times daily.    . propranolol (INNOPRAN XL) 80 MG 24 hr capsule Take 80 mg by mouth daily.    Marland Kitchen allopurinol (ZYLOPRIM) 100 MG tablet Take 100 mg by mouth daily.    . hydrocortisone cream 1 % Apply 1 application topically daily as needed for itching.    . miconazole (MICOTIN) 2 % cream Apply 1 application topically daily as needed (yeast).    . Misc Natural Products (TART CHERRY ADVANCED PO) Take 120 mLs by mouth daily.      No current facility-administered medications for this visit.     Allergies as of 08/13/2017 - Review Complete 08/13/2017  Allergen Reaction  Noted  . Demerol [meperidine] Rash and Other (See Comments) 09/30/2012  . Atorvastatin Other (See Comments) 11/30/2006  . Uloric [febuxostat] Other (See Comments) 02/14/2017  . Zocor [simvastatin - high dose] Other (See Comments) 01/25/2012    Family History  Problem Relation Age of Onset  . Colon cancer Mother 81  . Diabetes Mother   . Asthma Mother   . Heart disease Mother   . Cerebral aneurysm Father   . Asthma Maternal Aunt   . Asthma Brother   . Heart disease Maternal Grandfather   . Heart disease Maternal Grandmother     Social History   Social History  . Marital status: Widowed    Spouse name: N/A  . Number of children:  N/A  . Years of education: N/A   Occupational History  . Homemaker    Social History Main Topics  . Smoking status: Former Smoker    Packs/day: 0.50    Years: 30.00    Quit date: 10/03/1989  . Smokeless tobacco: Never Used     Comment: Quit in 03/22/1991  . Alcohol use No  . Drug use: No  . Sexual activity: No   Other Topics Concern  . None   Social History Narrative   Widowed. Husband passed away 2016/03/21         Review of Systems: As mentioned in HPI   Physical Exam: BP 116/71   Pulse 70   Temp 97.8 F (36.6 C) (Oral)   Ht 5\' 2"  (1.575 m)   Wt 264 lb 6.4 oz (119.9 kg)   BMI 48.36 kg/m  General:   Alert and oriented. No distress noted. Pleasant and cooperative.  Head:  Normocephalic and atraumatic. Eyes:  Conjuctiva clear without scleral icterus. Mouth:  Oral mucosa pink and moist. Good dentition. No lesions. Abdomen:  +BS, soft, obese with large AP diameter. non-tender and non-distended. No rebound or guarding. No HSM or masses noted. Msk:  Symmetrical without gross deformities. Normal posture. Extremities:  Without edema. Neurologic:  Alert and  oriented x4 Psych:  Alert and cooperative. Normal mood and affect.  Lab Results  Component Value Date   ALT 20 02/14/2017   AST 20 02/14/2017   ALKPHOS 81 02/14/2017   BILITOT 0.5  02/14/2017   Lab Results  Component Value Date   WBC 5.8 07/18/2017   HGB 13.8 07/18/2017   HCT 42.6 07/18/2017   MCV 89.9 07/18/2017   PLT 230 07/18/2017   Lab Results  Component Value Date   CREATININE 0.73 07/18/2017   BUN 16 07/18/2017   NA 142 07/18/2017   K 4.0 07/18/2017   CL 106 07/18/2017   CO2 28 07/18/2017

## 2017-08-16 NOTE — Assessment & Plan Note (Signed)
Chronic RUQ abdominal pain that has improved with dietary measures. She has had extensive evaluation to include imaging, EGD, evaluation at [facility]. No alarm symptoms. Weight loss has been multifactorial and correlated with loss of husband. She seems to be nearing a plateau. Multiple non-GI concerns today, and she was recommended to follow closely with PCP. I highly recommend urology evaluation, if PCP feels appropriate. Will use Miralax prn for constipation; she is declining prescriptive agents. Return in 3 months.

## 2017-08-19 ENCOUNTER — Telehealth: Payer: Self-pay | Admitting: Gastroenterology

## 2017-08-19 ENCOUNTER — Encounter: Payer: Self-pay | Admitting: Gastroenterology

## 2017-08-19 NOTE — Telephone Encounter (Signed)
PATIENT NEEDS LETTER OF MEDICAL NECESSITY SENT TO HER PHARMACY SO HER SSI WILL PAY FOR PROBIOTICS [phone]

## 2017-08-19 NOTE — Telephone Encounter (Signed)
Done

## 2017-08-19 NOTE — Progress Notes (Signed)
CC'D TO PCP °

## 2017-08-19 NOTE — Telephone Encounter (Signed)
Forwarding to Roseanne Kaufman, NP who saw pt on 08/13/2017.

## 2017-08-20 NOTE — Telephone Encounter (Signed)
Pt said she uses [employer] but she is not aware of this letter of necessity. I told her I do not know who called, but I am faxing it to [facility].  She said that is fine.

## 2017-09-18 DIAGNOSIS — I482 Chronic atrial fibrillation: Secondary | ICD-10-CM | POA: Diagnosis not present

## 2017-09-18 DIAGNOSIS — R0609 Other forms of dyspnea: Secondary | ICD-10-CM | POA: Diagnosis not present

## 2017-09-18 DIAGNOSIS — R0789 Other chest pain: Secondary | ICD-10-CM | POA: Diagnosis not present

## 2017-09-18 DIAGNOSIS — R0989 Other specified symptoms and signs involving the circulatory and respiratory systems: Secondary | ICD-10-CM | POA: Diagnosis not present

## 2017-09-23 DIAGNOSIS — G4733 Obstructive sleep apnea (adult) (pediatric): Secondary | ICD-10-CM | POA: Diagnosis not present

## 2017-09-23 DIAGNOSIS — J449 Chronic obstructive pulmonary disease, unspecified: Secondary | ICD-10-CM | POA: Diagnosis not present

## 2017-09-23 DIAGNOSIS — I481 Persistent atrial fibrillation: Secondary | ICD-10-CM | POA: Diagnosis not present

## 2017-09-23 DIAGNOSIS — Z9181 History of falling: Secondary | ICD-10-CM | POA: Diagnosis not present

## 2017-10-15 DIAGNOSIS — I517 Cardiomegaly: Secondary | ICD-10-CM | POA: Diagnosis not present

## 2017-10-15 DIAGNOSIS — I499 Cardiac arrhythmia, unspecified: Secondary | ICD-10-CM | POA: Diagnosis not present

## 2017-10-15 DIAGNOSIS — I481 Persistent atrial fibrillation: Secondary | ICD-10-CM | POA: Diagnosis not present

## 2017-10-15 DIAGNOSIS — Z79899 Other long term (current) drug therapy: Secondary | ICD-10-CM | POA: Diagnosis not present

## 2017-10-15 DIAGNOSIS — I4891 Unspecified atrial fibrillation: Secondary | ICD-10-CM | POA: Diagnosis not present

## 2017-10-29 DIAGNOSIS — I480 Paroxysmal atrial fibrillation: Secondary | ICD-10-CM | POA: Diagnosis not present

## 2017-10-29 DIAGNOSIS — L409 Psoriasis, unspecified: Secondary | ICD-10-CM | POA: Diagnosis not present

## 2017-10-29 DIAGNOSIS — Z6841 Body Mass Index (BMI) 40.0 and over, adult: Secondary | ICD-10-CM | POA: Diagnosis not present

## 2017-10-29 DIAGNOSIS — H8103 Meniere's disease, bilateral: Secondary | ICD-10-CM | POA: Diagnosis not present

## 2017-10-29 DIAGNOSIS — F330 Major depressive disorder, recurrent, mild: Secondary | ICD-10-CM | POA: Diagnosis not present

## 2017-11-04 DIAGNOSIS — I4891 Unspecified atrial fibrillation: Secondary | ICD-10-CM | POA: Diagnosis not present

## 2017-11-12 ENCOUNTER — Telehealth: Payer: Self-pay | Admitting: *Deleted

## 2017-11-12 ENCOUNTER — Encounter: Payer: Self-pay | Admitting: Gastroenterology

## 2017-11-12 ENCOUNTER — Ambulatory Visit (INDEPENDENT_AMBULATORY_CARE_PROVIDER_SITE_OTHER): Payer: Medicare Other | Admitting: Gastroenterology

## 2017-11-12 VITALS — BP 150/78 | HR 74 | Temp 97.0°F | Ht 62.0 in | Wt 263.8 lb

## 2017-11-12 DIAGNOSIS — I4891 Unspecified atrial fibrillation: Secondary | ICD-10-CM | POA: Diagnosis not present

## 2017-11-12 DIAGNOSIS — R1011 Right upper quadrant pain: Secondary | ICD-10-CM

## 2017-11-12 NOTE — Telephone Encounter (Signed)
Called [facility] and was advised pt will need to call them for an appointment. Patient was provided with the phone # to call. [name] made aware.

## 2017-11-12 NOTE — Patient Instructions (Signed)
I will see you in 6 months.  I am referring you to Dr. Rica Mote in [place].   I hope you have a wonderful Christmas!

## 2017-11-12 NOTE — Progress Notes (Signed)
Referring Provider: Denny Levy, [place] Primary Care Physician:  Denny Levy, [place] Primary GI: Dr. Gala Romney   Chief Complaint  Patient presents with  . Abdominal Pain    ruq    HPI:   [name] is a 76 y.o. female presenting today with a history of of chronic RUQ pain, thoroughly evaluated here and seen by Dr. Roney Mans at [facility], who felt distension of hepatic capsule associated with hepatic steatosis was the culprit, recommending low-fat diet, weight reduction, and trial of Vit E 800 units daily.   Added centrum silver multivitamin. Has to press her hand in RUQ and "hold it" when laying down. Pain will shoot across her back. Weight is stable from Sept 2018. Feels down. Still dealing with loss of husband. Misses the psychologist that she used to see, who no longer practices at that location. Interested in speaking to a psychologist again. Feels this may help with her overall outlook.  Past Medical History:  Diagnosis Date  . Allergic rhinitis   . Anxiety   . Asthma    pt states she "believe it is more allergies"  . Cholecystitis   . Chronic abdominal pain   . Colon polyp    TCS 2005 TICS [place], TCS/PROPOFOL 2011 SIMPLE [place]AS  . Depression   . Diverticulosis 2005  . Fatty liver   . Fibromyalgia   . GERD (gastroesophageal reflux disease)   . Gout   . Hyperlipemia   . Hypothyroidism   . Internal hemorrhoids    H/o Rectal bleeding secondary to internal hemorrhoids 04/2010.  . Migraine   . Narcolepsy   . Obesity, morbid (more than 100 lbs over ideal weight or BMI > 40) (HCC)   . Obstructive sleep apnea on CPAP and O2  . Osteoarthritis   . Persistent atrial fibrillation ([place])    a. s/p DCCV 01/2012  . Volume overload    uses torsemide PRN    Past Surgical History:  Procedure Laterality Date  . ABDOMINAL HYSTERECTOMY  FIBROIDS 1982  . BLADDER SUSPENSION  1982  . CARDIOVERSION  02/05/2012   Procedure: CARDIOVERSION;  Surgeon: Laverda Page, MD;  Location: [place];   Service: Cardiovascular;  Laterality: N/A;  . CARDIOVERSION N/A 06/09/2013   Procedure: CARDIOVERSION;  Surgeon: Laverda Page, MD;  Location: [facility] ENDOSCOPY;  Service: Cardiovascular;  Laterality: N/A;  h&p in file-[facility]  . CARDIOVERSION N/A 08/09/2015   Procedure: CARDIOVERSION;  Surgeon: Adrian Prows, MD;  Location: [facility] ENDOSCOPY;  Service: Cardiovascular;  Laterality: N/A;  . CARDIOVERSION N/A 09/13/2015   Procedure: CARDIOVERSION;  Surgeon: Adrian Prows, MD;  Location: [facility] ENDOSCOPY;  Service: Cardiovascular;  Laterality: N/A;  . CHOLECYSTECTOMY  09/03/2012   Procedure: LAPAROSCOPIC CHOLECYSTECTOMY;  Surgeon: Jamesetta So, MD;  [facility];  Service: General;  Laterality: N/A;  . COLONOSCOPY  2005   Dr. Tamala Julian: sigmoid diverticulosis  . COLONOSCOPY  June 2011   Dr. Oneida Alar: internal hemorrhoids, simple adenomas, hyperplastic polyps, needs surveillance in June 2014 with overtube and Propofol  . COLONOSCOPY WITH PROPOFOL N/A 03/19/2016   Dr. Gala Romney: multiple small tubular adenomas, diverticulosis. Surveillance in 5 years   . ERCP  10/01/2012   Procedure: ENDOSCOPIC RETROGRADE CHOLANGIOPANCREATOGRAPHY (ERCP);  Surgeon: Daneil Dolin, MD;  Location[facility];  Service: Endoscopy;  Laterality: N/A;  . ESOPHAGOGASTRODUODENOSCOPY (EGD) WITH PROPOFOL N/A 03/19/2016   DR. Rourk: normal esohpagus with small hiatal hernia   . Kidney Stent  1995  . POLYPECTOMY  03/19/2016   Procedure:  POLYPECTOMY;  Surgeon: Daneil Dolin, MD;  [facility];  Service: Endoscopy;;  cold snares  . REMOVAL OF STONES  10/01/2012   Procedure: REMOVAL OF STONES;  Surgeon: Daneil Dolin, MD;  [facility];  Service: Endoscopy;  Laterality: N/A;  . RHINOPLASTY    . SPHINCTEROTOMY  10/01/2012   Procedure: SPHINCTEROTOMY;  Surgeon: Daneil Dolin, MD;  Location: [facility];  Service: Endoscopy;  Laterality: N/A;    Current Outpatient Medications  Medication Sig Dispense Refill  . albuterol (PROVENTIL,VENTOLIN) 90  MCG/ACT inhaler Inhale 2 puffs into the lungs every 6 (six) hours as needed for wheezing or shortness of breath.     . allopurinol (ZYLOPRIM) 100 MG tablet Take 1 tablet by mouth daily.    Marland Kitchen apixaban (ELIQUIS) 5 MG TABS tablet Take 5 mg by mouth 2 (two) times daily.    . Cholecalciferol (VITAMIN D3) 5000 units CAPS Take 1 capsule by mouth daily.    Marland Kitchen esomeprazole (NEXIUM) 40 MG capsule Take 40 mg by mouth every evening.     . fluticasone (FLONASE) 50 MCG/ACT nasal spray Place 2 sprays into both nostrils as needed for allergies or rhinitis.     Marland Kitchen levothyroxine (SYNTHROID, LEVOTHROID) 50 MCG tablet Take 50 mcg by mouth daily before breakfast.    . LORazepam (ATIVAN) 0.5 MG tablet Take 1.5 mg by mouth 3 (three) times daily.     . Multiple Vitamin (MULTIVITAMIN) tablet Take 1 tablet by mouth daily.    . propranolol (INNOPRAN XL) 80 MG 24 hr capsule Take 80 mg by mouth daily.    . vitamin E 100 UNIT capsule Take by mouth daily. Pt unsure of dosage     No current facility-administered medications for this visit.     Allergies as of 11/12/2017 - Review Complete 11/12/2017  Allergen Reaction Noted  . Demerol [meperidine] Rash and Other (See Comments) 09/30/2012  . Atorvastatin Other (See Comments) 11/30/2006  . Uloric [febuxostat] Other (See Comments) 02/14/2017  . Zocor [simvastatin - high dose] Other (See Comments) 01/25/2012    Family History  Problem Relation Age of Onset  . Colon cancer Mother 33  . Diabetes Mother   . Asthma Mother   . Heart disease Mother   . Cerebral aneurysm Father   . Asthma Maternal Aunt   . Asthma Brother   . Heart disease Maternal Grandfather   . Heart disease Maternal Grandmother     Social History   Socioeconomic History  . Marital status: Widowed    Spouse name: None  . Number of children: None  . Years of education: None  . Highest education level: None  Social Needs  . Financial resource strain: None  . Food insecurity - worry: None  . Food  insecurity - inability: None  . Transportation needs - medical: None  . Transportation needs - non-medical: None  Occupational History  . Occupation: Homemaker  Tobacco Use  . Smoking status: Former Smoker    Packs/day: 0.50    Years: 30.00    Pack years: 15.00    Last attempt to quit: 10/03/1989    Years since quitting: 28.1  . Smokeless tobacco: Never Used  . Tobacco comment: Quit in 1992  Substance and Sexual Activity  . Alcohol use: No    Alcohol/week: 0.0 oz  . Drug use: No  . Sexual activity: No    Birth control/protection: Surgical  Other Topics Concern  . None  Social History Narrative   Widowed.  Husband passed away 2017         Review of Systems: Gen: Denies fever, chills, anorexia. Denies fatigue, weakness, weight loss.  CV: Denies chest pain, palpitations, syncope, peripheral edema, and claudication. Resp: Denies dyspnea at rest, cough, wheezing, coughing up blood, and pleurisy. GI: see HPI  Derm: Denies rash, itching, dry skin Psych: see HPI. Denies suicidal or homicidal ideation  Heme: Denies bruising, bleeding, and enlarged lymph nodes.  Physical Exam: BP (!) 150/78   Pulse 74   Temp (!) 97 F (36.1 C) (Oral)   Ht 5\' 2"  (1.575 m)   Wt 263 lb 12.8 oz (119.7 kg)   BMI 48.25 kg/m  General:   Alert and oriented. No distress noted. Pleasant and cooperative.  Head:  Normocephalic and atraumatic. Eyes:  Conjuctiva clear without scleral icterus. Mouth:  Oral mucosa pink and moist.  Abdomen:  +BS, soft, obese, mild TTP RUQ and non-distended. No rebound or guarding. No HSM or masses noted. Msk:  Symmetrical without gross deformities. Normal posture. Extremities:  Without edema. Neurologic:  Alert and  oriented x4 Psych:  Alert and cooperative. Normal mood and affect.

## 2017-11-15 NOTE — Assessment & Plan Note (Signed)
76 year old female with chronic RUQ pain, thoroughly evaluated both here and at [facility]. At baseline for patient. Dealing with depression, previously established with psychology but hasn't been in some time. Recommend referral to [facility] in [place]. We attempted to refer, but the practice requests self-referral. Patient aware and will call. Return in 6 months.

## 2017-11-18 NOTE — Progress Notes (Signed)
CC'D TO PCP °

## 2017-12-25 DIAGNOSIS — R030 Elevated blood-pressure reading, without diagnosis of hypertension: Secondary | ICD-10-CM | POA: Diagnosis not present

## 2017-12-25 DIAGNOSIS — G4733 Obstructive sleep apnea (adult) (pediatric): Secondary | ICD-10-CM | POA: Diagnosis not present

## 2017-12-25 DIAGNOSIS — I481 Persistent atrial fibrillation: Secondary | ICD-10-CM | POA: Diagnosis not present

## 2017-12-25 DIAGNOSIS — Z9181 History of falling: Secondary | ICD-10-CM | POA: Diagnosis not present

## 2017-12-30 ENCOUNTER — Encounter: Payer: Self-pay | Admitting: Neurology

## 2017-12-30 ENCOUNTER — Encounter (INDEPENDENT_AMBULATORY_CARE_PROVIDER_SITE_OTHER): Payer: Self-pay

## 2017-12-30 ENCOUNTER — Ambulatory Visit (INDEPENDENT_AMBULATORY_CARE_PROVIDER_SITE_OTHER): Payer: Medicare Other | Admitting: Neurology

## 2017-12-30 VITALS — BP 114/65 | HR 65 | Ht 62.0 in | Wt 263.5 lb

## 2017-12-30 DIAGNOSIS — E538 Deficiency of other specified B group vitamins: Secondary | ICD-10-CM

## 2017-12-30 DIAGNOSIS — G3184 Mild cognitive impairment, so stated: Secondary | ICD-10-CM

## 2017-12-30 DIAGNOSIS — H9313 Tinnitus, bilateral: Secondary | ICD-10-CM

## 2017-12-30 HISTORY — DX: Mild cognitive impairment of uncertain or unknown etiology: G31.84

## 2017-12-30 NOTE — Progress Notes (Signed)
Reason for visit: Bilateral tinnitus, memory disorder  Referring physician: Dr. Jac Canavan is a 77 y.o. female  History of present illness:  [name] is a 77 year old right-handed white female with a history of morbid obesity.  The patient gives a prior history of sleep apnea on CPAP, she also has a history of fibromyalgia.  She comes in today mainly complaining of alterations in hearing that began 3 years ago.  The patient has been seen by at least 2 different ENT physicians, she has been to several audiologists.  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  Three years ago she had a sudden change in hearing in the left ear that gradually improved, but the hearing has changed since that time.  She is having difficulty understanding what people are saying to her, she is having a high-pitched noises in the ears and some roaring sounds as well as times hearing the sound of dogs barking or other sudden loud noises.  The patient has been tried different hearing aids without benefit.  The patient also complains of a cloudy headed sensation, not true dizziness.  She denies the sensation of dizziness or vertigo.  The patient is also had some mild forgetfulness.  The cloudy headed sensations and forgetfulness have been there about 1 year.  In August 2018 she had a MRI of the brain that shows very minimal white matter changes.  No acute changes were seen.  In the past, she has had migraine headaches but she has not had any headaches for several years.  Just within the last week she has had bifrontal headaches on 4 occasions lasting up to 3-4 hours.  The headaches may be associated with sharp pain unassociated with photophobia or phonophobia or nausea or vomiting.  The patient reports some troubles with anxiety associated with confusion.  She denies any significant balance issues, she has not had any falls.  She does have incontinence of the bladder.  She is sent to this office for  further evaluation.  Past Medical History:  Diagnosis Date  . Allergic rhinitis   . Anxiety   . Asthma    pt states she "believe it is more allergies"  . Cholecystitis   . Chronic abdominal pain   . Colon polyp    TCS 2005 TICS [place], TCS/PROPOFOL 2011 SIMPLE [place]  . Depression   . Diverticulosis 2005  . Fatty liver   . Fibromyalgia   . GERD (gastroesophageal reflux disease)   . Gout   . Hyperlipemia   . Hypothyroidism   . Internal hemorrhoids    H/o Rectal bleeding secondary to internal hemorrhoids 04/2010.  Marland Kitchen MCI (mild cognitive impairment) 12/30/2017  . Migraine   . Narcolepsy   . Obesity, morbid (more than 100 lbs over ideal weight or BMI > 40) (HCC)   . Obstructive sleep apnea on CPAP and O2  . Osteoarthritis   . Persistent atrial fibrillation ([place])    a. s/p DCCV 01/2012  . Volume overload    uses torsemide PRN    Past Surgical History:  Procedure Laterality Date  . ABDOMINAL HYSTERECTOMY  FIBROIDS 1982  . BLADDER SUSPENSION  1982  . CARDIOVERSION  02/05/2012   Procedure: CARDIOVERSION;  Surgeon: Laverda Page, MD;  Location: [place];  Service: Cardiovascular;  Laterality: N/A;  . CARDIOVERSION N/A 06/09/2013   Procedure: CARDIOVERSION;  Surgeon: Laverda Page, MD;  Location: [place];  Service: Cardiovascular;  Laterality: N/A;  h&p in file-[facility]  . CARDIOVERSION N/A 08/09/2015   Procedure: CARDIOVERSION;  Surgeon: Adrian Prows, MD;  Location: [facility] ENDOSCOPY;  Service: Cardiovascular;  Laterality: N/A;  . CARDIOVERSION N/A 09/13/2015   Procedure: CARDIOVERSION;  Surgeon: Adrian Prows, MD;  Location: [facility] ENDOSCOPY;  Service: Cardiovascular;  Laterality: N/A;  . CHOLECYSTECTOMY  09/03/2012   Procedure: LAPAROSCOPIC CHOLECYSTECTOMY;  Surgeon: Jamesetta So, MD;  [facility];  Service: General;  Laterality: N/A;  . COLONOSCOPY  2005   Dr. Tamala Julian: sigmoid diverticulosis  . COLONOSCOPY  June 2011   Dr. Oneida Alar: internal hemorrhoids, simple adenomas, hyperplastic  polyps, needs surveillance in June 2014 with overtube and Propofol  . COLONOSCOPY WITH PROPOFOL N/A 03/19/2016   Dr. Gala Romney: multiple small tubular adenomas, diverticulosis. Surveillance in 5 years   . ERCP  10/01/2012   Procedure: ENDOSCOPIC RETROGRADE CHOLANGIOPANCREATOGRAPHY (ERCP);  Surgeon: Daneil Dolin, MD;  [facility];  Service: Endoscopy;  Laterality: N/A;  . ESOPHAGOGASTRODUODENOSCOPY (EGD) WITH PROPOFOL N/A 03/19/2016   DR. Rourk: normal esohpagus with small hiatal hernia   . Kidney Stent  1995  . POLYPECTOMY  03/19/2016   Procedure: POLYPECTOMY;  Surgeon: Daneil Dolin, MD;  [facility];  Service: Endoscopy;;  cold snares  . REMOVAL OF STONES  10/01/2012   Procedure: REMOVAL OF STONES;  Surgeon: Daneil Dolin, MD;  [facility];  Service: Endoscopy;  Laterality: N/A;  . RHINOPLASTY    . SPHINCTEROTOMY  10/01/2012   Procedure: SPHINCTEROTOMY;  Surgeon: Daneil Dolin, MD;  [facility];  Service: Endoscopy;  Laterality: N/A;    Family History  Problem Relation Age of Onset  . Colon cancer Mother 62  . Diabetes Mother   . Asthma Mother   . Heart disease Mother   . Cerebral aneurysm Father   . Asthma Maternal Aunt   . Asthma Brother   . Heart disease Maternal Grandfather   . Heart disease Maternal Grandmother     Social history:  reports that she quit smoking about 28 years ago. She has a 15.00 pack-year smoking history. she has never used smokeless tobacco. She reports that she does not drink alcohol or use drugs.  Medications:  Prior to Admission medications   Medication Sig Start Date End Date Taking? Authorizing Provider  albuterol (PROVENTIL,VENTOLIN) 90 MCG/ACT inhaler Inhale 2 puffs into the lungs every 6 (six) hours as needed for wheezing or shortness of breath.     [provider]  allopurinol (ZYLOPRIM) 100 MG tablet Take 1 tablet by mouth daily. 10/17/17   [provider]  apixaban (ELIQUIS) 5 MG TABS tablet Take 5 mg by  mouth 2 (two) times daily.    [provider]  Cholecalciferol (VITAMIN D3) 5000 units CAPS Take 1 capsule by mouth daily.    [provider]  esomeprazole (NEXIUM) 40 MG capsule Take 40 mg by mouth every evening.     [provider]  fluticasone (FLONASE) 50 MCG/ACT nasal spray Place 2 sprays into both nostrils as needed for allergies or rhinitis.     [provider]  levothyroxine (SYNTHROID, LEVOTHROID) 50 MCG tablet Take 50 mcg by mouth daily before breakfast.    [provider]  LORazepam (ATIVAN) 0.5 MG tablet Take 1.5 mg by mouth 3 (three) times daily.     [provider]  Multiple Vitamin (MULTIVITAMIN) tablet Take 1 tablet by mouth daily.    [provider]  propranolol (INNOPRAN XL) 80 MG 24 hr capsule  Take 80 mg by mouth daily.    [provider]  vitamin E 100 UNIT capsule Take by mouth daily. Pt unsure of dosage    [provider]      Allergies  Allergen Reactions  . Demerol [Meperidine] Rash and Other (See Comments)    Reaction:  Bottoms out BP  . Atorvastatin Other (See Comments)    Reaction:  Muscle pain   . Uloric [Febuxostat] Other (See Comments)    Reaction:  Visual changes   . Zocor [Simvastatin - High Dose] Other (See Comments)    Reaction:  Elevated liver enzymes    ROS:  Out of a complete 14 system review of symptoms, the patient complains only of the following symptoms, and all other reviewed systems are negative.  Chills, fatigue Palpitations of the heart, atrial fibrillation, chest pain Hearing loss, ringing in the ears Skin rash, moles, itching Blurred vision Shortness of breath, cough Incontinence of the bowels and bladder Feeling hot, cold Joint pain, aching muscles Allergies, runny nose Memory loss, confusion, headache, weakness Depression, anxiety, decreased energy, change in appetite, disinterest in activities  Blood pressure 114/65, pulse 65, height 5\' 2"  (1.575  m), weight 263 lb 8 oz (119.5 kg).  Physical Exam  General: The patient is alert and cooperative at the time of the examination.  The patient is markedly obese.  Eyes: Pupils are equal, round, and reactive to light. Discs are flat bilaterally.  Ears: Tympanic membranes are clear bilaterally.  Neck: The neck is supple, no carotid bruits are noted.  Respiratory: The respiratory examination is clear.  Cardiovascular: The cardiovascular examination reveals a regular rate and rhythm, no obvious murmurs or rubs are noted.  Skin: Extremities are without significant edema.  Neurologic Exam  Mental status: The patient is alert and oriented x 3 at the time of the examination. The patient has apparent normal recent and remote memory, with an apparently normal attention span and concentration ability.  Mini-Mental status examination done today shows a total score 29/30.  Cranial nerves: Facial symmetry is present. There is good sensation of the face to pinprick and soft touch bilaterally. The strength of the facial muscles and the muscles to head turning and shoulder shrug are normal bilaterally. Speech is well enunciated, no aphasia or dysarthria is noted. Extraocular movements are full. Visual fields are full. The tongue is midline, and the patient has symmetric elevation of the soft palate. No obvious hearing deficits are noted.  Motor: The motor testing reveals 5 over 5 strength of all 4 extremities. Good symmetric motor tone is noted throughout.  Sensory: Sensory testing is intact to pinprick, soft touch, vibration sensation, and position sense on all 4 extremities. No evidence of extinction is noted.  Coordination: Cerebellar testing reveals good finger-nose-finger and heel-to-shin bilaterally.  Gait and station: Gait is normal. Tandem gait is unsteady. Romberg is negative. No drift is seen.  Reflexes: Deep tendon reflexes are symmetric, but are depressed bilaterally. Toes are downgoing  bilaterally.   MRI brain 07/18/17:  IMPRESSION: 1. No acute intracranial abnormality and largely unremarkable for age noncontrast MRI appearance of the brain; mild for age nonspecific signal changes in the pons. 2. Mild to moderate paranasal sinus inflammation.  * MRI scan images were reviewed online. I agree with the written report.    Assessment/Plan:  1.  Bilateral tinnitus  2.  History of fibromyalgia  3.  Mild forgetfulness  4.  Headache  The patient reports some hearing changes, she is bothered  by bilateral tinnitus, I am not sure that this is treatable.  The patient also reports some mild forgetfulness and a cloudy headed sensation, she denies true dizziness or vertigo.  Within the last week she has started to have headache.  Blood work will be done today, if the headache persists, the patient is to contact our office and we will initiate medication for the headache and consider a repeat scan of the brain.  The patient will be followed up in 6 months to evaluate the memory issues.  Jill Alexanders MD 12/30/2017 2:49 PM  [facility] [address] [place], [place] [address]  Phone [phone] Fax [phone]

## 2017-12-31 ENCOUNTER — Telehealth: Payer: Self-pay | Admitting: *Deleted

## 2017-12-31 LAB — VITAMIN B12: Vitamin B-12: 384 pg/mL (ref 232–1245)

## 2017-12-31 LAB — RPR: RPR Ser Ql: NONREACTIVE

## 2017-12-31 LAB — SEDIMENTATION RATE: Sed Rate: 5 mm/hr (ref 0–40)

## 2017-12-31 NOTE — Telephone Encounter (Signed)
-----   Message from Kathrynn Ducking, MD sent at 12/31/2017  7:24 AM EST -----   The blood work results are unremarkable. Please call the patient.  ----- Message ----- From: Lavone Neri Lab Results In Sent: 12/31/2017   5:41 AM To: Kathrynn Ducking, MD

## 2017-12-31 NOTE — Telephone Encounter (Signed)
Called and spoke with patient about unremarkable labs per CW,MD note. Pt verbalized understanding.  

## 2018-01-04 IMAGING — MR MR ABDOMEN WO/W CM MRCP
18 of 19 series · 44 of 48 positions shown · IV contrast (Multihance 20 ML)
Comparison: Go

CLINICAL DATA: RIGHT upper quadrant pain worsening when she eats.
Gray colored stools.

EXAM:
MRI ABDOMEN WITHOUT AND WITH CONTRAST (INCLUDING MRCP)
TECHNIQUE: Multiplanar multisequence MR imaging of the abdomen was performed
both before and after the administration of intravenous contrast.
Heavily T2-weighted images of the biliary and pancreatic ducts were
obtained, and three-dimensional MRCP images were rendered by post
processing.
CONTRAST:  20mL MULTIHANCE GADOBENATE DIMEGLUMINE 529 MG/ML IV SOLN

[Series 3: T2 · coronal · 5.0mm · 1.56mm/px · 1 of 36 slices shown (1 of 4)]
[im 1/36]
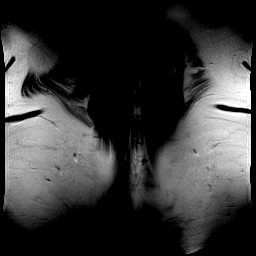

[Series 4: T1 · axial · 3.0mm · 1.19mm/px · z∈[-95,+118]mm · 6 of 144 slices shown]
[im 1/144]
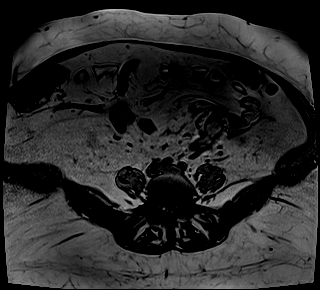
[im 29/144]
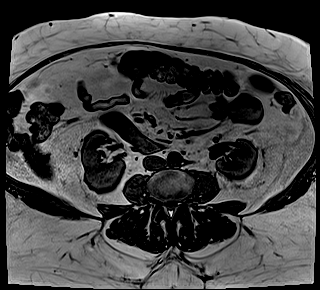
[im 58/144]
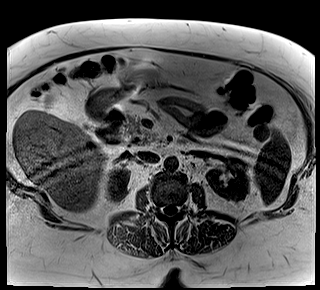
[im 86/144]
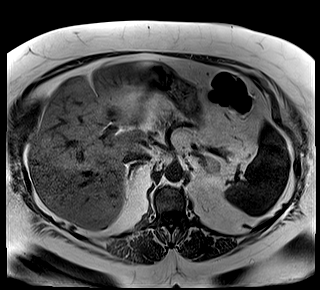
[im 115/144]
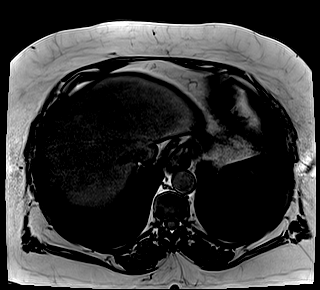
[im 144/144]
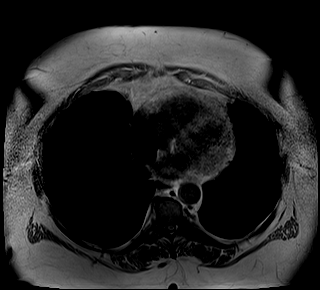

[Series 5: T2 · axial · 5.0mm · 1.48mm/px · 1 of 38 slices shown (2 of 4)]
[im 1/38]
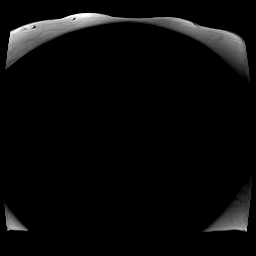

[Series 6: DWI · axial · 5.0mm · 1.42mm/px · z∈[-65,+145]mm · 4 of 108 slices shown (1 of 2)]
[im 1/108]
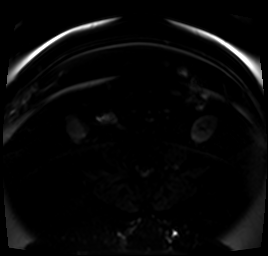
[im 36/108]
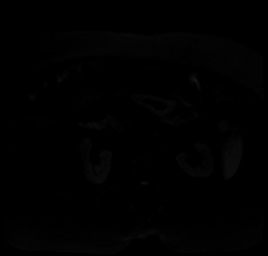
[im 72/108]
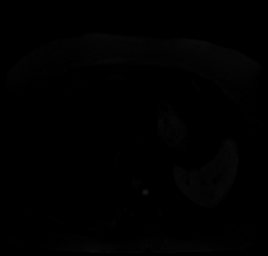
[im 108/108]
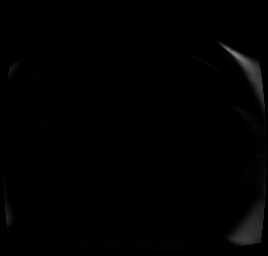

[Series 7: DWI · axial · 5.0mm · 1.42mm/px · 1 of 36 slices shown (2 of 2)]
[im 1/36]
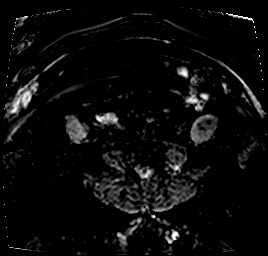

[Series 8: T2 · axial · 6.0mm · 1.22mm/px · 1 of 30 slices shown (3 of 4)]
[im 1/30]
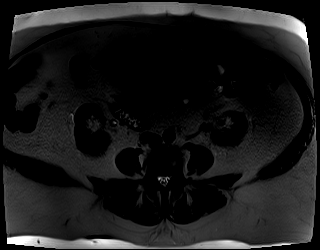

[Series 9: bSSFP · axial · 5.0mm · 1.25mm/px · 1 of 38 slices shown]
[im 1/38]
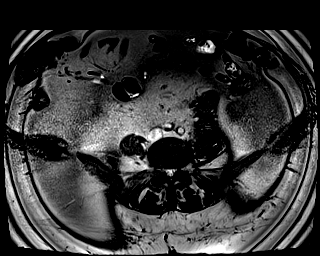

[Series 12: T2 · coronal · 3.0mm · 1.19mm/px · 1 of 19 slices shown (4 of 4)]
[im 1/19]
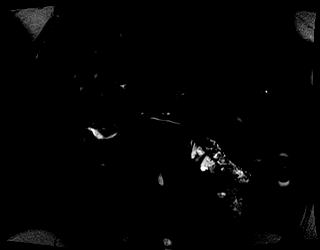

[Series 14: MRCP · coronal · 1.0mm · 0.49mm/px · 2 of 64 slices shown]
[im 1/64]
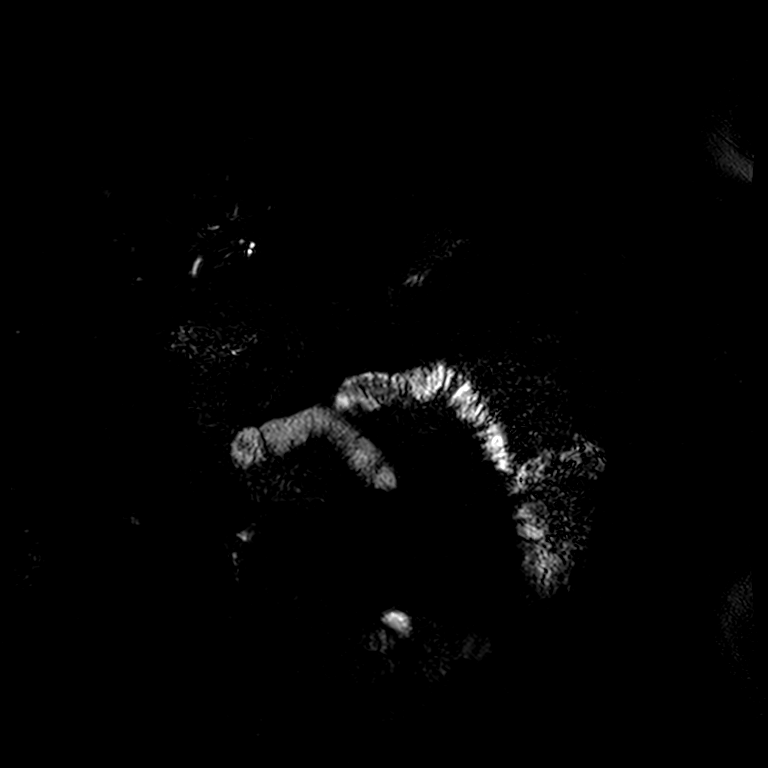
[im 64/64]
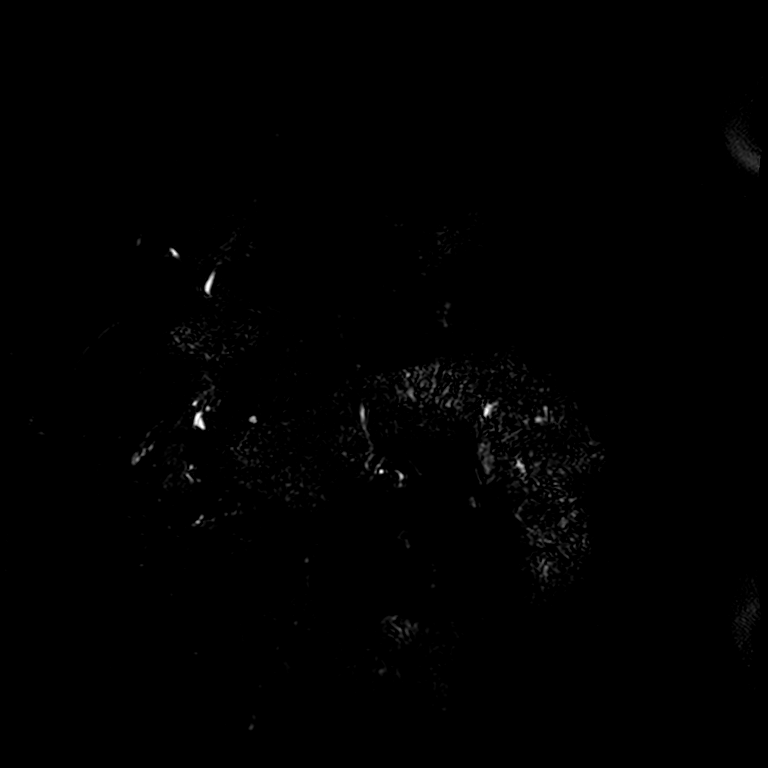

[Series 16: T1 dynamic · axial · non-contrast · 3.0mm · 1.25mm/px · z∈[-93,+120]mm · 3 of 72 slices shown]
[im 1/72]
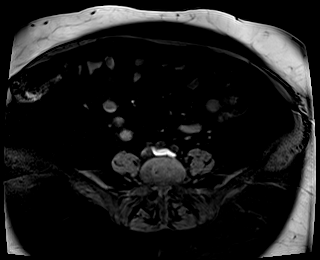
[im 36/72]
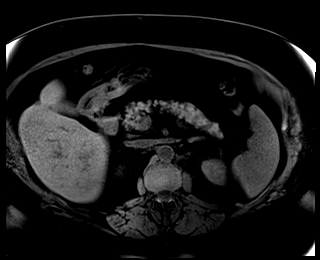
[im 72/72]
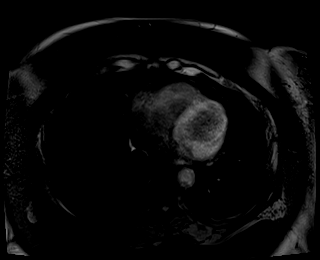

[Series 17: T1 dynamic post-contrast · axial · 3.0mm · 1.25mm/px · z∈[-93,+120]mm · 3 of 72 slices shown (1 of 8)]
[im 1/72]
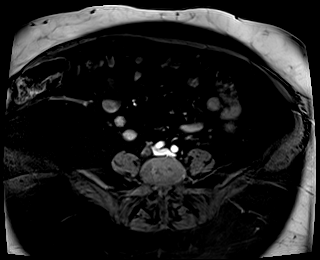
[im 36/72]
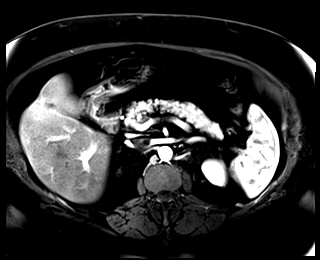
[im 72/72]
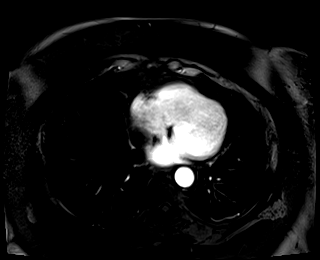

[Series 18: T1 dynamic post-contrast · axial · 3.0mm · 1.25mm/px · z∈[-93,+120]mm · 3 of 72 slices shown (2 of 8)]
[im 1/72]
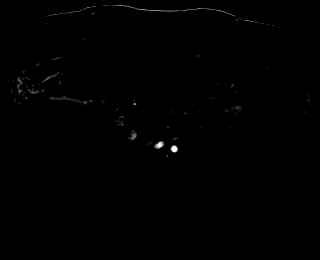
[im 36/72]
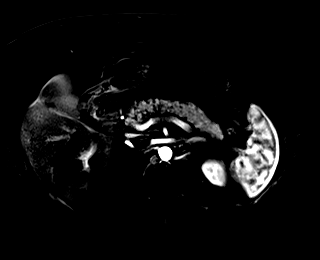
[im 72/72]
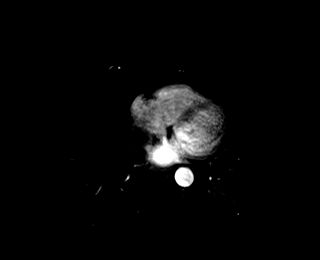

[Series 19: T1 dynamic post-contrast · axial · 3.0mm · 1.25mm/px · z∈[-93,+120]mm · 3 of 72 slices shown (3 of 8)]
[im 1/72]
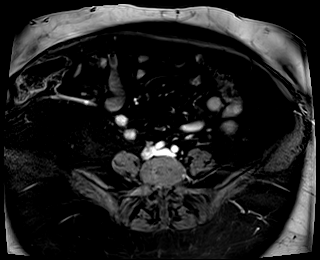
[im 36/72]
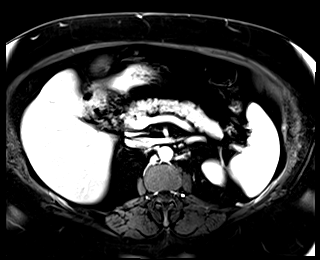
[im 72/72]
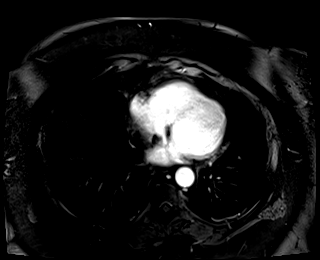

[Series 20: T1 dynamic post-contrast · axial · 3.0mm · 1.25mm/px · z∈[-93,+120]mm · 3 of 72 slices shown (4 of 8)]
[im 1/72]
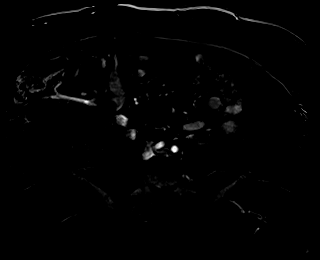
[im 36/72]
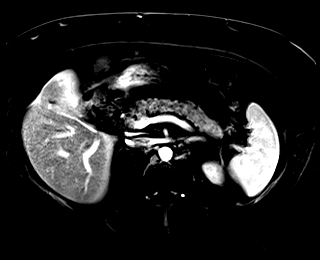
[im 72/72]
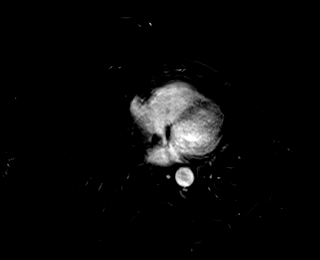

[Series 21: T1 dynamic post-contrast · axial · 3.0mm · 1.25mm/px · z∈[-93,+120]mm · 3 of 72 slices shown (5 of 8)]
[im 1/72]
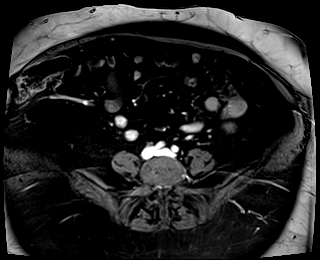
[im 36/72]
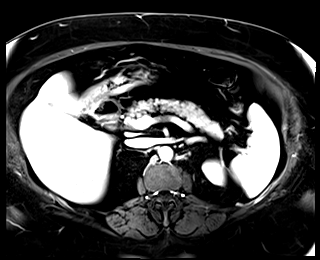
[im 72/72]
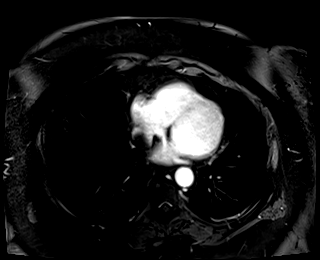

[Series 22: T1 dynamic post-contrast · axial · 3.0mm · 1.25mm/px · z∈[-93,+120]mm · 3 of 72 slices shown (6 of 8)]
[im 1/72]
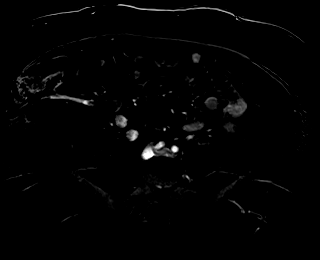
[im 36/72]
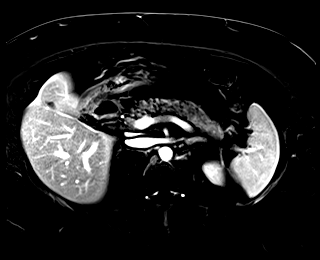
[im 72/72]
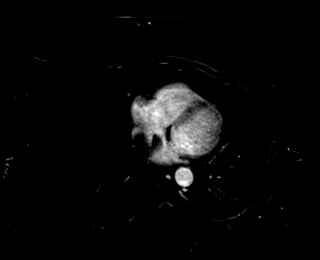

[Series 23: T1 dynamic post-contrast · coronal · 3.0mm · 1.25mm/px · 3 of 72 slices shown (7 of 8)]
[im 1/72]
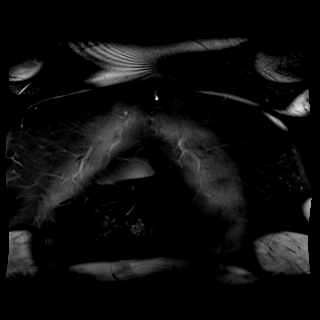
[im 36/72]
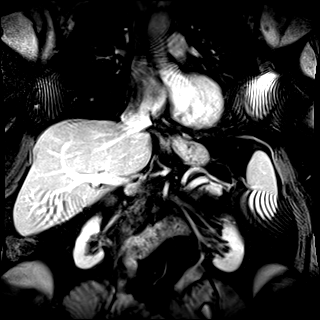
[im 72/72]
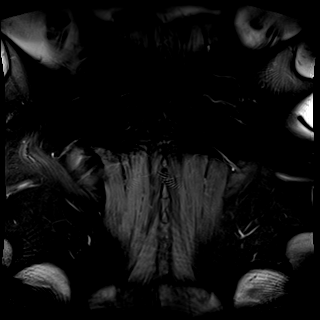

[Series 24: T1 dynamic post-contrast · axial · 3.0mm · 1.25mm/px · z∈[-93,+12]mm · 2 of 72 slices shown (8 of 8)]
[im 1/72]
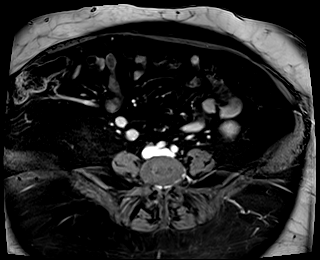
[im 36/72]
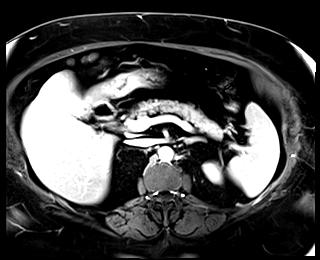

[44 of 48 positions shown; findings below may reference images not displayed]

FINDINGS: Lower chest:  Lung bases are clear.

Hepatobiliary: Loss of signal intensity on the liver opposed phase
imaging (series 4) consistent with hepatic steatosis. No
intrahepatic ductal dilatation. Post cholecystectomy. The common
bile duct is normal caliber. No filling defect or compression of the
common bile duct.

Pancreas: Normal pancreatic parenchymal intensity. No ductal
dilatation or inflammation.

Spleen: Normal spleen.

Adrenals/urinary tract: Adrenal glands and kidneys are normal.

Stomach/Bowel: Stomach and limited of the small bowel is
unremarkable

Vascular/Lymphatic: Abdominal aortic normal caliber. No
retroperitoneal periportal lymphadenopathy.

Musculoskeletal: No aggressive osseous lesion
IMPRESSION: 1. Hepatic steatosis.
2. Normal biliary tree post cholecystectomy.
3. Normal pancreas.

## 2018-01-09 DIAGNOSIS — N3946 Mixed incontinence: Secondary | ICD-10-CM | POA: Diagnosis not present

## 2018-01-09 DIAGNOSIS — I480 Paroxysmal atrial fibrillation: Secondary | ICD-10-CM | POA: Diagnosis not present

## 2018-01-09 DIAGNOSIS — M545 Low back pain: Secondary | ICD-10-CM | POA: Diagnosis not present

## 2018-01-09 DIAGNOSIS — Z6841 Body Mass Index (BMI) 40.0 and over, adult: Secondary | ICD-10-CM | POA: Diagnosis not present

## 2018-01-09 DIAGNOSIS — R350 Frequency of micturition: Secondary | ICD-10-CM | POA: Diagnosis not present

## 2018-01-14 DIAGNOSIS — I481 Persistent atrial fibrillation: Secondary | ICD-10-CM | POA: Diagnosis not present

## 2018-01-14 DIAGNOSIS — Z79899 Other long term (current) drug therapy: Secondary | ICD-10-CM | POA: Diagnosis not present

## 2018-01-17 DIAGNOSIS — R300 Dysuria: Secondary | ICD-10-CM | POA: Diagnosis not present

## 2018-01-17 DIAGNOSIS — M545 Low back pain: Secondary | ICD-10-CM | POA: Diagnosis not present

## 2018-01-22 DIAGNOSIS — Z885 Allergy status to narcotic agent status: Secondary | ICD-10-CM | POA: Diagnosis not present

## 2018-01-22 DIAGNOSIS — Z7901 Long term (current) use of anticoagulants: Secondary | ICD-10-CM | POA: Diagnosis not present

## 2018-01-22 DIAGNOSIS — Z90710 Acquired absence of both cervix and uterus: Secondary | ICD-10-CM | POA: Diagnosis not present

## 2018-01-22 DIAGNOSIS — Z79899 Other long term (current) drug therapy: Secondary | ICD-10-CM | POA: Diagnosis not present

## 2018-01-22 DIAGNOSIS — I481 Persistent atrial fibrillation: Secondary | ICD-10-CM | POA: Diagnosis not present

## 2018-01-22 DIAGNOSIS — E785 Hyperlipidemia, unspecified: Secondary | ICD-10-CM | POA: Diagnosis not present

## 2018-01-22 DIAGNOSIS — Z9049 Acquired absence of other specified parts of digestive tract: Secondary | ICD-10-CM | POA: Diagnosis not present

## 2018-01-27 DIAGNOSIS — L900 Lichen sclerosus et atrophicus: Secondary | ICD-10-CM | POA: Diagnosis not present

## 2018-01-27 DIAGNOSIS — F330 Major depressive disorder, recurrent, mild: Secondary | ICD-10-CM | POA: Diagnosis not present

## 2018-01-27 DIAGNOSIS — N3946 Mixed incontinence: Secondary | ICD-10-CM | POA: Diagnosis not present

## 2018-01-27 DIAGNOSIS — R350 Frequency of micturition: Secondary | ICD-10-CM | POA: Diagnosis not present

## 2018-01-27 DIAGNOSIS — I480 Paroxysmal atrial fibrillation: Secondary | ICD-10-CM | POA: Diagnosis not present

## 2018-01-27 DIAGNOSIS — H8103 Meniere's disease, bilateral: Secondary | ICD-10-CM | POA: Diagnosis not present

## 2018-01-27 DIAGNOSIS — Z6841 Body Mass Index (BMI) 40.0 and over, adult: Secondary | ICD-10-CM | POA: Diagnosis not present

## 2018-01-30 DIAGNOSIS — Z9889 Other specified postprocedural states: Secondary | ICD-10-CM | POA: Diagnosis not present

## 2018-01-30 DIAGNOSIS — E662 Morbid (severe) obesity with alveolar hypoventilation: Secondary | ICD-10-CM | POA: Diagnosis not present

## 2018-01-30 DIAGNOSIS — I480 Paroxysmal atrial fibrillation: Secondary | ICD-10-CM | POA: Diagnosis not present

## 2018-02-04 DIAGNOSIS — Z7901 Long term (current) use of anticoagulants: Secondary | ICD-10-CM | POA: Insufficient documentation

## 2018-02-04 DIAGNOSIS — I481 Persistent atrial fibrillation: Secondary | ICD-10-CM | POA: Diagnosis not present

## 2018-02-04 DIAGNOSIS — I4819 Other persistent atrial fibrillation: Secondary | ICD-10-CM | POA: Insufficient documentation

## 2018-02-16 ENCOUNTER — Emergency Department (HOSPITAL_COMMUNITY)
Admission: EM | Admit: 2018-02-16 | Discharge: 2018-02-16 | Disposition: A | Discharge status: Home / Self Care | Payer: Medicare Other | Attending: Emergency Medicine | Admitting: Emergency Medicine

## 2018-02-16 ENCOUNTER — Encounter (HOSPITAL_COMMUNITY): Payer: Self-pay

## 2018-02-16 ENCOUNTER — Other Ambulatory Visit: Payer: Self-pay

## 2018-02-16 ENCOUNTER — Emergency Department (HOSPITAL_COMMUNITY): Payer: Medicare Other

## 2018-02-16 DIAGNOSIS — I5032 Chronic diastolic (congestive) heart failure: Secondary | ICD-10-CM | POA: Insufficient documentation

## 2018-02-16 DIAGNOSIS — R0602 Shortness of breath: Secondary | ICD-10-CM | POA: Diagnosis not present

## 2018-02-16 DIAGNOSIS — E039 Hypothyroidism, unspecified: Secondary | ICD-10-CM | POA: Insufficient documentation

## 2018-02-16 DIAGNOSIS — Z79899 Other long term (current) drug therapy: Secondary | ICD-10-CM | POA: Diagnosis not present

## 2018-02-16 DIAGNOSIS — Z7901 Long term (current) use of anticoagulants: Secondary | ICD-10-CM | POA: Diagnosis not present

## 2018-02-16 DIAGNOSIS — I481 Persistent atrial fibrillation: Secondary | ICD-10-CM | POA: Diagnosis not present

## 2018-02-16 DIAGNOSIS — R079 Chest pain, unspecified: Secondary | ICD-10-CM | POA: Diagnosis not present

## 2018-02-16 DIAGNOSIS — J101 Influenza due to other identified influenza virus with other respiratory manifestations: Secondary | ICD-10-CM | POA: Diagnosis not present

## 2018-02-16 DIAGNOSIS — J45909 Unspecified asthma, uncomplicated: Secondary | ICD-10-CM | POA: Insufficient documentation

## 2018-02-16 DIAGNOSIS — Z87891 Personal history of nicotine dependence: Secondary | ICD-10-CM | POA: Insufficient documentation

## 2018-02-16 DIAGNOSIS — E785 Hyperlipidemia, unspecified: Secondary | ICD-10-CM | POA: Diagnosis not present

## 2018-02-16 LAB — CBC WITH DIFFERENTIAL/PLATELET
BASOS ABS: 0 10*3/uL (ref 0.0–0.1)
BASOS PCT: 0 %
EOS ABS: 0.2 10*3/uL (ref 0.0–0.7)
EOS PCT: 3 %
HCT: 43.4 % (ref 36.0–46.0)
Hemoglobin: 13.9 g/dL (ref 12.0–15.0)
LYMPHS PCT: 7 %
Lymphs Abs: 0.5 10*3/uL — ABNORMAL LOW (ref 0.7–4.0)
MCH: 29.5 pg (ref 26.0–34.0)
MCHC: 32 g/dL (ref 30.0–36.0)
MCV: 92.1 fL (ref 78.0–100.0)
MONO ABS: 0.8 10*3/uL (ref 0.1–1.0)
Monocytes Relative: 12 %
Neutro Abs: 5.2 10*3/uL (ref 1.7–7.7)
Neutrophils Relative %: 78 %
PLATELETS: 196 10*3/uL (ref 150–400)
RBC: 4.71 MIL/uL (ref 3.87–5.11)
RDW: 12.7 % (ref 11.5–15.5)
WBC: 6.6 10*3/uL (ref 4.0–10.5)

## 2018-02-16 LAB — COMPREHENSIVE METABOLIC PANEL
ALBUMIN: 4 g/dL (ref 3.5–5.0)
ALT: 16 U/L (ref 14–54)
AST: 19 U/L (ref 15–41)
Alkaline Phosphatase: 74 U/L (ref 38–126)
Anion gap: 12 (ref 5–15)
BILIRUBIN TOTAL: 1.1 mg/dL (ref 0.3–1.2)
BUN: 17 mg/dL (ref 6–20)
CALCIUM: 9.2 mg/dL (ref 8.9–10.3)
CO2: 25 mmol/L (ref 22–32)
Chloride: 102 mmol/L (ref 101–111)
Creatinine, Ser: 0.83 mg/dL (ref 0.44–1.00)
GFR calc Af Amer: >60 mL/min (ref >60)
GLUCOSE: 114 mg/dL — AB (ref 65–99)
POTASSIUM: 3.6 mmol/L (ref 3.5–5.1)
Sodium: 139 mmol/L (ref 135–145)
TOTAL PROTEIN: 7.3 g/dL (ref 6.5–8.1)

## 2018-02-16 LAB — TROPONIN I

## 2018-02-16 LAB — INFLUENZA PANEL BY PCR (TYPE A & B)
INFLAPCR: POSITIVE — AB
INFLBPCR: NEGATIVE

## 2018-02-16 LAB — I-STAT CG4 LACTIC ACID, ED: Lactic Acid, Venous: 1.52 mmol/L (ref 0.5–1.9)

## 2018-02-16 LAB — BRAIN NATRIURETIC PEPTIDE: B NATRIURETIC PEPTIDE 5: 133 pg/mL — AB (ref 0.0–100.0)

## 2018-02-16 MED ORDER — METHYLPREDNISOLONE SODIUM SUCC 125 MG IJ SOLR
125.0000 mg | Freq: Once | INTRAMUSCULAR | Status: AC
  Administered 2018-02-16: 125 mg via INTRAVENOUS
  Filled 2018-02-16: qty 2

## 2018-02-16 MED ORDER — IPRATROPIUM-ALBUTEROL 0.5-2.5 (3) MG/3ML IN SOLN
3.0000 mL | Freq: Once | RESPIRATORY_TRACT | Status: AC
  Administered 2018-02-16: 3 mL via RESPIRATORY_TRACT
  Filled 2018-02-16: qty 3

## 2018-02-16 MED ORDER — PREDNISONE 10 MG (21) PO TBPK: ORAL_TABLET | ORAL | 0 refills | Status: DC

## 2018-02-16 MED ORDER — ALBUTEROL (5 MG/ML) CONTINUOUS INHALATION SOLN
10.0000 mg/h | INHALATION_SOLUTION | Freq: Once | RESPIRATORY_TRACT | Status: AC
  Administered 2018-02-16: 10 mg/h via RESPIRATORY_TRACT
  Filled 2018-02-16: qty 20

## 2018-02-16 MED ORDER — ALBUTEROL SULFATE (5 MG/ML) 0.5% IN NEBU: 2.5000 mg | INHALATION_SOLUTION | Freq: Four times a day (QID) | RESPIRATORY_TRACT | 0 refills | Status: DC | PRN

## 2018-02-16 NOTE — ED Triage Notes (Addendum)
Pt reports she has felt like she had the flu since Friday. Reports fever, HA,body aches, chills and cough. States she is having difficulty breathing and her chest has started hurting

## 2018-02-16 NOTE — ED Notes (Signed)
Pt returned from X-ray.  

## 2018-02-16 NOTE — ED Provider Notes (Signed)
[facility] EMERGENCY DEPARTMENT Provider Note   CSN: [id number] Arrival date & time: 02/16/18  1553     History   Chief Complaint Chief Complaint  Patient presents with  . Chest Pain  . Shortness of Breath    HPI [name] is a 77 y.o. female.  Pt presents to the ED today with cp and sob.  She has not been feeling well since the 22nd.  She did have a fever of 100 today, but did not take any antipyretics.  She feels achy all over.  She denies n/v.  She is worried she has the flu.     Past Medical History:  Diagnosis Date  . Allergic rhinitis   . Anxiety   . Asthma    pt states she "believe it is more allergies"  . Cholecystitis   . Chronic abdominal pain   . Colon polyp    TCS 2005 TICS [place], TCS/PROPOFOL 2011 SIMPLE ADENOMAS  . Depression   . Diverticulosis 2005  . Fatty liver   . Fibromyalgia   . GERD (gastroesophageal reflux disease)   . Gout   . Hyperlipemia   . Hypothyroidism   . Internal hemorrhoids    H/o Rectal bleeding secondary to internal hemorrhoids 04/2010.  Marland Kitchen MCI (mild cognitive impairment) 12/30/2017  . Migraine   . Narcolepsy   . Obesity, morbid (more than 100 lbs over ideal weight or BMI > 40) (HCC)   . Obstructive sleep apnea on CPAP and O2  . Osteoarthritis   . Persistent atrial fibrillation ([place])    a. s/p DCCV 01/2012  . Volume overload    uses torsemide PRN    Patient Active Problem List   Diagnosis Date Noted  . MCI (mild cognitive impairment) 12/30/2017  . History of colonic polyps   . Diverticulosis of colon without hemorrhage   . Hiatal hernia   . RUQ abdominal pain 02/27/2016  . Dyspnea 05/03/2014  . Acute respiratory failure ([place]) 01/13/2014  . CAP (community acquired pneumonia) 01/12/2014  . RUQ pain 02/05/2013  . Rib pain on right side 02/05/2013  . Cholelithiasis 09/01/2012  . Atrial fibrillation ([place]) 09/01/2012  . Diastolic CHF ([place]) 94/85/4627  . INTERNAL HEMORRHOIDS 11/06/2010  . FATIGUE 11/06/2010  . Abdominal  pain 11/06/2010  . INTERTRIGO, CANDIDAL 06/03/2009  . VARICOSE VEINS LOWER EXTREMITIES W/INFLAMMATION 01/10/2009  . HYPOTHYROIDISM 11/30/2006  . HYPERLIPIDEMIA 11/30/2006  . OBESITY, MORBID 11/30/2006  . ANXIETY 11/30/2006  . DEPRESSION 11/30/2006  . OBSTRUCTIVE SLEEP APNEA 11/30/2006  . Common migraine 11/30/2006  . NARCOLEPSY W/CATAPLEXY 11/30/2006  . ALLERGIC RHINITIS 11/30/2006  . ASTHMA 11/30/2006  . GERD 11/30/2006  . OSTEOARTHRITIS 11/30/2006  . FIBROMYALGIA 11/30/2006  . PALPITATIONS 11/30/2006  . COLONIC POLYPS, HX OF 11/30/2006    Past Surgical History:  Procedure Laterality Date  . ABDOMINAL HYSTERECTOMY  FIBROIDS 1982  . BLADDER SUSPENSION  1982  . CARDIOVERSION  02/05/2012   Procedure: CARDIOVERSION;  Surgeon: Laverda Page, MD;  Location: [place];  Service: Cardiovascular;  Laterality: N/A;  . CARDIOVERSION N/A 06/09/2013   Procedure: CARDIOVERSION;  Surgeon: Laverda Page, MD;  Location: [facility] ENDOSCOPY;  Service: Cardiovascular;  Laterality: N/A;  h&p in file-[facility]  . CARDIOVERSION N/A 08/09/2015   Procedure: CARDIOVERSION;  Surgeon: Adrian Prows, MD;  Location: [facility] ENDOSCOPY;  Service: Cardiovascular;  Laterality: N/A;  . CARDIOVERSION N/A 09/13/2015   Procedure: CARDIOVERSION;  Surgeon: Adrian Prows, MD;  Location: [place];  Service: Cardiovascular;  Laterality: N/A;  .  CHOLECYSTECTOMY  09/03/2012   Procedure: LAPAROSCOPIC CHOLECYSTECTOMY;  Surgeon: Jamesetta So, MD;  Location: [facility];  Service: General;  Laterality: N/A;  . COLONOSCOPY  2005   Dr. Tamala Julian: sigmoid diverticulosis  . COLONOSCOPY  June 2011   Dr. Oneida Alar: internal hemorrhoids, simple adenomas, hyperplastic polyps, needs surveillance in June 2014 with overtube and Propofol  . COLONOSCOPY WITH PROPOFOL N/A 03/19/2016   Dr. Gala Romney: multiple small tubular adenomas, diverticulosis. Surveillance in 5 years   . ERCP  10/01/2012   Procedure: ENDOSCOPIC RETROGRADE CHOLANGIOPANCREATOGRAPHY (ERCP);  Surgeon:  Daneil Dolin, MD;  Location: [facility];  Service: Endoscopy;  Laterality: N/A;  . ESOPHAGOGASTRODUODENOSCOPY (EGD) WITH PROPOFOL N/A 03/19/2016   DR. Rourk: normal esohpagus with small hiatal hernia   . Kidney Stent  1995  . POLYPECTOMY  03/19/2016   Procedure: POLYPECTOMY;  Surgeon: Daneil Dolin, MD;  [facility];  Service: Endoscopy;;  cold snares  . REMOVAL OF STONES  10/01/2012   Procedure: REMOVAL OF STONES;  Surgeon: Daneil Dolin, MD;  Location: [facility];  Service: Endoscopy;  Laterality: N/A;  . RHINOPLASTY    . SPHINCTEROTOMY  10/01/2012   Procedure: SPHINCTEROTOMY;  Surgeon: Daneil Dolin, MD;  Location: [facility];  Service: Endoscopy;  Laterality: N/A;     OB History   None      Home Medications    Prior to Admission medications   Medication Sig Start Date End Date Taking? Authorizing Provider  albuterol (PROVENTIL,VENTOLIN) 90 MCG/ACT inhaler Inhale 2 puffs into the lungs every 6 (six) hours as needed for wheezing or shortness of breath.    Yes [provider]  allopurinol (ZYLOPRIM) 100 MG tablet Take 1 tablet by mouth daily. 10/17/17  Yes [provider]  amiodarone (PACERONE) 200 MG tablet 200 mg daily.  01/14/18  Yes [provider]  apixaban (ELIQUIS) 5 MG TABS tablet Take 5 mg by mouth 2 (two) times daily.   Yes [provider]  esomeprazole (NEXIUM) 40 MG capsule Take 40 mg by mouth every evening.    Yes [provider]  fluticasone (FLONASE) 50 MCG/ACT nasal spray Place 2 sprays into both nostrils as needed for allergies or rhinitis.    Yes [provider]  levothyroxine (SYNTHROID, LEVOTHROID) 50 MCG tablet Take 50 mcg by mouth daily before breakfast.   Yes [provider]  LORazepam (ATIVAN) 0.5 MG tablet Take 1.5 mg by mouth 3 (three) times daily.    Yes [provider]  miconazole (MICOTIN) 2 % cream Apply 1 application topically as needed.   Yes [provider]    nystatin-triamcinolone ointment (MYCOLOG) Apply 1 application topically as needed. 10/29/17  Yes [provider]  Polyethyl Glycol-Propyl Glycol (SYSTANE OP) Apply 2 drops to eye as needed. In both eyes   Yes [provider]  propranolol (INNOPRAN XL) 80 MG 24 hr capsule Take 80 mg by mouth daily.   Yes [provider]  albuterol (PROVENTIL) (5 MG/ML) 0.5% nebulizer solution Take 0.5 mLs (2.5 mg total) by nebulization every 6 (six) hours as needed for wheezing or shortness of breath. 02/16/18   Isla Pence, MD  Cholecalciferol (VITAMIN D3) 5000 units CAPS Take 1 capsule by mouth daily.    [provider]  FLUoxetine (PROZAC) 20 MG tablet Take by mouth.    [provider]  nitrofurantoin, macrocrystal-monohydrate, (MACROBID) 100 MG capsule  01/13/18   [provider]  predniSONE (STERAPRED UNI-PAK 21 TAB) 10 MG (21)  TBPK tablet Take 6 tabs by mouth daily  for 2 days, then 5 tabs for 2 days, then 4 tabs for 2 days, then 3 tabs for 2 days, 2 tabs for 2 days, then 1 tab by mouth daily for 2 days 02/16/18   Isla Pence, MD  vitamin E 100 UNIT capsule Take by mouth daily. Pt unsure of dosage    [provider]    Family History Family History  Problem Relation Age of Onset  . Colon cancer Mother 52  . Diabetes Mother   . Asthma Mother   . Heart disease Mother   . Cerebral aneurysm Father   . Asthma Maternal Aunt   . Asthma Brother   . Heart disease Maternal Grandfather   . Heart disease Maternal Grandmother     Social History Social History   Tobacco Use  . Smoking status: Former Smoker    Packs/day: 0.50    Years: 30.00    Pack years: 15.00    Last attempt to quit: 10/03/1989    Years since quitting: 28.3  . Smokeless tobacco: Never Used  . Tobacco comment: Quit in 1992  Substance Use Topics  . Alcohol use: No    Alcohol/week: 0.0 oz  . Drug use: No     Allergies   Demerol [meperidine]; Atorvastatin; Uloric  [febuxostat]; and Zocor [simvastatin - high dose]   Review of Systems Review of Systems  Constitutional: Positive for fever.  Respiratory: Positive for shortness of breath.   Cardiovascular: Positive for chest pain.  Musculoskeletal: Positive for myalgias.  All other systems reviewed and are negative.    Physical Exam Updated Vital Signs BP 122/62   Pulse 61   Temp 98.2 F (36.8 C) (Oral)   Resp 16   Ht 5\' 2"  (1.575 m)   Wt 120.2 kg (265 lb)   SpO2 96%   BMI 48.47 kg/m   Physical Exam  Constitutional: She is oriented to person, place, and time. She appears well-developed and well-nourished.  HENT:  Head: Normocephalic and atraumatic.  Eyes: Pupils are equal, round, and reactive to light. EOM are normal.  Neck: Normal range of motion. Neck supple.  Cardiovascular: Normal rate and regular rhythm.  Pulmonary/Chest: She has wheezes.  Abdominal: Soft. Bowel sounds are normal.  Musculoskeletal: Normal range of motion.       Right lower leg: Normal.       Left lower leg: Normal.  Neurological: She is alert and oriented to person, place, and time.  Skin: Skin is warm and dry. Capillary refill takes less than 2 seconds.  Psychiatric: She has a normal mood and affect. Her behavior is normal.  Nursing note and vitals reviewed.    ED Treatments / Results  Labs (all labs ordered are listed, but only abnormal results are displayed) Labs Reviewed  COMPREHENSIVE METABOLIC PANEL - Abnormal; Notable for the following components:      Result Value   Glucose, Bld 114 (*)    All other components within normal limits  CBC WITH DIFFERENTIAL/PLATELET - Abnormal; Notable for the following components:   Lymphs Abs 0.5 (*)    All other components within normal limits  BRAIN NATRIURETIC PEPTIDE - Abnormal; Notable for the following components:   B Natriuretic Peptide 133.0 (*)    All other components within normal limits  INFLUENZA PANEL BY PCR (TYPE A & B) - Abnormal; Notable for the  following components:   Influenza A By PCR POSITIVE (*)    All other  components within normal limits  CULTURE, BLOOD (ROUTINE X 2)  CULTURE, BLOOD (ROUTINE X 2)  TROPONIN I  I-STAT CG4 LACTIC ACID, ED  I-STAT CG4 LACTIC ACID, ED    EKG EKG Interpretation  Date/Time:  "Sunday February 16 2018 16:16:24 EDT Ventricular Rate:  69 PR Interval:    QRS Duration: 97 QT Interval:  396 QTC Calculation: 431 R Axis:   -33 Text Interpretation:  Sinus rhythm Left axis deviation Low voltage, precordial leads Borderline T abnormalities, anterior leads Borderline ST elevation, lateral leads Baseline wander in lead(s) V1 pt in NSR which is new from last ekg where she was in a.fib. Confirmed by Sakari Raisanen ([id number]) on 02/16/2018 4:20:37 PM   Radiology Dg Chest 2 View  Result Date: 02/16/2018 CLINICAL DATA:  Shortness of breath with flu-like symptoms 2 days. EXAM: CHEST - 2 VIEW COMPARISON:  07/18/2017 FINDINGS: Lungs are adequately inflated without focal airspace consolidation or effusion. Cardiomediastinal silhouette and remainder of the exam is unchanged. IMPRESSION: No active cardiopulmonary disease. Electronically Signed   By: Daniel  Boyle M.D.   On: 02/16/2018 17:12    Procedures Procedures (including critical care time)  Medications Ordered in ED Medications  ipratropium-albuterol (DUONEB) 0.5-2.5 (3) MG/3ML nebulizer solution 3 mL (3 mLs Nebulization Given 02/16/18 1624)  methylPREDNISolone sodium succinate (SOLU-MEDROL) 125 mg/2 mL injection 125 mg (125 mg Intravenous Given 02/16/18 1743)  albuterol (PROVENTIL,VENTOLIN) solution continuous neb (10 mg/hr Nebulization Given 02/16/18 1930)     Initial Impression / Assessment and Plan / ED Course  I have reviewed the triage vital signs and the nursing notes.  Pertinent labs & imaging results that were available during my care of the patient were reviewed by me and considered in my medical decision making (see chart for details).     Pt is  feeling much better.  She was able to ambulate with a slight drop to 89% briefly, but she maintained 92%.  She was offered admission, but wants to go home.  She did not want to take tamiflu since she's on amiodarone.  She knows to return if worse.  F/u with pcp.  Final Clinical Impressions(s) / ED Diagnoses   Final diagnoses:  Influenza A    ED Discharge Orders        Ordered    predniSONE (STERAPRED UNI-PAK 21 TAB) 10 MG (21) TBPK tablet     02/16/18 2047    albuterol (PROVENTIL) (5 MG/ML) 0.5% nebulizer solution  Every 6 hours PRN     03" /24/19 2047       Isla Pence, MD 02/16/18 2050

## 2018-02-16 NOTE — ED Notes (Addendum)
Pt ambulated in hallway with steady gait, oxygen saturation dropping to lowest value of 89%. Maintained at 92%. Pt walked around the nurses station, denied feeling dizzy or lightheaded. Nurse noted regular respirations until the end of the walk around the nurses station. Pt started to breath heavily towards the end but denied shortness of breath, lightheadedness of dizziness.

## 2018-02-18 DIAGNOSIS — J111 Influenza due to unidentified influenza virus with other respiratory manifestations: Secondary | ICD-10-CM | POA: Diagnosis not present

## 2018-02-18 DIAGNOSIS — I480 Paroxysmal atrial fibrillation: Secondary | ICD-10-CM | POA: Diagnosis not present

## 2018-02-18 DIAGNOSIS — Z6841 Body Mass Index (BMI) 40.0 and over, adult: Secondary | ICD-10-CM | POA: Diagnosis not present

## 2018-02-21 LAB — CULTURE, BLOOD (ROUTINE X 2)
CULTURE: NO GROWTH
Culture: NO GROWTH
SPECIAL REQUESTS: ADEQUATE
Special Requests: ADEQUATE

## 2018-02-25 DIAGNOSIS — J4532 Mild persistent asthma with status asthmaticus: Secondary | ICD-10-CM | POA: Diagnosis not present

## 2018-02-25 DIAGNOSIS — J111 Influenza due to unidentified influenza virus with other respiratory manifestations: Secondary | ICD-10-CM | POA: Diagnosis not present

## 2018-02-25 DIAGNOSIS — Z6841 Body Mass Index (BMI) 40.0 and over, adult: Secondary | ICD-10-CM | POA: Diagnosis not present

## 2018-02-25 DIAGNOSIS — I480 Paroxysmal atrial fibrillation: Secondary | ICD-10-CM | POA: Diagnosis not present

## 2018-02-25 DIAGNOSIS — J209 Acute bronchitis, unspecified: Secondary | ICD-10-CM | POA: Diagnosis not present

## 2018-03-03 DIAGNOSIS — Z6841 Body Mass Index (BMI) 40.0 and over, adult: Secondary | ICD-10-CM | POA: Diagnosis not present

## 2018-03-03 DIAGNOSIS — R1011 Right upper quadrant pain: Secondary | ICD-10-CM | POA: Diagnosis not present

## 2018-03-03 DIAGNOSIS — J4532 Mild persistent asthma with status asthmaticus: Secondary | ICD-10-CM | POA: Diagnosis not present

## 2018-03-03 DIAGNOSIS — I480 Paroxysmal atrial fibrillation: Secondary | ICD-10-CM | POA: Diagnosis not present

## 2018-03-03 DIAGNOSIS — J309 Allergic rhinitis, unspecified: Secondary | ICD-10-CM | POA: Diagnosis not present

## 2018-03-10 ENCOUNTER — Ambulatory Visit (INDEPENDENT_AMBULATORY_CARE_PROVIDER_SITE_OTHER): Payer: Medicare Other | Admitting: Internal Medicine

## 2018-03-10 ENCOUNTER — Encounter: Payer: Self-pay | Admitting: Internal Medicine

## 2018-03-10 ENCOUNTER — Encounter (INDEPENDENT_AMBULATORY_CARE_PROVIDER_SITE_OTHER): Payer: Self-pay

## 2018-03-10 VITALS — BP 118/60 | HR 53 | Ht 62.0 in | Wt 267.0 lb

## 2018-03-10 DIAGNOSIS — H903 Sensorineural hearing loss, bilateral: Secondary | ICD-10-CM | POA: Insufficient documentation

## 2018-03-10 DIAGNOSIS — H9319 Tinnitus, unspecified ear: Secondary | ICD-10-CM | POA: Insufficient documentation

## 2018-03-10 DIAGNOSIS — I481 Persistent atrial fibrillation: Secondary | ICD-10-CM

## 2018-03-10 DIAGNOSIS — I4819 Other persistent atrial fibrillation: Secondary | ICD-10-CM

## 2018-03-10 NOTE — Patient Instructions (Addendum)
Medication Instructions:  Your physician recommends that you continue on your current medications as directed. Please refer to the Current Medication list given to you today.  Labwork: None ordered.  Testing/Procedures: None ordered.  Follow-Up: Your physician wants you to follow-up in: as needed with Dr. Rayann Heman.    Any Other Special Instructions Will Be Listed Below (If Applicable).  You are being referred to [facility] [employer] - weight loss specialist.  If you need a refill on your cardiac medications before your next appointment, please call your pharmacy.

## 2018-03-10 NOTE — Progress Notes (Signed)
Electrophysiology Office Note   Date:  03/10/2018   ID:  [name], DOB September 20, 1941, MRN [id number]  PCP:  Denny Levy, PA  Cardiologist: Dr Einar Gip EP Dr Ty Hilts [facility] PA-C) [facility] in [place]: afib   History of Present Illness: [name] is a 77 y.o. female who presents today for electrophysiology evaluation.   She presents today as a new patient for evaluation of atrial fibrillation.  I saw her 04/17/2013 (my note reviewed) on consultation from Dr Einar Gip.  She was initially diagnosed with afib in 2013, though has had palpitations "for years".  When I saw her, we tried flecainide, without success.  Weight loss strategies were advised.  She followed with Dr Einar Gip and eventually rate control was decided upon.  She has severe LA enlargement, persistent atrial fibrillation, hypoventilation syndrome and obesity. She did well with rate control.  She reports that she did have SOB with activity, weakness, and a sense of "heaviness".  She decided to pursue a second opinion and went to see Dr Ty Hilts at [facility] in [place].  She was placed on amiodarone and cardioverted. She did not feel any better with sinus rhythm.  Her symptoms of SOB, weakness, and "heaviness" were actually worse on amiodarone. She presents today with a lists of issues that she had with amiodarone.  She stopped this medicine.  She remains in sinus but feels no better that when she was rate controlled with her afib.  Today, she denies symptoms of palpitations, chest pain, claudication, dizziness, presyncope, syncope, bleeding, or neurologic sequela. The patient is tolerating medications without difficulties and is otherwise without complaint today.    Past Medical History:  Diagnosis Date  . Allergic rhinitis   . Anxiety   . Asthma    pt states she "believe it is more allergies"  . Cholecystitis   . Chronic abdominal pain   . Colon polyp    TCS 2005 TICS [place], TCS/PROPOFOL 2011 SIMPLE [place]ENOMAS  .  Depression   . Diverticulosis 2005  . Fatty liver   . Fibromyalgia   . GERD (gastroesophageal reflux disease)   . Gout   . Hyperlipemia   . Hypothyroidism   . Internal hemorrhoids    H/o Rectal bleeding secondary to internal hemorrhoids 04/2010.  Marland Kitchen MCI (mild cognitive impairment) 12/30/2017  . Migraine   . Narcolepsy   . Obesity, morbid (more than 100 lbs over ideal weight or BMI > 40) (HCC)   . Obstructive sleep apnea on CPAP and O2  . Osteoarthritis   . Persistent atrial fibrillation ([place])    a. s/p DCCV 01/2012  . Volume overload    uses torsemide PRN   Past Surgical History:  Procedure Laterality Date  . ABDOMINAL HYSTERECTOMY  FIBROIDS 1982  . BLADDER SUSPENSION  1982  . CARDIOVERSION  02/05/2012   Procedure: CARDIOVERSION;  Surgeon: Laverda Page, MD;  Location: [place];  Service: Cardiovascular;  Laterality: N/A;  . CARDIOVERSION N/A 06/09/2013   Procedure: CARDIOVERSION;  Surgeon: Laverda Page, MD;  Location: [facility] ENDOSCOPY;  Service: Cardiovascular;  Laterality: N/A;  h&p in file-[facility]  . CARDIOVERSION N/A 08/09/2015   Procedure: CARDIOVERSION;  Surgeon: Adrian Prows, MD;  Location: [facility] ENDOSCOPY;  Service: Cardiovascular;  Laterality: N/A;  . CARDIOVERSION N/A 09/13/2015   Procedure: CARDIOVERSION;  Surgeon: Adrian Prows, MD;  Location: [facility] ENDOSCOPY;  Service: Cardiovascular;  Laterality: N/A;  . CHOLECYSTECTOMY  09/03/2012   Procedure: LAPAROSCOPIC CHOLECYSTECTOMY;  Surgeon: Jamesetta So,  MD;  Location: [facility];  Service: General;  Laterality: N/A;  . COLONOSCOPY  2005   Dr. Tamala Julian: sigmoid diverticulosis  . COLONOSCOPY  June 2011   Dr. Oneida Alar: internal hemorrhoids, simple adenomas, hyperplastic polyps, needs surveillance in June 2014 with overtube and Propofol  . COLONOSCOPY WITH PROPOFOL N/A 03/19/2016   Dr. Gala Romney: multiple small tubular adenomas, diverticulosis. Surveillance in 5 years   . ERCP  10/01/2012   Procedure: ENDOSCOPIC RETROGRADE CHOLANGIOPANCREATOGRAPHY (ERCP);   Surgeon: Daneil Dolin, MD;  Location: [facility];  Service: Endoscopy;  Laterality: N/A;  . ESOPHAGOGASTRODUODENOSCOPY (EGD) WITH PROPOFOL N/A 03/19/2016   DR. Rourk: normal esohpagus with small hiatal hernia   . Kidney Stent  1995  . POLYPECTOMY  03/19/2016   Procedure: POLYPECTOMY;  Surgeon: Daneil Dolin, MD;  [facility];  Service: Endoscopy;;  cold snares  . REMOVAL OF STONES  10/01/2012   Procedure: REMOVAL OF STONES;  Surgeon: Daneil Dolin, MD;  Location[facility];  Service: Endoscopy;  Laterality: N/A;  . RHINOPLASTY    . SPHINCTEROTOMY  10/01/2012   Procedure: SPHINCTEROTOMY;  Surgeon: Daneil Dolin, MD;  Location: [facility];  Service: Endoscopy;  Laterality: N/A;     Current Outpatient Medications  Medication Sig Dispense Refill  . albuterol (PROVENTIL) (5 MG/ML) 0.5% nebulizer solution Take 0.5 mLs (2.5 mg total) by nebulization every 6 (six) hours as needed for wheezing or shortness of breath. 20 mL 0  . albuterol (PROVENTIL,VENTOLIN) 90 MCG/ACT inhaler Inhale 2 puffs into the lungs every 6 (six) hours as needed for wheezing or shortness of breath.     . allopurinol (ZYLOPRIM) 100 MG tablet Take 1 tablet by mouth daily.    Marland Kitchen apixaban (ELIQUIS) 5 MG TABS tablet Take 5 mg by mouth 2 (two) times daily.    . Cholecalciferol (VITAMIN D3) 5000 units CAPS Take 1 capsule by mouth daily.    Marland Kitchen esomeprazole (NEXIUM) 40 MG capsule Take 40 mg by mouth every evening.     . fluticasone (FLONASE) 50 MCG/ACT nasal spray Place 2 sprays into both nostrils as needed for allergies or rhinitis.     Marland Kitchen levothyroxine (SYNTHROID, LEVOTHROID) 50 MCG tablet Take 50 mcg by mouth daily before breakfast.    . LORazepam (ATIVAN) 0.5 MG tablet Take 1.5 mg by mouth 3 (three) times daily.     . miconazole (MICOTIN) 2 % cream Apply 1 application topically as needed.    . nystatin-triamcinolone ointment (MYCOLOG) Apply 1 application topically as needed.    Vladimir Faster Glycol-Propyl Glycol (SYSTANE OP) Apply 2  drops to eye as needed. In both eyes    . propranolol (INDERAL) 40 MG tablet Take 40 mg by mouth daily.    . vitamin E 100 UNIT capsule Take by mouth daily. Pt unsure of dosage     No current facility-administered medications for this visit.     Allergies:   Meperidine; Atorvastatin; Febuxostat; Simvastatin; Statins; and Zocor [simvastatin - high dose]   Social History:  The patient  reports that she quit smoking about 28 years ago. She has a 15.00 pack-year smoking history. She has never used smokeless tobacco. She reports that she does not drink alcohol or use drugs.   Family History:  The patient's  family history includes Asthma in her brother, maternal aunt, and mother; Cerebral aneurysm in her father; Colon cancer (age of onset: 42) in her mother; Diabetes in her mother; Heart disease in her maternal grandfather, maternal grandmother, and  mother.    ROS:  Please see the history of present illness.   All other systems are personally reviewed and negative.    PHYSICAL EXAM: VS:  BP 118/60   Pulse (!) 53   Ht 5\' 2"  (1.575 m)   Wt 267 lb (121.1 kg)   BMI 48.83 kg/m  , BMI Body mass index is 48.83 kg/m. GEN: overweight, in no acute distress  HEENT: normal  Neck: no JVD, carotid bruits, or masses Cardiac: RRR; no murmurs, rubs, or gallops,no edema  Respiratory:  clear to auscultation bilaterally, normal work of breathing GI: soft, nontender, nondistended, + BS MS: no deformity or atrophy  Skin: warm and dry  Neuro:  Strength and sensation are intact Psych: euthymic mood, full affect  EKG:  EKG is ordered today. The ekg ordered today is personally reviewed and shows sinus rhythm 53 bpm, PR 186 msec, QRS 82 msec, Qtc 409 msec  Echo 10/15/17- EF 50-55%, LA 9mm  Recent Labs: 02/16/2018: ALT 16; B Natriuretic Peptide 133.0; BUN 17; Creatinine, Ser 0.83; Hemoglobin 13.9; Platelets 196; Potassium 3.6; Sodium 139  personally reviewed   Lipid Panel     Component Value Date/Time     CHOL 170 07/22/2009 0127   TRIG 129 07/22/2009 0127   HDL 48 07/22/2009 0127   CHOLHDL 3.5 Ratio 07/22/2009 0127   VLDL 26 07/22/2009 0127   LDLCALC 96 07/22/2009 0127   personally reviewed   Wt Readings from Last 3 Encounters:  03/10/18 267 lb (121.1 kg)  02/16/18 265 lb (120.2 kg)  12/30/17 263 lb 8 oz (119.5 kg)      Other studies personally reviewed: Additional studies/ records that were reviewed today include: over 30 pages from [facility] are reviewed today  Review of the above records today demonstrates: as above   ASSESSMENT AND PLAN:  1.  Persistent afib She has failed medical therapy with flecainide.  Has not tolerated amiodarone. Today in sinus but continues to feel bad.  We have had a long discussion today.  I told her that I believe that Dr Irven Shelling approach of rate control as a long term strategy was probably best for her, particularly as she feels no better in sinus. Given BMI > 40, she is not a candidate for ablation. Given significant LA enlargement and weight, I think that our ability to maintain sinus rhythm is low. Continue current medicines.  Continue long term anticoagulation  2. Morbid obesity Body mass index is 48.83 kg/m. Weight loss discussed She is not interested in weight loss surgery I will refer to Dennard Nip for weight management  Follow-up:  Return as needed Follow-up with Dr Einar Gip as scheduled  Current medicines are reviewed at length with the patient today.   The patient does not have concerns regarding her medicines.  The following changes were made today:  none  Labs/ tests ordered today include:  Orders Placed This Encounter  Procedures  . EKG 12-Lead     Signed, Thompson Grayer, MD  03/10/2018 10:59 AM     [facility] [address] [place] [address] [phone] (office) [phone] (fax)

## 2018-04-28 DIAGNOSIS — I482 Chronic atrial fibrillation: Secondary | ICD-10-CM | POA: Diagnosis not present

## 2018-04-30 DIAGNOSIS — F330 Major depressive disorder, recurrent, mild: Secondary | ICD-10-CM | POA: Diagnosis not present

## 2018-04-30 DIAGNOSIS — N3946 Mixed incontinence: Secondary | ICD-10-CM | POA: Diagnosis not present

## 2018-04-30 DIAGNOSIS — Z6841 Body Mass Index (BMI) 40.0 and over, adult: Secondary | ICD-10-CM | POA: Diagnosis not present

## 2018-04-30 DIAGNOSIS — D519 Vitamin B12 deficiency anemia, unspecified: Secondary | ICD-10-CM | POA: Diagnosis not present

## 2018-04-30 DIAGNOSIS — I480 Paroxysmal atrial fibrillation: Secondary | ICD-10-CM | POA: Diagnosis not present

## 2018-04-30 DIAGNOSIS — R5383 Other fatigue: Secondary | ICD-10-CM | POA: Diagnosis not present

## 2018-04-30 DIAGNOSIS — Z0001 Encounter for general adult medical examination with abnormal findings: Secondary | ICD-10-CM | POA: Diagnosis not present

## 2018-04-30 DIAGNOSIS — E559 Vitamin D deficiency, unspecified: Secondary | ICD-10-CM | POA: Diagnosis not present

## 2018-05-07 DIAGNOSIS — E662 Morbid (severe) obesity with alveolar hypoventilation: Secondary | ICD-10-CM | POA: Diagnosis not present

## 2018-05-07 DIAGNOSIS — Z9889 Other specified postprocedural states: Secondary | ICD-10-CM | POA: Diagnosis not present

## 2018-05-07 DIAGNOSIS — I482 Chronic atrial fibrillation: Secondary | ICD-10-CM | POA: Diagnosis not present

## 2018-05-13 ENCOUNTER — Ambulatory Visit (INDEPENDENT_AMBULATORY_CARE_PROVIDER_SITE_OTHER): Payer: Medicare Other | Admitting: Gastroenterology

## 2018-05-13 ENCOUNTER — Encounter: Payer: Self-pay | Admitting: Gastroenterology

## 2018-05-13 VITALS — BP 108/71 | HR 55 | Temp 97.0°F | Ht 62.0 in | Wt 270.6 lb

## 2018-05-13 DIAGNOSIS — R1011 Right upper quadrant pain: Secondary | ICD-10-CM

## 2018-05-13 NOTE — Patient Instructions (Signed)
I have given you samples of Restora to try. Let me know how you like this.  You can also try Digestive Advantage, [place], etc.   We will see you in 6 months!  It was a pleasure to see you today. I strive to create trusting relationships with patients to provide genuine, compassionate, and quality care. I value your feedback. If you receive a survey regarding your visit,  I greatly appreciate you taking time to fill this out.   Annitta Needs, PhD, ANP-BC [facility]

## 2018-05-13 NOTE — Progress Notes (Signed)
Referring Provider: Denny Levy, [place] Primary Care Physician:  Denny Levy, [place]  Chief Complaint  Patient presents with  . Abdominal Pain    ruq  . Bloated    HPI:   [name] is a 77 y.o. female presenting today with a history of chronic RUQ pain, thoroughly evaluated here and seen by Dr. Roney Mans at [facility], who felt distension of hepatic capsule associated with hepatic steatosis was the culprit, recommending low-fat diet, weight reduction, and trial of Vit E 800 units daily.   Weight 263 in Dec 2018, now 270. Has a lot of problems with bloating. Still with baseline RUQ pain. At times will feel very tight and bloated. At times can relate it to certain foods that are starchy. Denies nausea. Felt bloated this morning but hadn't eaten anything. Miralax occasionally for constipation.   Prior evaluation: normal EGD April 2017, MRI in 2017 with fatty liver, normal biliary tree, normal pancreas. CT March 2018 with fatty liver. She states occasionally sees clay colored stool but it is dependent upon food choices.     Past Medical History:  Diagnosis Date  . Allergic rhinitis   . Anxiety   . Asthma    pt states she "believe it is more allergies"  . Cholecystitis   . Chronic abdominal pain   . Colon polyp    TCS 2005 TICS [place], TCS/PROPOFOL 2011 SIMPLE [place]OMAS  . Depression   . Diverticulosis 2005  . Fatty liver   . Fibromyalgia   . GERD (gastroesophageal reflux disease)   . Gout   . Hyperlipemia   . Hypothyroidism   . Internal hemorrhoids    H/o Rectal bleeding secondary to internal hemorrhoids 04/2010.  Marland Kitchen MCI (mild cognitive impairment) 12/30/2017  . Migraine   . Narcolepsy   . Obesity, morbid (more than 100 lbs over ideal weight or BMI > 40) (HCC)   . Obstructive sleep apnea on CPAP and O2  . Osteoarthritis   . Persistent atrial fibrillation ([place])    a. s/p DCCV 01/2012  . Volume overload    uses torsemide PRN    Past Surgical History:  Procedure Laterality Date    . ABDOMINAL HYSTERECTOMY  FIBROIDS 1982  . BLADDER SUSPENSION  1982  . CARDIOVERSION  02/05/2012   Procedure: CARDIOVERSION;  Surgeon: Laverda Page, MD;  Location: [facility];  Service: Cardiovascular;  Laterality: N/A;  . CARDIOVERSION N/A 06/09/2013   Procedure: CARDIOVERSION;  Surgeon: Laverda Page, MD;  Location: [facility] ENDOSCOPY;  Service: Cardiovascular;  Laterality: N/A;  h&p in file-[place]  . CARDIOVERSION N/A 08/09/2015   Procedure: CARDIOVERSION;  Surgeon: Adrian Prows, MD;  Location: [facility] ENDOSCOPY;  Service: Cardiovascular;  Laterality: N/A;  . CARDIOVERSION N/A 09/13/2015   Procedure: CARDIOVERSION;  Surgeon: Adrian Prows, MD;  Location: [facility] ENDOSCOPY;  Service: Cardiovascular;  Laterality: N/A;  . CHOLECYSTECTOMY  09/03/2012   Procedure: LAPAROSCOPIC CHOLECYSTECTOMY;  Surgeon: Jamesetta So, MD;  Location: [facility];  Service: General;  Laterality: N/A;  . COLONOSCOPY  2005   Dr. Tamala Julian: sigmoid diverticulosis  . COLONOSCOPY  June 2011   Dr. Oneida Alar: internal hemorrhoids, simple adenomas, hyperplastic polyps, needs surveillance in June 2014 with overtube and Propofol  . COLONOSCOPY WITH PROPOFOL N/A 03/19/2016   Dr. Gala Romney: multiple small tubular adenomas, diverticulosis. Surveillance in 5 years   . ERCP  10/01/2012   Procedure: ENDOSCOPIC RETROGRADE CHOLANGIOPANCREATOGRAPHY (ERCP);  Surgeon: Daneil Dolin, MD;  Location: [facility];  Service: Endoscopy;  Laterality: N/A;  .  ESOPHAGOGASTRODUODENOSCOPY (EGD) WITH PROPOFOL N/A 03/19/2016   DR. Rourk: normal esohpagus with small hiatal hernia   . Kidney Stent  1995  . POLYPECTOMY  03/19/2016   Procedure: POLYPECTOMY;  Surgeon: Daneil Dolin, MD;  [facility];  Service: Endoscopy;;  cold snares  . REMOVAL OF STONES  10/01/2012   Procedure: REMOVAL OF STONES;  Surgeon: Daneil Dolin, MD;  Location: [facility];  Service: Endoscopy;  Laterality: N/A;  . RHINOPLASTY    . SPHINCTEROTOMY  10/01/2012   Procedure: SPHINCTEROTOMY;  Surgeon: Daneil Dolin, MD;  Location: [facility];  Service: Endoscopy;  Laterality: N/A;    Current Outpatient Medications  Medication Sig Dispense Refill  . albuterol (PROVENTIL) (5 MG/ML) 0.5% nebulizer solution Take 0.5 mLs (2.5 mg total) by nebulization every 6 (six) hours as needed for wheezing or shortness of breath. 20 mL 0  . albuterol (PROVENTIL,VENTOLIN) 90 MCG/ACT inhaler Inhale 2 puffs into the lungs every 6 (six) hours as needed for wheezing or shortness of breath.     . allopurinol (ZYLOPRIM) 100 MG tablet Take 1 tablet by mouth daily.    Marland Kitchen apixaban (ELIQUIS) 5 MG TABS tablet Take 5 mg by mouth 2 (two) times daily.    . Ergocalciferol (VITAMIN D2) 2000 units TABS Take 1 tablet by mouth daily.    Marland Kitchen esomeprazole (NEXIUM) 40 MG capsule Take 40 mg by mouth every evening.     . fluticasone (FLONASE) 50 MCG/ACT nasal spray Place 2 sprays into both nostrils as needed for allergies or rhinitis.     Marland Kitchen levothyroxine (SYNTHROID, LEVOTHROID) 50 MCG tablet Take 50 mcg by mouth daily before breakfast.    . LORazepam (ATIVAN) 0.5 MG tablet Take 1.5 mg by mouth 3 (three) times daily.     . miconazole (MICOTIN) 2 % cream Apply 1 application topically as needed.    . nystatin-triamcinolone ointment (MYCOLOG) Apply 1 application topically as needed.    Vladimir Faster Glycol-Propyl Glycol (SYSTANE OP) Apply 2 drops to eye as needed. In both eyes    . propranolol (INDERAL) 40 MG tablet Take 40 mg by mouth daily.    . Cholecalciferol (VITAMIN D3) 5000 units CAPS Take 1 capsule by mouth daily.    . vitamin E 100 UNIT capsule Take by mouth daily. Pt unsure of dosage     No current facility-administered medications for this visit.     Allergies as of 05/13/2018 - Review Complete 05/13/2018  Allergen Reaction Noted  . Meperidine Rash and Other (See Comments) 09/30/2012  . Atorvastatin Other (See Comments) 11/30/2006  . Febuxostat Other (See Comments) 12/31/2016  . Simvastatin  10/15/2017  . Statins  10/15/2017  . Zocor  [simvastatin - high dose] Other (See Comments) 01/25/2012    Family History  Problem Relation Age of Onset  . Colon cancer Mother 63  . Diabetes Mother   . Asthma Mother   . Heart disease Mother   . Cerebral aneurysm Father   . Asthma Maternal Aunt   . Asthma Brother   . Heart disease Maternal Grandfather   . Heart disease Maternal Grandmother     Social History   Socioeconomic History  . Marital status: Widowed    Spouse name: Widowed  . Number of children: 2  . Years of education: GED  . Highest education level: Not on file  Occupational History  . Occupation: Agricultural engineer  Social Needs  . Financial resource strain: Not on file  . Food insecurity:  Worry: Not on file    Inability: Not on file  . Transportation needs:    Medical: Not on file    Non-medical: Not on file  Tobacco Use  . Smoking status: Former Smoker    Packs/day: 0.50    Years: 30.00    Pack years: 15.00    Last attempt to quit: 10/03/1989    Years since quitting: 28.6  . Smokeless tobacco: Never Used  . Tobacco comment: Quit in 1991-03-20  Substance and Sexual Activity  . Alcohol use: No    Alcohol/week: 0.0 oz  . Drug use: No  . Sexual activity: Never    Birth control/protection: Surgical  Lifestyle  . Physical activity:    Days per week: Not on file    Minutes per session: Not on file  . Stress: Not on file  Relationships  . Social connections:    Talks on phone: Not on file    Gets together: Not on file    Attends religious service: Not on file    Active member of club or organization: Not on file    Attends meetings of clubs or organizations: Not on file    Relationship status: Not on file  Other Topics Concern  . Not on file  Social History Narrative   Lives with daughter   Caffeine use: none   Right handed    Widowed. Husband passed away Mar 19, 2016         Review of Systems: As mentioned in HPI   Physical Exam: BP 108/71   Pulse (!) 55   Temp (!) 97 F (36.1 C) (Oral)   Ht 5\' 2"   (1.575 m)   Wt 270 lb 9.6 oz (122.7 kg)   BMI 49.49 kg/m  General:   Alert and oriented. No distress noted. Pleasant and cooperative.  Head:  Normocephalic and atraumatic. Eyes:  Conjuctiva clear without scleral icterus. Mouth:  Oral mucosa pink and moist.  Abdomen:  +BS, soft, obese with large AP diameter, non-tender and non-distended. No rebound or guarding. No HSM or masses noted. Msk:  Symmetrical without gross deformities. Normal posture. Extremities:  Without edema. Neurologic:  Alert and  oriented x4 Psych:  Alert and cooperative. Normal mood and affect.  Lab Results  Component Value Date   ALT 16 02/16/2018   AST 19 02/16/2018   ALKPHOS 74 02/16/2018   BILITOT 1.1 02/16/2018

## 2018-05-22 NOTE — Progress Notes (Signed)
CC'D TO PCP °

## 2018-05-22 NOTE — Assessment & Plan Note (Signed)
77 year old female with chronic RUQ pain that has had multiple imaging modalities, EGD in April 2017 normal, MRI 2017 with fatty liver, normal biliary tree, normal pancreas, most recent CT March 2018 with fatty liver. Weight is increased and notes intermittent bloating. Constipation may be playing a role but does not seem significant and managed with occasional Miralax. Interestingly, she notes clay colored stool occasionally, dependent on food choices. She has also been seen at [facility] by Dr. Roney Mans who felt hepatic capsule distension associated with fatty liver was the culprit. She has not returned to see [facility] and is declining this. She has been extensively evaluated here, and thus far no occult process has been found. Clay colored stool is concerning but unclear etiology: LFTs have remained entirely normal and imaging unrevealing as noted. Will discuss with Dr. Gala Romney. In interim, add probiotic. No other alarm symptoms noted. Will have her return in 6 months.

## 2018-07-03 DIAGNOSIS — Z9181 History of falling: Secondary | ICD-10-CM | POA: Diagnosis not present

## 2018-07-03 DIAGNOSIS — G4733 Obstructive sleep apnea (adult) (pediatric): Secondary | ICD-10-CM | POA: Diagnosis not present

## 2018-07-03 DIAGNOSIS — G4734 Idiopathic sleep related nonobstructive alveolar hypoventilation: Secondary | ICD-10-CM | POA: Diagnosis not present

## 2018-07-03 DIAGNOSIS — I481 Persistent atrial fibrillation: Secondary | ICD-10-CM | POA: Diagnosis not present

## 2018-07-03 DIAGNOSIS — J449 Chronic obstructive pulmonary disease, unspecified: Secondary | ICD-10-CM | POA: Diagnosis not present

## 2018-07-14 ENCOUNTER — Ambulatory Visit: Payer: Medicare Other | Admitting: Neurology

## 2018-07-24 DIAGNOSIS — N3946 Mixed incontinence: Secondary | ICD-10-CM | POA: Diagnosis not present

## 2018-07-24 DIAGNOSIS — M109 Gout, unspecified: Secondary | ICD-10-CM | POA: Diagnosis not present

## 2018-07-24 DIAGNOSIS — Z6841 Body Mass Index (BMI) 40.0 and over, adult: Secondary | ICD-10-CM | POA: Diagnosis not present

## 2018-07-24 DIAGNOSIS — R5383 Other fatigue: Secondary | ICD-10-CM | POA: Diagnosis not present

## 2018-07-24 DIAGNOSIS — E559 Vitamin D deficiency, unspecified: Secondary | ICD-10-CM | POA: Diagnosis not present

## 2018-07-24 DIAGNOSIS — F330 Major depressive disorder, recurrent, mild: Secondary | ICD-10-CM | POA: Diagnosis not present

## 2018-07-24 DIAGNOSIS — I480 Paroxysmal atrial fibrillation: Secondary | ICD-10-CM | POA: Diagnosis not present

## 2018-08-06 DIAGNOSIS — M9903 Segmental and somatic dysfunction of lumbar region: Secondary | ICD-10-CM | POA: Diagnosis not present

## 2018-08-06 DIAGNOSIS — M47816 Spondylosis without myelopathy or radiculopathy, lumbar region: Secondary | ICD-10-CM | POA: Diagnosis not present

## 2018-08-08 DIAGNOSIS — M9903 Segmental and somatic dysfunction of lumbar region: Secondary | ICD-10-CM | POA: Diagnosis not present

## 2018-08-08 DIAGNOSIS — M47816 Spondylosis without myelopathy or radiculopathy, lumbar region: Secondary | ICD-10-CM | POA: Diagnosis not present

## 2018-08-11 DIAGNOSIS — M47816 Spondylosis without myelopathy or radiculopathy, lumbar region: Secondary | ICD-10-CM | POA: Diagnosis not present

## 2018-08-11 DIAGNOSIS — M9903 Segmental and somatic dysfunction of lumbar region: Secondary | ICD-10-CM | POA: Diagnosis not present

## 2018-08-12 DIAGNOSIS — N3946 Mixed incontinence: Secondary | ICD-10-CM | POA: Diagnosis not present

## 2018-08-12 DIAGNOSIS — M545 Low back pain: Secondary | ICD-10-CM | POA: Diagnosis not present

## 2018-08-12 DIAGNOSIS — I4891 Unspecified atrial fibrillation: Secondary | ICD-10-CM | POA: Diagnosis not present

## 2018-08-12 DIAGNOSIS — E669 Obesity, unspecified: Secondary | ICD-10-CM | POA: Diagnosis not present

## 2018-08-12 DIAGNOSIS — E039 Hypothyroidism, unspecified: Secondary | ICD-10-CM | POA: Diagnosis not present

## 2018-08-12 DIAGNOSIS — R202 Paresthesia of skin: Secondary | ICD-10-CM | POA: Diagnosis not present

## 2018-08-13 DIAGNOSIS — M47816 Spondylosis without myelopathy or radiculopathy, lumbar region: Secondary | ICD-10-CM | POA: Diagnosis not present

## 2018-08-13 DIAGNOSIS — M9903 Segmental and somatic dysfunction of lumbar region: Secondary | ICD-10-CM | POA: Diagnosis not present

## 2018-08-15 DIAGNOSIS — M9903 Segmental and somatic dysfunction of lumbar region: Secondary | ICD-10-CM | POA: Diagnosis not present

## 2018-08-15 DIAGNOSIS — M47816 Spondylosis without myelopathy or radiculopathy, lumbar region: Secondary | ICD-10-CM | POA: Diagnosis not present

## 2018-08-18 DIAGNOSIS — M9903 Segmental and somatic dysfunction of lumbar region: Secondary | ICD-10-CM | POA: Diagnosis not present

## 2018-08-18 DIAGNOSIS — M47816 Spondylosis without myelopathy or radiculopathy, lumbar region: Secondary | ICD-10-CM | POA: Diagnosis not present

## 2018-08-19 DIAGNOSIS — M47816 Spondylosis without myelopathy or radiculopathy, lumbar region: Secondary | ICD-10-CM | POA: Diagnosis not present

## 2018-08-19 DIAGNOSIS — I700 Atherosclerosis of aorta: Secondary | ICD-10-CM | POA: Diagnosis not present

## 2018-08-19 DIAGNOSIS — M545 Low back pain: Secondary | ICD-10-CM | POA: Diagnosis not present

## 2018-08-19 DIAGNOSIS — M160 Bilateral primary osteoarthritis of hip: Secondary | ICD-10-CM | POA: Diagnosis not present

## 2018-08-19 DIAGNOSIS — J439 Emphysema, unspecified: Secondary | ICD-10-CM | POA: Diagnosis not present

## 2018-08-19 DIAGNOSIS — M25552 Pain in left hip: Secondary | ICD-10-CM | POA: Diagnosis not present

## 2018-08-19 DIAGNOSIS — M48061 Spinal stenosis, lumbar region without neurogenic claudication: Secondary | ICD-10-CM | POA: Diagnosis not present

## 2018-08-19 DIAGNOSIS — M5432 Sciatica, left side: Secondary | ICD-10-CM | POA: Diagnosis not present

## 2018-08-19 DIAGNOSIS — Z6841 Body Mass Index (BMI) 40.0 and over, adult: Secondary | ICD-10-CM | POA: Diagnosis not present

## 2018-08-25 DIAGNOSIS — M9903 Segmental and somatic dysfunction of lumbar region: Secondary | ICD-10-CM | POA: Diagnosis not present

## 2018-08-25 DIAGNOSIS — M47816 Spondylosis without myelopathy or radiculopathy, lumbar region: Secondary | ICD-10-CM | POA: Diagnosis not present

## 2018-08-27 DIAGNOSIS — M6281 Muscle weakness (generalized): Secondary | ICD-10-CM | POA: Diagnosis not present

## 2018-08-27 DIAGNOSIS — N3946 Mixed incontinence: Secondary | ICD-10-CM | POA: Diagnosis not present

## 2018-09-02 DIAGNOSIS — M545 Low back pain: Secondary | ICD-10-CM | POA: Diagnosis not present

## 2018-09-02 DIAGNOSIS — Z23 Encounter for immunization: Secondary | ICD-10-CM | POA: Diagnosis not present

## 2018-09-02 DIAGNOSIS — R1084 Generalized abdominal pain: Secondary | ICD-10-CM | POA: Diagnosis not present

## 2018-09-02 DIAGNOSIS — M25552 Pain in left hip: Secondary | ICD-10-CM | POA: Diagnosis not present

## 2018-09-02 DIAGNOSIS — Z6841 Body Mass Index (BMI) 40.0 and over, adult: Secondary | ICD-10-CM | POA: Diagnosis not present

## 2018-09-02 DIAGNOSIS — M5432 Sciatica, left side: Secondary | ICD-10-CM | POA: Diagnosis not present

## 2018-09-18 DIAGNOSIS — I10 Essential (primary) hypertension: Secondary | ICD-10-CM | POA: Diagnosis not present

## 2018-09-18 DIAGNOSIS — Z9889 Other specified postprocedural states: Secondary | ICD-10-CM | POA: Diagnosis not present

## 2018-09-18 DIAGNOSIS — E662 Morbid (severe) obesity with alveolar hypoventilation: Secondary | ICD-10-CM | POA: Diagnosis not present

## 2018-09-18 DIAGNOSIS — I482 Chronic atrial fibrillation, unspecified: Secondary | ICD-10-CM | POA: Diagnosis not present

## 2018-10-09 DIAGNOSIS — H8103 Meniere's disease, bilateral: Secondary | ICD-10-CM | POA: Diagnosis not present

## 2018-10-09 DIAGNOSIS — R1084 Generalized abdominal pain: Secondary | ICD-10-CM | POA: Diagnosis not present

## 2018-10-09 DIAGNOSIS — I480 Paroxysmal atrial fibrillation: Secondary | ICD-10-CM | POA: Diagnosis not present

## 2018-10-09 DIAGNOSIS — Z6841 Body Mass Index (BMI) 40.0 and over, adult: Secondary | ICD-10-CM | POA: Diagnosis not present

## 2018-10-09 DIAGNOSIS — F330 Major depressive disorder, recurrent, mild: Secondary | ICD-10-CM | POA: Diagnosis not present

## 2018-10-15 DIAGNOSIS — Z87891 Personal history of nicotine dependence: Secondary | ICD-10-CM | POA: Diagnosis not present

## 2018-10-15 DIAGNOSIS — R072 Precordial pain: Secondary | ICD-10-CM | POA: Diagnosis not present

## 2018-10-15 DIAGNOSIS — I209 Angina pectoris, unspecified: Secondary | ICD-10-CM | POA: Diagnosis not present

## 2018-10-15 DIAGNOSIS — R0789 Other chest pain: Secondary | ICD-10-CM | POA: Diagnosis not present

## 2018-10-15 DIAGNOSIS — E876 Hypokalemia: Secondary | ICD-10-CM | POA: Diagnosis not present

## 2018-10-15 DIAGNOSIS — G4733 Obstructive sleep apnea (adult) (pediatric): Secondary | ICD-10-CM | POA: Diagnosis not present

## 2018-10-15 DIAGNOSIS — J449 Chronic obstructive pulmonary disease, unspecified: Secondary | ICD-10-CM | POA: Diagnosis not present

## 2018-10-15 DIAGNOSIS — R079 Chest pain, unspecified: Secondary | ICD-10-CM | POA: Diagnosis not present

## 2018-10-15 DIAGNOSIS — Z79899 Other long term (current) drug therapy: Secondary | ICD-10-CM | POA: Diagnosis not present

## 2018-10-15 DIAGNOSIS — Z888 Allergy status to other drugs, medicaments and biological substances status: Secondary | ICD-10-CM | POA: Diagnosis not present

## 2018-10-15 DIAGNOSIS — I4891 Unspecified atrial fibrillation: Secondary | ICD-10-CM | POA: Diagnosis not present

## 2018-10-15 DIAGNOSIS — E785 Hyperlipidemia, unspecified: Secondary | ICD-10-CM | POA: Diagnosis not present

## 2018-10-15 DIAGNOSIS — J9601 Acute respiratory failure with hypoxia: Secondary | ICD-10-CM | POA: Diagnosis not present

## 2018-10-15 DIAGNOSIS — Z885 Allergy status to narcotic agent status: Secondary | ICD-10-CM | POA: Diagnosis not present

## 2018-10-15 DIAGNOSIS — R0602 Shortness of breath: Secondary | ICD-10-CM | POA: Diagnosis not present

## 2018-10-15 DIAGNOSIS — Z7902 Long term (current) use of antithrombotics/antiplatelets: Secondary | ICD-10-CM | POA: Diagnosis not present

## 2018-10-15 DIAGNOSIS — E669 Obesity, unspecified: Secondary | ICD-10-CM | POA: Diagnosis not present

## 2018-10-16 DIAGNOSIS — E876 Hypokalemia: Secondary | ICD-10-CM | POA: Diagnosis not present

## 2018-10-16 DIAGNOSIS — R0689 Other abnormalities of breathing: Secondary | ICD-10-CM | POA: Diagnosis not present

## 2018-10-16 DIAGNOSIS — I480 Paroxysmal atrial fibrillation: Secondary | ICD-10-CM | POA: Diagnosis not present

## 2018-10-16 DIAGNOSIS — I209 Angina pectoris, unspecified: Secondary | ICD-10-CM | POA: Diagnosis not present

## 2018-10-16 DIAGNOSIS — R0789 Other chest pain: Secondary | ICD-10-CM | POA: Diagnosis not present

## 2018-10-16 DIAGNOSIS — R079 Chest pain, unspecified: Secondary | ICD-10-CM | POA: Diagnosis not present

## 2018-10-20 DIAGNOSIS — Z7901 Long term (current) use of anticoagulants: Secondary | ICD-10-CM | POA: Diagnosis not present

## 2018-10-20 DIAGNOSIS — R0609 Other forms of dyspnea: Secondary | ICD-10-CM | POA: Diagnosis not present

## 2018-10-20 DIAGNOSIS — G4733 Obstructive sleep apnea (adult) (pediatric): Secondary | ICD-10-CM | POA: Diagnosis not present

## 2018-10-20 DIAGNOSIS — I4819 Other persistent atrial fibrillation: Secondary | ICD-10-CM | POA: Diagnosis not present

## 2018-10-22 DIAGNOSIS — R0609 Other forms of dyspnea: Secondary | ICD-10-CM | POA: Diagnosis not present

## 2018-10-22 DIAGNOSIS — G4733 Obstructive sleep apnea (adult) (pediatric): Secondary | ICD-10-CM | POA: Diagnosis not present

## 2018-10-22 DIAGNOSIS — I4819 Other persistent atrial fibrillation: Secondary | ICD-10-CM | POA: Diagnosis not present

## 2018-10-22 DIAGNOSIS — Z7901 Long term (current) use of anticoagulants: Secondary | ICD-10-CM | POA: Diagnosis not present

## 2018-10-27 DIAGNOSIS — G4733 Obstructive sleep apnea (adult) (pediatric): Secondary | ICD-10-CM | POA: Diagnosis not present

## 2018-10-27 DIAGNOSIS — Z7901 Long term (current) use of anticoagulants: Secondary | ICD-10-CM | POA: Diagnosis not present

## 2018-10-27 DIAGNOSIS — I4819 Other persistent atrial fibrillation: Secondary | ICD-10-CM | POA: Diagnosis not present

## 2018-10-29 DIAGNOSIS — G4733 Obstructive sleep apnea (adult) (pediatric): Secondary | ICD-10-CM | POA: Diagnosis not present

## 2018-10-29 DIAGNOSIS — J449 Chronic obstructive pulmonary disease, unspecified: Secondary | ICD-10-CM | POA: Diagnosis not present

## 2018-10-29 DIAGNOSIS — I4819 Other persistent atrial fibrillation: Secondary | ICD-10-CM | POA: Diagnosis not present

## 2018-10-29 DIAGNOSIS — R0602 Shortness of breath: Secondary | ICD-10-CM | POA: Diagnosis not present

## 2018-11-06 DIAGNOSIS — Z7901 Long term (current) use of anticoagulants: Secondary | ICD-10-CM | POA: Diagnosis not present

## 2018-11-06 DIAGNOSIS — I4819 Other persistent atrial fibrillation: Secondary | ICD-10-CM | POA: Diagnosis not present

## 2018-11-06 DIAGNOSIS — R0609 Other forms of dyspnea: Secondary | ICD-10-CM | POA: Diagnosis not present

## 2018-11-06 DIAGNOSIS — G4733 Obstructive sleep apnea (adult) (pediatric): Secondary | ICD-10-CM | POA: Diagnosis not present

## 2018-11-07 DIAGNOSIS — I4811 Longstanding persistent atrial fibrillation: Secondary | ICD-10-CM | POA: Diagnosis not present

## 2018-11-07 DIAGNOSIS — Z01818 Encounter for other preprocedural examination: Secondary | ICD-10-CM | POA: Diagnosis not present

## 2018-11-12 ENCOUNTER — Ambulatory Visit: Payer: Medicare Other | Admitting: Gastroenterology

## 2018-11-12 DIAGNOSIS — G4733 Obstructive sleep apnea (adult) (pediatric): Secondary | ICD-10-CM | POA: Diagnosis not present

## 2018-11-12 DIAGNOSIS — R0609 Other forms of dyspnea: Secondary | ICD-10-CM | POA: Diagnosis not present

## 2018-11-12 DIAGNOSIS — I4811 Longstanding persistent atrial fibrillation: Secondary | ICD-10-CM | POA: Diagnosis not present

## 2018-11-12 DIAGNOSIS — R0789 Other chest pain: Secondary | ICD-10-CM | POA: Diagnosis not present

## 2018-11-12 DIAGNOSIS — Z87891 Personal history of nicotine dependence: Secondary | ICD-10-CM | POA: Diagnosis not present

## 2018-11-12 DIAGNOSIS — I4891 Unspecified atrial fibrillation: Secondary | ICD-10-CM | POA: Diagnosis not present

## 2018-11-12 DIAGNOSIS — J449 Chronic obstructive pulmonary disease, unspecified: Secondary | ICD-10-CM | POA: Diagnosis not present

## 2018-11-12 DIAGNOSIS — G4730 Sleep apnea, unspecified: Secondary | ICD-10-CM | POA: Diagnosis not present

## 2018-11-12 DIAGNOSIS — Z7901 Long term (current) use of anticoagulants: Secondary | ICD-10-CM | POA: Diagnosis not present

## 2018-11-12 DIAGNOSIS — Z6841 Body Mass Index (BMI) 40.0 and over, adult: Secondary | ICD-10-CM | POA: Diagnosis not present

## 2018-11-12 DIAGNOSIS — I4819 Other persistent atrial fibrillation: Secondary | ICD-10-CM | POA: Diagnosis not present

## 2018-11-15 DIAGNOSIS — R5383 Other fatigue: Secondary | ICD-10-CM | POA: Diagnosis not present

## 2018-11-15 DIAGNOSIS — R0602 Shortness of breath: Secondary | ICD-10-CM | POA: Diagnosis not present

## 2018-11-15 DIAGNOSIS — Z6841 Body Mass Index (BMI) 40.0 and over, adult: Secondary | ICD-10-CM | POA: Diagnosis not present

## 2018-11-15 DIAGNOSIS — I482 Chronic atrial fibrillation, unspecified: Secondary | ICD-10-CM | POA: Diagnosis not present

## 2018-11-15 DIAGNOSIS — G4730 Sleep apnea, unspecified: Secondary | ICD-10-CM | POA: Diagnosis not present

## 2018-12-03 DIAGNOSIS — F330 Major depressive disorder, recurrent, mild: Secondary | ICD-10-CM | POA: Diagnosis not present

## 2018-12-03 DIAGNOSIS — R1084 Generalized abdominal pain: Secondary | ICD-10-CM | POA: Diagnosis not present

## 2018-12-03 DIAGNOSIS — J019 Acute sinusitis, unspecified: Secondary | ICD-10-CM | POA: Diagnosis not present

## 2018-12-03 DIAGNOSIS — I480 Paroxysmal atrial fibrillation: Secondary | ICD-10-CM | POA: Diagnosis not present

## 2018-12-03 DIAGNOSIS — Z6841 Body Mass Index (BMI) 40.0 and over, adult: Secondary | ICD-10-CM | POA: Diagnosis not present

## 2018-12-04 DIAGNOSIS — Z7901 Long term (current) use of anticoagulants: Secondary | ICD-10-CM | POA: Diagnosis not present

## 2018-12-04 DIAGNOSIS — G4733 Obstructive sleep apnea (adult) (pediatric): Secondary | ICD-10-CM | POA: Diagnosis not present

## 2018-12-04 DIAGNOSIS — I4819 Other persistent atrial fibrillation: Secondary | ICD-10-CM | POA: Diagnosis not present

## 2018-12-04 DIAGNOSIS — R0609 Other forms of dyspnea: Secondary | ICD-10-CM | POA: Diagnosis not present

## 2018-12-10 DIAGNOSIS — R1011 Right upper quadrant pain: Secondary | ICD-10-CM | POA: Diagnosis not present

## 2018-12-10 DIAGNOSIS — R1013 Epigastric pain: Secondary | ICD-10-CM | POA: Diagnosis not present

## 2018-12-16 DIAGNOSIS — R1013 Epigastric pain: Secondary | ICD-10-CM | POA: Diagnosis not present

## 2018-12-16 DIAGNOSIS — R1011 Right upper quadrant pain: Secondary | ICD-10-CM | POA: Diagnosis not present

## 2018-12-16 DIAGNOSIS — K3189 Other diseases of stomach and duodenum: Secondary | ICD-10-CM | POA: Diagnosis not present

## 2018-12-16 DIAGNOSIS — K319 Disease of stomach and duodenum, unspecified: Secondary | ICD-10-CM | POA: Diagnosis not present

## 2018-12-29 ENCOUNTER — Emergency Department (HOSPITAL_BASED_OUTPATIENT_CLINIC_OR_DEPARTMENT_OTHER): Payer: Medicare Other

## 2018-12-29 ENCOUNTER — Encounter (HOSPITAL_BASED_OUTPATIENT_CLINIC_OR_DEPARTMENT_OTHER): Payer: Self-pay

## 2018-12-29 ENCOUNTER — Other Ambulatory Visit: Payer: Self-pay

## 2018-12-29 ENCOUNTER — Emergency Department (HOSPITAL_BASED_OUTPATIENT_CLINIC_OR_DEPARTMENT_OTHER)
Admission: EM | Admit: 2018-12-29 | Discharge: 2018-12-29 | Disposition: A | Discharge status: Home / Self Care | Payer: Medicare Other | Attending: Emergency Medicine | Admitting: Emergency Medicine

## 2018-12-29 DIAGNOSIS — Z7982 Long term (current) use of aspirin: Secondary | ICD-10-CM | POA: Diagnosis not present

## 2018-12-29 DIAGNOSIS — K5730 Diverticulosis of large intestine without perforation or abscess without bleeding: Secondary | ICD-10-CM | POA: Diagnosis not present

## 2018-12-29 DIAGNOSIS — J45909 Unspecified asthma, uncomplicated: Secondary | ICD-10-CM | POA: Insufficient documentation

## 2018-12-29 DIAGNOSIS — I5030 Unspecified diastolic (congestive) heart failure: Secondary | ICD-10-CM | POA: Diagnosis not present

## 2018-12-29 DIAGNOSIS — Z87891 Personal history of nicotine dependence: Secondary | ICD-10-CM | POA: Insufficient documentation

## 2018-12-29 DIAGNOSIS — R1011 Right upper quadrant pain: Secondary | ICD-10-CM | POA: Insufficient documentation

## 2018-12-29 DIAGNOSIS — R109 Unspecified abdominal pain: Secondary | ICD-10-CM

## 2018-12-29 DIAGNOSIS — Z79899 Other long term (current) drug therapy: Secondary | ICD-10-CM | POA: Insufficient documentation

## 2018-12-29 DIAGNOSIS — E039 Hypothyroidism, unspecified: Secondary | ICD-10-CM | POA: Insufficient documentation

## 2018-12-29 DIAGNOSIS — G8929 Other chronic pain: Secondary | ICD-10-CM | POA: Diagnosis not present

## 2018-12-29 DIAGNOSIS — K449 Diaphragmatic hernia without obstruction or gangrene: Secondary | ICD-10-CM | POA: Diagnosis not present

## 2018-12-29 DIAGNOSIS — Z7901 Long term (current) use of anticoagulants: Secondary | ICD-10-CM | POA: Diagnosis not present

## 2018-12-29 HISTORY — DX: Unspecified atrial fibrillation: I48.91

## 2018-12-29 LAB — URINALYSIS, ROUTINE W REFLEX MICROSCOPIC
Bilirubin Urine: NEGATIVE
Glucose, UA: NEGATIVE mg/dL
Hgb urine dipstick: NEGATIVE
Ketones, ur: NEGATIVE mg/dL
LEUKOCYTES UA: NEGATIVE
Nitrite: NEGATIVE
PH: 6 (ref 5.0–8.0)
Protein, ur: NEGATIVE mg/dL
Specific Gravity, Urine: 1.015 (ref 1.005–1.030)

## 2018-12-29 LAB — COMPREHENSIVE METABOLIC PANEL
ALT: 15 U/L (ref 0–44)
AST: 17 U/L (ref 15–41)
Albumin: 3.8 g/dL (ref 3.5–5.0)
Alkaline Phosphatase: 77 U/L (ref 38–126)
Anion gap: 6 (ref 5–15)
BUN: 16 mg/dL (ref 8–23)
CHLORIDE: 106 mmol/L (ref 98–111)
CO2: 27 mmol/L (ref 22–32)
Calcium: 9.3 mg/dL (ref 8.9–10.3)
Creatinine, Ser: 0.91 mg/dL (ref 0.44–1.00)
Glucose, Bld: 89 mg/dL (ref 70–99)
POTASSIUM: 4.1 mmol/L (ref 3.5–5.1)
Sodium: 139 mmol/L (ref 135–145)
TOTAL PROTEIN: 7.1 g/dL (ref 6.5–8.1)
Total Bilirubin: 0.5 mg/dL (ref 0.3–1.2)

## 2018-12-29 LAB — LIPASE, BLOOD: Lipase: 43 U/L (ref 11–51)

## 2018-12-29 LAB — CBC WITH DIFFERENTIAL/PLATELET
Abs Immature Granulocytes: 0.02 10*3/uL (ref 0.00–0.07)
BASOS PCT: 1 %
Basophils Absolute: 0.1 10*3/uL (ref 0.0–0.1)
EOS ABS: 0.5 10*3/uL (ref 0.0–0.5)
EOS PCT: 7 %
HEMATOCRIT: 45.8 % (ref 36.0–46.0)
Hemoglobin: 14.6 g/dL (ref 12.0–15.0)
IMMATURE GRANULOCYTES: 0 %
LYMPHS ABS: 1.4 10*3/uL (ref 0.7–4.0)
Lymphocytes Relative: 20 %
MCH: 29.2 pg (ref 26.0–34.0)
MCHC: 31.9 g/dL (ref 30.0–36.0)
MCV: 91.6 fL (ref 80.0–100.0)
MONO ABS: 0.7 10*3/uL (ref 0.1–1.0)
MONOS PCT: 9 %
NEUTROS PCT: 63 %
Neutro Abs: 4.4 10*3/uL (ref 1.7–7.7)
PLATELETS: 257 10*3/uL (ref 150–400)
RBC: 5 MIL/uL (ref 3.87–5.11)
RDW: 11.9 % (ref 11.5–15.5)
WBC: 7 10*3/uL (ref 4.0–10.5)
nRBC: 0 % (ref 0.0–0.2)

## 2018-12-29 MED ORDER — KETOROLAC TROMETHAMINE 15 MG/ML IJ SOLN
15.0000 mg | Freq: Once | INTRAMUSCULAR | Status: AC
  Administered 2018-12-29: 15 mg via INTRAVENOUS
  Filled 2018-12-29: qty 1

## 2018-12-29 MED ORDER — SODIUM CHLORIDE 0.9 % IV BOLUS
500.0000 mL | Freq: Once | INTRAVENOUS | Status: AC
  Administered 2018-12-29: 500 mL via INTRAVENOUS

## 2018-12-29 NOTE — ED Provider Notes (Signed)
[facility] EMERGENCY DEPARTMENT Provider Note   CSN: [id number] Arrival date & time: 12/29/18  1113     History   Chief Complaint Chief Complaint  Patient presents with  . Flank Pain    HPI [name] is a 78 y.o. female.  HPI   Has had flank pain for a while, 4-6 weeks, was not as severe, gradually worsening and this morning was severe/excrutiating pain.  Worse with moving.  Sharp pain.  Over kidney.  No fevers. Is having chills.  No dysuria. No nausea, vomiting or diarrhea. No black or bloody stools.  Reports chronic right sided abdominal pain for which she has seen Gastroenterology and had multiple studies, she reports she has been told she has phantom biliary/GB pain.     Past Medical History:  Diagnosis Date  . A-fib ([place])   . Allergic rhinitis   . Anxiety   . Asthma    pt states she "believe it is more allergies"  . Cholecystitis   . Chronic abdominal pain   . Colon polyp    TCS 2005 TICS [place], TCS/PROPOFOL 2011 SIMPLE [place]OM[place]  . Depression   . Diverticulosis 2005  . Fatty liver   . Fibromyalgia   . GERD (gastroesophageal reflux disease)   . Gout   . Hyperlipemia   . Hypothyroidism   . Internal hemorrhoids    H/o Rectal bleeding secondary to internal hemorrhoids 04/2010.  Marland Kitchen MCI (mild cognitive impairment) 12/30/2017  . Migraine   . Narcolepsy   . Obesity, morbid (more than 100 lbs over ideal weight or BMI > 40) (HCC)   . Obstructive sleep apnea on CPAP and O2  . Osteoarthritis   . Persistent atrial fibrillation    a. s/p DCCV 01/2012  . Volume overload    uses torsemide PRN    Patient Active Problem List   Diagnosis Date Noted  . Bilateral sensorineural hearing loss 03/10/2018  . Tinnitus 03/10/2018  . Chronic anticoagulation 02/04/2018  . Persistent atrial fibrillation 02/04/2018  . Morbid obesity due to excess calories ([place]) 02/04/2018  . MCI (mild cognitive impairment) 12/30/2017  . Hepatic steatosis 12/31/2016  . Chronic RUQ pain  12/31/2016  . History of colonic polyps   . Diverticulosis of colon without hemorrhage   . Hiatal hernia   . RUQ abdominal pain 02/27/2016  . Dyspnea 05/03/2014  . Acute respiratory failure ([place]) 01/13/2014  . CAP (community acquired pneumonia) 01/12/2014  . RUQ pain 02/05/2013  . Rib pain on right side 02/05/2013  . Cholelithiasis 09/01/2012  . Atrial fibrillation ([place]) 09/01/2012  . Diastolic CHF ([place]) 25/85/2778  . INTERNAL HEMORRHOIDS 11/06/2010  . FATIGUE 11/06/2010  . Abdominal pain 11/06/2010  . INTERTRIGO, CANDIDAL 06/03/2009  . VARICOSE VEINS LOWER EXTREMITIES W/INFLAMMATION 01/10/2009  . HYPOTHYROIDISM 11/30/2006  . HYPERLIPIDEMIA 11/30/2006  . OBESITY, MORBID 11/30/2006  . ANXIETY 11/30/2006  . DEPRESSION 11/30/2006  . OBSTRUCTIVE SLEEP APNEA 11/30/2006  . Common migraine 11/30/2006  . NARCOLEPSY W/CATAPLEXY 11/30/2006  . ALLERGIC RHINITIS 11/30/2006  . ASTHMA 11/30/2006  . GERD 11/30/2006  . OSTEOARTHRITIS 11/30/2006  . FIBROMYALGIA 11/30/2006  . PALPITATIONS 11/30/2006  . COLONIC POLYPS, HX OF 11/30/2006    Past Surgical History:  Procedure Laterality Date  . ABDOMINAL HYSTERECTOMY  FIBROIDS 1982  . BLADDER SUSPENSION  1982  . CARDIOVERSION  02/05/2012   Procedure: CARDIOVERSION;  Surgeon: Laverda Page, MD;  Location: [facility];  Service: Cardiovascular;  Laterality: N/A;  . CARDIOVERSION N/A 06/09/2013   Procedure:  CARDIOVERSION;  Surgeon: Laverda Page, MD;  Location: [facility];  Service: Cardiovascular;  Laterality: N/A;  h&p in file-[facility]  . CARDIOVERSION N/A 08/09/2015   Procedure: CARDIOVERSION;  Surgeon: Adrian Prows, MD;  Location: [facility] ENDOSCOPY;  Service: Cardiovascular;  Laterality: N/A;  . CARDIOVERSION N/A 09/13/2015   Procedure: CARDIOVERSION;  Surgeon: Adrian Prows, MD;  Location: [facility] ENDOSCOPY;  Service: Cardiovascular;  Laterality: N/A;  . CHOLECYSTECTOMY  09/03/2012   Procedure: LAPAROSCOPIC CHOLECYSTECTOMY;  Surgeon: Jamesetta So, MD;   Location: [facility];  Service: General;  Laterality: N/A;  . COLONOSCOPY  2005   Dr. Tamala Julian: sigmoid diverticulosis  . COLONOSCOPY  June 2011   Dr. Oneida Alar: internal hemorrhoids, simple adenomas, hyperplastic polyps, needs surveillance in June 2014 with overtube and Propofol  . COLONOSCOPY WITH PROPOFOL N/A 03/19/2016   Dr. Gala Romney: multiple small tubular adenomas, diverticulosis. Surveillance in 5 years   . ERCP  10/01/2012   Procedure: ENDOSCOPIC RETROGRADE CHOLANGIOPANCREATOGRAPHY (ERCP);  Surgeon: Daneil Dolin, MD;  Location: [facility];  Service: Endoscopy;  Laterality: N/A;  . ESOPHAGOGASTRODUODENOSCOPY (EGD) WITH PROPOFOL N/A 03/19/2016   DR. Rourk: normal esohpagus with small hiatal hernia   . Kidney Stent  1995  . POLYPECTOMY  03/19/2016   Procedure: POLYPECTOMY;  Surgeon: Daneil Dolin, MD;  [facility];  Service: Endoscopy;;  cold snares  . REMOVAL OF STONES  10/01/2012   Procedure: REMOVAL OF STONES;  Surgeon: Daneil Dolin, MD;  Location: [facility];  Service: Endoscopy;  Laterality: N/A;  . RHINOPLASTY    . SPHINCTEROTOMY  10/01/2012   Procedure: SPHINCTEROTOMY;  Surgeon: Daneil Dolin, MD;  Location: [facility];  Service: Endoscopy;  Laterality: N/A;     OB History   No obstetric history on file.      Home Medications    Prior to Admission medications   Medication Sig Start Date End Date Taking? Authorizing Provider  albuterol (PROVENTIL) (5 MG/ML) 0.5% nebulizer solution Take 0.5 mLs (2.5 mg total) by nebulization every 6 (six) hours as needed for wheezing or shortness of breath. 02/16/18   Isla Pence, MD  albuterol (PROVENTIL,VENTOLIN) 90 MCG/ACT inhaler Inhale 2 puffs into the lungs every 6 (six) hours as needed for wheezing or shortness of breath.     [provider]  allopurinol (ZYLOPRIM) 100 MG tablet Take 1 tablet by mouth daily. 10/17/17   [provider]  apixaban (ELIQUIS) 5 MG TABS tablet Take 5 mg by mouth 2 (two) times daily.    [provider]  Ergocalciferol (VITAMIN D2) 2000 units TABS Take 1 tablet by mouth daily.    [provider]  esomeprazole (NEXIUM) 40 MG capsule Take 40 mg by mouth every evening.     [provider]  fluticasone (FLONASE) 50 MCG/ACT nasal spray Place 2 sprays into both nostrils as needed for allergies or rhinitis.     [provider]  levothyroxine (SYNTHROID, LEVOTHROID) 50 MCG tablet Take 50 mcg by mouth daily before breakfast.    [provider]  LORazepam (ATIVAN) 0.5 MG tablet Take 1.5 mg by mouth 3 (three) times daily.     [provider]  miconazole (MICOTIN) 2 % cream Apply 1 application topically as needed.    [provider]  nystatin-triamcinolone ointment (MYCOLOG) Apply 1 application topically as needed. 10/29/17   [provider]  Polyethyl Glycol-Propyl Glycol (SYSTANE OP) Apply 2 drops to eye as needed. In both eyes    [provider]  propranolol (INDERAL) 40 MG tablet Take 40 mg by mouth daily. 03/03/18   [provider]    Family History Family History  Problem Relation Age of Onset  . Colon cancer Mother 67  . Diabetes Mother   . Asthma Mother   . Heart disease Mother   . Cerebral aneurysm Father   . Asthma Maternal Aunt   . Asthma Brother   . Heart disease Maternal Grandfather   . Heart disease Maternal Grandmother     Social History Social History   Tobacco Use  . Smoking status: Former Smoker    Packs/day: 0.50    Years: 30.00    Pack years: 15.00    Last attempt to quit: 10/03/1989    Years since quitting: 29.2  . Smokeless tobacco: Never Used  . Tobacco comment: Quit in 1992  Substance Use Topics  . Alcohol use: No    Alcohol/week: 0.0 standard drinks  . Drug use: No     Allergies   Meperidine; Atorvastatin; Febuxostat; Simvastatin; Statins; and Zocor [simvastatin - high dose]   Review of Systems Review of Systems  Constitutional: Positive for fatigue. Negative  for fever.  HENT: Negative for sore throat.   Eyes: Negative for visual disturbance.  Respiratory: Negative for cough and shortness of breath.   Cardiovascular: Negative for chest pain.  Gastrointestinal: Negative for abdominal pain, nausea and vomiting.  Genitourinary: Positive for flank pain. Negative for difficulty urinating.  Musculoskeletal: Positive for back pain.  Skin: Negative for rash.  Neurological: Negative for syncope, weakness (generalized), numbness and headaches.     Physical Exam Updated Vital Signs BP 104/69   Pulse 65   Temp 98 F (36.7 C) (Oral)   Resp 18   Ht 5\' 2"  (1.575 m)   Wt 122 kg   SpO2 95%   BMI 49.20 kg/m   Physical Exam Vitals signs and nursing note reviewed.  Constitutional:      General: She is not in acute distress.    Appearance: She is well-developed. She is not diaphoretic.  HENT:     Head: Normocephalic and atraumatic.  Eyes:     Conjunctiva/sclera: Conjunctivae normal.  Neck:     Musculoskeletal: Normal range of motion.  Cardiovascular:     Rate and Rhythm: Normal rate and regular rhythm.     Heart sounds: Normal heart sounds. No murmur. No friction rub. No gallop.   Pulmonary:     Effort: Pulmonary effort is normal. No respiratory distress.     Breath sounds: Normal breath sounds. No wheezing or rales.  Abdominal:     General: There is no distension.     Palpations: Abdomen is soft.     Tenderness: There is abdominal tenderness (right flank, RUQ). There is no guarding.  Musculoskeletal:        General: No tenderness.  Skin:    General: Skin is warm and dry.     Findings: No erythema or rash.  Neurological:     Mental Status: She is alert and oriented to person, place, and time.      ED Treatments / Results  Labs (all labs ordered are listed, but only abnormal results are displayed) Labs Reviewed  URINALYSIS, ROUTINE W REFLEX MICROSCOPIC - Abnormal; Notable for the following components:      Result Value    APPearance HAZY (*)    All other components within normal limits  URINE CULTURE  CBC WITH DIFFERENTIAL/PLATELET  COMPREHENSIVE METABOLIC PANEL  LIPASE, BLOOD  EKG None  Radiology Ct Renal Stone Study  Result Date: 12/29/2018 CLINICAL DATA:  Right flank pain for 6 weeks. EXAM: CT ABDOMEN AND PELVIS WITHOUT CONTRAST TECHNIQUE: Multidetector CT imaging of the abdomen and pelvis was performed following the standard protocol without IV contrast. COMPARISON:  CT abdomen and pelvis 02/14/2017 and 02/29/2016. FINDINGS: Lower chest: Lung bases are clear. No pleural or pericardial effusion. Hepatobiliary: The patient is status post cholecystectomy. There is some pneumobilia which is seen on the 2017 CT and consistent with prior sphincterotomy. No biliary dilatation. Pancreas: Unremarkable. No pancreatic ductal dilatation or surrounding inflammatory changes. Spleen: Scattered calcifications in the spleen are consistent with old granulomatous disease. The spleen is normal in size. Adrenals/Urinary Tract: No urinary tract stones are identified. No hydronephrosis on the right or left. Ureters and urinary bladder appear normal. Adrenal glands are unremarkable. Stomach/Bowel: Scattered diverticulosis without diverticulitis is most notable in the sigmoid colon. Small hiatal hernia is noted. The stomach is otherwise unremarkable. Small bowel and appendix appear normal. Vascular/Lymphatic: Aortic atherosclerosis. No enlarged abdominal or pelvic lymph nodes. Reproductive: Status post hysterectomy. No adnexal masses. Other: None. Musculoskeletal: No acute or focal abnormality. IMPRESSION: Negative for urinary tract stone. No acute abnormality abdomen or pelvis. Pneumobilia as seen on prior CT is consistent with prior sphincterotomy. Diverticulosis without diverticulitis. Atherosclerosis. Small hiatal hernia. Electronically Signed   By: Inge Rise M.D.   On: 12/29/2018 14:11    Procedures Procedures (including  critical care time)  Medications Ordered in ED Medications  ketorolac (TORADOL) 15 MG/ML injection 15 mg (15 mg Intravenous Given 12/29/18 1331)  sodium chloride 0.9 % bolus 500 mL ( Intravenous Stopped 12/29/18 1440)     Initial Impression / Assessment and Plan / ED Course  I have reviewed the triage vital signs and the nursing notes.  Pertinent labs & imaging results that were available during my care of the patient were reviewed by me and considered in my medical decision making (see chart for details).     78yo female with history above including chronic RUQ abdominal pain with multiple studies and evaluations with different Gastroenterology groups who presents with concern for right flank pain.  Labs show no leukocytosis, normal hgb, normal renal function and transaminases, normal lipase. No signs of UTI. CT stone study with pneumobilia which has been seen on prior CT.  Doubt dissection, mesenteric ischemia. Suspect exacerbation of chronic pain. Recommend continued GI follow up. Patient discharged in stable condition with understanding of reasons to return.   Final Clinical Impressions(s) / ED Diagnoses   Final diagnoses:  Right flank pain    ED Discharge Orders    None       Gareth Morgan, MD 12/30/18 [phone]

## 2018-12-29 NOTE — ED Notes (Signed)
Blood sent to lab for hold.

## 2018-12-29 NOTE — ED Triage Notes (Signed)
C/o right flank pain x 6 weeks-states she has seen by PCP and GI but flank pain was not addressed-was seen for abd pain that started 3.5 years ago-pt NAD-to triage in w/c

## 2018-12-30 LAB — URINE CULTURE

## 2019-01-29 DIAGNOSIS — I4819 Other persistent atrial fibrillation: Secondary | ICD-10-CM | POA: Diagnosis not present

## 2019-01-29 DIAGNOSIS — G4733 Obstructive sleep apnea (adult) (pediatric): Secondary | ICD-10-CM | POA: Diagnosis not present

## 2019-01-29 DIAGNOSIS — Z7901 Long term (current) use of anticoagulants: Secondary | ICD-10-CM | POA: Diagnosis not present

## 2019-04-02 DIAGNOSIS — J309 Allergic rhinitis, unspecified: Secondary | ICD-10-CM | POA: Diagnosis not present

## 2019-04-02 DIAGNOSIS — F330 Major depressive disorder, recurrent, mild: Secondary | ICD-10-CM | POA: Diagnosis not present

## 2019-04-02 DIAGNOSIS — I480 Paroxysmal atrial fibrillation: Secondary | ICD-10-CM | POA: Diagnosis not present

## 2019-04-02 DIAGNOSIS — Z6841 Body Mass Index (BMI) 40.0 and over, adult: Secondary | ICD-10-CM | POA: Diagnosis not present

## 2019-05-06 DIAGNOSIS — Z86010 Personal history of colonic polyps: Secondary | ICD-10-CM | POA: Diagnosis not present

## 2019-05-06 DIAGNOSIS — R1013 Epigastric pain: Secondary | ICD-10-CM | POA: Diagnosis not present

## 2019-05-06 DIAGNOSIS — K219 Gastro-esophageal reflux disease without esophagitis: Secondary | ICD-10-CM | POA: Diagnosis not present

## 2019-05-06 DIAGNOSIS — R1011 Right upper quadrant pain: Secondary | ICD-10-CM | POA: Diagnosis not present

## 2019-05-12 DIAGNOSIS — K5730 Diverticulosis of large intestine without perforation or abscess without bleeding: Secondary | ICD-10-CM | POA: Diagnosis not present

## 2019-05-12 DIAGNOSIS — Z86010 Personal history of colonic polyps: Secondary | ICD-10-CM | POA: Diagnosis not present

## 2019-07-20 DIAGNOSIS — I480 Paroxysmal atrial fibrillation: Secondary | ICD-10-CM | POA: Diagnosis not present

## 2019-07-20 DIAGNOSIS — M545 Low back pain: Secondary | ICD-10-CM | POA: Diagnosis not present

## 2019-07-20 DIAGNOSIS — R1084 Generalized abdominal pain: Secondary | ICD-10-CM | POA: Diagnosis not present

## 2019-07-20 DIAGNOSIS — N3946 Mixed incontinence: Secondary | ICD-10-CM | POA: Diagnosis not present

## 2019-07-20 DIAGNOSIS — J4532 Mild persistent asthma with status asthmaticus: Secondary | ICD-10-CM | POA: Diagnosis not present

## 2019-07-20 DIAGNOSIS — R0602 Shortness of breath: Secondary | ICD-10-CM | POA: Diagnosis not present

## 2019-07-20 DIAGNOSIS — Z6841 Body Mass Index (BMI) 40.0 and over, adult: Secondary | ICD-10-CM | POA: Diagnosis not present

## 2019-08-21 DIAGNOSIS — R32 Unspecified urinary incontinence: Secondary | ICD-10-CM | POA: Diagnosis not present

## 2019-08-21 DIAGNOSIS — N3281 Overactive bladder: Secondary | ICD-10-CM | POA: Diagnosis not present

## 2019-08-26 DIAGNOSIS — F331 Major depressive disorder, recurrent, moderate: Secondary | ICD-10-CM | POA: Diagnosis not present

## 2019-08-26 DIAGNOSIS — I480 Paroxysmal atrial fibrillation: Secondary | ICD-10-CM | POA: Diagnosis not present

## 2019-09-04 DIAGNOSIS — N3281 Overactive bladder: Secondary | ICD-10-CM | POA: Diagnosis not present

## 2019-09-04 DIAGNOSIS — R32 Unspecified urinary incontinence: Secondary | ICD-10-CM | POA: Diagnosis not present

## 2019-09-17 ENCOUNTER — Encounter: Payer: Self-pay | Admitting: Cardiology

## 2019-09-17 ENCOUNTER — Other Ambulatory Visit (HOSPITAL_COMMUNITY): Payer: Self-pay | Admitting: Family Medicine

## 2019-09-17 ENCOUNTER — Ambulatory Visit (HOSPITAL_COMMUNITY)
Admission: RE | Admit: 2019-09-17 | Discharge: 2019-09-17 | Disposition: A | Discharge status: Home / Self Care | Payer: Medicare Other | Source: Ambulatory Visit | Attending: Family Medicine | Admitting: Family Medicine

## 2019-09-17 ENCOUNTER — Ambulatory Visit (INDEPENDENT_AMBULATORY_CARE_PROVIDER_SITE_OTHER): Payer: Medicare Other | Admitting: Cardiology

## 2019-09-17 ENCOUNTER — Other Ambulatory Visit: Payer: Self-pay

## 2019-09-17 VITALS — BP 121/73 | HR 77 | Temp 96.5°F | Ht 62.0 in | Wt 317.0 lb

## 2019-09-17 DIAGNOSIS — J4532 Mild persistent asthma with status asthmaticus: Secondary | ICD-10-CM | POA: Diagnosis not present

## 2019-09-17 DIAGNOSIS — Z6841 Body Mass Index (BMI) 40.0 and over, adult: Secondary | ICD-10-CM | POA: Diagnosis not present

## 2019-09-17 DIAGNOSIS — I4819 Other persistent atrial fibrillation: Secondary | ICD-10-CM | POA: Diagnosis not present

## 2019-09-17 DIAGNOSIS — M545 Low back pain, unspecified: Secondary | ICD-10-CM

## 2019-09-17 DIAGNOSIS — R350 Frequency of micturition: Secondary | ICD-10-CM | POA: Diagnosis not present

## 2019-09-17 DIAGNOSIS — I5033 Acute on chronic diastolic (congestive) heart failure: Secondary | ICD-10-CM | POA: Diagnosis not present

## 2019-09-17 MED ORDER — FUROSEMIDE 40 MG PO TABS: 40.0000 mg | ORAL_TABLET | Freq: Two times a day (BID) | ORAL | 2 refills | Status: DC

## 2019-09-17 MED ORDER — SPIRONOLACTONE 25 MG PO TABS: 25.0000 mg | ORAL_TABLET | Freq: Every day | ORAL | 2 refills | Status: DC

## 2019-09-17 MED ORDER — METOPROLOL SUCCINATE ER 100 MG PO TB24: 100.0000 mg | ORAL_TABLET | Freq: Every day | ORAL | 1 refills | Status: DC

## 2019-09-17 NOTE — Progress Notes (Signed)
Primary Physician/Referring:  Denny Levy, PA  Patient ID: [name], female    DOB: [birth year], 78 y.o.   MRN: [id number]  Chief Complaint  Patient presents with  . Atrial Fibrillation  . Follow-up    1 year   HPI:    [name]  is a 77 y.o. Caucasian female with persistent atrial fibrillation, morbid obesity, depression, chronic diastolic CHF is here for annual visit. She had maintained sinus on Amiodarone, but felt poorly and discontinued this and was back in A. Fib.   Patient has gained significant amount of weight, Her other significant history includes hypertension, frequency and urgency of urination that is chronic, very sensitive to medications.  This is an annual visit. She continues to have upper abdominal discomfort and has had extensive evaluation and no etiology has been found. Over the past 3 months, she has noticed worsening dyspnea. Has developed worsening leg edema and worsening fatigue.   Past Medical History:  Diagnosis Date  . A-fib ([place])   . Allergic rhinitis   . Anxiety   . Asthma    pt states she "believe it is more allergies"  . Cholecystitis   . Chronic abdominal pain   . Colon polyp    TCS 2005 TICS [place], TCS/PROPOFOL 2011 SIMPLE [place]  . Depression   . Diverticulosis 2005  . Fatty liver   . Fibromyalgia   . GERD (gastroesophageal reflux disease)   . Gout   . Hyperlipemia   . Hypothyroidism   . Internal hemorrhoids    H/o Rectal bleeding secondary to internal hemorrhoids 04/2010.  Marland Kitchen MCI (mild cognitive impairment) 12/30/2017  . Migraine   . Narcolepsy   . Obesity, morbid (more than 100 lbs over ideal weight or BMI > 40) (HCC)   . Obstructive sleep apnea on CPAP and O2  . Osteoarthritis   . Persistent atrial fibrillation ([place])    a. s/p DCCV 01/2012  . Volume overload    uses torsemide PRN   Past Surgical History:  Procedure Laterality Date  . ABDOMINAL HYSTERECTOMY  FIBROIDS 1982  . BLADDER SUSPENSION  1982  . CARDIOVERSION   02/05/2012   Procedure: CARDIOVERSION;  Surgeon: Laverda Page, MD;  Location: [place];  Service: Cardiovascular;  Laterality: N/A;  . CARDIOVERSION N/A 06/09/2013   Procedure: CARDIOVERSION;  Surgeon: Laverda Page, MD;  Location: [facility] ENDOSCOPY;  Service: Cardiovascular;  Laterality: N/A;  h&p in file-[place]  . CARDIOVERSION N/A 08/09/2015   Procedure: CARDIOVERSION;  Surgeon: Adrian Prows, MD;  Location: [facility] ENDOSCOPY;  Service: Cardiovascular;  Laterality: N/A;  . CARDIOVERSION N/A 09/13/2015   Procedure: CARDIOVERSION;  Surgeon: Adrian Prows, MD;  Location: [facility] ENDOSCOPY;  Service: Cardiovascular;  Laterality: N/A;  . CHOLECYSTECTOMY  09/03/2012   Procedure: LAPAROSCOPIC CHOLECYSTECTOMY;  Surgeon: Jamesetta So, MD;  Location: [facility];  Service: General;  Laterality: N/A;  . COLONOSCOPY  2005   Dr. Tamala Julian: sigmoid diverticulosis  . COLONOSCOPY  June 2011   Dr. Oneida Alar: internal hemorrhoids, simple adenomas, hyperplastic polyps, needs surveillance in June 2014 with overtube and Propofol  . COLONOSCOPY WITH PROPOFOL N/A 03/19/2016   Dr. Gala Romney: multiple small tubular adenomas, diverticulosis. Surveillance in 5 years   . ERCP  10/01/2012   Procedure: ENDOSCOPIC RETROGRADE CHOLANGIOPANCREATOGRAPHY (ERCP);  Surgeon: Daneil Dolin, MD;  Location: [facility];  Service: Endoscopy;  Laterality: N/A;  . ESOPHAGOGASTRODUODENOSCOPY (EGD) WITH PROPOFOL N/A 03/19/2016   DR. Rourk: normal esohpagus with small hiatal hernia   . Kidney  Stent  1995  . POLYPECTOMY  03/19/2016   Procedure: POLYPECTOMY;  Surgeon: Daneil Dolin, MD;  Location: [facility];  Service: Endoscopy;;  cold snares  . REMOVAL OF STONES  10/01/2012   Procedure: REMOVAL OF STONES;  Surgeon: Daneil Dolin, MD;  Location: [facility];  Service: Endoscopy;  Laterality: N/A;  . RHINOPLASTY    . SPHINCTEROTOMY  10/01/2012   Procedure: SPHINCTEROTOMY;  Surgeon: Daneil Dolin, MD;  Location: [facility];  Service: Endoscopy;  Laterality: N/A;   Social History    Socioeconomic History  . Marital status: Widowed    Spouse name: Widowed  . Number of children: 2  . Years of education: GED  . Highest education level: Not on file  Occupational History  . Occupation: Agricultural engineer  Social Needs  . Financial resource strain: Not on file  . Food insecurity    Worry: Not on file    Inability: Not on file  . Transportation needs    Medical: Not on file    Non-medical: Not on file  Tobacco Use  . Smoking status: Former Smoker    Packs/day: 0.50    Years: 30.00    Pack years: 15.00    Quit date: 10/03/1989    Years since quitting: 29.9  . Smokeless tobacco: Never Used  . Tobacco comment: Quit in 1991-01-08  Substance and Sexual Activity  . Alcohol use: No    Alcohol/week: 0.0 standard drinks  . Drug use: No  . Sexual activity: Not on file  Lifestyle  . Physical activity    Days per week: Not on file    Minutes per session: Not on file  . Stress: Not on file  Relationships  . Social Herbalist on phone: Not on file    Gets together: Not on file    Attends religious service: Not on file    Active member of club or organization: Not on file    Attends meetings of clubs or organizations: Not on file    Relationship status: Not on file  . Intimate partner violence    Fear of current or ex partner: Not on file    Emotionally abused: Not on file    Physically abused: Not on file    Forced sexual activity: Not on file  Other Topics Concern  . Not on file  Social History Narrative   Lives with daughter   Caffeine use: none   Right handed    Widowed. Husband passed away 01/09/2016        ROS  Review of Systems  Constitution: Positive for malaise/fatigue and weight gain. Negative for chills and decreased appetite.  Cardiovascular: Positive for dyspnea on exertion and leg swelling. Negative for syncope.  Respiratory: Positive for snoring (on CPAP).   Endocrine: Negative for cold intolerance.  Hematologic/Lymphatic: Does not bruise/bleed  easily.  Musculoskeletal: Negative for joint swelling.  Gastrointestinal: Negative for abdominal pain, anorexia, diarrhea, hematochezia and melena.  Genitourinary: Positive for bladder incontinence and frequency (chronic).  Neurological: Negative for headaches and light-headedness.  Psychiatric/Behavioral: Negative for depression and substance abuse.  All other systems reviewed and are negative.  Objective   Vitals with BMI 09/17/2019 12/29/2018 12/29/2018  Height 5' 2" - 5' 2"  Weight 317 lbs - 269 lbs  BMI 38.75 - 64.33  Systolic 295 188 -  Diastolic 73 69 -  Pulse 77 - -     Physical Exam  Constitutional:  Moderately built and Morbidly obese  in no acute distress. Has difficulty in breathing and joint pain with trying to get on exam table  HENT:  Head: Atraumatic.  Eyes: Conjunctivae are normal.  Neck: Neck supple. No thyromegaly present.  Short neck and difficult to evaluate JVP  Cardiovascular: Normal rate, regular rhythm and normal heart sounds. Exam reveals no gallop.  No murmur heard. Pulses:      Carotid pulses are 2+ on the right side and 2+ on the left side.      Dorsalis pedis pulses are 2+ on the right side and 2+ on the left side.       Posterior tibial pulses are 2+ on the right side and 2+ on the left side.  Femoral and popliteal pulse difficult to feel due to patient's body habitus.   2-3 plus leg edema, pitting.  JVD difficult to make out due to short neck  Pulmonary/Chest: Effort normal and breath sounds normal.  Abdominal: Soft. Bowel sounds are normal.  Obese. Pannus present  Musculoskeletal:        General: No tenderness or deformity.  Neurological: She is alert.  Skin: Skin is warm and dry.  Psychiatric: She has a normal mood and affect.   Laboratory examination:   Comprehensive Metabolic PanelResulted: 03/31/3975 4:35 AM [facility] Component Name Value Ref Range  Glucose 109 (H) 65 - 99 mg/dL  BUN 18 8 - 27 mg/dL  Creatinine, Serum 0.87 0.57 - 1  mg/dL  eGFR If NonAfrican American 65 >59 mL/min/1.73  eGFR If African American 75 >59 mL/min/1.73  BUN/Creatinine Ratio 21 12 - 28   Sodium 142 134 - 144 mmol/L  Potassium 4.4 3.5 - 5.2 mmol/L  Chloride 105 96 - 106 mmol/L  CO2 27 20 - 29 mmol/L  CALCIUM 9.5 8.7 - 10.3 mg/dL  Total Protein 6.4 6 - 8.5 g/dL  Albumin, Serum 4.1 3.7 - 4.7 g/dL  Globulin, Total 2.3 1.5 - 4.5 g/dL  Albumin/Globulin Ratio 1.8 1.2 - 2.2   Total Bilirubin 0.3 0 - 1.2 mg/dL  Alkaline Phosphatase 95 39 - 117 IU/L    CMP Latest Ref Rng & Units 12/29/2018 02/16/2018 07/18/2017  Glucose 70 - 99 mg/dL 89 114(H) 96  BUN 8 - 23 mg/dL _0 Creatinine 0.44 - 1.00 mg/dL 0.91 0.83 0.73  Sodium 135 - 145 mmol/L 139 139 142  Potassium 3.5 - 5.1 mmol/L 4.1 3.6 4.0  Chloride 98 - 111 mmol/L 106 102 106  CO2 22 - 32 mmol/L _1 Calcium 8.9 - 10.3 mg/dL 9.3 9.2 9.4  Total Protein 6.5 - 8.1 g/dL 7.1 7.3 -  Total Bilirubin 0.3 - 1.2 mg/dL 0.5 1.1 -  Alkaline Phos 38 - 126 U/L 77 74 -  AST 15 - 41 U/L 17 19 -  ALT 0 - 44 U/L 15 16 -   CBC Latest Ref Rng & Units 12/29/2018 02/16/2018 07/18/2017  WBC 4.0 - 10.5 K/uL 7.0 6.6 5.8  Hemoglobin 12.0 - 15.0 g/dL 14.6 13.9 13.8  Hematocrit 36.0 - 46.0 % 45.8 43.4 42.6  Platelets 150 - 400 K/uL 257 196 230   Lipid Panel     Component Value Date/Time   CHOL 170 07/22/2009 0127   TRIG 129 07/22/2009 0127   HDL 48 07/22/2009 0127   CHOLHDL 3.5 Ratio 07/22/2009 0127   VLDL 26 07/22/2009 0127   LDLCALC 96 07/22/2009 0127   HEMOGLOBIN A1C Lab Results  Component Value Date   HGBA1C 6.3 (H) 09/02/2012  MPG 134 (H) 09/02/2012   TSH No results for input(s): TSH in the last 8760 hours. Medications and allergies   Allergies  Allergen Reactions  . Meperidine Rash and Other (See Comments)    Reaction:  Bottoms out BP Other reaction(s): other/intolerance Hypotension   . Atorvastatin Other (See Comments)    Reaction:  Muscle pain   . Febuxostat Other (See Comments)     Reaction:  Visual changes  Other reaction(s): neurological reaction Visual changes Reaction:Visual changes   . Simvastatin     Other reaction(s): other/intolerance Elevated liver enzymes   . Statins     Other reaction(s): muscle/joint pain  . Zocor [Simvastatin - High Dose] Other (See Comments)    Reaction:  Elevated liver enzymes     Prior to Admission medications   Medication Sig Start Date End Date Taking? Authorizing Provider  allopurinol (ZYLOPRIM) 100 MG tablet Take 1 tablet by mouth daily. 10/17/17  Yes [provider]  amitriptyline (ELAVIL) 25 MG tablet Take 1 tablet by mouth at bedtime. 08/25/19  Yes [provider]  apixaban (ELIQUIS) 5 MG TABS tablet Take 5 mg by mouth 2 (two) times daily.   Yes [provider]  Ergocalciferol (VITAMIN D2) 2000 units TABS Take 1 tablet by mouth daily.   Yes [provider]  esomeprazole (NEXIUM) 40 MG capsule Take 40 mg by mouth every evening.    Yes [provider]  levothyroxine (SYNTHROID, LEVOTHROID) 50 MCG tablet Take 50 mcg by mouth daily before breakfast.   Yes [provider]  metoprolol tartrate (LOPRESSOR) 50 MG tablet Take 50 mg by mouth 2 (two) times daily.   Yes [provider]  miconazole (MICOTIN) 2 % cream Apply 1 application topically as needed.   Yes [provider]  nystatin-triamcinolone ointment (MYCOLOG) Apply 1 application topically as needed. 10/29/17  Yes [provider]  Polyethyl Glycol-Propyl Glycol (SYSTANE OP) Apply 2 drops to eye as needed. In both eyes   Yes [provider]     Current Outpatient Medications  Medication Instructions  . allopurinol (ZYLOPRIM) 100 MG tablet 1 tablet, Oral, Daily  . amitriptyline (ELAVIL) 25 MG tablet 1 tablet, Oral, Daily at bedtime  . apixaban (ELIQUIS) 5 mg, Oral, 2 times daily  . Ergocalciferol (VITAMIN D2) 2000 units TABS 1 tablet, Oral, Daily  . esomeprazole (NEXIUM) 40 mg, Oral,  Every evening  . furosemide (LASIX) 40 mg, Oral, 2 times daily  . levothyroxine (SYNTHROID) 50 mcg, Oral, Daily before breakfast  . metoprolol succinate (TOPROL-XL) 100 mg, Oral, Daily, Take with or immediately following a meal.  . miconazole (MICOTIN) 2 % cream 1 application, Topical, As needed  . nystatin-triamcinolone ointment (MYCOLOG) 1 application, Topical, As needed  . Polyethyl Glycol-Propyl Glycol (SYSTANE OP) 2 drops, Ophthalmic, As needed, In both eyes  . spironolactone (ALDACTONE) 25 mg, Oral, Daily    Radiology:  No results found.   Cardiac Studies:   Echocardiogram 09/20/2016: Left ventricle cavity is normal in size. Mild concentric hypertrophy of the left ventricle. Low normal LV systolic function. Doppler evidence of grade II (pseudonormal) diastolic dysfunction, elevated LAP. Visual EF is 50-55%. Calculated EF 48%. Left atrial cavity is severely dilated at 5.0 CM Mild (Grade I) mitral regurgitation. Mild tricuspid regurgitation. Mild pulmonary hypertension. Pulmonary artery systolic pressure is estimated at 31 mm Hg. Compared to the study done on 04/14/2013, previously normal LV systolic function. Otherwise no significant change. Right ventricle is not well visualized.  Lexiscan myoview stress test  03/29/2017: Two day protocol followed due to patient's bodily habitus. 1. The resting electrocardiogram demonstrated atrial fibrillation and normal rest repolarization, low voltage complexes. Stress EKG is non-diagnostic for ischemia as it a pharmacologic stress using Lexiscan. 2. REST and STRESS images demonstrate decreased tracer uptake in the basal inferoseptal, basal inferior, mid inferoseptal and apical segments of the left ventricle. These defects are related to breast attenuation. Mild ischemia in this region cannot be completely excluded. Gated SPECT images reveal normal myocardial thickening and wall motion. The left ventricular ejection fraction was calculated or  visually estimated to be 60%. This is a low risk study, clinical correlation recommended in view of patient's bodily habitus.  Assessment     ICD-10-CM   1. Persistent atrial fibrillation (HCC)  I48.19 EKG 12-Lead    metoprolol succinate (TOPROL-XL) 100 MG 24 hr tablet   CHA2DS2-VASc Score is 4.  Yearly risk of stroke:4%(A, F, HTN).  2. Acute on chronic diastolic heart failure (HCC)  I50.33 furosemide (LASIX) 40 MG tablet    spironolactone (ALDACTONE) 25 MG tablet    Basic metabolic panel    Brain natriuretic peptide  3. Class 3 severe obesity due to excess calories with serious comorbidity and body mass index (BMI) of 50.0 to 59.9 in adult [facility])  E66.01    Z68.43     EKG 09/17/2019: Atrial fibrillation with controlled ventricular response at the rate of 90 bpm, normal axis, poor R wave progression, cannot exclude anteroseptal infarct old.  Low-voltage complexes.  Nonspecific T abnormality.  Consider pulmonary disease pattern. No significant change from  EKG 09/18/2018     Recommendations:    Patient is here for annual visit and follow-up of chronic diastolic heart failure, permanent atrial fibrillation, morbid obesity. Past medical history significant for hypertension, obstructive sleep apnea, depression.  Patient has gained significant amount of weight, BMI has gone from 49-54, she is in acute decompensated heart failure, diastolic.  She requested a change of metoprolol for treatment of succinate in view of underlying pulmonary disease and shortness of breath, sent in Toprol-XL 100 mg daily.  I have started her on furosemide 40 mg b.i.d. and also spironolactone 25 mg every day, we will obtain a BMP and a BNP in the morning and again she'll repeat the same thing in 10-12 days and I would like to see her back in 2 weeks.  I reviewed her labs from outside sources, renal function has remained stable.  Her acute decompensated heart failure is purely from her poor eating habits and also  excessive gain in weight over the past few months.  Symptoms ongoing for the past 6-8 months.  Not sure I'll be able to obtain an echocardiogram in one of poor echo window but I'll stabilize her 1st and consider repeating an echocardiogram.  This was a 40 minute encounter with >50% time spent on discussion and counseling about heart failure and A. Fib and Obesity.   Adrian Prows, MD, [facility] 09/19/2019, 6:02 PM [facility] [place] Pager: [phone] Office: [phone] If no answer Cell [phone]

## 2019-09-18 ENCOUNTER — Other Ambulatory Visit: Payer: Self-pay | Admitting: Cardiology

## 2019-09-18 DIAGNOSIS — I5033 Acute on chronic diastolic (congestive) heart failure: Secondary | ICD-10-CM | POA: Diagnosis not present

## 2019-09-19 LAB — BASIC METABOLIC PANEL
BUN/Creatinine Ratio: 19 (ref 12–28)
BUN: 16 mg/dL (ref 8–27)
CO2: 28 mmol/L (ref 20–29)
Calcium: 9 mg/dL (ref 8.7–10.3)
Chloride: 103 mmol/L (ref 96–106)
Creatinine, Ser: 0.83 mg/dL (ref 0.57–1.00)
GFR calc Af Amer: 79 mL/min/{1.73_m2} (ref >59)
GFR calc non Af Amer: 68 mL/min/{1.73_m2} (ref >59)
Glucose: 118 mg/dL — ABNORMAL HIGH (ref 65–99)
Potassium: 4.3 mmol/L (ref 3.5–5.2)
Sodium: 142 mmol/L (ref 134–144)

## 2019-09-19 LAB — BRAIN NATRIURETIC PEPTIDE: BNP: 110 pg/mL — ABNORMAL HIGH (ref 0.0–100.0)

## 2019-09-23 ENCOUNTER — Telehealth: Payer: Self-pay

## 2019-09-23 ENCOUNTER — Emergency Department (HOSPITAL_BASED_OUTPATIENT_CLINIC_OR_DEPARTMENT_OTHER): Payer: Medicare Other

## 2019-09-23 ENCOUNTER — Emergency Department (HOSPITAL_BASED_OUTPATIENT_CLINIC_OR_DEPARTMENT_OTHER)
Admission: EM | Admit: 2019-09-23 | Discharge: 2019-09-23 | Disposition: A | Discharge status: Home / Self Care | Payer: Medicare Other | Attending: Emergency Medicine | Admitting: Emergency Medicine

## 2019-09-23 ENCOUNTER — Other Ambulatory Visit: Payer: Self-pay

## 2019-09-23 ENCOUNTER — Encounter (HOSPITAL_BASED_OUTPATIENT_CLINIC_OR_DEPARTMENT_OTHER): Payer: Self-pay

## 2019-09-23 DIAGNOSIS — M797 Fibromyalgia: Secondary | ICD-10-CM | POA: Diagnosis not present

## 2019-09-23 DIAGNOSIS — Z7901 Long term (current) use of anticoagulants: Secondary | ICD-10-CM | POA: Insufficient documentation

## 2019-09-23 DIAGNOSIS — Z79899 Other long term (current) drug therapy: Secondary | ICD-10-CM | POA: Diagnosis not present

## 2019-09-23 DIAGNOSIS — J45909 Unspecified asthma, uncomplicated: Secondary | ICD-10-CM | POA: Insufficient documentation

## 2019-09-23 DIAGNOSIS — I5033 Acute on chronic diastolic (congestive) heart failure: Secondary | ICD-10-CM | POA: Diagnosis not present

## 2019-09-23 DIAGNOSIS — I110 Hypertensive heart disease with heart failure: Secondary | ICD-10-CM | POA: Diagnosis not present

## 2019-09-23 DIAGNOSIS — Z888 Allergy status to other drugs, medicaments and biological substances status: Secondary | ICD-10-CM | POA: Insufficient documentation

## 2019-09-23 DIAGNOSIS — R0602 Shortness of breath: Secondary | ICD-10-CM | POA: Diagnosis not present

## 2019-09-23 DIAGNOSIS — Z87891 Personal history of nicotine dependence: Secondary | ICD-10-CM | POA: Diagnosis not present

## 2019-09-23 DIAGNOSIS — I4819 Other persistent atrial fibrillation: Secondary | ICD-10-CM | POA: Insufficient documentation

## 2019-09-23 DIAGNOSIS — E785 Hyperlipidemia, unspecified: Secondary | ICD-10-CM | POA: Insufficient documentation

## 2019-09-23 DIAGNOSIS — I509 Heart failure, unspecified: Secondary | ICD-10-CM

## 2019-09-23 DIAGNOSIS — E039 Hypothyroidism, unspecified: Secondary | ICD-10-CM | POA: Diagnosis not present

## 2019-09-23 DIAGNOSIS — I4891 Unspecified atrial fibrillation: Secondary | ICD-10-CM | POA: Diagnosis not present

## 2019-09-23 HISTORY — DX: Heart failure, unspecified: I50.9

## 2019-09-23 LAB — COMPREHENSIVE METABOLIC PANEL
ALT: 30 U/L (ref 0–44)
AST: 28 U/L (ref 15–41)
Albumin: 4.5 g/dL (ref 3.5–5.0)
Alkaline Phosphatase: 81 U/L (ref 38–126)
Anion gap: 10 (ref 5–15)
BUN: 19 mg/dL (ref 8–23)
CO2: 31 mmol/L (ref 22–32)
Calcium: 9.9 mg/dL (ref 8.9–10.3)
Chloride: 99 mmol/L (ref 98–111)
Creatinine, Ser: 1 mg/dL (ref 0.44–1.00)
GFR calc Af Amer: >60 mL/min (ref >60)
GFR calc non Af Amer: 54 mL/min — ABNORMAL LOW (ref >60)
Glucose, Bld: 109 mg/dL — ABNORMAL HIGH (ref 70–99)
Potassium: 3.8 mmol/L (ref 3.5–5.1)
Sodium: 140 mmol/L (ref 135–145)
Total Bilirubin: 0.5 mg/dL (ref 0.3–1.2)
Total Protein: 7.4 g/dL (ref 6.5–8.1)

## 2019-09-23 LAB — CBC WITH DIFFERENTIAL/PLATELET
Abs Immature Granulocytes: 0.03 10*3/uL (ref 0.00–0.07)
Basophils Absolute: 0.1 10*3/uL (ref 0.0–0.1)
Basophils Relative: 1 %
Eosinophils Absolute: 0.4 10*3/uL (ref 0.0–0.5)
Eosinophils Relative: 5 %
HCT: 47.3 % — ABNORMAL HIGH (ref 36.0–46.0)
Hemoglobin: 14.8 g/dL (ref 12.0–15.0)
Immature Granulocytes: 0 %
Lymphocytes Relative: 22 %
Lymphs Abs: 1.7 10*3/uL (ref 0.7–4.0)
MCH: 28.6 pg (ref 26.0–34.0)
MCHC: 31.3 g/dL (ref 30.0–36.0)
MCV: 91.3 fL (ref 80.0–100.0)
Monocytes Absolute: 0.7 10*3/uL (ref 0.1–1.0)
Monocytes Relative: 9 %
Neutro Abs: 4.8 10*3/uL (ref 1.7–7.7)
Neutrophils Relative %: 63 %
Platelets: 261 10*3/uL (ref 150–400)
RBC: 5.18 MIL/uL — ABNORMAL HIGH (ref 3.87–5.11)
RDW: 12.2 % (ref 11.5–15.5)
WBC: 7.7 10*3/uL (ref 4.0–10.5)
nRBC: 0 % (ref 0.0–0.2)

## 2019-09-23 LAB — BRAIN NATRIURETIC PEPTIDE: B Natriuretic Peptide: 74.5 pg/mL (ref 0.0–100.0)

## 2019-09-23 MED ORDER — FUROSEMIDE 10 MG/ML IJ SOLN
80.0000 mg | Freq: Once | INTRAMUSCULAR | Status: AC
  Administered 2019-09-23: 80 mg via INTRAVENOUS
  Filled 2019-09-23: qty 8

## 2019-09-23 MED ORDER — IOHEXOL 350 MG/ML SOLN
100.0000 mL | Freq: Once | INTRAVENOUS | Status: AC | PRN
  Administered 2019-09-23: 100 mL via INTRAVENOUS

## 2019-09-23 NOTE — Discharge Instructions (Addendum)
Continue medications as previously prescribed.  Follow-up with your primary doctor in the next week, and return to the ER if your symptoms significantly worsen or change. 

## 2019-09-23 NOTE — ED Notes (Signed)
Portable chest at beside

## 2019-09-23 NOTE — ED Notes (Signed)
Ambulated in the room with some exertion. Patient states no more than usual. Walked about her distance she does at home. MD made aware. VS stable.

## 2019-09-23 NOTE — Telephone Encounter (Signed)
Spoke to patient she is c/o fluid and swelling and not going to the bathroom. Per Lavone Nian she was advised to go to the Emergency Room . Patient understood and agreed

## 2019-09-23 NOTE — ED Notes (Signed)
Patient verbalizes understanding of discharge instructions. Opportunity for questioning and answers were provided. Armband removed by staff, pt discharged from ED in wheelchair.  

## 2019-09-23 NOTE — ED Notes (Signed)
Pt is actively voiding after Lasix 80mg .    Pt unable to get up for ambulation as she is voiding a lot.

## 2019-09-23 NOTE — ED Provider Notes (Signed)
[facility] EMERGENCY DEPARTMENT Provider Note   CSN: [id number] Arrival date & time: 09/23/19  1257     History   Chief Complaint Chief Complaint  Patient presents with  . Shortness of Breath    HPI [name] is a 78 y.o. female.     Patient brought in by family member.  Patient with increased shortness of breath started last night.  Patient seen a week ago has a new diagnosis of congestive heart failure.  Started on oral Lasix 40 mg twice a day.  Patient thinks it is not really making her pee anymore than usual.  51 she feels there is concerned that she is fluid overloaded.  Patient also has history of atrial fibrillation she is on Eliquis.  Patient without any real diagnosis of COPD.  But does have some sleep apnea and does have CPAP at home.  Is able to use nasal cannula oxygen if needed.  But usually does not use it.  Denies any chest pain denies any fevers denies any upper respiratory symptoms.     Past Medical History:  Diagnosis Date  . A-fib ([place])   . Allergic rhinitis   . Anxiety   . Asthma    pt states she "believe it is more allergies"  . CHF (congestive heart failure) ([place])   . Cholecystitis   . Chronic abdominal pain   . Colon polyp    TCS 2005 TICS [place], TCS/PROPOFOL 2011 SIMPLE ADENOMAS  . Depression   . Diverticulosis 2005  . Fatty liver   . Fibromyalgia   . GERD (gastroesophageal reflux disease)   . Gout   . Hyperlipemia   . Hypothyroidism   . Internal hemorrhoids    H/o Rectal bleeding secondary to internal hemorrhoids 04/2010.  Marland Kitchen MCI (mild cognitive impairment) 12/30/2017  . Migraine   . Narcolepsy   . Obesity, morbid (more than 100 lbs over ideal weight or BMI > 40) (HCC)   . Obstructive sleep apnea on CPAP and O2  . Osteoarthritis   . Persistent atrial fibrillation ([place])    a. s/p DCCV 01/2012  . Volume overload    uses torsemide PRN    Patient Active Problem List   Diagnosis Date Noted  . Bilateral sensorineural hearing loss  03/10/2018  . Tinnitus 03/10/2018  . Chronic anticoagulation 02/04/2018  . Persistent atrial fibrillation ([place]) 02/04/2018  . Morbid obesity due to excess calories ([place]) 02/04/2018  . MCI (mild cognitive impairment) 12/30/2017  . Hepatic steatosis 12/31/2016  . Chronic RUQ pain 12/31/2016  . History of colonic polyps   . Diverticulosis of colon without hemorrhage   . Hiatal hernia   . RUQ abdominal pain 02/27/2016  . Dyspnea 05/03/2014  . Acute respiratory failure ([place]) 01/13/2014  . CAP (community acquired pneumonia) 01/12/2014  . RUQ pain 02/05/2013  . Rib pain on right side 02/05/2013  . Cholelithiasis 09/01/2012  . Atrial fibrillation ([place]) 09/01/2012  . Diastolic CHF ([place]) 0000000  . INTERNAL HEMORRHOIDS 11/06/2010  . FATIGUE 11/06/2010  . Abdominal pain 11/06/2010  . INTERTRIGO, CANDIDAL 06/03/2009  . VARICOSE VEINS LOWER EXTREMITIES W/INFLAMMATION 01/10/2009  . HYPOTHYROIDISM 11/30/2006  . HYPERLIPIDEMIA 11/30/2006  . OBESITY, MORBID 11/30/2006  . ANXIETY 11/30/2006  . DEPRESSION 11/30/2006  . OBSTRUCTIVE SLEEP APNEA 11/30/2006  . Common migraine 11/30/2006  . NARCOLEPSY W/CATAPLEXY 11/30/2006  . ALLERGIC RHINITIS 11/30/2006  . ASTHMA 11/30/2006  . GERD 11/30/2006  . OSTEOARTHRITIS 11/30/2006  . FIBROMYALGIA 11/30/2006  . PALPITATIONS 11/30/2006  .  COLONIC POLYPS, HX OF 11/30/2006    Past Surgical History:  Procedure Laterality Date  . ABDOMINAL HYSTERECTOMY  FIBROIDS 1982  . BLADDER SUSPENSION  1982  . CARDIOVERSION  02/05/2012   Procedure: CARDIOVERSION;  Surgeon: Laverda Page, MD;  Location: [facility];  Service: Cardiovascular;  Laterality: N/A;  . CARDIOVERSION N/A 06/09/2013   Procedure: CARDIOVERSION;  Surgeon: Laverda Page, MD;  Location: [facility] ENDOSCOPY;  Service: Cardiovascular;  Laterality: N/A;  h&p in file-[place]  . CARDIOVERSION N/A 08/09/2015   Procedure: CARDIOVERSION;  Surgeon: Adrian Prows, MD;  Location: [facility] ENDOSCOPY;  Service: Cardiovascular;   Laterality: N/A;  . CARDIOVERSION N/A 09/13/2015   Procedure: CARDIOVERSION;  Surgeon: Adrian Prows, MD;  Location: [facility] ENDOSCOPY;  Service: Cardiovascular;  Laterality: N/A;  . CHOLECYSTECTOMY  09/03/2012   Procedure: LAPAROSCOPIC CHOLECYSTECTOMY;  Surgeon: Jamesetta So, MD;  Location: [facility] ORS;  Service: General;  Laterality: N/A;  . COLONOSCOPY  2005   Dr. Tamala Julian: sigmoid diverticulosis  . COLONOSCOPY  June 2011   Dr. Oneida Alar: internal hemorrhoids, simple adenomas, hyperplastic polyps, needs surveillance in June 2014 with overtube and Propofol  . COLONOSCOPY WITH PROPOFOL N/A 03/19/2016   Dr. Gala Romney: multiple small tubular adenomas, diverticulosis. Surveillance in 5 years   . ERCP  10/01/2012   Procedure: ENDOSCOPIC RETROGRADE CHOLANGIOPANCREATOGRAPHY (ERCP);  Surgeon: Daneil Dolin, MD;  Location: [facility] ORS;  Service: Endoscopy;  Laterality: N/A;  . ESOPHAGOGASTRODUODENOSCOPY (EGD) WITH PROPOFOL N/A 03/19/2016   DR. Rourk: normal esohpagus with small hiatal hernia   . Kidney Stent  1995  . POLYPECTOMY  03/19/2016   Procedure: POLYPECTOMY;  Surgeon: Daneil Dolin, MD;  Location: [facility] END[facility] SU[facility];  Service: Endoscopy;;  cold snares  . REMOVAL OF STONES  10/01/2012   Procedure: REMOVAL OF STONES;  Surgeon: Daneil Dolin, MD;  Location: [facility] ORS;  Service: Endoscopy;  Laterality: N/A;  . RHINOPLASTY    . SPHINCTEROTOMY  10/01/2012   Procedure: SPHINCTEROTOMY;  Surgeon: Daneil Dolin, MD;  Location: [facility];  Service: Endoscopy;  Laterality: N/A;     OB History   No obstetric history on file.      Home Medications    Prior to Admission medications   Medication Sig Start Date End Date Taking? Authorizing Provider  allopurinol (ZYLOPRIM) 100 MG tablet Take 1 tablet by mouth daily. 10/17/17   [provider]  amitriptyline (ELAVIL) 25 MG tablet Take 1 tablet by mouth at bedtime. 08/25/19   [provider]  apixaban (ELIQUIS) 5 MG TABS tablet Take 5 mg by mouth 2 (two) times daily.     [provider]  Ergocalciferol (VITAMIN D2) 2000 units TABS Take 1 tablet by mouth daily.    [provider]  esomeprazole (NEXIUM) 40 MG capsule Take 40 mg by mouth every evening.     [provider]  furosemide (LASIX) 40 MG tablet Take 1 tablet (40 mg total) by mouth 2 (two) times daily. 09/17/19 12/16/19  Adrian Prows, MD  levothyroxine (SYNTHROID, LEVOTHROID) 50 MCG tablet Take 50 mcg by mouth daily before breakfast.    [provider]  metoprolol succinate (TOPROL-XL) 100 MG 24 hr tablet Take 1 tablet (100 mg total) by mouth daily. Take with or immediately following a meal. 09/17/19 12/16/19  Adrian Prows, MD  miconazole (MICOTIN) 2 % cream Apply 1 application topically as needed.    [provider]  nystatin-triamcinolone ointment (MYCOLOG) Apply 1 application topically as needed. 10/29/17   [provider]  Polyethyl Glycol-Propyl Glycol (SYSTANE OP) Apply 2 drops to eye as needed. In both eyes    [provider]  spironolactone (ALDACTONE) 25 MG tablet Take 1 tablet (25 mg total) by mouth daily. 09/17/19 12/16/19  Adrian Prows, MD    Family History Family History  Problem Relation Age of Onset  . Colon cancer Mother 56  . Diabetes Mother   . Asthma Mother   . Heart disease Mother   . Cerebral aneurysm Father   . Asthma Maternal Aunt   . Asthma Brother   . Heart disease Maternal Grandfather   . Heart disease Maternal Grandmother     Social History Social History   Tobacco Use  . Smoking status: Former Smoker    Packs/day: 0.50    Years: 30.00    Pack years: 15.00    Quit date: 10/03/1989    Years since quitting: 29.9  . Smokeless tobacco: Never Used  . Tobacco comment: Quit in 1992  Substance Use Topics  . Alcohol use: No    Alcohol/week: 0.0 standard drinks  . Drug use: No     Allergies   Meperidine, Atorvastatin, Febuxostat, Simvastatin, Statins, and Zocor [simvastatin - high dose]   Review of Systems  Review of Systems  Constitutional: Negative for chills and fever.  HENT: Negative for congestion, rhinorrhea and sore throat.   Eyes: Negative for visual disturbance.  Respiratory: Positive for shortness of breath. Negative for cough.   Cardiovascular: Positive for leg swelling. Negative for chest pain.  Gastrointestinal: Negative for abdominal pain, diarrhea, nausea and vomiting.  Genitourinary: Positive for decreased urine volume. Negative for dysuria.  Musculoskeletal: Negative for back pain and neck pain.  Skin: Negative for rash.  Neurological: Negative for dizziness, light-headedness and headaches.  Hematological: Does not bruise/bleed easily.  Psychiatric/Behavioral: Negative for confusion.     Physical Exam Updated Vital Signs BP 116/69 (BP Location: Left Arm)   Pulse 86   Temp 97.7 F (36.5 C) (Oral)   Resp 12   Ht 1.575 m (5\' 2" )   Wt (!) 140.6 kg   SpO2 96%   BMI 56.70 kg/m   Physical Exam Vitals signs and nursing note reviewed.  Constitutional:      General: She is not in acute distress.    Appearance: Normal appearance. She is well-developed.  HENT:     Head: Normocephalic and atraumatic.  Eyes:     Extraocular Movements: Extraocular movements intact.     Conjunctiva/sclera: Conjunctivae normal.     Pupils: Pupils are equal, round, and reactive to light.  Neck:     Musculoskeletal: Normal range of motion and neck supple.  Cardiovascular:     Rate and Rhythm: Normal rate and regular rhythm.     Heart sounds: No murmur.  Pulmonary:     Effort: Pulmonary effort is normal. No respiratory distress.     Breath sounds: Normal breath sounds. No wheezing or rales.  Abdominal:     Palpations: Abdomen is soft.     Tenderness: There is no abdominal tenderness.  Musculoskeletal:        General: Swelling present.  Skin:    General: Skin is warm and dry.  Neurological:     General: No focal deficit present.     Mental Status: She is alert and oriented to  person, place, and time.      ED Treatments / Results  Labs (all labs ordered are listed, but only abnormal results are displayed) Labs Reviewed  COMPREHENSIVE METABOLIC  PANEL - Abnormal; Notable for the following components:      Result Value   Glucose, Bld 109 (*)    GFR calc non Af Amer 54 (*)    All other components within normal limits  CBC WITH DIFFERENTIAL/PLATELET - Abnormal; Notable for the following components:   RBC 5.18 (*)    HCT 47.3 (*)    All other components within normal limits  BRAIN NATRIURETIC PEPTIDE    EKG EKG Interpretation  Date/Time:  Wednesday September 23 2019 13:28:10 EDT Ventricular Rate:  83 PR Interval:    QRS Duration: 97 QT Interval:  391 QTC Calculation: 460 R Axis:   -26 Text Interpretation: Atrial fibrillation Borderline left axis deviation Low voltage, precordial leads Confirmed by Fredia Sorrow [phone]) on 09/23/2019 2:42:39 PM   Radiology Dg Chest Port 1 View  Result Date: 09/23/2019 CLINICAL DATA:  Shortness of breath, CHF EXAM: PORTABLE CHEST 1 VIEW COMPARISON:  02/16/2018 FINDINGS: Cardiomegaly. Both lungs are clear. The visualized skeletal structures are unremarkable. IMPRESSION: Cardiomegaly without acute abnormality of the lungs in [facility] portable projection. Electronically Signed   By: Eddie Candle M.D.   On: 09/23/2019 13:43    Procedures Procedures (including critical care time)  Medications Ordered in ED Medications  furosemide (LASIX) injection 80 mg (has no administration in time range)     Initial Impression / Assessment and Plan / ED Course  I have reviewed the triage vital signs and the nursing notes.  Pertinent labs & imaging results that were available during my care of the patient were reviewed by me and considered in my medical decision making (see chart for details).        Patient oxygen levels on room air when she first came in was 98%.  But in the room on the cardiac monitor more like a low 90s.   Initial respiratory rate was 36.  The respiratory rate now is down 14.  Cardiac monitoring shows atrial fib EKG shows same rate controlled.  Chest x-ray without evidence of any pulmonary edema.  BNP is pending.  Basic labs including renal function without significant abnormalities.  Going to get CT angio chest to rule out pulmonary embolus.  And get closer look at the lungs.  Clinically was suspicious the patient was in CHF.  Patient was given 80 mg of IV Lasix and has diuresed well.  Initial chest x-ray did not show any pulmonary edema.  Evening physician will need to follow-up to CT angio follow-up the BMP and make sure the patient's oxygen is good with ambulation.  However if she needs oxygen she does have the ability use oxygen at home she normally just uses it with her CPAP machine for sleep apnea but she can convert that over to nasal cannula for daytime use if needed.    Final Clinical Impressions(s) / ED Diagnoses   Final diagnoses:  SOB (shortness of breath)    ED Discharge Orders    None       Fredia Sorrow, MD 09/23/19 [phone]

## 2019-09-23 NOTE — ED Provider Notes (Signed)
Care assumed from Dr. Rogene Houston at shift change.  Patient awaiting results of a CT scan of her chest to rule out pulmonary embolism.  This study was performed and is negative.  Her BNP has also returned as normal.  Patient has received IV Lasix here in the ER and has had a significant diuresis.  Patient was ambulated in the hall with some drop in her oxygen saturations, however patient states that she feels well.  She does have oxygen at home which she can use.  She is to follow-up with her primary doctor and return if symptoms worsen or change.   Veryl Speak, MD 09/23/19 [phone]

## 2019-09-23 NOTE — ED Triage Notes (Addendum)
Pt c/o SOB increase since last night-denies fever/flu like sx-states she was seen by PCP and Cards last week with new dx CHF and meds-pt to triage in w/c with extreme SOB from w/c to scale-eased some while seated in chair-pain to right side abd

## 2019-09-25 DIAGNOSIS — J4532 Mild persistent asthma with status asthmaticus: Secondary | ICD-10-CM | POA: Diagnosis not present

## 2019-09-25 DIAGNOSIS — I480 Paroxysmal atrial fibrillation: Secondary | ICD-10-CM | POA: Diagnosis not present

## 2019-09-25 DIAGNOSIS — F330 Major depressive disorder, recurrent, mild: Secondary | ICD-10-CM | POA: Diagnosis not present

## 2019-10-01 ENCOUNTER — Other Ambulatory Visit: Payer: Self-pay | Admitting: Cardiology

## 2019-10-01 DIAGNOSIS — I5033 Acute on chronic diastolic (congestive) heart failure: Secondary | ICD-10-CM | POA: Diagnosis not present

## 2019-10-02 ENCOUNTER — Encounter: Payer: Self-pay | Admitting: Cardiology

## 2019-10-02 ENCOUNTER — Other Ambulatory Visit: Payer: Self-pay

## 2019-10-02 ENCOUNTER — Ambulatory Visit (INDEPENDENT_AMBULATORY_CARE_PROVIDER_SITE_OTHER): Payer: Medicare Other | Admitting: Cardiology

## 2019-10-02 VITALS — BP 118/66 | HR 62 | Temp 97.6°F | Ht 62.0 in | Wt 315.4 lb

## 2019-10-02 DIAGNOSIS — I5033 Acute on chronic diastolic (congestive) heart failure: Secondary | ICD-10-CM | POA: Diagnosis not present

## 2019-10-02 DIAGNOSIS — Z6841 Body Mass Index (BMI) 40.0 and over, adult: Secondary | ICD-10-CM

## 2019-10-02 DIAGNOSIS — I4819 Other persistent atrial fibrillation: Secondary | ICD-10-CM

## 2019-10-02 DIAGNOSIS — M545 Low back pain: Secondary | ICD-10-CM | POA: Diagnosis not present

## 2019-10-02 LAB — BASIC METABOLIC PANEL
BUN/Creatinine Ratio: 18 (ref 12–28)
BUN: 19 mg/dL (ref 8–27)
CO2: 27 mmol/L (ref 20–29)
Calcium: 9.6 mg/dL (ref 8.7–10.3)
Chloride: 100 mmol/L (ref 96–106)
Creatinine, Ser: 1.06 mg/dL — ABNORMAL HIGH (ref 0.57–1.00)
GFR calc Af Amer: 59 mL/min/{1.73_m2} — ABNORMAL LOW (ref >59)
GFR calc non Af Amer: 51 mL/min/{1.73_m2} — ABNORMAL LOW (ref >59)
Glucose: 112 mg/dL — ABNORMAL HIGH (ref 65–99)
Potassium: 4.1 mmol/L (ref 3.5–5.2)
Sodium: 139 mmol/L (ref 134–144)

## 2019-10-02 LAB — BRAIN NATRIURETIC PEPTIDE: BNP: 73.2 pg/mL (ref 0.0–100.0)

## 2019-10-02 MED ORDER — TORSEMIDE 20 MG PO TABS: 20.0000 mg | ORAL_TABLET | Freq: Two times a day (BID) | ORAL | 1 refills | Status: DC

## 2019-10-02 NOTE — Progress Notes (Signed)
Primary Physician/Referring:  Denny Levy, PA  Patient ID: [name], female    DOB: Dec 22, 1940, 78 y.o.   MRN: [id number]  Chief Complaint  Patient presents with  . Congestive Heart Failure  . Follow-up    2wk   HPI:    [name]  is a 78 y.o. Caucasian female with persistent atrial fibrillation, morbid obesity, depression, chronic diastolic CHF is here for 2 week OV for CHF and dyspnea.  Patient has gained significant amount of weight, Her other significant history includes hypertension, frequency and urgency of urination that is chronic, very sensitive to medications.  She continues to have upper abdominal discomfort and has had extensive evaluation and no etiology has been found.   Over the past 3 months, she has noticed worsening dyspnea. Has developed worsening leg edema and worsening fatigue.   Past Medical History:  Diagnosis Date  . A-fib ([place])   . Allergic rhinitis   . Anxiety   . Asthma    pt states she "believe it is more allergies"  . CHF (congestive heart failure) ([place])   . Cholecystitis   . Chronic abdominal pain   . Colon polyp    TCS 2005 TICS [place], TCS/PROPOFOL 2011 SIMPLE [place]  . Depression   . Diverticulosis 2005  . Fatty liver   . Fibromyalgia   . GERD (gastroesophageal reflux disease)   . Gout   . Hyperlipemia   . Hypothyroidism   . Internal hemorrhoids    H/o Rectal bleeding secondary to internal hemorrhoids 04/2010.  Marland Kitchen MCI (mild cognitive impairment) 12/30/2017  . Migraine   . Narcolepsy   . Obesity, morbid (more than 100 lbs over ideal weight or BMI > 40) (HCC)   . Obstructive sleep apnea on CPAP and O2  . Osteoarthritis   . Persistent atrial fibrillation ([place])    a. s/p DCCV 01/2012  . Volume overload    uses torsemide PRN   Past Surgical History:  Procedure Laterality Date  . ABDOMINAL HYSTERECTOMY  FIBROIDS 1982  . BLADDER SUSPENSION  1982  . CARDIOVERSION  02/05/2012   Procedure: CARDIOVERSION;  Surgeon: Laverda Page,  MD;  Location: [facility];  Service: Cardiovascular;  Laterality: N/A;  . CARDIOVERSION N/A 06/09/2013   Procedure: CARDIOVERSION;  Surgeon: Laverda Page, MD;  Location: [facility] ENDOSCOPY;  Service: Cardiovascular;  Laterality: N/A;  h&p in file-[place]  . CARDIOVERSION N/A 08/09/2015   Procedure: CARDIOVERSION;  Surgeon: Adrian Prows, MD;  Location: [facility] ENDOSCOPY;  Service: Cardiovascular;  Laterality: N/A;  . CARDIOVERSION N/A 09/13/2015   Procedure: CARDIOVERSION;  Surgeon: Adrian Prows, MD;  Location: [facility] ENDOSCOPY;  Service: Cardiovascular;  Laterality: N/A;  . CHOLECYSTECTOMY  09/03/2012   Procedure: LAPAROSCOPIC CHOLECYSTECTOMY;  Surgeon: Jamesetta So, MD;  Location: [facility];  Service: General;  Laterality: N/A;  . COLONOSCOPY  2005   Dr. Tamala Julian: sigmoid diverticulosis  . COLONOSCOPY  June 2011   Dr. Oneida Alar: internal hemorrhoids, simple adenomas, hyperplastic polyps, needs surveillance in June 2014 with overtube and Propofol  . COLONOSCOPY WITH PROPOFOL N/A 03/19/2016   Dr. Gala Romney: multiple small tubular adenomas, diverticulosis. Surveillance in 5 years   . ERCP  10/01/2012   Procedure: ENDOSCOPIC RETROGRADE CHOLANGIOPANCREATOGRAPHY (ERCP);  Surgeon: Daneil Dolin, MD;  Location: [facility];  Service: Endoscopy;  Laterality: N/A;  . ESOPHAGOGASTRODUODENOSCOPY (EGD) WITH PROPOFOL N/A 03/19/2016   DR. Rourk: normal esohpagus with small hiatal hernia   . Kidney Stent  1995  . POLYPECTOMY  03/19/2016  Procedure: POLYPECTOMY;  Surgeon: Daneil Dolin, MD;  [facility];  Service: Endoscopy;;  cold snares  . REMOVAL OF STONES  10/01/2012   Procedure: REMOVAL OF STONES;  Surgeon: Daneil Dolin, MD;  Location: [facility];  Service: Endoscopy;  Laterality: N/A;  . RHINOPLASTY    . SPHINCTEROTOMY  10/01/2012   Procedure: SPHINCTEROTOMY;  Surgeon: Daneil Dolin, MD;  Location: [facility];  Service: Endoscopy;  Laterality: N/A;   Social History   Socioeconomic History  . Marital status: Widowed    Spouse name:  Widowed  . Number of children: 2  . Years of education: GED  . Highest education level: Not on file  Occupational History  . Occupation: Agricultural engineer  Social Needs  . Financial resource strain: Not on file  . Food insecurity    Worry: Not on file    Inability: Not on file  . Transportation needs    Medical: Not on file    Non-medical: Not on file  Tobacco Use  . Smoking status: Former Smoker    Packs/day: 0.50    Years: 30.00    Pack years: 15.00    Quit date: 10/03/1989    Years since quitting: 30.0  . Smokeless tobacco: Never Used  . Tobacco comment: Quit in 02-25-1991  Substance and Sexual Activity  . Alcohol use: No    Alcohol/week: 0.0 standard drinks  . Drug use: No  . Sexual activity: Not on file  Lifestyle  . Physical activity    Days per week: Not on file    Minutes per session: Not on file  . Stress: Not on file  Relationships  . Social Herbalist on phone: Not on file    Gets together: Not on file    Attends religious service: Not on file    Active member of club or organization: Not on file    Attends meetings of clubs or organizations: Not on file    Relationship status: Not on file  . Intimate partner violence    Fear of current or ex partner: Not on file    Emotionally abused: Not on file    Physically abused: Not on file    Forced sexual activity: Not on file  Other Topics Concern  . Not on file  Social History Narrative   Lives with daughter   Caffeine use: none   Right handed    Widowed. Husband passed away 2016/02/25        ROS  Review of Systems  Constitution: Positive for malaise/fatigue and weight gain. Negative for chills and decreased appetite.  Cardiovascular: Positive for dyspnea on exertion and leg swelling. Negative for syncope.  Respiratory: Positive for snoring (on CPAP).   Endocrine: Negative for cold intolerance.  Hematologic/Lymphatic: Does not bruise/bleed easily.  Musculoskeletal: Negative for joint swelling.   Gastrointestinal: Negative for abdominal pain, anorexia, diarrhea, hematochezia and melena.  Genitourinary: Positive for bladder incontinence and frequency (chronic).  Neurological: Negative for headaches and light-headedness.  Psychiatric/Behavioral: Negative for depression and substance abuse.  All other systems reviewed and are negative.  Objective   Vitals with BMI 10/02/2019 09/23/2019 09/23/2019  Height 5\' 2"  - -  Weight 315 lbs 6 oz - -  BMI [id number] - -  Systolic [id number] 98 [id number]  Diastolic 66 72 95  Pulse 62 78 82     Physical Exam  Constitutional:  Moderately built and Morbidly obese in no acute distress. Has difficulty in breathing and joint  pain with trying to get on exam table  HENT:  Head: Atraumatic.  Eyes: Conjunctivae are normal.  Neck: Neck supple. No thyromegaly present.  Short neck and difficult to evaluate JVP  Cardiovascular: Normal rate. An irregularly irregular rhythm present. Exam reveals no gallop.  No murmur heard. Pulses:      Carotid pulses are 2+ on the right side and 2+ on the left side.      Dorsalis pedis pulses are 2+ on the right side and 2+ on the left side.       Posterior tibial pulses are 2+ on the right side and 2+ on the left side.  S1 variable and S2 normal. Distant HS.  Femoral and popliteal pulse difficult to feel due to patient's body habitus.   2 plus leg edema, pitting.  JVD difficult to make out due to short neck  Pulmonary/Chest: Effort normal and breath sounds normal.  Abdominal: Soft. Bowel sounds are normal.  Obese. Pannus present  Musculoskeletal:        General: No tenderness or deformity.  Neurological: She is alert.  Skin: Skin is warm and dry.  Psychiatric: She has a normal mood and affect.   Laboratory examination:    CMP Latest Ref Rng & Units 09/23/2019 09/18/2019 12/29/2018  Glucose 70 - 99 mg/dL 109(H) 118(H) 89  BUN 8 - 23 mg/dL 19 16 16   Creatinine 0.44 - 1.00 mg/dL 1.00 0.83 0.91  Sodium 135 - 145 mmol/L 140 142  139  Potassium 3.5 - 5.1 mmol/L 3.8 4.3 4.1  Chloride 98 - 111 mmol/L 99 103 106  CO2 22 - 32 mmol/L 31 28 27   Calcium 8.9 - 10.3 mg/dL 9.9 9.0 9.3  Total Protein 6.5 - 8.1 g/dL 7.4 - 7.1  Total Bilirubin 0.3 - 1.2 mg/dL 0.5 - 0.5  Alkaline Phos 38 - 126 U/L 81 - 77  AST 15 - 41 U/L 28 - 17  ALT 0 - 44 U/L 30 - 15   CBC Latest Ref Rng & Units 09/23/2019 12/29/2018 02/16/2018  WBC 4.0 - 10.5 K/uL 7.7 7.0 6.6  Hemoglobin 12.0 - 15.0 g/dL 14.8 14.6 13.9  Hematocrit 36.0 - 46.0 % 47.3(H) 45.8 43.4  Platelets 150 - 400 K/uL 261 257 196   Lipid Panel     Component Value Date/Time   CHOL 170 07/22/2009 0127   TRIG 129 07/22/2009 0127   HDL 48 07/22/2009 0127   CHOLHDL 3.5 Ratio 07/22/2009 0127   VLDL 26 07/22/2009 0127   LDLCALC 96 07/22/2009 0127   BNP    Component Value Date/Time   BNP 74.5 09/23/2019 1328    HEMOGLOBIN A1C Lab Results  Component Value Date   HGBA1C 6.3 (H) 09/02/2012   MPG 134 (H) 09/02/2012   TSH No results for input(s): TSH in the last 8760 hours. Medications and allergies   Allergies  Allergen Reactions  . Meperidine Rash and Other (See Comments)    Reaction:  Bottoms out BP Other reaction(s): other/intolerance Hypotension   . Atorvastatin Other (See Comments)    Reaction:  Muscle pain   . Febuxostat Other (See Comments)    Reaction:  Visual changes  Other reaction(s): neurological reaction Visual changes Reaction:Visual changes   . Simvastatin     Other reaction(s): other/intolerance Elevated liver enzymes   . Statins     Other reaction(s): muscle/joint pain  . Zocor [Simvastatin - High Dose] Other (See Comments)    Reaction:  Elevated liver enzymes  Prior to Admission medications   Medication Sig Start Date End Date Taking? Authorizing Provider  allopurinol (ZYLOPRIM) 100 MG tablet Take 1 tablet by mouth daily. 10/17/17  Yes [provider]  amitriptyline (ELAVIL) 25 MG tablet Take 1 tablet by mouth at bedtime. 08/25/19   Yes [provider]  apixaban (ELIQUIS) 5 MG TABS tablet Take 5 mg by mouth 2 (two) times daily.   Yes [provider]  Ergocalciferol (VITAMIN D2) 2000 units TABS Take 1 tablet by mouth daily.   Yes [provider]  esomeprazole (NEXIUM) 40 MG capsule Take 40 mg by mouth every evening.    Yes [provider]  levothyroxine (SYNTHROID, LEVOTHROID) 50 MCG tablet Take 50 mcg by mouth daily before breakfast.   Yes [provider]  metoprolol tartrate (LOPRESSOR) 50 MG tablet Take 50 mg by mouth 2 (two) times daily.   Yes [provider]  miconazole (MICOTIN) 2 % cream Apply 1 application topically as needed.   Yes [provider]  nystatin-triamcinolone ointment (MYCOLOG) Apply 1 application topically as needed. 10/29/17  Yes [provider]  Polyethyl Glycol-Propyl Glycol (SYSTANE OP) Apply 2 drops to eye as needed. In both eyes   Yes [provider]     Current Outpatient Medications  Medication Instructions  . allopurinol (ZYLOPRIM) 100 MG tablet 1 tablet, Oral, As needed  . amitriptyline (ELAVIL) 25 MG tablet 1 tablet, Oral, Daily at bedtime  . apixaban (ELIQUIS) 5 mg, Oral, 2 times daily  . Ergocalciferol (VITAMIN D2) 2000 units TABS 1 tablet, Oral, Daily  . esomeprazole (NEXIUM) 40 mg, Oral, Every evening  . fluticasone (FLONASE) 50 MCG/ACT nasal spray Nasal, As needed  . levothyroxine (SYNTHROID) 50 mcg, Oral, Daily before breakfast  . LORazepam (ATIVAN) 1.5 mg, Oral, 3 times daily  . metoprolol succinate (TOPROL-XL) 100 mg, Oral, Daily, Take with or immediately following a meal.  . miconazole (MICOTIN) 2 % cream 1 application, Topical, As needed  . nystatin-triamcinolone ointment (MYCOLOG) 1 application, Topical, As needed  . Polyethyl Glycol-Propyl Glycol (SYSTANE OP) 2 drops, Ophthalmic, As needed, In both eyes  . spironolactone (ALDACTONE) 25 mg, Oral, Daily  . torsemide (DEMADEX) 20 mg, Oral, 2 times  daily    Radiology:  No results found.  Cardiac Studies:   Echocardiogram 09/20/2016: Left ventricle cavity is normal in size. Mild concentric hypertrophy of the left ventricle. Low normal LV systolic function. Doppler evidence of grade II (pseudonormal) diastolic dysfunction, elevated LAP. Visual EF is 50-55%. Calculated EF 48%. Left atrial cavity is severely dilated at 5.0 CM Mild (Grade I) mitral regurgitation. Mild tricuspid regurgitation. Mild pulmonary hypertension. Pulmonary artery systolic pressure is estimated at 31 mm Hg. Compared to the study done on 04/14/2013, previously normal LV systolic function. Otherwise no significant change. Right ventricle is not well visualized.  Lexiscan myoview stress test 03/29/2017: Two day protocol followed due to patient's bodily habitus. 1. The resting electrocardiogram demonstrated atrial fibrillation and normal rest repolarization, low voltage complexes. Stress EKG is non-diagnostic for ischemia as it a pharmacologic stress using Lexiscan. 2. REST and STRESS images demonstrate decreased tracer uptake in the basal inferoseptal, basal inferior, mid inferoseptal and apical segments of the left ventricle. These defects are related to breast attenuation. Mild ischemia in this region cannot be completely excluded. Gated SPECT images reveal normal myocardial thickening and wall motion. The left ventricular ejection fraction was calculated or visually estimated to be 60%. This is a low risk study, clinical correlation recommended in  view of patient's bodily habitus.  Assessment     ICD-10-CM   1. Persistent atrial fibrillation (HCC)  I48.19   2. Acute on chronic diastolic heart failure (HCC)  I50.33 PCV ECHOCARDIOGRAM COMPLETE    torsemide (DEMADEX) 20 MG tablet    DISCONTINUED: torsemide (DEMADEX) 20 MG tablet  3. Class 3 severe obesity due to excess calories with serious comorbidity and body mass index (BMI) of 50.0 to 59.9 in adult [facility])  E66.01     Z68.43     Meds ordered this encounter  Medications  . DISCONTD: torsemide (DEMADEX) 20 MG tablet    Sig: Take 1 tablet (20 mg total) by mouth 2 (two) times daily.    Dispense:  60 tablet    Refill:  1    Discontinue Furosemide  . torsemide (DEMADEX) 20 MG tablet    Sig: Take 1 tablet (20 mg total) by mouth 2 (two) times daily.    Dispense:  60 tablet    Refill:  1    Discontinue Furosemide    EKG 09/17/2019: Atrial fibrillation with controlled ventricular response at the rate of 90 bpm, normal axis, poor R wave progression, cannot exclude anteroseptal infarct old.  Low-voltage complexes.  Nonspecific T abnormality.  Consider pulmonary disease pattern. No significant change from  EKG 09/18/2018     Recommendations:   Patient is here for 2 week OV chronic diastolic heart failure, permanent atrial fibrillation, morbid obesity. Past medical history significant for hypertension, obstructive sleep apnea, depression.  Patient has gained significant amount of weight, BMI has gone from 49-54, she is in acute decompensated heart failure, diastolic. On her last Visit I had increased lasix, she also has had ED visit and d/c after IV lasix, I will change to torsemide today as she may have developed diuretic resistance.    Will schedule for an echocardiogram. I have again discussed weight loss. OV in 6 weeks.   Adrian Prows, MD, [facility] 10/02/2019, 11:39 AM [place]. [place] Pager: [phone] Office: [phone] If no answer Cell [phone]

## 2019-10-09 ENCOUNTER — Ambulatory Visit (INDEPENDENT_AMBULATORY_CARE_PROVIDER_SITE_OTHER): Payer: Medicare Other

## 2019-10-09 ENCOUNTER — Other Ambulatory Visit: Payer: Medicare Other

## 2019-10-09 ENCOUNTER — Other Ambulatory Visit: Payer: Self-pay

## 2019-10-09 DIAGNOSIS — I5033 Acute on chronic diastolic (congestive) heart failure: Secondary | ICD-10-CM | POA: Diagnosis not present

## 2019-10-20 ENCOUNTER — Other Ambulatory Visit: Payer: Self-pay

## 2019-10-26 DIAGNOSIS — E782 Mixed hyperlipidemia: Secondary | ICD-10-CM | POA: Diagnosis not present

## 2019-10-26 DIAGNOSIS — I10 Essential (primary) hypertension: Secondary | ICD-10-CM | POA: Diagnosis not present

## 2019-10-30 DIAGNOSIS — R269 Unspecified abnormalities of gait and mobility: Secondary | ICD-10-CM | POA: Diagnosis not present

## 2019-10-30 DIAGNOSIS — M539 Dorsopathy, unspecified: Secondary | ICD-10-CM | POA: Diagnosis not present

## 2019-10-30 DIAGNOSIS — I509 Heart failure, unspecified: Secondary | ICD-10-CM | POA: Diagnosis not present

## 2019-10-30 DIAGNOSIS — M545 Low back pain: Secondary | ICD-10-CM | POA: Diagnosis not present

## 2019-11-03 DIAGNOSIS — R32 Unspecified urinary incontinence: Secondary | ICD-10-CM | POA: Diagnosis not present

## 2019-11-03 DIAGNOSIS — N3281 Overactive bladder: Secondary | ICD-10-CM | POA: Diagnosis not present

## 2019-11-06 ENCOUNTER — Other Ambulatory Visit: Payer: Self-pay | Admitting: Cardiology

## 2019-11-06 DIAGNOSIS — R269 Unspecified abnormalities of gait and mobility: Secondary | ICD-10-CM | POA: Diagnosis not present

## 2019-11-06 DIAGNOSIS — M545 Low back pain: Secondary | ICD-10-CM | POA: Diagnosis not present

## 2019-11-12 NOTE — Progress Notes (Signed)
Primary Physician/Referring:  Denny Levy, PA  Patient ID: [name], female    DOB: May 01, 1941, 78 y.o.   MRN: [id number]  Chief Complaint  Patient presents with  . Shortness of Breath  . Congestive Heart Failure   HPI:    [name]  is a 78 y.o. Caucasian female with persistent atrial fibrillation, morbid obesity, depression, chronic diastolic CHF, hypertension, frequency and urgency of urination that is chronic, very sensitive to medications.  Patient has had acute acute decompensated diastolic heart failure over the past 2 months.  She was changed to torsemide and underwent echocardiogram.  She now presents for follow-up. Since adding torsemide, dyspnea is improved, leg edema has improved as well.  She has started to feel better and thinks she is getting back to baseline.  No chest pain or palpitation.  Past Medical History:  Diagnosis Date  . A-fib ([place])   . Allergic rhinitis   . Anxiety   . Asthma    pt states she "believe it is more allergies"  . CHF (congestive heart failure) ([place])   . Cholecystitis   . Chronic abdominal pain   . Colon polyp    TCS 2005 TICS [place], TCS/PROPOFOL 2011 SIMPLE [place]  . Depression   . Diverticulosis 2005  . Fatty liver   . Fibromyalgia   . GERD (gastroesophageal reflux disease)   . Gout   . Hyperlipemia   . Hypothyroidism   . Internal hemorrhoids    H/o Rectal bleeding secondary to internal hemorrhoids 04/2010.  Marland Kitchen MCI (mild cognitive impairment) 12/30/2017  . Migraine   . Narcolepsy   . Obesity, morbid (more than 100 lbs over ideal weight or BMI > 40) (HCC)   . Obstructive sleep apnea on CPAP and O2  . Osteoarthritis   . Persistent atrial fibrillation ([place])    a. s/p DCCV 01/2012  . Volume overload    uses torsemide PRN   Past Surgical History:  Procedure Laterality Date  . ABDOMINAL HYSTERECTOMY  FIBROIDS 1982  . BLADDER SUSPENSION  1982  . CARDIOVERSION  02/05/2012   Procedure: CARDIOVERSION;  Surgeon: Laverda Page, MD;  Location: [place];  Service: Cardiovascular;  Laterality: N/A;  . CARDIOVERSION N/A 06/09/2013   Procedure: CARDIOVERSION;  Surgeon: Laverda Page, MD;  Location[facility] ENDOSCOPY;  Service: Cardiovascular;  Laterality: N/A;  h&p in file-[place]  . CARDIOVERSION N/A 08/09/2015   Procedure: CARDIOVERSION;  Surgeon: Adrian Prows, MD;  Location: [facility] ENDOSCOPY;  Service: Cardiovascular;  Laterality: N/A;  . CARDIOVERSION N/A 09/13/2015   Procedure: CARDIOVERSION;  Surgeon: Adrian Prows, MD;  Location: [facility] ENDOSCOPY;  Service: Cardiovascular;  Laterality: N/A;  . CHOLECYSTECTOMY  09/03/2012   Procedure: LAPAROSCOPIC CHOLECYSTECTOMY;  Surgeon: Jamesetta So, MD;  Location: [facility];  Service: General;  Laterality: N/A;  . COLONOSCOPY  2005   Dr. Tamala Julian: sigmoid diverticulosis  . COLONOSCOPY  June 2011   Dr. Oneida Alar: internal hemorrhoids, simple adenomas, hyperplastic polyps, needs surveillance in June 2014 with overtube and Propofol  . COLONOSCOPY WITH PROPOFOL N/A 03/19/2016   Dr. Gala Romney: multiple small tubular adenomas, diverticulosis. Surveillance in 5 years   . ERCP  10/01/2012   Procedure: ENDOSCOPIC RETROGRADE CHOLANGIOPANCREATOGRAPHY (ERCP);  Surgeon: Daneil Dolin, MD;  Location: [facility];  Service: Endoscopy;  Laterality: N/A;  . ESOPHAGOGASTRODUODENOSCOPY (EGD) WITH PROPOFOL N/A 03/19/2016   DR. Rourk: normal esohpagus with small hiatal hernia   . Kidney Stent  1995  . POLYPECTOMY  03/19/2016   Procedure:  POLYPECTOMY;  Surgeon: Daneil Dolin, MD;  [facility];  Service: Endoscopy;;  cold snares  . REMOVAL OF STONES  10/01/2012   Procedure: REMOVAL OF STONES;  Surgeon: Daneil Dolin, MD;  Location: [facility];  Service: Endoscopy;  Laterality: N/A;  . RHINOPLASTY    . SPHINCTEROTOMY  10/01/2012   Procedure: SPHINCTEROTOMY;  Surgeon: Daneil Dolin, MD;  Location: [facility];  Service: Endoscopy;  Laterality: N/A;   Social History   Socioeconomic History  . Marital status: Widowed    Spouse  name: Widowed  . Number of children: 2  . Years of education: GED  . Highest education level: Not on file  Occupational History  . Occupation: Homemaker  Tobacco Use  . Smoking status: Former Smoker    Packs/day: 0.50    Years: 30.00    Pack years: 15.00    Quit date: 10/03/1989    Years since quitting: 30.1  . Smokeless tobacco: Never Used  . Tobacco comment: Quit in 19-Mar-1991  Substance and Sexual Activity  . Alcohol use: No    Alcohol/week: 0.0 standard drinks  . Drug use: No  . Sexual activity: Not on file  Other Topics Concern  . Not on file  Social History Narrative   Lives with daughter   Caffeine use: none   Right handed    Widowed. Husband passed away Mar 18, 2016        Social Determinants of Health   Financial Resource Strain:   . Difficulty of Paying Living Expenses: Not on file  Food Insecurity:   . Worried About Charity fundraiser in the Last Year: Not on file  . Ran Out of Food in the Last Year: Not on file  Transportation Needs:   . Lack of Transportation (Medical): Not on file  . Lack of Transportation (Non-Medical): Not on file  Physical Activity:   . Days of Exercise per Week: Not on file  . Minutes of Exercise per Session: Not on file  Stress:   . Feeling of Stress : Not on file  Social Connections:   . Frequency of Communication with Friends and Family: Not on file  . Frequency of Social Gatherings with Friends and Family: Not on file  . Attends Religious Services: Not on file  . Active Member of Clubs or Organizations: Not on file  . Attends Archivist Meetings: Not on file  . Marital Status: Not on file  Intimate Partner Violence:   . Fear of Current or Ex-Partner: Not on file  . Emotionally Abused: Not on file  . Physically Abused: Not on file  . Sexually Abused: Not on file   ROS  Review of Systems  Constitution: Positive for malaise/fatigue and weight gain. Negative for chills and decreased appetite.  Cardiovascular: Positive for  dyspnea on exertion and leg swelling. Negative for syncope.  Respiratory: Positive for snoring (on CPAP).   Endocrine: Negative for cold intolerance.  Hematologic/Lymphatic: Does not bruise/bleed easily.  Musculoskeletal: Negative for joint swelling.  Gastrointestinal: Negative for abdominal pain, anorexia, diarrhea, hematochezia and melena.  Genitourinary: Positive for bladder incontinence and frequency (chronic).  Neurological: Negative for headaches and light-headedness.  Psychiatric/Behavioral: Negative for depression and substance abuse.  All other systems reviewed and are negative.  Objective   Vitals with BMI 11/13/2019 10/02/2019 09/23/2019  Height 5\' 2"  5\' 2"  -  Weight 315 lbs 315 lbs 6 oz -  BMI 123456 [id number] -  Systolic 123456 123456 98  Diastolic 72  66 72  Pulse 74 62 78     Physical Exam  Constitutional:  Moderately built and Morbidly obese in no acute distress.  HENT:  Head: Atraumatic.  Eyes: Conjunctivae are normal.  Neck: No thyromegaly present.  Short neck and difficult to evaluate JVP  Cardiovascular: Normal rate. An irregularly irregular rhythm present. Exam reveals no gallop.  No murmur heard. Pulses:      Carotid pulses are 2+ on the right side and 2+ on the left side.      Dorsalis pedis pulses are 2+ on the right side and 2+ on the left side.       Posterior tibial pulses are 2+ on the right side and 2+ on the left side.  S1 variable and S2 normal. Distant HS.  Femoral and popliteal pulse difficult to feel due to patient's body habitus.   2 plus leg edema, pitting.  JVD difficult to make out due to short neck  Pulmonary/Chest: Effort normal and breath sounds normal.  Abdominal: Soft. Bowel sounds are normal.  Obese. Pannus present  Musculoskeletal:        General: No tenderness or deformity.     Cervical back: Neck supple.  Neurological: She is alert.  Skin: Skin is warm and dry.  Psychiatric: She has a normal mood and affect.   Laboratory examination:      CMP Latest Ref Rng & Units 10/01/2019 09/23/2019 09/18/2019  Glucose 65 - 99 mg/dL 112(H) 109(H) 118(H)  BUN 8 - 27 mg/dL 19 19 16   Creatinine 0.57 - 1.00 mg/dL 1.06(H) 1.00 0.83  Sodium 134 - 144 mmol/L 139 140 142  Potassium 3.5 - 5.2 mmol/L 4.1 3.8 4.3  Chloride 96 - 106 mmol/L 100 99 103  CO2 20 - 29 mmol/L 27 31 28   Calcium 8.7 - 10.3 mg/dL 9.6 9.9 9.0  Total Protein 6.5 - 8.1 g/dL - 7.4 -  Total Bilirubin 0.3 - 1.2 mg/dL - 0.5 -  Alkaline Phos 38 - 126 U/L - 81 -  AST 15 - 41 U/L - 28 -  ALT 0 - 44 U/L - 30 -   CBC Latest Ref Rng & Units 09/23/2019 12/29/2018 02/16/2018  WBC 4.0 - 10.5 K/uL 7.7 7.0 6.6  Hemoglobin 12.0 - 15.0 g/dL 14.8 14.6 13.9  Hematocrit 36.0 - 46.0 % 47.3(H) 45.8 43.4  Platelets 150 - 400 K/uL 261 257 196   Lipid Panel     Component Value Date/Time   CHOL 170 07/22/2009 0127   TRIG 129 07/22/2009 0127   HDL 48 07/22/2009 0127   CHOLHDL 3.5 Ratio 07/22/2009 0127   VLDL 26 07/22/2009 0127   LDLCALC 96 07/22/2009 0127   BNP    Component Value Date/Time   BNP 73.2 10/01/2019 1059   BNP 74.5 09/23/2019 1328    HEMOGLOBIN A1C Lab Results  Component Value Date   HGBA1C 6.3 (H) 09/02/2012   MPG 134 (H) 09/02/2012   TSH No results for input(s): TSH in the last 8760 hours. Medications and allergies   Allergies  Allergen Reactions  . Meperidine Rash and Other (See Comments)    Reaction:  Bottoms out BP Other reaction(s): other/intolerance Hypotension   . Atorvastatin Other (See Comments)    Reaction:  Muscle pain   . Febuxostat Other (See Comments)    Reaction:  Visual changes  Other reaction(s): neurological reaction Visual changes Reaction:Visual changes   . Simvastatin     Other reaction(s): other/intolerance Elevated liver enzymes   . Statins  Other reaction(s): muscle/joint pain  . Zocor [Simvastatin - High Dose] Other (See Comments)    Reaction:  Elevated liver enzymes     Prior to Admission medications   Medication  Sig Start Date End Date Taking? Authorizing Provider  allopurinol (ZYLOPRIM) 100 MG tablet Take 1 tablet by mouth daily. 10/17/17  Yes [provider]  amitriptyline (ELAVIL) 25 MG tablet Take 1 tablet by mouth at bedtime. 08/25/19  Yes [provider]  apixaban (ELIQUIS) 5 MG TABS tablet Take 5 mg by mouth 2 (two) times daily.   Yes [provider]  Ergocalciferol (VITAMIN D2) 2000 units TABS Take 1 tablet by mouth daily.   Yes [provider]  esomeprazole (NEXIUM) 40 MG capsule Take 40 mg by mouth every evening.    Yes [provider]  levothyroxine (SYNTHROID, LEVOTHROID) 50 MCG tablet Take 50 mcg by mouth daily before breakfast.   Yes [provider]  metoprolol tartrate (LOPRESSOR) 50 MG tablet Take 50 mg by mouth 2 (two) times daily.   Yes [provider]  miconazole (MICOTIN) 2 % cream Apply 1 application topically as needed.   Yes [provider]  nystatin-triamcinolone ointment (MYCOLOG) Apply 1 application topically as needed. 10/29/17  Yes [provider]  Polyethyl Glycol-Propyl Glycol (SYSTANE OP) Apply 2 drops to eye as needed. In both eyes   Yes [provider]     Current Outpatient Medications  Medication Instructions  . allopurinol (ZYLOPRIM) 100 MG tablet 1 tablet, Oral, Daily  . apixaban (ELIQUIS) 5 mg, Oral, 2 times daily  . Ergocalciferol (VITAMIN D2) 2000 units TABS 1 tablet, Oral, Daily  . esomeprazole (NEXIUM) 40 mg, Oral, Every evening  . fluticasone (FLONASE) 50 MCG/ACT nasal spray Nasal, As needed  . levothyroxine (SYNTHROID) 50 mcg, Oral, Daily before breakfast  . LORazepam (ATIVAN) 1.5 mg, Oral, 3 times daily  . metoprolol succinate (TOPROL-XL) 100 mg, Oral, Daily, Take with or immediately following a meal.  . miconazole (MICOTIN) 2 % cream 1 application, Topical, As needed  . nystatin-triamcinolone ointment (MYCOLOG) 1 application, Topical, As needed  . Polyethyl  Glycol-Propyl Glycol (SYSTANE OP) 2 drops, Ophthalmic, As needed, In both eyes  . spironolactone (ALDACTONE) 25 mg, Oral, Daily  . tiotropium (SPIRIVA) 18 mcg, Inhalation, Daily  . torsemide (DEMADEX) 20 mg, Oral, 2 times daily    Radiology:  No results found.  Cardiac Studies:   Echocardiogram 10/09/2019: Left ventricle cavity is normal in size. Moderate concentric hypertrophy of the left ventricle. Mild global hypokinesis. EF 45-50%. Unable to evaluate diastolic function due to atrial fibrillation.  Left atrial cavity is severely dilated. Moderate (Grade III) mitral regurgitation. Mild to moderate tricuspid regurgitation. Estimated pulmonary artery systolic pressure is 28 mmHg.   Compared to previous study on 09/20/2018, EF is reduced from 50-55% to 45-50%.  Lexiscan myoview stress test 03/29/2017: Two day protocol followed due to patient's bodily habitus. 1. The resting electrocardiogram demonstrated atrial fibrillation and normal rest repolarization, low voltage complexes. Stress EKG is non-diagnostic for ischemia as it a pharmacologic stress using Lexiscan. 2. REST and STRESS images demonstrate decreased tracer uptake in the basal inferoseptal, basal inferior, mid inferoseptal and apical segments of the left ventricle. These defects are related to breast attenuation. Mild ischemia in this region cannot be completely excluded. Gated SPECT images reveal normal myocardial thickening and wall motion. The left ventricular ejection fraction was calculated or visually estimated to be 60%. This is a low risk study, clinical correlation recommended in  view of patient's bodily habitus.  Assessment     ICD-10-CM   1. Permanent atrial fibrillation (HCC)  I48.21     CHA2DS2-VASc Score is 4.  Yearly risk of stroke: 4% (A, F, HTN)  2. Acute on chronic diastolic heart failure (HCC)  I50.33   3. Class 3 severe obesity due to excess calories with serious comorbidity and body mass index (BMI) of  50.0 to 59.9 in adult [facility])  E66.01    Z68.43     No orders of the defined types were placed in this encounter.   EKG 09/17/2019: Atrial fibrillation with controlled ventricular response at the rate of 90 bpm, normal axis, poor R wave progression, cannot exclude anteroseptal infarct old.  Low-voltage complexes.  Nonspecific T abnormality.  Consider pulmonary disease pattern. No significant change from  EKG 09/18/2018     Recommendations:   [name]  is a 78 y.o. Caucasian female with persistent atrial fibrillation, morbid obesity, depression, chronic diastolic CHF, hypertension, frequency and urgency of urination that is chronic, very sensitive to medications.  Patient has gained significant amount of weight, BMI has gone from 49 to 57, since changing furosemide to torsemide Symptoms of dyspnea is improved, today she is in well compensated diastolic heart failure without any rhonchi in her lungs, leg edema is stable, dyspnea is improved.  I reviewed the echocardiogram, she has developed decreased LVEF.  Underlying coronary artery disease need to be excluded however would not recommend aggressive measures and lifestyle modification was discussed at length with the patient and her granddaughter at the bedside.  In view of her body size, it'll be extremely difficult to perform stress test and CT angiogram may not be appropriate.  I would not want to do heart catheterization unless necessary. Weight loss was again extensively discussed with the patient and her family.  I will see her back in 6 months or sooner if problems.  Adrian Prows, MD, [facility] 11/13/2019, 4:58 PM [facility]. [place] Pager: [phone] Office: [phone] If no answer Cell [phone]

## 2019-11-13 ENCOUNTER — Ambulatory Visit (INDEPENDENT_AMBULATORY_CARE_PROVIDER_SITE_OTHER): Payer: Medicare Other | Admitting: Cardiology

## 2019-11-13 ENCOUNTER — Encounter: Payer: Self-pay | Admitting: Cardiology

## 2019-11-13 ENCOUNTER — Other Ambulatory Visit: Payer: Self-pay

## 2019-11-13 VITALS — BP 118/72 | HR 74 | Ht 62.0 in | Wt 315.0 lb

## 2019-11-13 DIAGNOSIS — I5033 Acute on chronic diastolic (congestive) heart failure: Secondary | ICD-10-CM

## 2019-11-13 DIAGNOSIS — Z6841 Body Mass Index (BMI) 40.0 and over, adult: Secondary | ICD-10-CM

## 2019-11-13 DIAGNOSIS — I4821 Permanent atrial fibrillation: Secondary | ICD-10-CM | POA: Diagnosis not present

## 2019-12-03 DIAGNOSIS — J449 Chronic obstructive pulmonary disease, unspecified: Secondary | ICD-10-CM | POA: Diagnosis not present

## 2019-12-14 ENCOUNTER — Other Ambulatory Visit: Payer: Self-pay | Admitting: Cardiology

## 2019-12-25 DIAGNOSIS — I480 Paroxysmal atrial fibrillation: Secondary | ICD-10-CM | POA: Diagnosis not present

## 2019-12-25 DIAGNOSIS — I10 Essential (primary) hypertension: Secondary | ICD-10-CM | POA: Diagnosis not present

## 2020-01-07 ENCOUNTER — Ambulatory Visit: Payer: Medicare Other | Attending: Internal Medicine

## 2020-01-07 DIAGNOSIS — Z23 Encounter for immunization: Secondary | ICD-10-CM | POA: Insufficient documentation

## 2020-01-07 NOTE — Progress Notes (Signed)
   Covid-19 Vaccination Clinic  Name:  [name]    MRN: [id number] DOB: Nov 26, 1941  01/07/2020  [name] was observed post Covid-19 immunization for 15 minutes without incidence. She was provided with Vaccine Information Sheet and instruction to access the V-Safe system.   [name] was instructed to call 911 with any severe reactions post vaccine: Marland Kitchen Difficulty breathing  . Swelling of your face and throat  . A fast heartbeat  . A bad rash all over your body  . Dizziness and weakness    Immunizations Administered    Name Date Dose VIS Date Route   Pfizer COVID-19 Vaccine 01/07/2020 12:15 PM 0.3 mL 11/06/2019 Intramuscular   Manufacturer: [place]   Lot: [id number]   [place]: [id number]

## 2020-01-13 DIAGNOSIS — K5904 Chronic idiopathic constipation: Secondary | ICD-10-CM | POA: Diagnosis not present

## 2020-01-13 DIAGNOSIS — R140 Abdominal distension (gaseous): Secondary | ICD-10-CM | POA: Diagnosis not present

## 2020-01-18 DIAGNOSIS — Z9049 Acquired absence of other specified parts of digestive tract: Secondary | ICD-10-CM | POA: Diagnosis not present

## 2020-01-18 DIAGNOSIS — K760 Fatty (change of) liver, not elsewhere classified: Secondary | ICD-10-CM | POA: Diagnosis not present

## 2020-01-18 DIAGNOSIS — R140 Abdominal distension (gaseous): Secondary | ICD-10-CM | POA: Diagnosis not present

## 2020-01-20 DIAGNOSIS — R32 Unspecified urinary incontinence: Secondary | ICD-10-CM | POA: Diagnosis not present

## 2020-01-20 DIAGNOSIS — N3281 Overactive bladder: Secondary | ICD-10-CM | POA: Diagnosis not present

## 2020-01-22 DIAGNOSIS — E7849 Other hyperlipidemia: Secondary | ICD-10-CM | POA: Diagnosis not present

## 2020-01-22 DIAGNOSIS — I10 Essential (primary) hypertension: Secondary | ICD-10-CM | POA: Diagnosis not present

## 2020-01-25 DIAGNOSIS — L03116 Cellulitis of left lower limb: Secondary | ICD-10-CM

## 2020-01-25 DIAGNOSIS — L02416 Cutaneous abscess of left lower limb: Secondary | ICD-10-CM

## 2020-01-25 HISTORY — DX: Cellulitis of left lower limb: L03.116

## 2020-01-25 HISTORY — DX: Cutaneous abscess of left lower limb: L02.416

## 2020-01-30 ENCOUNTER — Ambulatory Visit: Payer: Medicare Other | Attending: Internal Medicine

## 2020-01-30 DIAGNOSIS — Z23 Encounter for immunization: Secondary | ICD-10-CM | POA: Insufficient documentation

## 2020-01-30 NOTE — Progress Notes (Signed)
   Covid-19 Vaccination Clinic  Name:  [name]    MRN: [id number] DOB: Dec 20, 1940  01/30/2020  [name] was observed post Covid-19 immunization for 15 minutes without incident. She was provided with Vaccine Information Sheet and instruction to access the V-Safe system.   [name] was instructed to call 911 with any severe reactions post vaccine: Marland Kitchen Difficulty breathing  . Swelling of face and throat  . A fast heartbeat  . A bad rash all over body  . Dizziness and weakness   Immunizations Administered    Name Date Dose VIS Date Route   Pfizer COVID-19 Vaccine 01/30/2020 12:03 PM 0.3 mL 11/06/2019 Intramuscular   Manufacturer: [place]   Lot: [id number]   [place]: [id number]

## 2020-02-12 DIAGNOSIS — G8929 Other chronic pain: Secondary | ICD-10-CM | POA: Diagnosis not present

## 2020-02-12 DIAGNOSIS — Z6841 Body Mass Index (BMI) 40.0 and over, adult: Secondary | ICD-10-CM | POA: Diagnosis not present

## 2020-02-12 DIAGNOSIS — M545 Low back pain: Secondary | ICD-10-CM | POA: Diagnosis not present

## 2020-02-16 DIAGNOSIS — Z79899 Other long term (current) drug therapy: Secondary | ICD-10-CM | POA: Diagnosis not present

## 2020-02-16 DIAGNOSIS — R52 Pain, unspecified: Secondary | ICD-10-CM | POA: Diagnosis not present

## 2020-02-16 DIAGNOSIS — R2242 Localized swelling, mass and lump, left lower limb: Secondary | ICD-10-CM | POA: Diagnosis not present

## 2020-02-16 DIAGNOSIS — Z888 Allergy status to other drugs, medicaments and biological substances status: Secondary | ICD-10-CM | POA: Diagnosis not present

## 2020-02-16 DIAGNOSIS — L539 Erythematous condition, unspecified: Secondary | ICD-10-CM | POA: Diagnosis not present

## 2020-02-16 DIAGNOSIS — Z87891 Personal history of nicotine dependence: Secondary | ICD-10-CM | POA: Diagnosis not present

## 2020-02-16 DIAGNOSIS — L03116 Cellulitis of left lower limb: Secondary | ICD-10-CM | POA: Diagnosis not present

## 2020-02-16 DIAGNOSIS — E7800 Pure hypercholesterolemia, unspecified: Secondary | ICD-10-CM | POA: Diagnosis not present

## 2020-02-16 DIAGNOSIS — I4891 Unspecified atrial fibrillation: Secondary | ICD-10-CM | POA: Diagnosis not present

## 2020-02-18 ENCOUNTER — Encounter: Payer: Self-pay | Admitting: Cardiology

## 2020-02-22 DIAGNOSIS — R1084 Generalized abdominal pain: Secondary | ICD-10-CM | POA: Diagnosis not present

## 2020-02-22 DIAGNOSIS — L03116 Cellulitis of left lower limb: Secondary | ICD-10-CM | POA: Diagnosis not present

## 2020-02-22 DIAGNOSIS — Z6841 Body Mass Index (BMI) 40.0 and over, adult: Secondary | ICD-10-CM | POA: Diagnosis not present

## 2020-02-22 DIAGNOSIS — I509 Heart failure, unspecified: Secondary | ICD-10-CM | POA: Diagnosis not present

## 2020-02-22 DIAGNOSIS — I482 Chronic atrial fibrillation, unspecified: Secondary | ICD-10-CM | POA: Diagnosis not present

## 2020-02-24 DIAGNOSIS — E7849 Other hyperlipidemia: Secondary | ICD-10-CM | POA: Diagnosis not present

## 2020-02-24 DIAGNOSIS — R140 Abdominal distension (gaseous): Secondary | ICD-10-CM | POA: Diagnosis not present

## 2020-02-24 DIAGNOSIS — I10 Essential (primary) hypertension: Secondary | ICD-10-CM | POA: Diagnosis not present

## 2020-02-24 DIAGNOSIS — K5904 Chronic idiopathic constipation: Secondary | ICD-10-CM | POA: Diagnosis not present

## 2020-02-26 ENCOUNTER — Other Ambulatory Visit: Payer: Self-pay | Admitting: Cardiology

## 2020-02-26 DIAGNOSIS — I4819 Other persistent atrial fibrillation: Secondary | ICD-10-CM

## 2020-03-01 ENCOUNTER — Other Ambulatory Visit: Payer: Self-pay

## 2020-03-01 DIAGNOSIS — I4819 Other persistent atrial fibrillation: Secondary | ICD-10-CM

## 2020-03-01 MED ORDER — METOPROLOL SUCCINATE ER 100 MG PO TB24: 100.0000 mg | ORAL_TABLET | Freq: Every day | ORAL | 1 refills | Status: DC

## 2020-03-02 DIAGNOSIS — R32 Unspecified urinary incontinence: Secondary | ICD-10-CM | POA: Diagnosis not present

## 2020-03-02 DIAGNOSIS — N3281 Overactive bladder: Secondary | ICD-10-CM | POA: Diagnosis not present

## 2020-03-07 ENCOUNTER — Encounter: Payer: Self-pay | Admitting: Cardiology

## 2020-03-08 DIAGNOSIS — M109 Gout, unspecified: Secondary | ICD-10-CM | POA: Diagnosis not present

## 2020-03-08 DIAGNOSIS — Z6841 Body Mass Index (BMI) 40.0 and over, adult: Secondary | ICD-10-CM | POA: Diagnosis not present

## 2020-03-08 DIAGNOSIS — R269 Unspecified abnormalities of gait and mobility: Secondary | ICD-10-CM | POA: Diagnosis not present

## 2020-03-08 DIAGNOSIS — M79671 Pain in right foot: Secondary | ICD-10-CM | POA: Diagnosis not present

## 2020-03-14 DIAGNOSIS — M48062 Spinal stenosis, lumbar region with neurogenic claudication: Secondary | ICD-10-CM | POA: Diagnosis not present

## 2020-03-14 DIAGNOSIS — M4726 Other spondylosis with radiculopathy, lumbar region: Secondary | ICD-10-CM | POA: Diagnosis not present

## 2020-03-14 DIAGNOSIS — M5136 Other intervertebral disc degeneration, lumbar region: Secondary | ICD-10-CM | POA: Diagnosis not present

## 2020-04-06 DIAGNOSIS — Z7901 Long term (current) use of anticoagulants: Secondary | ICD-10-CM | POA: Diagnosis not present

## 2020-04-06 DIAGNOSIS — M1990 Unspecified osteoarthritis, unspecified site: Secondary | ICD-10-CM | POA: Diagnosis not present

## 2020-04-06 DIAGNOSIS — Z79890 Hormone replacement therapy (postmenopausal): Secondary | ICD-10-CM | POA: Diagnosis not present

## 2020-04-06 DIAGNOSIS — Z6841 Body Mass Index (BMI) 40.0 and over, adult: Secondary | ICD-10-CM | POA: Diagnosis not present

## 2020-04-06 DIAGNOSIS — E785 Hyperlipidemia, unspecified: Secondary | ICD-10-CM | POA: Diagnosis not present

## 2020-04-06 DIAGNOSIS — G894 Chronic pain syndrome: Secondary | ICD-10-CM | POA: Diagnosis not present

## 2020-04-06 DIAGNOSIS — M5136 Other intervertebral disc degeneration, lumbar region: Secondary | ICD-10-CM | POA: Diagnosis not present

## 2020-04-06 DIAGNOSIS — M797 Fibromyalgia: Secondary | ICD-10-CM | POA: Diagnosis not present

## 2020-04-06 DIAGNOSIS — Z87891 Personal history of nicotine dependence: Secondary | ICD-10-CM | POA: Diagnosis not present

## 2020-04-06 DIAGNOSIS — G4733 Obstructive sleep apnea (adult) (pediatric): Secondary | ICD-10-CM | POA: Diagnosis not present

## 2020-04-06 DIAGNOSIS — K219 Gastro-esophageal reflux disease without esophagitis: Secondary | ICD-10-CM | POA: Diagnosis not present

## 2020-04-06 DIAGNOSIS — G4730 Sleep apnea, unspecified: Secondary | ICD-10-CM | POA: Diagnosis not present

## 2020-04-06 DIAGNOSIS — I482 Chronic atrial fibrillation, unspecified: Secondary | ICD-10-CM | POA: Diagnosis not present

## 2020-04-06 DIAGNOSIS — E039 Hypothyroidism, unspecified: Secondary | ICD-10-CM | POA: Diagnosis not present

## 2020-04-06 DIAGNOSIS — N3281 Overactive bladder: Secondary | ICD-10-CM | POA: Diagnosis not present

## 2020-04-06 DIAGNOSIS — K760 Fatty (change of) liver, not elsewhere classified: Secondary | ICD-10-CM | POA: Diagnosis not present

## 2020-04-06 DIAGNOSIS — Z9989 Dependence on other enabling machines and devices: Secondary | ICD-10-CM | POA: Diagnosis not present

## 2020-04-06 DIAGNOSIS — Z79899 Other long term (current) drug therapy: Secondary | ICD-10-CM | POA: Diagnosis not present

## 2020-04-06 DIAGNOSIS — N393 Stress incontinence (female) (male): Secondary | ICD-10-CM | POA: Diagnosis not present

## 2020-04-12 DIAGNOSIS — R32 Unspecified urinary incontinence: Secondary | ICD-10-CM | POA: Diagnosis not present

## 2020-04-14 DIAGNOSIS — M47816 Spondylosis without myelopathy or radiculopathy, lumbar region: Secondary | ICD-10-CM | POA: Diagnosis not present

## 2020-04-14 DIAGNOSIS — M48062 Spinal stenosis, lumbar region with neurogenic claudication: Secondary | ICD-10-CM | POA: Diagnosis not present

## 2020-04-14 DIAGNOSIS — Z6841 Body Mass Index (BMI) 40.0 and over, adult: Secondary | ICD-10-CM | POA: Diagnosis not present

## 2020-04-14 DIAGNOSIS — M545 Low back pain: Secondary | ICD-10-CM | POA: Diagnosis not present

## 2020-04-14 DIAGNOSIS — M5136 Other intervertebral disc degeneration, lumbar region: Secondary | ICD-10-CM | POA: Diagnosis not present

## 2020-04-14 DIAGNOSIS — G894 Chronic pain syndrome: Secondary | ICD-10-CM | POA: Diagnosis not present

## 2020-04-29 DIAGNOSIS — M47816 Spondylosis without myelopathy or radiculopathy, lumbar region: Secondary | ICD-10-CM | POA: Diagnosis not present

## 2020-05-13 ENCOUNTER — Other Ambulatory Visit: Payer: Self-pay

## 2020-05-13 ENCOUNTER — Ambulatory Visit: Payer: Medicare Other | Admitting: Cardiology

## 2020-05-13 ENCOUNTER — Encounter: Payer: Self-pay | Admitting: Cardiology

## 2020-05-13 VITALS — BP 122/63 | HR 67 | Ht 62.0 in | Wt 312.6 lb

## 2020-05-13 DIAGNOSIS — I4821 Permanent atrial fibrillation: Secondary | ICD-10-CM

## 2020-05-13 DIAGNOSIS — Z6841 Body Mass Index (BMI) 40.0 and over, adult: Secondary | ICD-10-CM

## 2020-05-13 DIAGNOSIS — I5032 Chronic diastolic (congestive) heart failure: Secondary | ICD-10-CM

## 2020-05-13 NOTE — Progress Notes (Signed)
Primary Physician/Referring:  Denny Levy, PA  Patient ID: [name], female    DOB: 1941/06/14, 79 y.o.   MRN: [id number]  Chief Complaint  Patient presents with  . Follow-up  . Congestive Heart Failure  . Atrial Fibrillation   HPI:    [name]  is a 79 y.o. Caucasian female with permanent atrial fibrillation, morbid obesity, depression, chronic diastolic CHF, hypertension, frequency and urgency of urination that is chronic, very sensitive to medications.  She presents for a 40-month office visit, has no mobility surface leg edema, accompanied by her granddaughter and in a wheelchair.  She has gained weight. Since adding torsemide, dyspnea is improved, leg edema has improved as well.  No chest pain or palpitation.  Past Medical History:  Diagnosis Date  . A-fib ([place])   . Allergic rhinitis   . Anxiety   . Asthma    pt states she "believe it is more allergies"  . Cellulitis and abscess of left leg 01/2020  . CHF (congestive heart failure) ([place])   . Cholecystitis   . Chronic abdominal pain   . Colon polyp    TCS 2005 TICS [place], TCS/PROPOFOL 2011 SIMPLE [place]  . Depression   . Diverticulosis 2005  . Fatty liver   . Fibromyalgia   . GERD (gastroesophageal reflux disease)   . Gout   . Hyperlipemia   . Hypothyroidism   . Internal hemorrhoids    H/o Rectal bleeding secondary to internal hemorrhoids 04/2010.  Marland Kitchen MCI (mild cognitive impairment) 12/30/2017  . Migraine   . Narcolepsy   . Obesity, morbid (more than 100 lbs over ideal weight or BMI > 40) (HCC)   . Obstructive sleep apnea on CPAP and O2  . Osteoarthritis   . Persistent atrial fibrillation ([place])    a. s/p DCCV 01/2012  . Volume overload    uses torsemide PRN   Past Surgical History:  Procedure Laterality Date  . ABDOMINAL HYSTERECTOMY  FIBROIDS 1982  . BLADDER SUSPENSION  1982  . CARDIOVERSION  02/05/2012   Procedure: CARDIOVERSION;  Surgeon: Laverda Page, MD;  Location: [place];  Service:  Cardiovascular;  Laterality: N/A;  . CARDIOVERSION N/A 06/09/2013   Procedure: CARDIOVERSION;  Surgeon: Laverda Page, MD;  Location: [facility] ENDOSCOPY;  Service: Cardiovascular;  Laterality: N/A;  h&p in file-[place]  . CARDIOVERSION N/A 08/09/2015   Procedure: CARDIOVERSION;  Surgeon: Adrian Prows, MD;  Location: [facility] ENDOSCOPY;  Service: Cardiovascular;  Laterality: N/A;  . CARDIOVERSION N/A 09/13/2015   Procedure: CARDIOVERSION;  Surgeon: Adrian Prows, MD;  Location: [facility] ENDOSCOPY;  Service: Cardiovascular;  Laterality: N/A;  . CHOLECYSTECTOMY  09/03/2012   Procedure: LAPAROSCOPIC CHOLECYSTECTOMY;  Surgeon: Jamesetta So, MD;  Location: [facility];  Service: General;  Laterality: N/A;  . COLONOSCOPY  2005   Dr. Tamala Julian: sigmoid diverticulosis  . COLONOSCOPY  June 2011   Dr. Oneida Alar: internal hemorrhoids, simple adenomas, hyperplastic polyps, needs surveillance in June 2014 with overtube and Propofol  . COLONOSCOPY WITH PROPOFOL N/A 03/19/2016   Dr. Gala Romney: multiple small tubular adenomas, diverticulosis. Surveillance in 5 years   . ERCP  10/01/2012   Procedure: ENDOSCOPIC RETROGRADE CHOLANGIOPANCREATOGRAPHY (ERCP);  Surgeon: Daneil Dolin, MD;  Location: [facility];  Service: Endoscopy;  Laterality: N/A;  . ESOPHAGOGASTRODUODENOSCOPY (EGD) WITH PROPOFOL N/A 03/19/2016   DR. Rourk: normal esohpagus with small hiatal hernia   . Kidney Stent  1995  . POLYPECTOMY  03/19/2016   Procedure: POLYPECTOMY;  Surgeon: Daneil Dolin, MD;  Location[facility];  Service: Endoscopy;;  cold snares  . REMOVAL OF STONES  10/01/2012   Procedure: REMOVAL OF STONES;  Surgeon: Daneil Dolin, MD;  Location: [facility];  Service: Endoscopy;  Laterality: N/A;  . RHINOPLASTY    . SPHINCTEROTOMY  10/01/2012   Procedure: SPHINCTEROTOMY;  Surgeon: Daneil Dolin, MD;  Location: [facility];  Service: Endoscopy;  Laterality: N/A;   Social History   Tobacco Use  . Smoking status: Former Smoker    Packs/day: 0.50    Years: 30.00    Pack years:  15.00    Quit date: 10/03/1989    Years since quitting: 30.6  . Smokeless tobacco: Never Used  . Tobacco comment: Quit in 1992  Substance Use Topics  . Alcohol use: No    Alcohol/week: 0.0 standard drinks    Marital Status: Widowed   ROS  Review of Systems  Constitutional: Positive for malaise/fatigue. Negative for chills and decreased appetite.  Cardiovascular: Positive for dyspnea on exertion. Negative for leg swelling and syncope.  Respiratory: Positive for snoring (on CPAP).   Endocrine: Negative for cold intolerance.  Hematologic/Lymphatic: Does not bruise/bleed easily.  Musculoskeletal: Positive for arthritis and back pain. Negative for joint swelling.  Gastrointestinal: Negative for abdominal pain, anorexia, diarrhea, hematochezia and melena.  Genitourinary: Positive for bladder incontinence and frequency (chronic).  Neurological: Negative for headaches and light-headedness.  Psychiatric/Behavioral: Negative for depression and substance abuse.  All other systems reviewed and are negative.  Objective   Vitals with BMI 05/13/2020 11/13/2019 10/02/2019  Height 5\' 2"  5\' 2"  5\' 2"   Weight 312 lbs 10 oz 315 lbs 315 lbs 6 oz  BMI 57.16 10.1 75.10  Systolic 258 527 782  Diastolic 63 72 66  Pulse 67 74 62     Physical Exam Constitutional:      Comments: Moderately built and Morbidly obese in no acute distress.  Neck:     Thyroid: No thyromegaly.     Comments: Short neck and difficult to evaluate JVP Cardiovascular:     Rate and Rhythm: Normal rate. Rhythm irregular.     Pulses:          Carotid pulses are 2+ on the right side and 2+ on the left side.      Dorsalis pedis pulses are 2+ on the right side and 2+ on the left side.       Posterior tibial pulses are 2+ on the right side and 2+ on the left side.     Heart sounds: No murmur heard.  No gallop.      Comments: S1 variable and S2 normal. Distant HS.  Femoral and popliteal pulse difficult to feel due to patient's body  habitus.  No leg edema. Large adipose tissue noted. JVD difficult to make out due to short neck Pulmonary:     Effort: Pulmonary effort is normal.     Breath sounds: Normal breath sounds.  Abdominal:     General: Bowel sounds are normal.     Palpations: Abdomen is soft.     Comments: Obese. Pannus present  Musculoskeletal:        General: No tenderness or deformity.    Laboratory examination:    CMP Latest Ref Rng & Units 10/01/2019 09/23/2019 09/18/2019  Glucose 65 - 99 mg/dL 112(H) 109(H) 118(H)  BUN 8 - 27 mg/dL 19 19 16   Creatinine 0.57 - 1.00 mg/dL 1.06(H) 1.00 0.83  Sodium 134 - 144 mmol/L 139 140 142  Potassium 3.5 -  5.2 mmol/L 4.1 3.8 4.3  Chloride 96 - 106 mmol/L 100 99 103  CO2 20 - 29 mmol/L 27 31 28   Calcium 8.7 - 10.3 mg/dL 9.6 9.9 9.0  Total Protein 6.5 - 8.1 g/dL - 7.4 -  Total Bilirubin 0.3 - 1.2 mg/dL - 0.5 -  Alkaline Phos 38 - 126 U/L - 81 -  AST 15 - 41 U/L - 28 -  ALT 0 - 44 U/L - 30 -   CBC Latest Ref Rng & Units 09/23/2019 12/29/2018 02/16/2018  WBC 4.0 - 10.5 K/uL 7.7 7.0 6.6  Hemoglobin 12.0 - 15.0 g/dL 14.8 14.6 13.9  Hematocrit 36 - 46 % 47.3(H) 45.8 43.4  Platelets 150 - 400 K/uL 261 257 196   Lipid Panel     Component Value Date/Time   CHOL 170 07/22/2009 0127   TRIG 129 07/22/2009 0127   HDL 48 07/22/2009 0127   CHOLHDL 3.5 Ratio 07/22/2009 0127   VLDL 26 07/22/2009 0127   LDLCALC 96 07/22/2009 0127   BNP    Component Value Date/Time   BNP 73.2 10/01/2019 1059   BNP 74.5 09/23/2019 1328    HEMOGLOBIN A1C Lab Results  Component Value Date   HGBA1C 6.3 (H) 09/02/2012   MPG 134 (H) 09/02/2012   TSH No results for input(s): TSH in the last 8760 hours. Medications and allergies   Allergies  Allergen Reactions  . Meperidine Rash and Other (See Comments)    Reaction:  Bottoms out BP Other reaction(s): other/intolerance Hypotension   . Atorvastatin Other (See Comments)    Reaction:  Muscle pain   . Febuxostat Other (See  Comments)    Reaction:  Visual changes  Other reaction(s): neurological reaction Visual changes Reaction:Visual changes   . Simvastatin     Other reaction(s): other/intolerance Elevated liver enzymes   . Statins     Other reaction(s): muscle/joint pain  . Zocor [Simvastatin - High Dose] Other (See Comments)    Reaction:  Elevated liver enzymes     Current Outpatient Medications  Medication Instructions  . allopurinol (ZYLOPRIM) 100 MG tablet 1 tablet, Oral, Daily  . bifidobacterium infantis (ALIGN) capsule 1 capsule, Oral, Daily  . ELIQUIS 5 MG TABS tablet TAKE 1 TABLET TWICE A DAY  . Ergocalciferol (VITAMIN D2) 2000 units TABS 1 tablet, Oral, Daily  . esomeprazole (NEXIUM) 40 mg, Oral, Every evening  . fluticasone (FLONASE) 50 MCG/ACT nasal spray Nasal, As needed  . levothyroxine (SYNTHROID) 50 mcg, Oral, Daily before breakfast  . linaclotide (LINZESS) 72 MCG capsule Oral  . LORazepam (ATIVAN) 1.5 mg, Oral, 3 times daily  . metoprolol succinate (TOPROL-XL) 100 MG 24 hr tablet TAKE 1 TABLET DAILY. TAKE WITH OR IMMEDIATELY FOLLOWING A MEAL (DISCONTINUE METOPROLOL TARTRATE)  . miconazole (MICOTIN) 2 % cream 1 application, Topical, As needed  . nystatin-triamcinolone ointment (MYCOLOG) 1 application, Topical, As needed  . Polyethyl Glycol-Propyl Glycol (SYSTANE OP) 2 drops, Ophthalmic, As needed, In both eyes  . spironolactone (ALDACTONE) 25 mg, Oral, Daily  . tiotropium (SPIRIVA) 18 mcg, Inhalation, Daily  . torsemide (DEMADEX) 20 mg, Oral, 2 times daily    Radiology:  No results found.  Cardiac Studies:   Echocardiogram 10/09/2019: Left ventricle cavity is normal in size. Moderate concentric hypertrophy of the left ventricle. Mild global hypokinesis. EF 45-50%. Unable to evaluate diastolic function due to atrial fibrillation.  Left atrial cavity is severely dilated. Moderate (Grade III) mitral regurgitation. Mild to moderate tricuspid regurgitation. Estimated pulmonary  artery systolic pressure  is 28 mmHg.   Compared to previous study on 09/20/2018, EF is reduced from 50-55% to 45-50%.  Lexiscan myoview stress test 03/29/2017: Two day protocol followed due to patient's bodily habitus. 1. The resting electrocardiogram demonstrated atrial fibrillation and normal rest repolarization, low voltage complexes. Stress EKG is non-diagnostic for ischemia as it a pharmacologic stress using Lexiscan. 2. REST and STRESS images demonstrate decreased tracer uptake in the basal inferoseptal, basal inferior, mid inferoseptal and apical segments of the left ventricle. These defects are related to breast attenuation. Mild ischemia in this region cannot be completely excluded. Gated SPECT images reveal normal myocardial thickening and wall motion. The left ventricular ejection fraction was calculated or visually estimated to be 60%. This is a low risk study, clinical correlation recommended in view of patient's bodily habitus.  EKG 05/13/2020: Atrial fibrillation with controlled ventricular response at rate of 71 bpm, leftward axis, cannot exclude inferior infarct old, anterolateral infarct old.  Low-voltage complexes.  Pulmonary disease pattern.  No significant change from 09/17/2019.  Assessment     ICD-10-CM   1. Permanent atrial fibrillation (HCC)  I48.21 EKG 12-Lead  2. Class 3 severe obesity due to excess calories with serious comorbidity and body mass index (BMI) of 50.0 to 59.9 in adult ([place])  E66.01    Z68.43   3. Chronic diastolic (congestive) heart failure (HCC)  I50.32     CHA2DS2-VASc Score is 4.  Yearly risk of stroke: 4% (A, F, HTN, CHF).  Score of 1=1.3; 2=2.2; 3=3.2; 4=4; 5=6.7; 6=9.8; 7=>9.8) -(CHF; HTN; vasc disease DM,  Female = 1; Age <65 =0; 65-74 = 1,  >75 =2; stroke = 2).    No orders of the defined types were placed in this encounter.  Recommendations:   [name]  is a 79 y.o. Caucasian female with permanent atrial fibrillation, morbid obesity,  depression, chronic diastolic CHF, hypertension, frequency and urgency of urination that is chronic, very sensitive to medications.  She presents for a 60-month office visit, has no mobility surface leg edema, accompanied by her granddaughter and in a wheelchair.  No clinical evidence of heart failure although she has gained weight.  Today there was no JVD, no leg edema, lung examination is clear.  She is having morbid obesity and arthritis.  No change in the medications were done today, advised her that although her LVEF was mildly reduced echocardiogram 10/01/2019, today there is no clinical evidence heart filling pressures well controlled, but she should continue to do physical therapy and try to regain her strength back.  I will see her back in 6 months for follow-up.  Continue anticoagulation with Eliquis, continue metoprolol both for rate control and hypertension.    Adrian Prows, MD, [facility] 05/15/2020, 3:24 PM [facility]. [place] Pager: [phone] Office: [phone]

## 2020-05-19 DIAGNOSIS — J309 Allergic rhinitis, unspecified: Secondary | ICD-10-CM | POA: Diagnosis not present

## 2020-05-19 DIAGNOSIS — I480 Paroxysmal atrial fibrillation: Secondary | ICD-10-CM | POA: Diagnosis not present

## 2020-05-19 DIAGNOSIS — F330 Major depressive disorder, recurrent, mild: Secondary | ICD-10-CM | POA: Diagnosis not present

## 2020-05-19 DIAGNOSIS — R300 Dysuria: Secondary | ICD-10-CM | POA: Diagnosis not present

## 2020-05-19 DIAGNOSIS — Z6841 Body Mass Index (BMI) 40.0 and over, adult: Secondary | ICD-10-CM | POA: Diagnosis not present

## 2020-05-31 DIAGNOSIS — G4733 Obstructive sleep apnea (adult) (pediatric): Secondary | ICD-10-CM | POA: Diagnosis not present

## 2020-05-31 DIAGNOSIS — Z713 Dietary counseling and surveillance: Secondary | ICD-10-CM | POA: Diagnosis not present

## 2020-05-31 DIAGNOSIS — Z9989 Dependence on other enabling machines and devices: Secondary | ICD-10-CM | POA: Diagnosis not present

## 2020-05-31 DIAGNOSIS — Z6841 Body Mass Index (BMI) 40.0 and over, adult: Secondary | ICD-10-CM | POA: Diagnosis not present

## 2020-06-08 DIAGNOSIS — M109 Gout, unspecified: Secondary | ICD-10-CM | POA: Diagnosis not present

## 2020-06-08 DIAGNOSIS — M797 Fibromyalgia: Secondary | ICD-10-CM | POA: Diagnosis not present

## 2020-06-08 DIAGNOSIS — Z7901 Long term (current) use of anticoagulants: Secondary | ICD-10-CM | POA: Diagnosis not present

## 2020-06-08 DIAGNOSIS — Z9181 History of falling: Secondary | ICD-10-CM | POA: Diagnosis not present

## 2020-06-08 DIAGNOSIS — F419 Anxiety disorder, unspecified: Secondary | ICD-10-CM | POA: Diagnosis not present

## 2020-06-08 DIAGNOSIS — Z9981 Dependence on supplemental oxygen: Secondary | ICD-10-CM | POA: Diagnosis not present

## 2020-06-08 DIAGNOSIS — I4821 Permanent atrial fibrillation: Secondary | ICD-10-CM | POA: Diagnosis not present

## 2020-06-08 DIAGNOSIS — I110 Hypertensive heart disease with heart failure: Secondary | ICD-10-CM | POA: Diagnosis not present

## 2020-06-08 DIAGNOSIS — E039 Hypothyroidism, unspecified: Secondary | ICD-10-CM | POA: Diagnosis not present

## 2020-06-08 DIAGNOSIS — F329 Major depressive disorder, single episode, unspecified: Secondary | ICD-10-CM | POA: Diagnosis not present

## 2020-06-08 DIAGNOSIS — E785 Hyperlipidemia, unspecified: Secondary | ICD-10-CM | POA: Diagnosis not present

## 2020-06-08 DIAGNOSIS — I5032 Chronic diastolic (congestive) heart failure: Secondary | ICD-10-CM | POA: Diagnosis not present

## 2020-06-08 DIAGNOSIS — R2681 Unsteadiness on feet: Secondary | ICD-10-CM | POA: Diagnosis not present

## 2020-06-10 DIAGNOSIS — F329 Major depressive disorder, single episode, unspecified: Secondary | ICD-10-CM | POA: Diagnosis not present

## 2020-06-10 DIAGNOSIS — I110 Hypertensive heart disease with heart failure: Secondary | ICD-10-CM | POA: Diagnosis not present

## 2020-06-10 DIAGNOSIS — I4821 Permanent atrial fibrillation: Secondary | ICD-10-CM | POA: Diagnosis not present

## 2020-06-10 DIAGNOSIS — M797 Fibromyalgia: Secondary | ICD-10-CM | POA: Diagnosis not present

## 2020-06-10 DIAGNOSIS — E039 Hypothyroidism, unspecified: Secondary | ICD-10-CM | POA: Diagnosis not present

## 2020-06-10 DIAGNOSIS — I5032 Chronic diastolic (congestive) heart failure: Secondary | ICD-10-CM | POA: Diagnosis not present

## 2020-06-13 DIAGNOSIS — Z6841 Body Mass Index (BMI) 40.0 and over, adult: Secondary | ICD-10-CM | POA: Diagnosis not present

## 2020-06-13 DIAGNOSIS — E039 Hypothyroidism, unspecified: Secondary | ICD-10-CM | POA: Diagnosis not present

## 2020-06-13 DIAGNOSIS — I110 Hypertensive heart disease with heart failure: Secondary | ICD-10-CM | POA: Diagnosis not present

## 2020-06-13 DIAGNOSIS — I5032 Chronic diastolic (congestive) heart failure: Secondary | ICD-10-CM | POA: Diagnosis not present

## 2020-06-13 DIAGNOSIS — M797 Fibromyalgia: Secondary | ICD-10-CM | POA: Diagnosis not present

## 2020-06-13 DIAGNOSIS — I4821 Permanent atrial fibrillation: Secondary | ICD-10-CM | POA: Diagnosis not present

## 2020-06-13 DIAGNOSIS — F329 Major depressive disorder, single episode, unspecified: Secondary | ICD-10-CM | POA: Diagnosis not present

## 2020-06-13 DIAGNOSIS — M109 Gout, unspecified: Secondary | ICD-10-CM | POA: Diagnosis not present

## 2020-06-13 DIAGNOSIS — Z13220 Encounter for screening for lipoid disorders: Secondary | ICD-10-CM | POA: Diagnosis not present

## 2020-06-15 ENCOUNTER — Telehealth: Payer: Self-pay

## 2020-06-15 DIAGNOSIS — I4821 Permanent atrial fibrillation: Secondary | ICD-10-CM | POA: Diagnosis not present

## 2020-06-15 DIAGNOSIS — M797 Fibromyalgia: Secondary | ICD-10-CM | POA: Diagnosis not present

## 2020-06-15 DIAGNOSIS — I110 Hypertensive heart disease with heart failure: Secondary | ICD-10-CM | POA: Diagnosis not present

## 2020-06-15 DIAGNOSIS — F329 Major depressive disorder, single episode, unspecified: Secondary | ICD-10-CM | POA: Diagnosis not present

## 2020-06-15 DIAGNOSIS — I5032 Chronic diastolic (congestive) heart failure: Secondary | ICD-10-CM | POA: Diagnosis not present

## 2020-06-15 DIAGNOSIS — E039 Hypothyroidism, unspecified: Secondary | ICD-10-CM | POA: Diagnosis not present

## 2020-06-15 NOTE — Telephone Encounter (Signed)
If there is no leg edema, okay to take them PRN for leg swelling.  JG

## 2020-06-15 NOTE — Telephone Encounter (Signed)
[name] @ [facility] home [facility] called and wants to know if the patient should be spironolactone and torsemide. According to pt she is not taking either. I did not see where you stopped either of these meds but wanted to make sure.

## 2020-06-16 ENCOUNTER — Telehealth: Payer: Self-pay | Admitting: Cardiology

## 2020-06-16 DIAGNOSIS — I5032 Chronic diastolic (congestive) heart failure: Secondary | ICD-10-CM

## 2020-06-16 DIAGNOSIS — R5381 Other malaise: Secondary | ICD-10-CM | POA: Diagnosis not present

## 2020-06-16 DIAGNOSIS — I4821 Permanent atrial fibrillation: Secondary | ICD-10-CM | POA: Diagnosis not present

## 2020-06-16 DIAGNOSIS — Z6841 Body Mass Index (BMI) 40.0 and over, adult: Secondary | ICD-10-CM | POA: Diagnosis not present

## 2020-06-16 NOTE — Telephone Encounter (Signed)
I have reviewed the home health plan of care.  Chronic diastolic (congestive) heart failure (HCC)  Class 3 severe obesity due to excess calories with serious comorbidity and body mass index (BMI) of 50.0 to 59.9 in adult [facility])  Permanent atrial fibrillation ([place])  Morbid obesity due to excess calories [facility])  Physical deconditioning   Jennifer Prows, MD, [facility] 06/16/2020, 5:56 PM Office: [phone]

## 2020-06-17 DIAGNOSIS — I4821 Permanent atrial fibrillation: Secondary | ICD-10-CM | POA: Diagnosis not present

## 2020-06-17 DIAGNOSIS — I110 Hypertensive heart disease with heart failure: Secondary | ICD-10-CM | POA: Diagnosis not present

## 2020-06-17 DIAGNOSIS — M797 Fibromyalgia: Secondary | ICD-10-CM | POA: Diagnosis not present

## 2020-06-17 DIAGNOSIS — E039 Hypothyroidism, unspecified: Secondary | ICD-10-CM | POA: Diagnosis not present

## 2020-06-17 DIAGNOSIS — F329 Major depressive disorder, single episode, unspecified: Secondary | ICD-10-CM | POA: Diagnosis not present

## 2020-06-17 DIAGNOSIS — I5032 Chronic diastolic (congestive) heart failure: Secondary | ICD-10-CM | POA: Diagnosis not present

## 2020-06-17 DIAGNOSIS — M47816 Spondylosis without myelopathy or radiculopathy, lumbar region: Secondary | ICD-10-CM | POA: Diagnosis not present

## 2020-06-20 DIAGNOSIS — M545 Low back pain: Secondary | ICD-10-CM | POA: Diagnosis not present

## 2020-06-20 DIAGNOSIS — G8929 Other chronic pain: Secondary | ICD-10-CM | POA: Diagnosis not present

## 2020-06-20 DIAGNOSIS — Z6841 Body Mass Index (BMI) 40.0 and over, adult: Secondary | ICD-10-CM | POA: Diagnosis not present

## 2020-06-21 DIAGNOSIS — I110 Hypertensive heart disease with heart failure: Secondary | ICD-10-CM | POA: Diagnosis not present

## 2020-06-21 DIAGNOSIS — M797 Fibromyalgia: Secondary | ICD-10-CM | POA: Diagnosis not present

## 2020-06-21 DIAGNOSIS — E039 Hypothyroidism, unspecified: Secondary | ICD-10-CM | POA: Diagnosis not present

## 2020-06-21 DIAGNOSIS — F329 Major depressive disorder, single episode, unspecified: Secondary | ICD-10-CM | POA: Diagnosis not present

## 2020-06-21 DIAGNOSIS — I5032 Chronic diastolic (congestive) heart failure: Secondary | ICD-10-CM | POA: Diagnosis not present

## 2020-06-21 DIAGNOSIS — I4821 Permanent atrial fibrillation: Secondary | ICD-10-CM | POA: Diagnosis not present

## 2020-06-22 DIAGNOSIS — M797 Fibromyalgia: Secondary | ICD-10-CM | POA: Diagnosis not present

## 2020-06-22 DIAGNOSIS — F329 Major depressive disorder, single episode, unspecified: Secondary | ICD-10-CM | POA: Diagnosis not present

## 2020-06-22 DIAGNOSIS — I5032 Chronic diastolic (congestive) heart failure: Secondary | ICD-10-CM | POA: Diagnosis not present

## 2020-06-22 DIAGNOSIS — E039 Hypothyroidism, unspecified: Secondary | ICD-10-CM | POA: Diagnosis not present

## 2020-06-22 DIAGNOSIS — I110 Hypertensive heart disease with heart failure: Secondary | ICD-10-CM | POA: Diagnosis not present

## 2020-06-22 DIAGNOSIS — I4821 Permanent atrial fibrillation: Secondary | ICD-10-CM | POA: Diagnosis not present

## 2020-06-23 DIAGNOSIS — I4821 Permanent atrial fibrillation: Secondary | ICD-10-CM | POA: Diagnosis not present

## 2020-06-23 DIAGNOSIS — I110 Hypertensive heart disease with heart failure: Secondary | ICD-10-CM | POA: Diagnosis not present

## 2020-06-23 DIAGNOSIS — M797 Fibromyalgia: Secondary | ICD-10-CM | POA: Diagnosis not present

## 2020-06-23 DIAGNOSIS — F329 Major depressive disorder, single episode, unspecified: Secondary | ICD-10-CM | POA: Diagnosis not present

## 2020-06-23 DIAGNOSIS — E039 Hypothyroidism, unspecified: Secondary | ICD-10-CM | POA: Diagnosis not present

## 2020-06-23 DIAGNOSIS — I5032 Chronic diastolic (congestive) heart failure: Secondary | ICD-10-CM | POA: Diagnosis not present

## 2020-06-27 DIAGNOSIS — I110 Hypertensive heart disease with heart failure: Secondary | ICD-10-CM | POA: Diagnosis not present

## 2020-06-27 DIAGNOSIS — I5032 Chronic diastolic (congestive) heart failure: Secondary | ICD-10-CM | POA: Diagnosis not present

## 2020-06-27 DIAGNOSIS — I4821 Permanent atrial fibrillation: Secondary | ICD-10-CM | POA: Diagnosis not present

## 2020-06-27 DIAGNOSIS — M797 Fibromyalgia: Secondary | ICD-10-CM | POA: Diagnosis not present

## 2020-06-27 DIAGNOSIS — E039 Hypothyroidism, unspecified: Secondary | ICD-10-CM | POA: Diagnosis not present

## 2020-06-27 DIAGNOSIS — F329 Major depressive disorder, single episode, unspecified: Secondary | ICD-10-CM | POA: Diagnosis not present

## 2020-06-29 DIAGNOSIS — I110 Hypertensive heart disease with heart failure: Secondary | ICD-10-CM | POA: Diagnosis not present

## 2020-06-29 DIAGNOSIS — I5032 Chronic diastolic (congestive) heart failure: Secondary | ICD-10-CM | POA: Diagnosis not present

## 2020-06-29 DIAGNOSIS — M797 Fibromyalgia: Secondary | ICD-10-CM | POA: Diagnosis not present

## 2020-06-29 DIAGNOSIS — I4821 Permanent atrial fibrillation: Secondary | ICD-10-CM | POA: Diagnosis not present

## 2020-06-29 DIAGNOSIS — E039 Hypothyroidism, unspecified: Secondary | ICD-10-CM | POA: Diagnosis not present

## 2020-06-29 DIAGNOSIS — F329 Major depressive disorder, single episode, unspecified: Secondary | ICD-10-CM | POA: Diagnosis not present

## 2020-07-07 DIAGNOSIS — I5032 Chronic diastolic (congestive) heart failure: Secondary | ICD-10-CM | POA: Diagnosis not present

## 2020-07-07 DIAGNOSIS — M797 Fibromyalgia: Secondary | ICD-10-CM | POA: Diagnosis not present

## 2020-07-07 DIAGNOSIS — E039 Hypothyroidism, unspecified: Secondary | ICD-10-CM | POA: Diagnosis not present

## 2020-07-07 DIAGNOSIS — I4821 Permanent atrial fibrillation: Secondary | ICD-10-CM | POA: Diagnosis not present

## 2020-07-07 DIAGNOSIS — I110 Hypertensive heart disease with heart failure: Secondary | ICD-10-CM | POA: Diagnosis not present

## 2020-07-07 DIAGNOSIS — F329 Major depressive disorder, single episode, unspecified: Secondary | ICD-10-CM | POA: Diagnosis not present

## 2020-07-08 DIAGNOSIS — I110 Hypertensive heart disease with heart failure: Secondary | ICD-10-CM | POA: Diagnosis not present

## 2020-07-15 DIAGNOSIS — M5136 Other intervertebral disc degeneration, lumbar region: Secondary | ICD-10-CM | POA: Diagnosis not present

## 2020-07-15 DIAGNOSIS — Z5181 Encounter for therapeutic drug level monitoring: Secondary | ICD-10-CM | POA: Diagnosis not present

## 2020-07-15 DIAGNOSIS — M47816 Spondylosis without myelopathy or radiculopathy, lumbar region: Secondary | ICD-10-CM | POA: Diagnosis not present

## 2020-07-15 DIAGNOSIS — Z79891 Long term (current) use of opiate analgesic: Secondary | ICD-10-CM | POA: Diagnosis not present

## 2020-07-15 DIAGNOSIS — M545 Low back pain: Secondary | ICD-10-CM | POA: Diagnosis not present

## 2020-07-15 DIAGNOSIS — Z6841 Body Mass Index (BMI) 40.0 and over, adult: Secondary | ICD-10-CM | POA: Diagnosis not present

## 2020-07-15 DIAGNOSIS — M48062 Spinal stenosis, lumbar region with neurogenic claudication: Secondary | ICD-10-CM | POA: Diagnosis not present

## 2020-08-03 DIAGNOSIS — Z6841 Body Mass Index (BMI) 40.0 and over, adult: Secondary | ICD-10-CM | POA: Diagnosis not present

## 2020-08-03 DIAGNOSIS — R269 Unspecified abnormalities of gait and mobility: Secondary | ICD-10-CM | POA: Diagnosis not present

## 2020-08-03 DIAGNOSIS — M79671 Pain in right foot: Secondary | ICD-10-CM | POA: Diagnosis not present

## 2020-08-03 DIAGNOSIS — I509 Heart failure, unspecified: Secondary | ICD-10-CM | POA: Diagnosis not present

## 2020-08-03 DIAGNOSIS — R350 Frequency of micturition: Secondary | ICD-10-CM | POA: Diagnosis not present

## 2020-08-05 DIAGNOSIS — Z23 Encounter for immunization: Secondary | ICD-10-CM | POA: Diagnosis not present

## 2020-08-12 DIAGNOSIS — M5136 Other intervertebral disc degeneration, lumbar region: Secondary | ICD-10-CM | POA: Diagnosis not present

## 2020-08-12 DIAGNOSIS — M48062 Spinal stenosis, lumbar region with neurogenic claudication: Secondary | ICD-10-CM | POA: Diagnosis not present

## 2020-08-12 DIAGNOSIS — M545 Low back pain: Secondary | ICD-10-CM | POA: Diagnosis not present

## 2020-08-12 DIAGNOSIS — Z6841 Body Mass Index (BMI) 40.0 and over, adult: Secondary | ICD-10-CM | POA: Diagnosis not present

## 2020-08-12 DIAGNOSIS — M47816 Spondylosis without myelopathy or radiculopathy, lumbar region: Secondary | ICD-10-CM | POA: Diagnosis not present

## 2020-08-12 DIAGNOSIS — Z79891 Long term (current) use of opiate analgesic: Secondary | ICD-10-CM | POA: Diagnosis not present

## 2020-08-12 DIAGNOSIS — Z5181 Encounter for therapeutic drug level monitoring: Secondary | ICD-10-CM | POA: Diagnosis not present

## 2020-08-19 DIAGNOSIS — M1611 Unilateral primary osteoarthritis, right hip: Secondary | ICD-10-CM | POA: Diagnosis not present

## 2020-08-19 DIAGNOSIS — M25551 Pain in right hip: Secondary | ICD-10-CM | POA: Diagnosis not present

## 2020-08-19 DIAGNOSIS — G8929 Other chronic pain: Secondary | ICD-10-CM | POA: Diagnosis not present

## 2020-08-19 DIAGNOSIS — R29898 Other symptoms and signs involving the musculoskeletal system: Secondary | ICD-10-CM | POA: Diagnosis not present

## 2020-08-19 DIAGNOSIS — M47816 Spondylosis without myelopathy or radiculopathy, lumbar region: Secondary | ICD-10-CM | POA: Diagnosis not present

## 2020-08-19 DIAGNOSIS — M545 Low back pain: Secondary | ICD-10-CM | POA: Diagnosis not present

## 2020-09-15 DIAGNOSIS — M545 Low back pain, unspecified: Secondary | ICD-10-CM | POA: Diagnosis not present

## 2020-09-15 DIAGNOSIS — M5136 Other intervertebral disc degeneration, lumbar region: Secondary | ICD-10-CM | POA: Diagnosis not present

## 2020-09-15 DIAGNOSIS — M47816 Spondylosis without myelopathy or radiculopathy, lumbar region: Secondary | ICD-10-CM | POA: Diagnosis not present

## 2020-09-15 DIAGNOSIS — Z5181 Encounter for therapeutic drug level monitoring: Secondary | ICD-10-CM | POA: Diagnosis not present

## 2020-09-15 DIAGNOSIS — Z79891 Long term (current) use of opiate analgesic: Secondary | ICD-10-CM | POA: Diagnosis not present

## 2020-09-15 DIAGNOSIS — Z6841 Body Mass Index (BMI) 40.0 and over, adult: Secondary | ICD-10-CM | POA: Diagnosis not present

## 2020-09-15 DIAGNOSIS — G8929 Other chronic pain: Secondary | ICD-10-CM | POA: Diagnosis not present

## 2020-09-15 DIAGNOSIS — R29898 Other symptoms and signs involving the musculoskeletal system: Secondary | ICD-10-CM | POA: Diagnosis not present

## 2020-09-29 ENCOUNTER — Other Ambulatory Visit: Payer: Self-pay | Admitting: Cardiology

## 2020-09-29 DIAGNOSIS — I4819 Other persistent atrial fibrillation: Secondary | ICD-10-CM

## 2020-11-16 ENCOUNTER — Ambulatory Visit: Payer: Medicare Other | Admitting: Cardiology

## 2020-11-16 ENCOUNTER — Other Ambulatory Visit: Payer: Self-pay

## 2020-11-16 ENCOUNTER — Encounter: Payer: Self-pay | Admitting: Cardiology

## 2020-11-16 VITALS — BP 121/76 | HR 75 | Resp 12 | Ht 62.0 in | Wt 299.0 lb

## 2020-11-16 DIAGNOSIS — Z6841 Body Mass Index (BMI) 40.0 and over, adult: Secondary | ICD-10-CM

## 2020-11-16 DIAGNOSIS — I5032 Chronic diastolic (congestive) heart failure: Secondary | ICD-10-CM | POA: Diagnosis not present

## 2020-11-16 DIAGNOSIS — I4821 Permanent atrial fibrillation: Secondary | ICD-10-CM

## 2020-11-16 MED ORDER — TORSEMIDE 20 MG PO TABS: 20.0000 mg | ORAL_TABLET | Freq: Every day | ORAL | 1 refills | Status: DC | PRN

## 2020-11-16 NOTE — Progress Notes (Signed)
Primary Physician/Referring:  Lawerance Sabal, PA  Patient ID: [name], female    DOB: Dec 08, 1940, 79 y.o.   MRN: [id number]  Chief Complaint  Patient presents with  . Atrial Fibrillation  . Follow-up    6 month   HPI:    [name]  is a 79 y.o. Caucasian female with permanent atrial fibrillation, morbid obesity, depression, chronic diastolic CHF, hypertension, frequency and urgency of urination that is chronic, very sensitive to medications.  Presents for 6 month follow up accompanied by her granddaughter. Patient reports since her last visit she has had worsening back and hip pain for which she follows with orthopedics as well as spinal surgery. She is presently in a wheelchair, which she uses when she leaves the house. She walks with a walker around her home. Denies chest pain, palpitations, dizziness, orthopnea, PND. She does have occasional lower leg edema for which she takes torsemide as needed. She reports dyspnea on exertion that stable and chronic.   Past Medical History:  Diagnosis Date  . A-fib (HCC)   . Allergic rhinitis   . Anxiety   . Asthma    pt states she "believe it is more allergies"  . Cellulitis and abscess of left leg 01/2020  . CHF (congestive heart failure) (HCC)   . Cholecystitis   . Chronic abdominal pain   . Colon polyp    TCS 2005 TICS [place], TCS/PROPOFOL 2011 SIMPLE [place]  . Depression   . Diverticulosis 2005  . Fatty liver   . Fibromyalgia   . GERD (gastroesophageal reflux disease)   . Gout   . Hyperlipemia   . Hypothyroidism   . Internal hemorrhoids    H/o Rectal bleeding secondary to internal hemorrhoids 04/2010.  Marland Kitchen MCI (mild cognitive impairment) 12/30/2017  . Migraine   . Narcolepsy   . Obesity, morbid (more than 100 lbs over ideal weight or BMI > 40) (HCC)   . Obstructive sleep apnea on CPAP and O2  . Osteoarthritis   . Persistent atrial fibrillation (HCC)    a. s/p DCCV 01/2012  . Volume overload    uses torsemide PRN   Past  Surgical History:  Procedure Laterality Date  . ABDOMINAL HYSTERECTOMY  FIBROIDS 1982  . BLADDER SUSPENSION  1982  . CARDIOVERSION  02/05/2012   Procedure: CARDIOVERSION;  Surgeon: Pamella Pert, MD;  Location: [facility] OR;  Service: Cardiovascular;  Laterality: N/A;  . CARDIOVERSION N/A 06/09/2013   Procedure: CARDIOVERSION;  Surgeon: Pamella Pert, MD;  Location: [facility] ENDOSCOPY;  Service: Cardiovascular;  Laterality: N/A;  h&p in file-[place]  . CARDIOVERSION N/A 08/09/2015   Procedure: CARDIOVERSION;  Surgeon: Yates Decamp, MD;  Location: [facility] ENDOSCOPY;  Service: Cardiovascular;  Laterality: N/A;  . CARDIOVERSION N/A 09/13/2015   Procedure: CARDIOVERSION;  Surgeon: Yates Decamp, MD;  Location: [facility] ENDOSCOPY;  Service: Cardiovascular;  Laterality: N/A;  . CHOLECYSTECTOMY  09/03/2012   Procedure: LAPAROSCOPIC CHOLECYSTECTOMY;  Surgeon: Dalia Heading, MD;  Location: [facility] ORS;  Service: General;  Laterality: N/A;  . COLONOSCOPY  2005   Dr. Katrinka Blazing: sigmoid diverticulosis  . COLONOSCOPY  June 2011   Dr. Darrick Penna: internal hemorrhoids, simple adenomas, hyperplastic polyps, needs surveillance in June 2014 with overtube and Propofol  . COLONOSCOPY WITH PROPOFOL N/A 03/19/2016   Dr. Jena Gauss: multiple small tubular adenomas, diverticulosis. Surveillance in 5 years   . ERCP  10/01/2012   Procedure: ENDOSCOPIC RETROGRADE CHOLANGIOPANCREATOGRAPHY (ERCP);  Surgeon: Corbin Ade, MD;  Location: [facility] ORS;  Service:  Endoscopy;  Laterality: N/A;  . ESOPHAGOGASTRODUODENOSCOPY (EGD) WITH PROPOFOL N/A 03/19/2016   DR. Rourk: normal esohpagus with small hiatal hernia   . Kidney Stent  1995  . POLYPECTOMY  03/19/2016   Procedure: POLYPECTOMY;  Surgeon: Daneil Dolin, MD;  Location: [facility] ENDO SUITE;  Service: Endoscopy;;  cold snares  . REMOVAL OF STONES  10/01/2012   Procedure: REMOVAL OF STONES;  Surgeon: Daneil Dolin, MD;  Location: [facility] ORS;  Service: Endoscopy;  Laterality: N/A;  . RHINOPLASTY    . SPHINCTEROTOMY  10/01/2012    Procedure: SPHINCTEROTOMY;  Surgeon: Daneil Dolin, MD;  Location: [facility] ORS;  Service: Endoscopy;  Laterality: N/A;   Social History   Tobacco Use  . Smoking status: Former Smoker    Packs/day: 0.50    Years: 30.00    Pack years: 15.00    Quit date: 10/03/1989    Years since quitting: 31.1  . Smokeless tobacco: Never Used  . Tobacco comment: Quit in 1992  Substance Use Topics  . Alcohol use: No    Alcohol/week: 0.0 standard drinks    Marital Status: Widowed   ROS  Review of Systems  Constitutional: Negative for chills, decreased appetite and malaise/fatigue.  Cardiovascular: Positive for dyspnea on exertion (stable ). Negative for chest pain, leg swelling, orthopnea, palpitations, paroxysmal nocturnal dyspnea and syncope.  Respiratory: Positive for snoring (on CPAP).   Endocrine: Negative for cold intolerance.  Hematologic/Lymphatic: Does not bruise/bleed easily.  Musculoskeletal: Positive for arthritis and back pain. Negative for joint swelling.  Gastrointestinal: Negative for abdominal pain, anorexia, diarrhea, hematochezia and melena.  Genitourinary: Positive for bladder incontinence and frequency (chronic).  Neurological: Negative for headaches and light-headedness.  Psychiatric/Behavioral: Negative for depression and substance abuse.  All other systems reviewed and are negative.  Objective   Vitals with BMI 11/16/2020 05/13/2020 11/13/2019  Height 5\' 2"  5\' 2"  5\' 2"   Weight 299 lbs 312 lbs 10 oz 315 lbs  BMI 54.67 [id number]  Systolic 123XX123 [id number]  Diastolic 76 63 72  Pulse 75 67 74     Physical Exam Constitutional:      Comments: Moderately built and Morbidly obese in no acute distress.  Neck:     Thyroid: No thyromegaly.     Comments: Short neck and difficult to evaluate JVP Cardiovascular:     Rate and Rhythm: Normal rate. Rhythm irregular.     Pulses:          Carotid pulses are 2+ on the right side and 2+ on the left side.      Dorsalis pedis pulses are 2+ on  the right side and 2+ on the left side.       Posterior tibial pulses are 2+ on the right side and 2+ on the left side.     Heart sounds: No murmur heard. No gallop.      Comments: S1 variable and S2 normal. Distant HS.  Femoral and popliteal pulse difficult to feel due to patient's body habitus.  No leg edema. Large adipose tissue noted. JVD difficult to make out due to short neck Pulmonary:     Effort: Pulmonary effort is normal.     Breath sounds: Normal breath sounds.  Abdominal:     General: Bowel sounds are normal.     Palpations: Abdomen is soft.     Comments: Obese. Pannus present  Musculoskeletal:        General: No tenderness or deformity.  Skin:    General: Skin  is warm and dry.    Laboratory examination:    CMP Latest Ref Rng & Units 10/01/2019 09/23/2019 09/18/2019  Glucose 65 - 99 mg/dL 112(H) 109(H) 118(H)  BUN 8 - 27 mg/dL 19 19 16   Creatinine 0.57 - 1.00 mg/dL 1.06(H) 1.00 0.83  Sodium 134 - 144 mmol/L 139 140 142  Potassium 3.5 - 5.2 mmol/L 4.1 3.8 4.3  Chloride 96 - 106 mmol/L 100 99 103  CO2 20 - 29 mmol/L 27 31 28   Calcium 8.7 - 10.3 mg/dL 9.6 9.9 9.0  Total Protein 6.5 - 8.1 g/dL - 7.4 -  Total Bilirubin 0.3 - 1.2 mg/dL - 0.5 -  Alkaline Phos 38 - 126 U/L - 81 -  AST 15 - 41 U/L - 28 -  ALT 0 - 44 U/L - 30 -   CBC Latest Ref Rng & Units 09/23/2019 12/29/2018 02/16/2018  WBC 4.0 - 10.5 K/uL 7.7 7.0 6.6  Hemoglobin 12.0 - 15.0 g/dL 14.8 14.6 13.9  Hematocrit 36.0 - 46.0 % 47.3(H) 45.8 43.4  Platelets 150 - 400 K/uL 261 257 196   Lipid Panel     Component Value Date/Time   CHOL 170 07/22/2009 0127   TRIG 129 07/22/2009 0127   HDL 48 07/22/2009 0127   CHOLHDL 3.5 Ratio 07/22/2009 0127   VLDL 26 07/22/2009 0127   LDLCALC 96 07/22/2009 0127   BNP    Component Value Date/Time   BNP 73.2 10/01/2019 1059   BNP 74.5 09/23/2019 1328    HEMOGLOBIN A1C Lab Results  Component Value Date   HGBA1C 6.3 (H) 09/02/2012   MPG 134 (H) 09/02/2012    TSH No results for input(s): TSH in the last 8760 hours. Medications and allergies   Allergies  Allergen Reactions  . Meperidine Rash and Other (See Comments)    Reaction:  Bottoms out BP Other reaction(s): other/intolerance Hypotension   . Atorvastatin Other (See Comments)    Reaction:  Muscle pain   . Febuxostat Other (See Comments)    Reaction:  Visual changes  Other reaction(s): neurological reaction Visual changes Reaction:Visual changes   . Simvastatin     Other reaction(s): other/intolerance Elevated liver enzymes   . Statins     Other reaction(s): muscle/joint pain  . Zocor [Simvastatin - High Dose] Other (See Comments)    Reaction:  Elevated liver enzymes     Current Outpatient Medications  Medication Instructions  . allopurinol (ZYLOPRIM) 100 MG tablet 1 tablet, Oral, Daily  . ELIQUIS 5 MG TABS tablet TAKE 1 TABLET TWICE A DAY  . Ergocalciferol (VITAMIN D2) 2000 units TABS 1 tablet, Oral, Daily  . esomeprazole (NEXIUM) 40 mg, Oral, Every evening  . fluticasone (FLONASE) 50 MCG/ACT nasal spray Nasal, As needed  . gabapentin (NEURONTIN) 300 mg, Oral, Daily  . HYDROcodone-acetaminophen (NORCO/VICODIN) 5-325 MG tablet 1 tablet, Oral, Every 8 hours PRN  . levothyroxine (SYNTHROID) 50 mcg, Oral, Daily before breakfast  . LORazepam (ATIVAN) 1.5 mg, Oral, 3 times daily  . metoprolol succinate (TOPROL-XL) 100 MG 24 hr tablet TAKE 1 TABLET DAILY. TAKE WITH OR IMMEDIATELY FOLLOWING A MEAL.  . miconazole (MICOTIN) 2 % cream 1 application, Topical, As needed  . nystatin-triamcinolone ointment (MYCOLOG) 1 application, Topical, As needed  . Polyethyl Glycol-Propyl Glycol (SYSTANE OP) 2 drops, Ophthalmic, As needed, In both eyes   . tiotropium (SPIRIVA) 18 mcg, Inhalation, Daily  . torsemide (DEMADEX) 20 mg, Oral, Daily PRN    Radiology:  No results found.  Cardiac Studies:  Echocardiogram 10/09/2019: Left ventricle cavity is normal in size. Moderate concentric  hypertrophy of the left ventricle. Mild global hypokinesis. EF 45-50%. Unable to evaluate diastolic function due to atrial fibrillation.  Left atrial cavity is severely dilated. Moderate (Grade III) mitral regurgitation. Mild to moderate tricuspid regurgitation. Estimated pulmonary artery systolic pressure is 28 mmHg.   Compared to previous study on 09/20/2018, EF is reduced from 50-55% to 45-50%.  Lexiscan myoview stress test 03/29/2017: Two day protocol followed due to patient's bodily habitus. 1. The resting electrocardiogram demonstrated atrial fibrillation and normal rest repolarization, low voltage complexes. Stress EKG is non-diagnostic for ischemia as it a pharmacologic stress using Lexiscan. 2. REST and STRESS images demonstrate decreased tracer uptake in the basal inferoseptal, basal inferior, mid inferoseptal and apical segments of the left ventricle. These defects are related to breast attenuation. Mild ischemia in this region cannot be completely excluded. Gated SPECT images reveal normal myocardial thickening and wall motion. The left ventricular ejection fraction was calculated or visually estimated to be 60%. This is a low risk study, clinical correlation recommended in view of patient's bodily habitus.  EKG   EKG 11/16/2020: Atrial fibrillation with controlled ventricular response at a rate of 75 bpm.  Left axis.  Poor R wave progression, cannot exclude anteroseptal infarct old.  Nonspecific T wave abnormality.  Low voltage complexes, pulmonary disease pattern.  Compared to EKG 05/13/2020, no significant change.  Assessment     ICD-10-CM   1. Permanent atrial fibrillation (HCC)  I48.21 EKG 12-Lead    ECHOCARDIOGRAM COMPLETE  2. Chronic diastolic (congestive) heart failure (HCC)  I50.32 torsemide (DEMADEX) 20 MG tablet    ECHOCARDIOGRAM COMPLETE  3. Class 3 severe obesity due to excess calories with serious comorbidity and body mass index (BMI) of 50.0 to 59.9 in adult ([place])   E66.01    Z68.43     CHA2DS2-VASc Score is 4.  Yearly risk of stroke: 4% (A, F, HTN, CHF).   Meds ordered this encounter  Medications  . torsemide (DEMADEX) 20 MG tablet    Sig: Take 1 tablet (20 mg total) by mouth daily as needed (For shortness of breath, weight gain, or leg swelling).    Dispense:  60 tablet    Refill:  1    Discontinue Furosemide   Recommendations:   [name]  is a 79 y.o. Caucasian female with permanent atrial fibrillation, morbid obesity, depression, chronic diastolic CHF, hypertension, frequency and urgency of urination that is chronic, very sensitive to medications.  Patient presents for 6 month follow up. She is stable from a cardiovascular standpoint without complaints and without clinical signs of heart failure. Her primary issue today is worsening hip and back pain that are limiting her mobility. Encouraged her to continue to follow with orthopedics and spinal surgery. She remains in atrial fibrillation well rate controlled, tolerating anticoagulation without bleeding diathesis. Will continue Eliquis and metoprolol. Blood pressure is well controlled. Patient has not needed it since last visit, but advised her to continue as needed torsemide for volume overload. Will repeat echocardiogram at this time in view of chronic diastolic heart failure.   Will repeat lipid profile testing at next visit if not done by PCP.   Follow up in 6 months, sooner if needed, for atrial fibrillation, heart failure, and hypertension.   Patient was seen in collaboration with Dr. Einar Gip and he is in agreement of the plan.    Alethia Berthold, PA-C 11/16/2020, 3:45 PM Office: [phone]

## 2020-12-08 ENCOUNTER — Other Ambulatory Visit: Payer: Self-pay | Admitting: Cardiology

## 2020-12-15 DIAGNOSIS — M545 Low back pain, unspecified: Secondary | ICD-10-CM | POA: Diagnosis not present

## 2020-12-15 DIAGNOSIS — Z5181 Encounter for therapeutic drug level monitoring: Secondary | ICD-10-CM | POA: Diagnosis not present

## 2020-12-15 DIAGNOSIS — M25551 Pain in right hip: Secondary | ICD-10-CM | POA: Diagnosis not present

## 2020-12-15 DIAGNOSIS — G8929 Other chronic pain: Secondary | ICD-10-CM | POA: Diagnosis not present

## 2020-12-15 DIAGNOSIS — M5136 Other intervertebral disc degeneration, lumbar region: Secondary | ICD-10-CM | POA: Diagnosis not present

## 2020-12-15 DIAGNOSIS — M47816 Spondylosis without myelopathy or radiculopathy, lumbar region: Secondary | ICD-10-CM | POA: Diagnosis not present

## 2020-12-15 DIAGNOSIS — Z79891 Long term (current) use of opiate analgesic: Secondary | ICD-10-CM | POA: Diagnosis not present

## 2020-12-15 DIAGNOSIS — R29898 Other symptoms and signs involving the musculoskeletal system: Secondary | ICD-10-CM | POA: Diagnosis not present

## 2020-12-16 ENCOUNTER — Other Ambulatory Visit (HOSPITAL_COMMUNITY): Payer: Medicare Other

## 2021-01-10 ENCOUNTER — Other Ambulatory Visit: Payer: Self-pay

## 2021-01-10 ENCOUNTER — Ambulatory Visit (HOSPITAL_COMMUNITY): Payer: Medicare Other | Attending: Cardiovascular Disease

## 2021-01-10 DIAGNOSIS — I4821 Permanent atrial fibrillation: Secondary | ICD-10-CM | POA: Diagnosis not present

## 2021-01-10 DIAGNOSIS — I5032 Chronic diastolic (congestive) heart failure: Secondary | ICD-10-CM | POA: Insufficient documentation

## 2021-01-10 LAB — ECHOCARDIOGRAM COMPLETE
Area-P 1/2: 3.13 cm2
S' Lateral: 3 cm

## 2021-01-11 NOTE — Progress Notes (Signed)
Please inform patient, LVEF has increased to 60-65% and upper chambers remain dilated. Will discuss further at next office vist.

## 2021-01-12 NOTE — Progress Notes (Signed)
Called and spoke with patient. She would like a call from you to explain in detail her echocardiogram result.

## 2021-01-23 ENCOUNTER — Other Ambulatory Visit: Payer: Self-pay | Admitting: Family Medicine

## 2021-01-23 DIAGNOSIS — Z1231 Encounter for screening mammogram for malignant neoplasm of breast: Secondary | ICD-10-CM

## 2021-01-24 DIAGNOSIS — I4821 Permanent atrial fibrillation: Secondary | ICD-10-CM | POA: Diagnosis not present

## 2021-01-24 DIAGNOSIS — E079 Disorder of thyroid, unspecified: Secondary | ICD-10-CM | POA: Diagnosis not present

## 2021-01-24 DIAGNOSIS — Z6841 Body Mass Index (BMI) 40.0 and over, adult: Secondary | ICD-10-CM | POA: Diagnosis not present

## 2021-01-24 DIAGNOSIS — I5033 Acute on chronic diastolic (congestive) heart failure: Secondary | ICD-10-CM | POA: Diagnosis not present

## 2021-01-24 DIAGNOSIS — E8770 Fluid overload, unspecified: Secondary | ICD-10-CM | POA: Diagnosis not present

## 2021-01-24 DIAGNOSIS — Z20822 Contact with and (suspected) exposure to covid-19: Secondary | ICD-10-CM | POA: Diagnosis not present

## 2021-01-24 DIAGNOSIS — K219 Gastro-esophageal reflux disease without esophagitis: Secondary | ICD-10-CM | POA: Diagnosis not present

## 2021-01-24 DIAGNOSIS — R0602 Shortness of breath: Secondary | ICD-10-CM | POA: Diagnosis not present

## 2021-01-24 DIAGNOSIS — R918 Other nonspecific abnormal finding of lung field: Secondary | ICD-10-CM | POA: Diagnosis not present

## 2021-01-24 DIAGNOSIS — I509 Heart failure, unspecified: Secondary | ICD-10-CM | POA: Diagnosis not present

## 2021-01-24 DIAGNOSIS — R0902 Hypoxemia: Secondary | ICD-10-CM | POA: Diagnosis not present

## 2021-01-25 DIAGNOSIS — I4821 Permanent atrial fibrillation: Secondary | ICD-10-CM | POA: Diagnosis present

## 2021-01-25 DIAGNOSIS — E079 Disorder of thyroid, unspecified: Secondary | ICD-10-CM | POA: Diagnosis present

## 2021-01-25 DIAGNOSIS — I5033 Acute on chronic diastolic (congestive) heart failure: Secondary | ICD-10-CM | POA: Diagnosis present

## 2021-01-25 DIAGNOSIS — Z7983 Long term (current) use of bisphosphonates: Secondary | ICD-10-CM | POA: Diagnosis not present

## 2021-01-25 DIAGNOSIS — Z9989 Dependence on other enabling machines and devices: Secondary | ICD-10-CM | POA: Diagnosis not present

## 2021-01-25 DIAGNOSIS — Z79890 Hormone replacement therapy (postmenopausal): Secondary | ICD-10-CM | POA: Diagnosis not present

## 2021-01-25 DIAGNOSIS — K760 Fatty (change of) liver, not elsewhere classified: Secondary | ICD-10-CM | POA: Diagnosis present

## 2021-01-25 DIAGNOSIS — G47419 Narcolepsy without cataplexy: Secondary | ICD-10-CM | POA: Diagnosis present

## 2021-01-25 DIAGNOSIS — Z7952 Long term (current) use of systemic steroids: Secondary | ICD-10-CM | POA: Diagnosis not present

## 2021-01-25 DIAGNOSIS — Z7984 Long term (current) use of oral hypoglycemic drugs: Secondary | ICD-10-CM | POA: Diagnosis not present

## 2021-01-25 DIAGNOSIS — R5381 Other malaise: Secondary | ICD-10-CM | POA: Diagnosis not present

## 2021-01-25 DIAGNOSIS — Z9049 Acquired absence of other specified parts of digestive tract: Secondary | ICD-10-CM | POA: Diagnosis not present

## 2021-01-25 DIAGNOSIS — Z79891 Long term (current) use of opiate analgesic: Secondary | ICD-10-CM | POA: Diagnosis not present

## 2021-01-25 DIAGNOSIS — G4733 Obstructive sleep apnea (adult) (pediatric): Secondary | ICD-10-CM | POA: Diagnosis present

## 2021-01-25 DIAGNOSIS — I4811 Longstanding persistent atrial fibrillation: Secondary | ICD-10-CM | POA: Diagnosis not present

## 2021-01-25 DIAGNOSIS — Z90710 Acquired absence of both cervix and uterus: Secondary | ICD-10-CM | POA: Diagnosis not present

## 2021-01-25 DIAGNOSIS — K219 Gastro-esophageal reflux disease without esophagitis: Secondary | ICD-10-CM | POA: Diagnosis present

## 2021-01-25 DIAGNOSIS — Z8719 Personal history of other diseases of the digestive system: Secondary | ICD-10-CM | POA: Diagnosis not present

## 2021-01-25 DIAGNOSIS — Z20822 Contact with and (suspected) exposure to covid-19: Secondary | ICD-10-CM | POA: Diagnosis present

## 2021-01-25 DIAGNOSIS — Z6841 Body Mass Index (BMI) 40.0 and over, adult: Secondary | ICD-10-CM | POA: Diagnosis not present

## 2021-01-25 DIAGNOSIS — Z7901 Long term (current) use of anticoagulants: Secondary | ICD-10-CM | POA: Diagnosis not present

## 2021-01-25 DIAGNOSIS — Z79899 Other long term (current) drug therapy: Secondary | ICD-10-CM | POA: Diagnosis not present

## 2021-01-25 DIAGNOSIS — M109 Gout, unspecified: Secondary | ICD-10-CM | POA: Diagnosis present

## 2021-01-25 DIAGNOSIS — E8770 Fluid overload, unspecified: Secondary | ICD-10-CM | POA: Diagnosis not present

## 2021-01-25 DIAGNOSIS — Z87442 Personal history of urinary calculi: Secondary | ICD-10-CM | POA: Diagnosis not present

## 2021-01-25 DIAGNOSIS — Z7951 Long term (current) use of inhaled steroids: Secondary | ICD-10-CM | POA: Diagnosis not present

## 2021-02-01 NOTE — Progress Notes (Signed)
Primary Physician/Referring:  Denny Levy, PA  Patient ID: [name], female    DOB: November 07, 1941, 80 y.o.   MRN: [id number]  Chief Complaint  Patient presents with  . Atrial Fibrillation  . Hospitalization Follow-up   HPI:    [name]  is a 80 y.o. Caucasian female with permanent atrial fibrillation, morbid obesity, depression, chronic diastolic CHF, hypertension, frequency and urgency of urination that is chronic, very sensitive to medications.  Patient presents for follow-up after hospitalization at [facility] 01/24/2021-01/26/2021 for hypoxia with acute on chronic diastolic heart failure.  Patient diuresed well with Lasix and was discharged home.  Since discharge patient symptoms have significantly improved.  She continues to have dyspnea on exertion, and has returned to baseline per patient, she does have chronic stable dyspnea on exertion.  She is presently in a wheelchair, although she is able to ambulate in her home with a walker.  She denies chest pain, palpitations, dizziness, orthopnea, PND.  Patient denies syncope, near syncope. She has been taking furosemide 20 mg daily since discharge.  Her primary complaints today are chronic congestion and abdominal bloating with associated constipation.  Past Medical History:  Diagnosis Date  . A-fib ([employer])   . Allergic rhinitis   . Anxiety   . Asthma    pt states she "believe it is more allergies"  . Cellulitis and abscess of left leg 01/2020  . CHF (congestive heart failure) ([place])   . Cholecystitis   . Chronic abdominal pain   . Colon polyp    TCS 2005 TICS [place], TCS/PROPOFOL 2011 SIMPLE [place]  . Depression   . Diverticulosis 2005  . Fatty liver   . Fibromyalgia   . GERD (gastroesophageal reflux disease)   . Gout   . Hyperlipemia   . Hypothyroidism   . Internal hemorrhoids    H/o Rectal bleeding secondary to internal hemorrhoids 04/2010.  Marland Kitchen MCI (mild cognitive impairment) 12/30/2017  . Migraine   .  Narcolepsy   . Obesity, morbid (more than 100 lbs over ideal weight or BMI > 40) (HCC)   . Obstructive sleep apnea on CPAP and O2  . Osteoarthritis   . Persistent atrial fibrillation ([place])    a. s/p DCCV 01/2012  . Volume overload    uses torsemide PRN   Past Surgical History:  Procedure Laterality Date  . ABDOMINAL HYSTERECTOMY  FIBROIDS 1982  . BLADDER SUSPENSION  1982  . CARDIOVERSION  02/05/2012   Procedure: CARDIOVERSION;  Surgeon: Laverda Page, MD;  Location: [place];  Service: Cardiovascular;  Laterality: N/A;  . CARDIOVERSION N/A 06/09/2013   Procedure: CARDIOVERSION;  Surgeon: Laverda Page, MD;  Location: [facility] ENDOSCOPY;  Service: Cardiovascular;  Laterality: N/A;  h&p in file-[place]  . CARDIOVERSION N/A 08/09/2015   Procedure: CARDIOVERSION;  Surgeon: Adrian Prows, MD;  Location: [facility] ENDOSCOPY;  Service: Cardiovascular;  Laterality: N/A;  . CARDIOVERSION N/A 09/13/2015   Procedure: CARDIOVERSION;  Surgeon: Adrian Prows, MD;  Location: [facility] ENDOSCOPY;  Service: Cardiovascular;  Laterality: N/A;  . CHOLECYSTECTOMY  09/03/2012   Procedure: LAPAROSCOPIC CHOLECYSTECTOMY;  Surgeon: Jamesetta So, MD;  Location: [facility] ORS;  Service: General;  Laterality: N/A;  . COLONOSCOPY  2005   Dr. Tamala Julian: sigmoid diverticulosis  . COLONOSCOPY  June 2011   Dr. Oneida Alar: internal hemorrhoids, simple adenomas, hyperplastic polyps, needs surveillance in June 2014 with overtube and Propofol  . COLONOSCOPY WITH PROPOFOL N/A 03/19/2016   Dr. Gala Romney: multiple small tubular adenomas, diverticulosis. Surveillance  in 5 years   . ERCP  10/01/2012   Procedure: ENDOSCOPIC RETROGRADE CHOLANGIOPANCREATOGRAPHY (ERCP);  Surgeon: Daneil Dolin, MD;  Location: [facility] ORS;  Service: Endoscopy;  Laterality: N/A;  . ESOPHAGOGASTRODUODENOSCOPY (EGD) WITH PROPOFOL N/A 03/19/2016   DR. Rourk: normal esohpagus with small hiatal hernia   . Kidney Stent  1995  . POLYPECTOMY  03/19/2016   Procedure: POLYPECTOMY;  Surgeon: Daneil Dolin, MD;   Location: [facility] END[facility] SU[facility];  Service: Endoscopy;;  cold snares  . REMOVAL OF STONES  10/01/2012   Procedure: REMOVAL OF STONES;  Surgeon: Daneil Dolin, MD;  Location: [facility] OR[facility];  Service: Endoscopy;  Laterality: N/A;  . RHINOPLASTY    . SPHINCTEROTOMY  10/01/2012   Procedure: SPHINCTEROTOMY;  Surgeon: Daneil Dolin, MD;  Location: [facility] OR[facility];  Service: Endoscopy;  Laterality: N/A;   Social History   Tobacco Use  . Smoking status: Former Smoker    Packs/day: 0.50    Years: 30.00    Pack years: 15.00    Quit date: 10/03/1989    Years since quitting: 31.3  . Smokeless tobacco: Never Used  . Tobacco comment: Quit in 1992  Substance Use Topics  . Alcohol use: No    Alcohol/week: 0.0 standard drinks    Marital Status: Widowed   ROS  Review of Systems  Constitutional: Negative for chills, decreased appetite, malaise/fatigue and weight gain.  Cardiovascular: Positive for dyspnea on exertion (stable , at baseline). Negative for chest pain, claudication, leg swelling, near-syncope, orthopnea, palpitations, paroxysmal nocturnal dyspnea and syncope.  Respiratory: Positive for snoring (on CPAP). Negative for shortness of breath.   Endocrine: Negative for cold intolerance.  Hematologic/Lymphatic: Does not bruise/bleed easily.  Musculoskeletal: Positive for arthritis and back pain. Negative for joint swelling.  Gastrointestinal: Negative for abdominal pain, anorexia, diarrhea, hematochezia and melena.  Genitourinary: Positive for bladder incontinence and frequency (chronic).  Neurological: Negative for dizziness, headaches, light-headedness and weakness.  Psychiatric/Behavioral: Negative for depression and substance abuse.  All other systems reviewed and are negative.  Objective   Vitals with BMI 02/02/2021 11/16/2020 05/13/2020  Height 5\' 2"  5\' 2"  5\' 2"   Weight 291 lbs 299 lbs 312 lbs 10 oz  BMI 53.21 67.12 45.80  Systolic 998 338 250  Diastolic 59 76 63  Pulse 71 75 67     Physical  Exam Constitutional:      Comments: Moderately built and Morbidly obese in no acute distress.  Neck:     Thyroid: No thyromegaly.     Comments: Short neck and difficult to evaluate JVP Cardiovascular:     Rate and Rhythm: Normal rate. Rhythm irregular.     Pulses:          Carotid pulses are 2+ on the right side and 2+ on the left side.      Dorsalis pedis pulses are 2+ on the right side and 2+ on the left side.       Posterior tibial pulses are 2+ on the right side and 2+ on the left side.     Heart sounds: No murmur heard. No gallop.      Comments: S1 variable and S2 normal. Distant HS.  Femoral and popliteal pulse difficult to feel due to patient's body habitus.  No leg edema. Large adipose tissue noted. JVD difficult to make out due to short neck Pulmonary:     Effort: Pulmonary effort is normal.     Breath sounds: Normal breath sounds. No wheezing, rhonchi or rales.  Abdominal:  General: Bowel sounds are normal. There is no distension.     Palpations: Abdomen is soft.     Comments: Obese. Pannus present  Musculoskeletal:     Right lower leg: No edema.     Left lower leg: No edema.  Skin:    General: Skin is warm and dry.  Neurological:     General: No focal deficit present.     Mental Status: She is oriented to person, place, and time.    Laboratory examination:    CMP Latest Ref Rng & Units 10/01/2019 09/23/2019 09/18/2019  Glucose 65 - 99 mg/dL 112(H) 109(H) 118(H)  BUN 8 - 27 mg/dL 19 19 16   Creatinine 0.57 - 1.00 mg/dL 1.06(H) 1.00 0.83  Sodium 134 - 144 mmol/L 139 140 142  Potassium 3.5 - 5.2 mmol/L 4.1 3.8 4.3  Chloride 96 - 106 mmol/L 100 99 103  CO2 20 - 29 mmol/L 27 31 28   Calcium 8.7 - 10.3 mg/dL 9.6 9.9 9.0  Total Protein 6.5 - 8.1 g/dL - 7.4 -  Total Bilirubin 0.3 - 1.2 mg/dL - 0.5 -  Alkaline Phos 38 - 126 U/L - 81 -  AST 15 - 41 U/L - 28 -  ALT 0 - 44 U/L - 30 -   CBC Latest Ref Rng & Units 09/23/2019 12/29/2018 02/16/2018  WBC 4.0 - 10.5 K/uL  7.7 7.0 6.6  Hemoglobin 12.0 - 15.0 g/dL 14.8 14.6 13.9  Hematocrit 36.0 - 46.0 % 47.3(H) 45.8 43.4  Platelets 150 - 400 K/uL 261 257 196   Lipid Panel     Component Value Date/Time   CHOL 170 07/22/2009 0127   TRIG 129 07/22/2009 0127   HDL 48 07/22/2009 0127   CHOLHDL 3.5 Ratio 07/22/2009 0127   VLDL 26 07/22/2009 0127   LDLCALC 96 07/22/2009 0127   BNP    Component Value Date/Time   BNP 73.2 10/01/2019 1059   BNP 74.5 09/23/2019 1328    HEMOGLOBIN A1C Lab Results  Component Value Date   HGBA1C 6.3 (H) 09/02/2012   MPG 134 (H) 09/02/2012   TSH No results for input(s): TSH in the last 8760 hours.   External labs: 01/26/2021: Sodium 141, potassium 3.8, glucose 108, BUN 17, creatinine 0.95, GFR 61 Hemoglobin 14.2, hematocrit 44.6, MCV 90, platelet 223 NT proBNP 891  Medications and allergies   Allergies  Allergen Reactions  . Meperidine Rash and Other (See Comments)    Reaction:  Bottoms out BP Other reaction(s): other/intolerance Hypotension   . Atorvastatin Other (See Comments)    Reaction:  Muscle pain   . Febuxostat Other (See Comments)    Reaction:  Visual changes  Other reaction(s): neurological reaction Visual changes Reaction:Visual changes   . Simvastatin     Other reaction(s): other/intolerance Elevated liver enzymes   . Statins     Other reaction(s): muscle/joint pain  . Zocor [Simvastatin - High Dose] Other (See Comments)    Reaction:  Elevated liver enzymes     Current Outpatient Medications  Medication Instructions  . Cholecalciferol (D3) 50 MCG (2000 UT) TABS Oral  . ELIQUIS 5 MG TABS tablet TAKE 1 TABLET TWICE A DAY  . Ergocalciferol (VITAMIN D2) 2000 units TABS 1 tablet, Oral, Daily  . esomeprazole (NEXIUM) 40 mg, Oral, Every evening  . fluticasone (FLONASE) 50 MCG/ACT nasal spray Nasal, As needed  . furosemide (LASIX) 20 mg, Oral, Daily  . HYDROcodone-acetaminophen (NORCO/VICODIN) 5-325 MG tablet No dose, route, or frequency  recorded.  Marland Kitchen levothyroxine (  SYNTHROID) 50 mcg, Oral, Daily before breakfast  . LORazepam (ATIVAN) 1.5 mg, Oral, 3 times daily  . metoprolol succinate (TOPROL-XL) 100 MG 24 hr tablet TAKE 1 TABLET DAILY. TAKE WITH OR IMMEDIATELY FOLLOWING A MEAL.  . miconazole (MICOTIN) 2 % cream 1 application, Topical, As needed  . nystatin-triamcinolone ointment (MYCOLOG) 1 application, Topical, As needed  . Polyethyl Glycol-Propyl Glycol (SYSTANE OP) 2 drops, Ophthalmic, As needed, In both eyes   . tiotropium (SPIRIVA) 18 mcg, Inhalation, Daily    Radiology:  No results found.  Cardiac Studies:  CT angio pulmonary 01/24/2021: No acute pulmonary findings, negative for PE  Chest x-ray 01/24/2021: Bibasilar opacities may represent atelectasis or infiltrates  Echocardiogram 10/09/2019: Left ventricle cavity is normal in size. Moderate concentric hypertrophy of the left ventricle. Mild global hypokinesis. EF 45-50%. Unable to evaluate diastolic function due to atrial fibrillation.  Left atrial cavity is severely dilated. Moderate (Grade III) mitral regurgitation. Mild to moderate tricuspid regurgitation. Estimated pulmonary artery systolic pressure is 28 mmHg.   Compared to previous study on 09/20/2018, EF is reduced from 50-55% to 45-50%.  Lexiscan myoview stress test 03/29/2017: Two day protocol followed due to patient's bodily habitus. 1. The resting electrocardiogram demonstrated atrial fibrillation and normal rest repolarization, low voltage complexes. Stress EKG is non-diagnostic for ischemia as it a pharmacologic stress using Lexiscan. 2. REST and STRESS images demonstrate decreased tracer uptake in the basal inferoseptal, basal inferior, mid inferoseptal and apical segments of the left ventricle. These defects are related to breast attenuation. Mild ischemia in this region cannot be completely excluded. Gated SPECT images reveal normal myocardial thickening and wall motion. The left ventricular  ejection fraction was calculated or visually estimated to be 60%. This is a low risk study, clinical correlation recommended in view of patient's bodily habitus.  EKG   EKG 02/02/2021: Atrial fibrillation with controlled ventricular response at a rate of 77 bpm.  Left axis.  Poor R wave progression, cannot exclude anteroseptal infarct old.  Nonspecific T wave abnormality.  Low voltage complexes, consider pulmonary disease pattern.  Compared to EKG 11/16/2020, no significant change.  EKG 01/24/2021 (external): Atrial fibrillation with controlled ventricular response at a rate of 73 bpm.  Low voltage complexes.  Assessment     ICD-10-CM   1. Chronic diastolic (congestive) heart failure (HCC)  I50.32   2. Permanent atrial fibrillation (HCC)  I48.21 EKG 12-Lead  3. Class 3 severe obesity due to excess calories with serious comorbidity and body mass index (BMI) of 50.0 to 59.9 in adult (HCC)  E66.01    Z68.43     CHA2DS2-VASc Score is 4.  Yearly risk of stroke: 4% (A, F, HTN, CHF).   No orders of the defined types were placed in this encounter.  Medications Discontinued During This Encounter  Medication Reason  . allopurinol (ZYLOPRIM) 100 MG tablet Completed Course  . gabapentin (NEURONTIN) 300 MG capsule Error  . torsemide (DEMADEX) 20 MG tablet Change in therapy    Recommendations:   [name]  is a 80 y.o. Caucasian female with permanent atrial fibrillation, morbid obesity, depression, chronic diastolic CHF, hypertension, frequency and urgency of urination that is chronic, very sensitive to medications.  Patient presents for follow-up after hospitalization at [facility] 01/24/2021-01/26/2021 for hypoxia with acute on chronic diastolic heart failure.  Patient diuresed well with Lasix and was discharged home.  Since discharge patient symptoms have significantly improved.  She continues to take furosemide 20 mg daily.  There are no  clinical signs of heart failure at this time.   Blood pressure is presently soft, but she remains asymptomatic.  Remain patient continue present medications including furosemide 20 mg daily, with additional doses as needed for volume overload.  Discussed at length regarding signs and symptoms that would warrant additional Lasix dose and/or contacting our office, patient and her daughter who was present at bedside verbalized understanding and agreement.  In regard to atrial fibrillation, patient's CHA2DS2-VASc score is 4. She is presently tolerating Eliquis without bleeding diathesis, will continue this.  She remains well rate controlled, will continue metoprolol succinate 100 mg daily.  Advised patient to follow-up with PCP regarding abdominal bloating and congestion as I do not suspect underlying cardiovascular etiology of the symptoms, she verbalized understanding.  Patient has appointment scheduled with her PCP next week, although she is unsure if PCP will be doing blood work.  Advised patient to notify our office if her primary does not check lipid profile testing, we will order it.  Follow-up in 3 months, sooner if needed, for chronic diastolic heart failure and permanent atrial fibrillation.   Jennifer Berthold, PA-C 02/02/2021, 3:13 PM Office: [phone]

## 2021-02-02 ENCOUNTER — Encounter: Payer: Self-pay | Admitting: Student

## 2021-02-02 ENCOUNTER — Encounter: Payer: Self-pay | Admitting: Internal Medicine

## 2021-02-02 ENCOUNTER — Ambulatory Visit: Payer: Medicare Other | Admitting: Student

## 2021-02-02 ENCOUNTER — Other Ambulatory Visit: Payer: Self-pay

## 2021-02-02 VITALS — BP 112/59 | HR 71 | Ht 62.0 in | Wt 291.0 lb

## 2021-02-02 DIAGNOSIS — I4821 Permanent atrial fibrillation: Secondary | ICD-10-CM

## 2021-02-02 DIAGNOSIS — Z6841 Body Mass Index (BMI) 40.0 and over, adult: Secondary | ICD-10-CM | POA: Diagnosis not present

## 2021-02-02 DIAGNOSIS — I5032 Chronic diastolic (congestive) heart failure: Secondary | ICD-10-CM

## 2021-02-07 DIAGNOSIS — I509 Heart failure, unspecified: Secondary | ICD-10-CM | POA: Diagnosis not present

## 2021-02-07 DIAGNOSIS — I10 Essential (primary) hypertension: Secondary | ICD-10-CM | POA: Diagnosis not present

## 2021-02-07 DIAGNOSIS — I4891 Unspecified atrial fibrillation: Secondary | ICD-10-CM | POA: Diagnosis not present

## 2021-02-07 DIAGNOSIS — E039 Hypothyroidism, unspecified: Secondary | ICD-10-CM | POA: Diagnosis not present

## 2021-03-11 DIAGNOSIS — H2513 Age-related nuclear cataract, bilateral: Secondary | ICD-10-CM | POA: Diagnosis not present

## 2021-03-11 DIAGNOSIS — H524 Presbyopia: Secondary | ICD-10-CM | POA: Diagnosis not present

## 2021-03-11 DIAGNOSIS — H33321 Round hole, right eye: Secondary | ICD-10-CM | POA: Diagnosis not present

## 2021-03-15 ENCOUNTER — Ambulatory Visit: Payer: Medicare Other

## 2021-03-20 ENCOUNTER — Telehealth: Payer: Self-pay

## 2021-03-20 DIAGNOSIS — R0603 Acute respiratory distress: Secondary | ICD-10-CM | POA: Diagnosis not present

## 2021-03-20 DIAGNOSIS — E079 Disorder of thyroid, unspecified: Secondary | ICD-10-CM | POA: Diagnosis not present

## 2021-03-20 DIAGNOSIS — R9431 Abnormal electrocardiogram [ECG] [EKG]: Secondary | ICD-10-CM | POA: Diagnosis not present

## 2021-03-20 DIAGNOSIS — Z7901 Long term (current) use of anticoagulants: Secondary | ICD-10-CM | POA: Diagnosis not present

## 2021-03-20 DIAGNOSIS — Z20822 Contact with and (suspected) exposure to covid-19: Secondary | ICD-10-CM | POA: Diagnosis not present

## 2021-03-20 DIAGNOSIS — I509 Heart failure, unspecified: Secondary | ICD-10-CM | POA: Diagnosis not present

## 2021-03-20 DIAGNOSIS — J439 Emphysema, unspecified: Secondary | ICD-10-CM | POA: Diagnosis not present

## 2021-03-20 DIAGNOSIS — R0602 Shortness of breath: Secondary | ICD-10-CM | POA: Diagnosis not present

## 2021-03-20 DIAGNOSIS — Z87891 Personal history of nicotine dependence: Secondary | ICD-10-CM | POA: Diagnosis not present

## 2021-03-20 DIAGNOSIS — Z79899 Other long term (current) drug therapy: Secondary | ICD-10-CM | POA: Diagnosis not present

## 2021-03-20 DIAGNOSIS — E7800 Pure hypercholesterolemia, unspecified: Secondary | ICD-10-CM | POA: Diagnosis not present

## 2021-03-20 DIAGNOSIS — M1991 Primary osteoarthritis, unspecified site: Secondary | ICD-10-CM | POA: Diagnosis not present

## 2021-03-20 DIAGNOSIS — Z7951 Long term (current) use of inhaled steroids: Secondary | ICD-10-CM | POA: Diagnosis not present

## 2021-03-20 DIAGNOSIS — J189 Pneumonia, unspecified organism: Secondary | ICD-10-CM | POA: Diagnosis not present

## 2021-03-20 DIAGNOSIS — K219 Gastro-esophageal reflux disease without esophagitis: Secondary | ICD-10-CM | POA: Diagnosis not present

## 2021-03-20 NOTE — Telephone Encounter (Signed)
Spoke with granddaughter Mable Fill. She will advise patient to increase her Lasix 20 mg daily to 20 mg twice daily for 4 days until weight back to baseline and will make sure she watches her diet and calorie intake.

## 2021-03-20 NOTE — Telephone Encounter (Signed)
Patient granddaughter [name] called with concerns that her grandmother (patient), has been experiencing SOB and gained 4lbs since yesterday. Please advise.    Apryle Stowell  [phone]

## 2021-03-21 ENCOUNTER — Telehealth: Payer: Self-pay

## 2021-03-21 NOTE — Telephone Encounter (Signed)
Let's see what the hospital says. If discharged, 40 mg Lasix in morning and 40 mg in later afternoon around 4 pm for the next 1 week. Set up OV later to see [place]. JG

## 2021-03-21 NOTE — Telephone Encounter (Signed)
Patient's daughter called that her mother ended up going to the ER and when the patient was in the hospital they administrated Lasix. She just wanted to make you aware that yesterday she spoke to another MA in the office and you instructed she increase lasix to 4 times daily and she is wonder if she needs to continue that please advise

## 2021-03-22 ENCOUNTER — Other Ambulatory Visit: Payer: Self-pay

## 2021-03-22 MED ORDER — FUROSEMIDE 40 MG PO TABS: 40.0000 mg | ORAL_TABLET | Freq: Every day | ORAL | 0 refills | Status: DC

## 2021-03-22 NOTE — Telephone Encounter (Signed)
Spoke to patient daughter and patient is scheduled sooner to see CC

## 2021-03-27 NOTE — Progress Notes (Signed)
Primary Physician/Referring:  Denny Levy, PA  Patient ID: [name], female    DOB: Mar 03, 1941, 80 y.o.   MRN: [id number]  Chief Complaint  Patient presents with  . Atrial Fibrillation  . Chronic diastolic (congestive) heart failure   . Follow-up    3 month  . Shortness of Breath   HPI:    [name]  is a 80 y.o. Caucasian female with permanent atrial fibrillation, morbid obesity, depression, chronic diastolic CHF, hypertension, frequency and urgency of urination that is chronic, very sensitive to medications.  Patient has had multiple hospitalizations for acute on chronic diastolic heart failure in March/2022, and most recently again 03/20/2021.  Patient presented to emergency department at [facility] with shortness of breath and volume overload, at that time work-up was unremarkable and patient was therefore diuresed with 40 mg Lasix and discharged home.  Patient now presents for follow-up of chronic diastolic heart failure, hypertension, permanent atrial fibrillation.  Patient has chronic dyspnea on exertion at baseline, however she reports over the last few weeks she has noticed worsening dyspnea even with minimal activity.  She is also concerned regarding severe chronic back pain, which continues to limit her activity.  She had previously been advised to increase Lasix to 40 mg twice daily, however she has issues with urinary incontinence and therefore has only been taking Lasix 40 mg once daily.  Patient denies chest pain, palpitations, dizziness, orthopnea, PND.  Denies syncope, near syncope, leg swelling.  Past Medical History:  Diagnosis Date  . A-fib ([place])   . Allergic rhinitis   . Anxiety   . Asthma    pt states she "believe it is more allergies"  . Cellulitis and abscess of left leg 01/2020  . CHF (congestive heart failure) ([place])   . Cholecystitis   . Chronic abdominal pain   . Colon polyp    TCS 2005 TICS [place], TCS/PROPOFOL 2011  SIMPLE ADENOMAS  . Depression   . Diverticulosis 2005  . Fatty liver   . Fibromyalgia   . GERD (gastroesophageal reflux disease)   . Gout   . Hyperlipemia   . Hypothyroidism   . Internal hemorrhoids    H/o Rectal bleeding secondary to internal hemorrhoids 04/2010.  Marland Kitchen MCI (mild cognitive impairment) 12/30/2017  . Migraine   . Narcolepsy   . Obesity, morbid (more than 100 lbs over ideal weight or BMI > 40) (HCC)   . Obstructive sleep apnea on CPAP and O2  . Osteoarthritis   . Persistent atrial fibrillation ([place])    a. s/p DCCV 01/2012  . Volume overload    uses torsemide PRN   Past Surgical History:  Procedure Laterality Date  . ABDOMINAL HYSTERECTOMY  FIBROIDS 1982  . BLADDER SUSPENSION  1982  . CARDIOVERSION  02/05/2012   Procedure: CARDIOVERSION;  Surgeon: Laverda Page, MD;  Location: [facility];  Service: Cardiovascular;  Laterality: N/A;  . CARDIOVERSION N/A 06/09/2013   Procedure: CARDIOVERSION;  Surgeon: Laverda Page, MD;  Location: [facility] ENDOSCOPY;  Service: Cardiovascular;  Laterality: N/A;  h&p in file-[place]  . CARDIOVERSION N/A 08/09/2015   Procedure: CARDIOVERSION;  Surgeon: Adrian Prows, MD;  Location: [facility] ENDOSCOPY;  Service: Cardiovascular;  Laterality: N/A;  . CARDIOVERSION N/A 09/13/2015   Procedure: CARDIOVERSION;  Surgeon: Adrian Prows, MD;  Location: [facility] ENDOSCOPY;  Service: Cardiovascular;  Laterality: N/A;  . CHOLECYSTECTOMY  09/03/2012   Procedure: LAPAROSCOPIC CHOLECYSTECTOMY;  Surgeon: Jamesetta So, MD;  Location: [facility] ORS;  Service: General;  Laterality: N/A;  . COLONOSCOPY  2005   Dr. Tamala Julian: sigmoid diverticulosis  . COLONOSCOPY  June 2011   Dr. Oneida Alar: internal hemorrhoids, simple adenomas, hyperplastic polyps, needs surveillance in June 2014 with overtube and Propofol  . COLONOSCOPY WITH PROPOFOL N/A 03/19/2016   Dr. Gala Romney: multiple small tubular adenomas, diverticulosis. Surveillance in 5 years   . ERCP  10/01/2012   Procedure: ENDOSCOPIC RETROGRADE  CHOLANGIOPANCREATOGRAPHY (ERCP);  Surgeon: Daneil Dolin, MD;  Location: [facility];  Service: Endoscopy;  Laterality: N/A;  . ESOPHAGOGASTRODUODENOSCOPY (EGD) WITH PROPOFOL N/A 03/19/2016   DR. Rourk: normal esohpagus with small hiatal hernia   . Kidney Stent  1995  . POLYPECTOMY  03/19/2016   Procedure: POLYPECTOMY;  Surgeon: Daneil Dolin, MD;  Location: [facility];  Service: Endoscopy;;  cold snares  . REMOVAL OF STONES  10/01/2012   Procedure: REMOVAL OF STONES;  Surgeon: Daneil Dolin, MD;  Location: [facility];  Service: Endoscopy;  Laterality: N/A;  . RHINOPLASTY    . SPHINCTEROTOMY  10/01/2012   Procedure: SPHINCTEROTOMY;  Surgeon: Daneil Dolin, MD;  Location: [facility];  Service: Endoscopy;  Laterality: N/A;   Family History  Problem Relation Age of Onset  . Colon cancer Mother 106  . Diabetes Mother   . Asthma Mother   . Heart disease Mother   . Cerebral aneurysm Father   . Asthma Maternal Aunt   . Asthma Brother   . Heart disease Maternal Grandfather   . Heart disease Maternal Grandmother    Social History   Tobacco Use  . Smoking status: Former Smoker    Packs/day: 0.50    Years: 30.00    Pack years: 15.00    Quit date: 10/03/1989    Years since quitting: 31.5  . Smokeless tobacco: Never Used  . Tobacco comment: Quit in 1992  Substance Use Topics  . Alcohol use: No    Alcohol/week: 0.0 standard drinks    Marital Status: Widowed   ROS  Review of Systems  Constitutional: Negative for chills, decreased appetite, malaise/fatigue and weight gain.  Cardiovascular: Positive for dyspnea on exertion (chronic, worse over the last few weeks). Negative for chest pain, claudication, leg swelling, near-syncope, orthopnea, palpitations, paroxysmal nocturnal dyspnea and syncope.  Respiratory: Positive for snoring (on CPAP). Negative for shortness of breath.   Endocrine: Negative for cold intolerance.  Hematologic/Lymphatic: Does not bruise/bleed easily.  Musculoskeletal: Positive  for arthritis and back pain. Negative for joint swelling.  Gastrointestinal: Negative for abdominal pain, anorexia, diarrhea, hematochezia and melena.  Genitourinary: Positive for bladder incontinence and frequency (chronic).  Neurological: Negative for dizziness, headaches, light-headedness and weakness.  Psychiatric/Behavioral: Negative for depression and substance abuse.  All other systems reviewed and are negative.  Objective   Vitals with BMI 03/28/2021 02/02/2021 11/16/2020  Height 5\' 2"  5\' 2"  5\' 2"   Weight 280 lbs 291 lbs 299 lbs  BMI 51.2 [id number]  Systolic [id number]  Diastolic 73 59 76  Pulse 72 71 75     Physical Exam Constitutional:      Comments: Moderately built and Morbidly obese in no acute distress.  Neck:     Thyroid: No thyromegaly.     Comments: Short neck and difficult to evaluate JVP Cardiovascular:     Rate and Rhythm: Normal rate. Rhythm irregular.     Pulses:          Carotid pulses are 2+ on the right side and 2+ on the left  side.      Dorsalis pedis pulses are 2+ on the right side and 2+ on the left side.       Posterior tibial pulses are 2+ on the right side and 2+ on the left side.     Heart sounds: No murmur heard. No gallop.      Comments: S1 variable and S2 normal. Distant HS. Femoral and popliteal pulse difficult to feel due to patient's body habitus.  Pulmonary:     Effort: Pulmonary effort is normal.     Breath sounds: Normal breath sounds. No wheezing, rhonchi or rales.     Comments: Patient is dyspneic while talking throughout the exam. Abdominal:     General: Bowel sounds are normal. There is no distension.     Palpations: Abdomen is soft.     Comments: Obese. Pannus present  Musculoskeletal:     Right lower leg: No edema.     Left lower leg: No edema.     Comments: Bilateral lower legs large adipose tissue noted.  Skin:    General: Skin is warm and dry.  Neurological:     General: No focal deficit present.     Mental Status: She  is oriented to person, place, and time.    Laboratory examination:    CMP Latest Ref Rng & Units 10/01/2019 09/23/2019 09/18/2019  Glucose 65 - 99 mg/dL 112(H) 109(H) 118(H)  BUN 8 - 27 mg/dL 19 19 16   Creatinine 0.57 - 1.00 mg/dL 1.06(H) 1.00 0.83  Sodium 134 - 144 mmol/L 139 140 142  Potassium 3.5 - 5.2 mmol/L 4.1 3.8 4.3  Chloride 96 - 106 mmol/L 100 99 103  CO2 20 - 29 mmol/L 27 31 28   Calcium 8.7 - 10.3 mg/dL 9.6 9.9 9.0  Total Protein 6.5 - 8.1 g/dL - 7.4 -  Total Bilirubin 0.3 - 1.2 mg/dL - 0.5 -  Alkaline Phos 38 - 126 U/L - 81 -  AST 15 - 41 U/L - 28 -  ALT 0 - 44 U/L - 30 -   CBC Latest Ref Rng & Units 09/23/2019 12/29/2018 02/16/2018  WBC 4.0 - 10.5 K/uL 7.7 7.0 6.6  Hemoglobin 12.0 - 15.0 g/dL 14.8 14.6 13.9  Hematocrit 36.0 - 46.0 % 47.3(H) 45.8 43.4  Platelets 150 - 400 K/uL 261 257 196   Lipid Panel     Component Value Date/Time   CHOL 170 07/22/2009 0127   TRIG 129 07/22/2009 0127   HDL 48 07/22/2009 0127   CHOLHDL 3.5 Ratio 07/22/2009 0127   VLDL 26 07/22/2009 0127   LDLCALC 96 07/22/2009 0127   BNP    Component Value Date/Time   BNP 73.2 10/01/2019 1059   BNP 74.5 09/23/2019 1328    HEMOGLOBIN A1C Lab Results  Component Value Date   HGBA1C 6.3 (H) 09/02/2012   MPG 134 (H) 09/02/2012   TSH No results for input(s): TSH in the last 8760 hours.   External labs: 03/20/2021: Hemoglobin 13.4, hematocrit 41.6, MCV 90, platelet 203 Sodium 139, potassium 4.6, glucose 109, BUN 16, creatinine 0.89, GFR 66 Troponin 8-8 Magnesium 2.1 proBNP 600  01/26/2021: Sodium 141, potassium 3.8, glucose 108, BUN 17, creatinine 0.95, GFR 61 Hemoglobin 14.2, hematocrit 44.6, MCV 90, platelet 223 NT proBNP 891  Medications and allergies   Allergies  Allergen Reactions  . Meperidine Rash and Other (See Comments)    Reaction:  Bottoms out BP Other reaction(s): other/intolerance Hypotension   . Atorvastatin Other (See Comments)  Reaction:  Muscle pain   .  Febuxostat Other (See Comments)    Reaction:  Visual changes  Other reaction(s): neurological reaction Visual changes Reaction:Visual changes   . Simvastatin     Other reaction(s): other/intolerance Elevated liver enzymes   . Statins     Other reaction(s): muscle/joint pain  . Zocor [Simvastatin - High Dose] Other (See Comments)    Reaction:  Elevated liver enzymes     Current Outpatient Medications  Medication Instructions  . acetaminophen (TYLENOL) 650 mg, Oral, Every 6 hours PRN  . Cholecalciferol (D3) 50 MCG (2000 UT) TABS Oral  . colchicine 0.6 MG tablet As needed  . ELIQUIS 5 MG TABS tablet TAKE 1 TABLET TWICE A DAY  . esomeprazole (NEXIUM) 40 mg, Oral, Every evening  . fluticasone (FLONASE) 50 MCG/ACT nasal spray Nasal, As needed  . furosemide (LASIX) 40 mg, Oral, Daily  . HYDROcodone-acetaminophen (NORCO/VICODIN) 5-325 MG tablet No dose, route, or frequency recorded.  Marland Kitchen levothyroxine (SYNTHROID) 50 mcg, Oral, Daily before breakfast  . loratadine (CLARITIN) 10 mg, Oral, Daily  . LORazepam (ATIVAN) 1.5 mg, Oral, 3 times daily  . metoprolol succinate (TOPROL-XL) 100 MG 24 hr tablet TAKE 1 TABLET DAILY. TAKE WITH OR IMMEDIATELY FOLLOWING A MEAL.  . miconazole (MICOTIN) 2 % cream 1 application, Topical, As needed  . nystatin-triamcinolone ointment (MYCOLOG) 1 application, Topical, As needed  . Polyethyl Glycol-Propyl Glycol (SYSTANE OP) 2 drops, Ophthalmic, As needed, In both eyes   . tiotropium (SPIRIVA) 18 mcg, Inhalation, Daily    Radiology:  No results found. CT angio 03/20/2021: No evidence of pulmonary emboli. No acute infiltrate or pleural effusion.  Mild emphysematous changes.  Stable pretracheal lymph node.  Stable pneumobilia   Chest x-ray 03/20/2021: Stable heart size and mediastinal contours. No focal infiltrate/consolidative changes. No pleural effusion or pneumothorax.  Cardiac Studies:  CT angio pulmonary 01/24/2021: No acute pulmonary findings, negative  for PE  Chest x-ray 01/24/2021: Bibasilar opacities may represent atelectasis or infiltrates  Echocardiogram 01/10/2021:  1. Left ventricular ejection fraction, by estimation, is 60 to 65%. The left ventricle has normal function. The left ventricle has no regional wall motion abnormalities. Left ventricular diastolic function could not be evaluated.  2. Right ventricular systolic function is normal. The right ventricular size is normal. There is normal pulmonary artery systolic pressure.  3. Left atrial size was moderately dilated.  4. Right atrial size was mild to moderately dilated.  5. The mitral valve was not well visualized. No evidence of mitral valve regurgitation. No evidence of mitral stenosis.  6. The aortic valve is normal in structure. Aortic valve regurgitation is not visualized.   Echocardiogram 10/09/2019: Left ventricle cavity is normal in size. Moderate concentric hypertrophy of the left ventricle. Mild global hypokinesis. EF 45-50%. Unable to evaluate diastolic function due to atrial fibrillation.  Left atrial cavity is severely dilated. Moderate (Grade III) mitral regurgitation. Mild to moderate tricuspid regurgitation. Estimated pulmonary artery systolic pressure is 28 mmHg.   Compared to previous study on 09/20/2018, EF is reduced from 50-55% to 45-50%.  Lexiscan myoview stress test 03/29/2017: Two day protocol followed due to patient's bodily habitus. 1. The resting electrocardiogram demonstrated atrial fibrillation and normal rest repolarization, low voltage complexes. Stress EKG is non-diagnostic for ischemia as it a pharmacologic stress using Lexiscan. 2. REST and STRESS images demonstrate decreased tracer uptake in the basal inferoseptal, basal inferior, mid inferoseptal and apical segments of the left ventricle. These defects are related to breast attenuation. Mild ischemia  in this region cannot be completely excluded. Gated SPECT images reveal normal myocardial  thickening and wall motion. The left ventricular ejection fraction was calculated or visually estimated to be 60%. This is a low risk study, clinical correlation recommended in view of patient's bodily habitus.  EKG   EKG 03/28/2021: Atrial fibrillation with controlled ventricular response at a rate of 82 bpm.  Left axis. Poor R wave progression, cannot exclude anteroseptal infarct old.  Old inferior infarct.  Nonspecific T wave abnormality.  Low voltage complexes, consider pulmonary disease pattern.  Compared to EKG 02/02/2021, no significant change.  EKG 01/24/2021 (external): Atrial fibrillation with controlled ventricular response at a rate of 73 bpm.  Low voltage complexes.  Assessment     ICD-10-CM   1. Chronic diastolic (congestive) heart failure (HCC)  I50.32 PCV MYOCARDIAL PERFUSION WITH LEXISCAN    Basic metabolic panel    Magnesium  2. Permanent atrial fibrillation (HCC)  I48.21   3. Class 3 severe obesity due to excess calories with serious comorbidity and body mass index (BMI) of 50.0 to 59.9 in adult (HCC)  E66.01    Z68.43   4. SOB (shortness of breath)  R06.02 EKG 12-Lead    Ambulatory referral to Pulmonology  5. Dyspnea on exertion  R06.00 PCV MYOCARDIAL PERFUSION WITH LEXISCAN    Pro b natriuretic peptide (BNP)[facility])    Basic metabolic panel    Magnesium    CHA2DS2-VASc Score is 4.  Yearly risk of stroke: 4% (A, F, HTN, CHF).   No orders of the defined types were placed in this encounter.  Medications Discontinued During This Encounter  Medication Reason  . Ergocalciferol (VITAMIN D2) 2000 units TABS Error    Recommendations:   [name]  is a 80 y.o. Caucasian female with permanent atrial fibrillation, morbid obesity, depression, chronic diastolic CHF, hypertension, frequency and urgency of urination that is chronic, very sensitive to medications.  Patient has had multiple hospitalizations for acute on chronic diastolic heart failure  in March/2022, and most recently again 03/20/2021.  Patient presented to emergency department at [facility] with shortness of breath and volume overload, at that time work-up was unremarkable and patient was therefore diuresed with 40 mg Lasix and discharged home.  Patient now presents for follow-up of chronic diastolic heart failure, hypertension, permanent atrial fibrillation.  Patient continues to complain of dyspnea on exertion worse compared to baseline over the last few weeks.  Patient was recently evaluated in the emergency department and work-up was relatively unremarkable regarding heart failure.  proBNP was normal, chest x-ray without abnormality.  Patient's weight has also trended down since last office visit.  Given patient's symptoms recommend Lasix 40 mg twice daily for the next 5 days.  We will then repeat labs including BMP, proBNP, and magnesium.  Reviewed and discussed with patient recent echocardiogram results, details above.  Echocardiogram revealed LVEF 60-65% with dilation of left and right atria.  Given that work-up for acute heart failure has been relatively unremarkable and patient remains well rate controlled, suspect patient may have underlying pulmonary condition contributing to dyspnea on exertion.   Given patient's symptoms and cardiovascular risk factors cannot rule out anginal equivalent. Will therefore obtain lexiscan nuclear stress test as patient is not a treadmill candidate.   Follow up in 2 weeks, sooner if needed, for dyspnea on exertion.    Alethia Berthold, PA-C 03/28/2021, 2:04 PM Office: [phone]

## 2021-03-28 ENCOUNTER — Other Ambulatory Visit: Payer: Self-pay

## 2021-03-28 ENCOUNTER — Encounter: Payer: Self-pay | Admitting: Student

## 2021-03-28 ENCOUNTER — Ambulatory Visit: Payer: Medicare Other | Admitting: Student

## 2021-03-28 VITALS — BP 120/73 | HR 72 | Temp 98.2°F | Resp 17 | Ht 62.0 in | Wt 280.0 lb

## 2021-03-28 DIAGNOSIS — I4821 Permanent atrial fibrillation: Secondary | ICD-10-CM

## 2021-03-28 DIAGNOSIS — R0602 Shortness of breath: Secondary | ICD-10-CM

## 2021-03-28 DIAGNOSIS — R0609 Other forms of dyspnea: Secondary | ICD-10-CM

## 2021-03-28 DIAGNOSIS — I5032 Chronic diastolic (congestive) heart failure: Secondary | ICD-10-CM | POA: Diagnosis not present

## 2021-03-28 DIAGNOSIS — R0600 Dyspnea, unspecified: Secondary | ICD-10-CM

## 2021-03-28 DIAGNOSIS — Z6841 Body Mass Index (BMI) 40.0 and over, adult: Secondary | ICD-10-CM | POA: Diagnosis not present

## 2021-04-03 ENCOUNTER — Other Ambulatory Visit: Payer: Self-pay

## 2021-04-03 ENCOUNTER — Ambulatory Visit: Payer: Medicare Other

## 2021-04-03 DIAGNOSIS — R0609 Other forms of dyspnea: Secondary | ICD-10-CM | POA: Diagnosis not present

## 2021-04-03 DIAGNOSIS — I5032 Chronic diastolic (congestive) heart failure: Secondary | ICD-10-CM

## 2021-04-03 DIAGNOSIS — R0600 Dyspnea, unspecified: Secondary | ICD-10-CM

## 2021-04-08 DIAGNOSIS — R7989 Other specified abnormal findings of blood chemistry: Secondary | ICD-10-CM | POA: Diagnosis not present

## 2021-04-08 DIAGNOSIS — R079 Chest pain, unspecified: Secondary | ICD-10-CM | POA: Diagnosis not present

## 2021-04-08 DIAGNOSIS — K449 Diaphragmatic hernia without obstruction or gangrene: Secondary | ICD-10-CM | POA: Diagnosis not present

## 2021-04-08 DIAGNOSIS — R0602 Shortness of breath: Secondary | ICD-10-CM | POA: Diagnosis not present

## 2021-04-08 DIAGNOSIS — N281 Cyst of kidney, acquired: Secondary | ICD-10-CM | POA: Diagnosis not present

## 2021-04-08 DIAGNOSIS — R945 Abnormal results of liver function studies: Secondary | ICD-10-CM | POA: Diagnosis not present

## 2021-04-08 DIAGNOSIS — K219 Gastro-esophageal reflux disease without esophagitis: Secondary | ICD-10-CM | POA: Diagnosis not present

## 2021-04-08 DIAGNOSIS — R0600 Dyspnea, unspecified: Secondary | ICD-10-CM | POA: Diagnosis not present

## 2021-04-08 DIAGNOSIS — Z79899 Other long term (current) drug therapy: Secondary | ICD-10-CM | POA: Diagnosis not present

## 2021-04-08 DIAGNOSIS — K5730 Diverticulosis of large intestine without perforation or abscess without bleeding: Secondary | ICD-10-CM | POA: Diagnosis not present

## 2021-04-08 DIAGNOSIS — Z888 Allergy status to other drugs, medicaments and biological substances status: Secondary | ICD-10-CM | POA: Diagnosis not present

## 2021-04-08 DIAGNOSIS — J984 Other disorders of lung: Secondary | ICD-10-CM | POA: Diagnosis not present

## 2021-04-08 DIAGNOSIS — I4891 Unspecified atrial fibrillation: Secondary | ICD-10-CM | POA: Diagnosis not present

## 2021-04-08 DIAGNOSIS — Z87891 Personal history of nicotine dependence: Secondary | ICD-10-CM | POA: Diagnosis not present

## 2021-04-08 DIAGNOSIS — Z7901 Long term (current) use of anticoagulants: Secondary | ICD-10-CM | POA: Diagnosis not present

## 2021-04-08 DIAGNOSIS — K760 Fatty (change of) liver, not elsewhere classified: Secondary | ICD-10-CM | POA: Diagnosis not present

## 2021-04-10 DIAGNOSIS — R0600 Dyspnea, unspecified: Secondary | ICD-10-CM | POA: Diagnosis not present

## 2021-04-10 DIAGNOSIS — I5032 Chronic diastolic (congestive) heart failure: Secondary | ICD-10-CM | POA: Diagnosis not present

## 2021-04-10 NOTE — Progress Notes (Signed)
Primary Physician/Referring:  Denny Levy, PA  Patient ID: [name], female    DOB: 11/27/1940, 80 y.o.   MRN: [id number]  Chief Complaint  Patient presents with  . Shortness of Breath  . Congestive Heart Failure  . Atrial Fibrillation  . Follow-up    2 weeks   HPI:    [name]  is a 80 y.o. Caucasian female with permanent atrial fibrillation, morbid obesity, depression, chronic diastolic CHF, hypertension, frequency and urgency of urination that is chronic, very sensitive to medications.  Patient has had multiple hospitalizations for acute on chronic diastolic heart failure in March 2022. Most recently patient presented to [facility] emergency department with shortness of breath. Work up was unremarkable and patient's symptoms resolved with Duoneb so she was discharged home.   Patient now presents for follow up. Patient continues to have occasional episodes of shortness of breath at rest, with continued dyspnea on exertion. Patient monitors her oxygen saturation during these episodes and it remains >92%. She denies other associated symptoms. Patient denies chest pain, palpitations, dizziness, orthopnea, PND.  Denies syncope, near syncope, leg swelling. Since recent emergency room visit patient is using daily breathing treatments which she reports minimally improve dyspnea.   She also continues to complain of bilateral lower extremity neuropathy.  Past Medical History:  Diagnosis Date  . A-fib ([place])   . Allergic rhinitis   . Anxiety   . Asthma    pt states she "believe it is more allergies"  . Cellulitis and abscess of left leg 01/2020  . CHF (congestive heart failure) ([place])   . Cholecystitis   . Chronic abdominal pain   . Colon polyp    TCS 2005 TICS [place], TCS/PROPOFOL 2011 SIMPLE ADENOMAS  . Depression   . Diverticulosis 2005  . Fatty liver   . Fibromyalgia   . GERD (gastroesophageal reflux disease)   . Gout   . Hyperlipemia   . Hypothyroidism   . Internal  hemorrhoids    H/o Rectal bleeding secondary to internal hemorrhoids 04/2010.  Marland Kitchen MCI (mild cognitive impairment) 12/30/2017  . Migraine   . Narcolepsy   . Obesity, morbid (more than 100 lbs over ideal weight or BMI > 40) (HCC)   . Obstructive sleep apnea on CPAP and O2  . Osteoarthritis   . Persistent atrial fibrillation ([place])    a. s/p DCCV 01/2012  . Volume overload    uses torsemide PRN   Past Surgical History:  Procedure Laterality Date  . ABDOMINAL HYSTERECTOMY  FIBROIDS 1982  . BLADDER SUSPENSION  1982  . CARDIOVERSION  02/05/2012   Procedure: CARDIOVERSION;  Surgeon: Laverda Page, MD;  Location: [facility];  Service: Cardiovascular;  Laterality: N/A;  . CARDIOVERSION N/A 06/09/2013   Procedure: CARDIOVERSION;  Surgeon: Laverda Page, MD;  Location: [facility] ENDOSCOPY;  Service: Cardiovascular;  Laterality: N/A;  h&p in file-[place]  . CARDIOVERSION N/A 08/09/2015   Procedure: CARDIOVERSION;  Surgeon: Adrian Prows, MD;  Location: [facility] ENDOSCOPY;  Service: Cardiovascular;  Laterality: N/A;  . CARDIOVERSION N/A 09/13/2015   Procedure: CARDIOVERSION;  Surgeon: Adrian Prows, MD;  Location: [facility] ENDOSCOPY;  Service: Cardiovascular;  Laterality: N/A;  . CHOLECYSTECTOMY  09/03/2012   Procedure: LAPAROSCOPIC CHOLECYSTECTOMY;  Surgeon: Jamesetta So, MD;  Location: [facility] ORS;  Service: General;  Laterality: N/A;  . COLONOSCOPY  2005   Dr. Tamala Julian: sigmoid diverticulosis  . COLONOSCOPY  June 2011   Dr. Oneida Alar: internal hemorrhoids, simple adenomas, hyperplastic polyps, needs surveillance in  June 2014 with overtube and Propofol  . COLONOSCOPY WITH PROPOFOL N/A 03/19/2016   Dr. Gala Romney: multiple small tubular adenomas, diverticulosis. Surveillance in 5 years   . ERCP  10/01/2012   Procedure: ENDOSCOPIC RETROGRADE CHOLANGIOPANCREATOGRAPHY (ERCP);  Surgeon: Daneil Dolin, MD;  Location: [facility] ORS;  Service: Endoscopy;  Laterality: N/A;  . ESOPHAGOGASTRODUODENOSCOPY (EGD) WITH PROPOFOL N/A 03/19/2016   DR. Rourk: normal  esohpagus with small hiatal hernia   . Kidney Stent  1995  . POLYPECTOMY  03/19/2016   Procedure: POLYPECTOMY;  Surgeon: Daneil Dolin, MD;  Location: [facility] ENDO SUITE;  Service: Endoscopy;;  cold snares  . REMOVAL OF STONES  10/01/2012   Procedure: REMOVAL OF STONES;  Surgeon: Daneil Dolin, MD;  Location: [facility] ORS;  Service: Endoscopy;  Laterality: N/A;  . RHINOPLASTY    . SPHINCTEROTOMY  10/01/2012   Procedure: SPHINCTEROTOMY;  Surgeon: Daneil Dolin, MD;  Location: [facility] ORS;  Service: Endoscopy;  Laterality: N/A;   Family History  Problem Relation Age of Onset  . Colon cancer Mother 25  . Diabetes Mother   . Asthma Mother   . Heart disease Mother   . Cerebral aneurysm Father   . Asthma Maternal Aunt   . Asthma Brother   . Heart disease Maternal Grandfather   . Heart disease Maternal Grandmother    Social History   Tobacco Use  . Smoking status: Former Smoker    Packs/day: 0.50    Years: 30.00    Pack years: 15.00    Types: Cigarettes    Quit date: 10/03/1989    Years since quitting: 31.5  . Smokeless tobacco: Never Used  . Tobacco comment: Quit in 1992  Substance Use Topics  . Alcohol use: No    Alcohol/week: 0.0 standard drinks    Marital Status: Widowed   ROS  Review of Systems  Constitutional: Negative for chills, decreased appetite, malaise/fatigue and weight gain.  Cardiovascular: Positive for dyspnea on exertion (chronic, worse over the last few weeks). Negative for chest pain, claudication, leg swelling, near-syncope, orthopnea, palpitations, paroxysmal nocturnal dyspnea and syncope.  Respiratory: Positive for snoring (on CPAP). Negative for shortness of breath.   Endocrine: Negative for cold intolerance.  Hematologic/Lymphatic: Does not bruise/bleed easily.  Musculoskeletal: Positive for arthritis and back pain. Negative for joint swelling.  Gastrointestinal: Negative for abdominal pain, anorexia, diarrhea, hematochezia and melena.  Genitourinary: Positive for  bladder incontinence and frequency (chronic).  Neurological: Negative for dizziness, headaches, light-headedness and weakness.  Psychiatric/Behavioral: Negative for depression and substance abuse.  All other systems reviewed and are negative.  Objective   Vitals with BMI 04/11/2021 03/28/2021 02/02/2021  Height 5\' 2"  5\' 2"  5\' 2"   Weight 282 lbs 280 lbs 291 lbs  BMI 51.57 24.2 35.36  Systolic 144 315 400  Diastolic 49 73 59  Pulse 77 72 71     Physical Exam Constitutional:      Comments: Moderately built and Morbidly obese in no acute distress.  Neck:     Thyroid: No thyromegaly.     Comments: Short neck and difficult to evaluate JVP Cardiovascular:     Rate and Rhythm: Normal rate. Rhythm irregular.     Pulses:          Carotid pulses are 2+ on the right side and 2+ on the left side.      Dorsalis pedis pulses are 2+ on the right side and 2+ on the left side.       Posterior tibial pulses  are 2+ on the right side and 2+ on the left side.     Heart sounds: No murmur heard. No gallop.      Comments: S1 variable and S2 normal. Distant HS. Femoral and popliteal pulse difficult to feel due to patient's body habitus.  Pulmonary:     Effort: Pulmonary effort is normal.     Breath sounds: Normal breath sounds. No wheezing, rhonchi or rales.     Comments: Patient is dyspneic while talking throughout the exam. Abdominal:     General: Bowel sounds are normal. There is no distension.     Palpations: Abdomen is soft.     Comments: Obese. Pannus present  Musculoskeletal:     Right lower leg: No edema.     Left lower leg: No edema.     Comments: Bilateral lower legs large adipose tissue noted.  Skin:    General: Skin is warm and dry.  Neurological:     General: No focal deficit present.     Mental Status: She is oriented to person, place, and time.    Laboratory examination:    CMP Latest Ref Rng & Units 04/10/2021 10/01/2019 09/23/2019  Glucose 65 - 99 mg/dL 77 112(H) 109(H)  BUN 8  - 27 mg/dL 16 19 19   Creatinine 0.57 - 1.00 mg/dL 0.84 1.06(H) 1.00  Sodium 134 - 144 mmol/L 143 139 140  Potassium 3.5 - 5.2 mmol/L 4.2 4.1 3.8  Chloride 96 - 106 mmol/L 101 100 99  CO2 20 - 29 mmol/L 27 27 31   Calcium 8.7 - 10.3 mg/dL 9.9 9.6 9.9  Total Protein 6.5 - 8.1 g/dL - - 7.4  Total Bilirubin 0.3 - 1.2 mg/dL - - 0.5  Alkaline Phos 38 - 126 U/L - - 81  AST 15 - 41 U/L - - 28  ALT 0 - 44 U/L - - 30   CBC Latest Ref Rng & Units 09/23/2019 12/29/2018 02/16/2018  WBC 4.0 - 10.5 K/uL 7.7 7.0 6.6  Hemoglobin 12.0 - 15.0 g/dL 14.8 14.6 13.9  Hematocrit 36.0 - 46.0 % 47.3(H) 45.8 43.4  Platelets 150 - 400 K/uL 261 257 196   Lipid Panel     Component Value Date/Time   CHOL 170 07/22/2009 0127   TRIG 129 07/22/2009 0127   HDL 48 07/22/2009 0127   CHOLHDL 3.5 Ratio 07/22/2009 0127   VLDL 26 07/22/2009 0127   LDLCALC 96 07/22/2009 0127   BNP    Component Value Date/Time   BNP 73.2 10/01/2019 1059   BNP 74.5 09/23/2019 1328    HEMOGLOBIN A1C Lab Results  Component Value Date   HGBA1C 6.3 (H) 09/02/2012   MPG 134 (H) 09/02/2012   TSH No results for input(s): TSH in the last 8760 hours.   External labs: 03/20/2021: Hemoglobin 13.4, hematocrit 41.6, MCV 90, platelet 203 Sodium 139, potassium 4.6, glucose 109, BUN 16, creatinine 0.89, GFR 66 Troponin 8-8 Magnesium 2.1 proBNP 600  01/26/2021: Sodium 141, potassium 3.8, glucose 108, BUN 17, creatinine 0.95, GFR 61 Hemoglobin 14.2, hematocrit 44.6, MCV 90, platelet 223 NT proBNP 891  Medications and allergies   Allergies  Allergen Reactions  . Meperidine Rash and Other (See Comments)    Reaction:  Bottoms out BP Other reaction(s): other/intolerance Hypotension   . Atorvastatin Other (See Comments)    Reaction:  Muscle pain   . Febuxostat Other (See Comments)    Reaction:  Visual changes  Other reaction(s): neurological reaction Visual changes Reaction:Visual changes   .  Simvastatin     Other reaction(s):  other/intolerance Elevated liver enzymes   . Statins     Other reaction(s): muscle/joint pain  . Zocor [Simvastatin - High Dose] Other (See Comments)    Reaction:  Elevated liver enzymes     Current Outpatient Medications  Medication Instructions  . acetaminophen (TYLENOL) 650 mg, Oral, Every 6 hours PRN  . Cholecalciferol (D3) 50 MCG (2000 UT) TABS Oral  . colchicine 0.6 MG tablet As needed  . ELIQUIS 5 MG TABS tablet TAKE 1 TABLET TWICE A DAY  . esomeprazole (NEXIUM) 40 mg, Oral, Every evening  . fluticasone (FLONASE) 50 MCG/ACT nasal spray Nasal, As needed  . furosemide (LASIX) 40 mg, Oral, Daily  . HYDROcodone-acetaminophen (NORCO/VICODIN) 5-325 MG tablet No dose, route, or frequency recorded.  Marland Kitchen levothyroxine (SYNTHROID) 50 mcg, Oral, Daily before breakfast  . loratadine (CLARITIN) 10 mg, Oral, Daily  . LORazepam (ATIVAN) 1.5 mg, Oral, 3 times daily  . metoprolol succinate (TOPROL-XL) 100 MG 24 hr tablet TAKE 1 TABLET DAILY. TAKE WITH OR IMMEDIATELY FOLLOWING A MEAL.  . metoprolol tartrate (LOPRESSOR) 50 mg, Oral,  Once, Take 50 mg the night before and the morning of CTA.  . miconazole (MICOTIN) 2 % cream 1 application, Topical, As needed  . nystatin-triamcinolone ointment (MYCOLOG) 1 application, Topical, As needed  . Polyethyl Glycol-Propyl Glycol (SYSTANE OP) 2 drops, Ophthalmic, As needed, In both eyes   . tiotropium (SPIRIVA) 18 mcg, Inhalation, Daily    Radiology:  No results found. CT angio 03/20/2021: No evidence of pulmonary emboli. No acute infiltrate or pleural effusion.  Mild emphysematous changes.  Stable pretracheal lymph node.  Stable pneumobilia   Chest x-ray 03/20/2021: Stable heart size and mediastinal contours. No focal infiltrate/consolidative changes. No pleural effusion or pneumothorax.  Cardiac Studies:   PCV MYOCARDIAL PERFUSION WITH LEXISCAN 04/03/2021 Lexiscan nuclear stress test performed using 1-day protocol. Absent tracer uptake in entire  inferior myocardium likely due to large breast tissue attenuation. Imaging performed in sitting position. BMI 51. Ischemia in this region cannot be excluded.  Stress LVEF 71%. Intermediate risk study. Clinical correlation recommended.  CT angio pulmonary 01/24/2021: No acute pulmonary findings, negative for PE  Chest x-ray 01/24/2021: Bibasilar opacities may represent atelectasis or infiltrates  Echocardiogram 01/10/2021:  1. Left ventricular ejection fraction, by estimation, is 60 to 65%. The left ventricle has normal function. The left ventricle has no regional wall motion abnormalities. Left ventricular diastolic function could not be evaluated.  2. Right ventricular systolic function is normal. The right ventricular size is normal. There is normal pulmonary artery systolic pressure.  3. Left atrial size was moderately dilated.  4. Right atrial size was mild to moderately dilated.  5. The mitral valve was not well visualized. No evidence of mitral valve regurgitation. No evidence of mitral stenosis.  6. The aortic valve is normal in structure. Aortic valve regurgitation is not visualized.   Echocardiogram 10/09/2019: Left ventricle cavity is normal in size. Moderate concentric hypertrophy of the left ventricle. Mild global hypokinesis. EF 45-50%. Unable to evaluate diastolic function due to atrial fibrillation.  Left atrial cavity is severely dilated. Moderate (Grade III) mitral regurgitation. Mild to moderate tricuspid regurgitation. Estimated pulmonary artery systolic pressure is 28 mmHg.   Compared to previous study on 09/20/2018, EF is reduced from 50-55% to 45-50%.  Lexiscan myoview stress test 03/29/2017: Two day protocol followed due to patient's bodily habitus. 1. The resting electrocardiogram demonstrated atrial fibrillation and normal rest repolarization, low voltage complexes. Stress  EKG is non-diagnostic for ischemia as it a pharmacologic stress using Lexiscan. 2. REST and  STRESS images demonstrate decreased tracer uptake in the basal inferoseptal, basal inferior, mid inferoseptal and apical segments of the left ventricle. These defects are related to breast attenuation. Mild ischemia in this region cannot be completely excluded. Gated SPECT images reveal normal myocardial thickening and wall motion. The left ventricular ejection fraction was calculated or visually estimated to be 60%. This is a low risk study, clinical correlation recommended in view of patient's bodily habitus.  EKG   EKG 03/28/2021: Atrial fibrillation with controlled ventricular response at a rate of 82 bpm.  Left axis. Poor R wave progression, cannot exclude anteroseptal infarct old.  Old inferior infarct.  Nonspecific T wave abnormality.  Low voltage complexes, consider pulmonary disease pattern.  Compared to EKG 02/02/2021, no significant change.  EKG 01/24/2021 (external): Atrial fibrillation with controlled ventricular response at a rate of 73 bpm.  Low voltage complexes.  Assessment     ICD-10-CM   1. Chronic diastolic (congestive) heart failure (HCC)  I50.32 metoprolol tartrate (LOPRESSOR) 50 MG tablet    CT CORONARY FRACTIONAL FLOW RESERVE DATA PREP    CT CORONARY FRACTIONAL FLOW RESERVE FLUID ANALYSIS  2. Permanent atrial fibrillation (HCC)  I48.21   3. Dyspnea on exertion  R06.00 CT CORONARY FRACTIONAL FLOW RESERVE DATA PREP    CT CORONARY FRACTIONAL FLOW RESERVE FLUID ANALYSIS  4. Morbid obesity due to excess calories (HCC)  E66.01   5. Physical deconditioning  R53.81   6. Abnormal stress test  R94.39 CT CORONARY FRACTIONAL FLOW RESERVE DATA PREP    CT CORONARY FRACTIONAL FLOW RESERVE FLUID ANALYSIS  7. Abnormal findings on diagnostic imaging of heart and coronary circulation   R93.1 CT CORONARY FRACTIONAL FLOW RESERVE DATA PREP    CT CORONARY FRACTIONAL FLOW RESERVE FLUID ANALYSIS    CHA2DS2-VASc Score is 4.  Yearly risk of stroke: 4% (A, F, HTN, CHF).   Meds ordered this  encounter  Medications  . metoprolol tartrate (LOPRESSOR) 50 MG tablet    Sig: Take 1 tablet (50 mg total) by mouth once for 1 dose. Take 50 mg the night before and the morning of CTA.    Dispense:  180 tablet    Refill:  3   There are no discontinued medications.  Recommendations:   [name]  is a 80 y.o. Caucasian female with permanent atrial fibrillation, morbid obesity, depression, chronic diastolic CHF, hypertension, frequency and urgency of urination that is chronic, very sensitive to medications.  Patient has had multiple hospitalizations for acute on chronic diastolic heart failure in March 2022. Most recently patient presented to [facility] emergency department with shortness of breath. Work up was unremarkable and patient's symptoms resolved with Duoneb so she was discharged home.   Patient now presents for follow up.  She continues to have episodes of shortness of breath both at rest and with exertion.  Reviewed and discussed with patient regarding stress test results, which were intermediate risk due to absent tracer uptake in entire inferior myocardium, although this is likely due to large fresh tissue attenuation ischemia cannot be excluded.  Given patient's continued symptoms discussed management options including proceeding with cardiac catheterization versus coronary CTA.  Shared decision was to proceed with coronary CTA to further evaluate for underlying ischemia.  Suspect patient's shortness of breath to be multifactorial including obesity and deconditioning as well. Patient also has upcoming appointment with pulmonology for further evaluation of dyspnea.  Follow up in 3 months, sooner if needed, for dyspnea, chronic diastolic heart failure.     Alethia Berthold, PA-C 04/18/2021, 8:44 AM Office: [phone]

## 2021-04-11 ENCOUNTER — Other Ambulatory Visit: Payer: Self-pay

## 2021-04-11 ENCOUNTER — Ambulatory Visit: Payer: Medicare Other | Admitting: Student

## 2021-04-11 ENCOUNTER — Encounter: Payer: Self-pay | Admitting: Student

## 2021-04-11 VITALS — BP 104/49 | HR 77 | Temp 97.3°F | Resp 12 | Ht 62.0 in | Wt 282.0 lb

## 2021-04-11 DIAGNOSIS — R0609 Other forms of dyspnea: Secondary | ICD-10-CM

## 2021-04-11 DIAGNOSIS — R9439 Abnormal result of other cardiovascular function study: Secondary | ICD-10-CM

## 2021-04-11 DIAGNOSIS — R931 Abnormal findings on diagnostic imaging of heart and coronary circulation: Secondary | ICD-10-CM

## 2021-04-11 DIAGNOSIS — R5381 Other malaise: Secondary | ICD-10-CM | POA: Diagnosis not present

## 2021-04-11 DIAGNOSIS — I4821 Permanent atrial fibrillation: Secondary | ICD-10-CM | POA: Diagnosis not present

## 2021-04-11 DIAGNOSIS — R0600 Dyspnea, unspecified: Secondary | ICD-10-CM

## 2021-04-11 DIAGNOSIS — I5032 Chronic diastolic (congestive) heart failure: Secondary | ICD-10-CM

## 2021-04-11 LAB — BASIC METABOLIC PANEL
BUN/Creatinine Ratio: 19 (ref 12–28)
BUN: 16 mg/dL (ref 8–27)
CO2: 27 mmol/L (ref 20–29)
Calcium: 9.9 mg/dL (ref 8.7–10.3)
Chloride: 101 mmol/L (ref 96–106)
Creatinine, Ser: 0.84 mg/dL (ref 0.57–1.00)
Glucose: 77 mg/dL (ref 65–99)
Potassium: 4.2 mmol/L (ref 3.5–5.2)
Sodium: 143 mmol/L (ref 134–144)
eGFR: 71 mL/min/{1.73_m2} (ref >59)

## 2021-04-11 LAB — PRO B NATRIURETIC PEPTIDE: NT-Pro BNP: 691 pg/mL (ref 0–738)

## 2021-04-11 LAB — MAGNESIUM: Magnesium: 2 mg/dL (ref 1.6–2.3)

## 2021-04-14 DIAGNOSIS — Z6841 Body Mass Index (BMI) 40.0 and over, adult: Secondary | ICD-10-CM | POA: Diagnosis not present

## 2021-04-14 DIAGNOSIS — I482 Chronic atrial fibrillation, unspecified: Secondary | ICD-10-CM | POA: Diagnosis not present

## 2021-04-14 DIAGNOSIS — I509 Heart failure, unspecified: Secondary | ICD-10-CM | POA: Diagnosis not present

## 2021-04-14 DIAGNOSIS — R0602 Shortness of breath: Secondary | ICD-10-CM | POA: Diagnosis not present

## 2021-04-14 DIAGNOSIS — N3946 Mixed incontinence: Secondary | ICD-10-CM | POA: Diagnosis not present

## 2021-04-14 DIAGNOSIS — I10 Essential (primary) hypertension: Secondary | ICD-10-CM | POA: Diagnosis not present

## 2021-04-14 DIAGNOSIS — J309 Allergic rhinitis, unspecified: Secondary | ICD-10-CM | POA: Diagnosis not present

## 2021-04-18 MED ORDER — METOPROLOL TARTRATE 50 MG PO TABS: 50.0000 mg | ORAL_TABLET | Freq: Once | ORAL | 3 refills | Status: DC

## 2021-05-01 ENCOUNTER — Other Ambulatory Visit: Payer: Self-pay

## 2021-05-01 ENCOUNTER — Encounter: Payer: Self-pay | Admitting: Internal Medicine

## 2021-05-01 ENCOUNTER — Ambulatory Visit (INDEPENDENT_AMBULATORY_CARE_PROVIDER_SITE_OTHER): Payer: Medicare Other | Admitting: Internal Medicine

## 2021-05-01 DIAGNOSIS — R0600 Dyspnea, unspecified: Secondary | ICD-10-CM | POA: Diagnosis not present

## 2021-05-01 DIAGNOSIS — R0609 Other forms of dyspnea: Secondary | ICD-10-CM

## 2021-05-01 MED ORDER — STIOLTO RESPIMAT 2.5-2.5 MCG/ACT IN AERS: 2.0000 | INHALATION_SPRAY | Freq: Every day | RESPIRATORY_TRACT | 0 refills | Status: DC

## 2021-05-01 MED ORDER — STIOLTO RESPIMAT 2.5-2.5 MCG/ACT IN AERS: 2.0000 | INHALATION_SPRAY | Freq: Every day | RESPIRATORY_TRACT | 11 refills | Status: AC

## 2021-05-01 NOTE — Assessment & Plan Note (Addendum)
Onset 2015 Echo 01/12/14 - Left ventricle: The cavity size was mildly dilated. Wall thickness was increased in a pattern of mild LVH. Systolic function was normal. The estimated ejection fraction was in the range of 55% to 60%. Images were inadequate for LV wall motion assessment. Features are consistent with a pseudonormal left ventricular filling pattern, with concomitant abnormal relaxation and increased filling pressure (grade 2 diastolic dysfunction). Doppler parameters are consistent with high ventricular filling pressure. - Left atrium: The atrium was severely dilated. - Right ventricle: The cavity size was mildly dilated. Systolic function was normal. - Right atrium: The atrium was moderately dilated. - Tricuspid valve: Mild regurgitation. - Systemic veins: IVC was mildly dilated with normal respirophasic variation; estimated CVP 8 mmHg CTa 03/20/21  Neg pe/ mild emphysema - 05/01/2021  After extensive coaching inhaler device,  effectiveness =    90% with smi > trial of stiolto x 2 weeks, not to refill if doesn't help   Clinically her problem is purely restritive (from [place] ) and/or related to afib with G 2 diastolic dysfunction as reflected by severe LAE previously but reasonable to do empirical trial for copd with daily, not prn, LAMA combined with LABA per ATS recs but no need to fill rx if 2 weeks sample doesn't help.  Needs sleep medicine coordinated in [place] where here pain management doctors reside and she plans to live going forward (had sleep medicine in [place] which is even less praactical) and f/u here prn with PFTs if not any better on stiolto though I told her this would likely be very low yield.

## 2021-05-01 NOTE — Patient Instructions (Signed)
Stiolto 2 puffs each am x 2 weeks to see if it helps your breathing and if so call for refills.  I strongly recommend you focus all your care thru [facility] since it's local for you but if you want to see one of our sleep doctors please let me know.  GERD (REFLUX)  is an extremely common cause of respiratory symptoms just like yours , many times with no obvious heartburn at all.    It can be treated with medication, but also with lifestyle changes including elevation of the head of your bed (ideally with 6-8inch blocks under the headboard of your bed),  Smoking cessation, avoidance of late meals, excessive alcohol, and avoid fatty foods, chocolate, peppermint, colas, red wine, and acidic juices such as orange juice.  NO MINT OR MENTHOL PRODUCTS SO NO COUGH DROPS  USE SUGARLESS CANDY INSTEAD (Jolley ranchers or Stover's or Life Savers) or even ice chips will also do - the key is to swallow to prevent all throat clearing. NO OIL BASED VITAMINS - use powdered substitutes.  Avoid fish oil when coughing.  Pulmonary follow up is as needed  - PFT's would be the next logical step if not happy with the stiolto.

## 2021-05-01 NOTE — Progress Notes (Signed)
[name], female    DOB: 11/29/1940   MRN: [id number]   Brief patient profile:  61 yowf quit smoking 1990 when bad bronchitis/sob x 7 months recovery a lot better weight 260  And ever since still needed freq ov's  for breathing problems dx as asthma but no need maint rx but changed for the worse since started on rx for afib ? Inderal  in 2014 and turned into more daily symptoms of sob esp bending over and walking across the room referred to pulmonary clinic  05/03/2014 by Dr Nadyne Coombes for eval sob not responding to albuterol - already changed to lopressor and no better   Seen 04/02/14 rec Nexium Take 30-60 min before first meal of the day  Only use your albuterol as a rescue medication Work on inhaler technique  GERD diet/ lifestyle     History of Present Illness  05/01/2021  Pulmonary/ 1st office eval/[name]  Now up to 291/ w/c bound with osa/noct hypoxemia / doe no better on spiriva but not using daily  Chief Complaint  Patient presents with  . Pulmonary Consult    Referred by Jennifer Cruel, PA. Pt seen here in the past, last visit in June 2015. Pt c/o worsening SOB over the past year. She states she is SOB "all the time"- with or without any exertion.   Dyspnea:  At rest x months though says sats always ok  Cough: none  Sleep: cpap and 2lpmx months  Notes that p 8 hours sleeping well on cpap >> feels short of breath when wakes up/ does not use daytime 02 or cpap  SABA use: none   No obvious day to day or daytime variability or assoc excess/ purulent sputum or mucus plugs or hemoptysis or cp or chest tightness, subjective wheeze or overt sinus or hb symptoms.   Sleeping as above  without nocturnal   exacerbation  of respiratory  c/o's or need for noct saba. Also denies any obvious fluctuation of symptoms with weather or environmental changes or other aggravating or alleviating factors except as outlined above   No unusual exposure hx or h/o childhood pna/ asthma or knowledge of premature  birth.  Current Allergies, Complete Past Medical History, Past Surgical History, Family History, and Social History were reviewed in [employer] record.  ROS  The following are not active complaints unless bolded Hoarseness, sore throat, dysphagia, dental problems, itching, sneezing,  nasal congestion or discharge of excess mucus or purulent secretions, ear ache,   fever, chills, sweats, unintended wt loss or wt gain, classically pleuritic or exertional cp,  orthopnea pnd or arm/hand swelling  or leg swelling, presyncope, palpitations, abdominal pain, anorexia, nausea, vomiting, diarrhea  or change in bowel habits or change in bladder habits, change in stools or change in urine, dysuria, hematuria,  rash, arthralgias, visual complaints, headache, numbness, weakness or ataxia or problems with walking or coordination,  change in mood or  memory.           Past Medical History:  Diagnosis Date  . A-fib ([place])   . Allergic rhinitis   . Anxiety   . Asthma    pt states she "believe it is more allergies"  . Cellulitis and abscess of left leg 01/2020  . CHF (congestive heart failure) ([place])   . Cholecystitis   . Chronic abdominal pain   . Colon polyp    TCS 2005 TICS [place], TCS/PROPOFOL 2011 [facility] [place]  . Depression   . Diverticulosis  2005  . Fatty liver   . Fibromyalgia   . GERD (gastroesophageal reflux disease)   . Gout   . Hyperlipemia   . Hypothyroidism   . Internal hemorrhoids    H/o Rectal bleeding secondary to internal hemorrhoids 04/2010.  Marland Kitchen MCI (mild cognitive impairment) 12/30/2017  . Migraine   . Narcolepsy   . Obesity, morbid (more than 100 lbs over ideal weight or BMI > 40) ([facility])   . Obstructive sleep apnea on CPAP and O2  . Osteoarthritis   . Persistent atrial fibrillation ([place])    a. s/p DCCV 01/2012  . Volume overload    uses torsemide PRN    Outpatient Medications Prior to Visit  Medication Sig Dispense Refill  . Cholecalciferol (D3) 50 MCG  (2000 UT) TABS Take by mouth.    . colchicine 0.6 MG tablet as needed.    Marland Kitchen ELIQUIS 5 MG TABS tablet TAKE 1 TABLET TWICE A DAY 180 tablet 3  . esomeprazole (NEXIUM) 40 MG capsule Take 40 mg by mouth every evening.    . fluticasone (FLONASE) 50 MCG/ACT nasal spray Place into the nose as needed.    . furosemide (LASIX) 40 MG tablet Take 1 tablet (40 mg total) by mouth daily. 30 tablet 0  . HYDROcodone-acetaminophen (NORCO/VICODIN) 5-325 MG tablet     . levothyroxine (SYNTHROID, LEVOTHROID) 50 MCG tablet Take 50 mcg by mouth daily before breakfast.    . loratadine (CLARITIN) 10 MG tablet Take 10 mg by mouth daily.    Marland Kitchen LORazepam (ATIVAN) 1 MG tablet Take 1.5 mg by mouth 3 (three) times daily.    . metoprolol succinate (TOPROL-XL) 100 MG 24 hr tablet TAKE 1 TABLET DAILY. TAKE WITH OR IMMEDIATELY FOLLOWING A MEAL. 90 tablet 3  . miconazole (MICOTIN) 2 % cream Apply 1 application topically as needed.    . nystatin-triamcinolone ointment (MYCOLOG) Apply 1 application topically as needed.    Vladimir Faster Glycol-Propyl Glycol (SYSTANE OP) Apply 2 drops to eye as needed. In both eyes    .       . metoprolol tartrate (LOPRESSOR) 50 MG tablet Take 1 tablet (50 mg total) by mouth once for 1 dose. Take 50 mg the night before and the morning of CTA. 180 tablet 3  . acetaminophen (TYLENOL) 325 MG tablet Take 650 mg by mouth every 6 (six) hours as needed.     No facility-administered medications prior to visit.     Objective:     BP 96/60 (BP Location: Left Arm, Cuff Size: Large)   Pulse 79   Temp (!) 97.4 F (36.3 C) (Temporal)   Ht 5\' 2"  (1.575 m)   Wt 291 lb (132 kg)   SpO2 97% Comment: on RA  BMI 53.22 kg/m   SpO2: 97 % (on RA)   W/c bound elderly massively obese  wf nad   HEENT : pt wearing mask not removed for exam due to covid -19 concerns.    NECK :  without JVD/Nodes/TM/ nl carotid upstrokes bilaterally   LUNGS: no acc muscle use,  Nl contour chest which is clear to A and P  bilaterally without cough on insp or exp maneuvers   CV:  RRR  no s3 or murmur or increase in P2, and no edema   ABD:  Massively obese soft and nontender with nl inspiratory excursion in the supine position. No bruits or organomegaly appreciated, bowel sounds nl  MS:   Ext warm without deformities, calf tenderness, cyanosis or  clubbing No obvious joint restrictions   SKIN: warm and dry without lesions    NEURO:  alert, approp, nl sensorium with  no motor or cerebellar deficits apparent.       I personally reviewed images and  impression as follows:   Chest CTa 03/20/21  Pulmonary arteries: No pulmonary emboli.  Thoracic aorta: No aneurysm or dissection.  Mediastinal and hilar structures: There is a superior mediastinal lymph node a pretracheal position on image 35 of series 4 measuring 1.3 cm in minimal diameter which was present on 01/24/2021.  Lungs: There are mild emphysematous changes of the lungs.      Assessment   DOE (dyspnea on exertion) Onset 2015 Echo 01/12/14 - Left ventricle: The cavity size was mildly dilated. Wall thickness was increased in a pattern of mild LVH. Systolic function was normal. The estimated ejection fraction was in the range of 55% to 60%. Images were inadequate for LV wall motion assessment. Features are consistent with a pseudonormal left ventricular filling pattern, with concomitant abnormal relaxation and increased filling pressure (grade 2 diastolic dysfunction). Doppler parameters are consistent with high ventricular filling pressure. - Left atrium: The atrium was severely dilated. - Right ventricle: The cavity size was mildly dilated. Systolic function was normal. - Right atrium: The atrium was moderately dilated. - Tricuspid valve: Mild regurgitation. - Systemic veins: IVC was mildly dilated with normal respirophasic variation; estimated CVP 8 mmHg CTa 03/20/21  Neg pe/ mild emphysema - 05/01/2021  After extensive coaching inhaler  device,  effectiveness =    90% with smi > trial of stiolto x 2 weeks, not to refill if doesn't help   Clinically her problem is purely restritive (from [place] ) and/or related to afib with G 2 diastolic dysfunction as reflected by severe LAE previously but reasonable to do empirical trial for copd with daily, not prn, LAMA combined with LABA per ATS recs but no need to fill rx if 2 weeks sample doesn't help.  Needs sleep medicine coordinated in [place] where here pain management doctors reside and she plans to live going forward (had sleep medicine in [place] which is even less praactical) and f/u here prn with PFTs if not any better on stiolto though I told her this would likely be very low yield.    OBESITY, MORBID . Body mass index is 53.22 kg/m.  -  trending up  Lab Results  Component Value Date   TSH 2.305 11/06/2010     Contributing to gerd risk/ doe/reviewed the need and the process to achieve and maintain neg calorie balance > defer f/u primary care including intermittently monitoring thyroid status            Each maintenance medication was reviewed in detail including emphasizing most importantly the difference between maintenance and prns and under what circumstances the prns are to be triggered using an action plan format where appropriate.  Total time for H and P, chart review, counseling, reviewing smi  device(s) and generating customized AVS unique to this office visit / same day charting = 88min          Christinia Gully, MD 05/01/2021

## 2021-05-01 NOTE — Assessment & Plan Note (Signed)
.   Body mass index is 53.22 kg/m.  -  trending up  Lab Results  Component Value Date   TSH 2.305 11/06/2010     Contributing to gerd risk/ doe/reviewed the need and the process to achieve and maintain neg calorie balance > defer f/u primary care including intermittently monitoring thyroid status            Each maintenance medication was reviewed in detail including emphasizing most importantly the difference between maintenance and prns and under what circumstances the prns are to be triggered using an action plan format where appropriate.  Total time for H and P, chart review, counseling, reviewing smi  device(s) and generating customized AVS unique to this office visit / same day charting = 48min

## 2021-05-03 ENCOUNTER — Other Ambulatory Visit: Payer: Self-pay

## 2021-05-03 ENCOUNTER — Ambulatory Visit
Admission: RE | Admit: 2021-05-03 | Discharge: 2021-05-03 | Disposition: A | Discharge status: Home / Self Care | Payer: Medicare Other | Source: Ambulatory Visit | Attending: Family Medicine | Admitting: Family Medicine

## 2021-05-03 DIAGNOSIS — Z1231 Encounter for screening mammogram for malignant neoplasm of breast: Secondary | ICD-10-CM

## 2021-05-05 ENCOUNTER — Telehealth: Payer: Self-pay

## 2021-05-05 ENCOUNTER — Other Ambulatory Visit: Payer: Self-pay

## 2021-05-05 ENCOUNTER — Ambulatory Visit: Payer: Medicare Other | Admitting: Student

## 2021-05-05 MED ORDER — FUROSEMIDE 40 MG PO TABS: 40.0000 mg | ORAL_TABLET | Freq: Every day | ORAL | 0 refills | Status: AC

## 2021-05-05 NOTE — Telephone Encounter (Signed)
done

## 2021-05-17 DIAGNOSIS — Z6841 Body Mass Index (BMI) 40.0 and over, adult: Secondary | ICD-10-CM | POA: Diagnosis not present

## 2021-05-17 DIAGNOSIS — I4811 Longstanding persistent atrial fibrillation: Secondary | ICD-10-CM | POA: Diagnosis not present

## 2021-05-17 DIAGNOSIS — Z9989 Dependence on other enabling machines and devices: Secondary | ICD-10-CM | POA: Diagnosis not present

## 2021-05-17 DIAGNOSIS — G4733 Obstructive sleep apnea (adult) (pediatric): Secondary | ICD-10-CM | POA: Diagnosis not present

## 2021-05-17 DIAGNOSIS — I5032 Chronic diastolic (congestive) heart failure: Secondary | ICD-10-CM | POA: Diagnosis not present

## 2021-05-19 ENCOUNTER — Ambulatory Visit: Payer: Medicare Other | Admitting: Cardiology

## 2021-06-09 DIAGNOSIS — M47816 Spondylosis without myelopathy or radiculopathy, lumbar region: Secondary | ICD-10-CM | POA: Diagnosis not present

## 2021-06-09 DIAGNOSIS — G894 Chronic pain syndrome: Secondary | ICD-10-CM | POA: Diagnosis not present

## 2021-06-09 DIAGNOSIS — M5136 Other intervertebral disc degeneration, lumbar region: Secondary | ICD-10-CM | POA: Diagnosis not present

## 2021-06-09 DIAGNOSIS — Z5181 Encounter for therapeutic drug level monitoring: Secondary | ICD-10-CM | POA: Diagnosis not present

## 2021-06-09 DIAGNOSIS — Z79891 Long term (current) use of opiate analgesic: Secondary | ICD-10-CM | POA: Diagnosis not present

## 2021-06-12 DIAGNOSIS — R140 Abdominal distension (gaseous): Secondary | ICD-10-CM | POA: Diagnosis not present

## 2021-06-12 DIAGNOSIS — R1011 Right upper quadrant pain: Secondary | ICD-10-CM | POA: Diagnosis not present

## 2021-06-12 DIAGNOSIS — K5904 Chronic idiopathic constipation: Secondary | ICD-10-CM | POA: Diagnosis not present

## 2021-06-23 DIAGNOSIS — K219 Gastro-esophageal reflux disease without esophagitis: Secondary | ICD-10-CM | POA: Diagnosis not present

## 2021-06-23 DIAGNOSIS — R0982 Postnasal drip: Secondary | ICD-10-CM | POA: Diagnosis not present

## 2021-06-23 DIAGNOSIS — J309 Allergic rhinitis, unspecified: Secondary | ICD-10-CM | POA: Diagnosis not present

## 2021-07-13 ENCOUNTER — Ambulatory Visit: Payer: Medicare Other | Admitting: Student

## 2021-07-13 ENCOUNTER — Other Ambulatory Visit: Payer: Self-pay

## 2021-07-13 ENCOUNTER — Encounter: Payer: Self-pay | Admitting: Student

## 2021-07-13 VITALS — BP 111/70 | HR 59 | Temp 98.4°F | Resp 17 | Ht 62.0 in | Wt 288.0 lb

## 2021-07-13 DIAGNOSIS — E7800 Pure hypercholesterolemia, unspecified: Secondary | ICD-10-CM

## 2021-07-13 DIAGNOSIS — I4821 Permanent atrial fibrillation: Secondary | ICD-10-CM | POA: Diagnosis not present

## 2021-07-13 DIAGNOSIS — R9439 Abnormal result of other cardiovascular function study: Secondary | ICD-10-CM | POA: Diagnosis not present

## 2021-07-13 DIAGNOSIS — I5032 Chronic diastolic (congestive) heart failure: Secondary | ICD-10-CM

## 2021-07-13 NOTE — Progress Notes (Signed)
Primary Physician/Referring:  Denny Levy, PA  Patient ID: [name], female    DOB: 05/17/1941, 80 y.o.   MRN: [id number]  Chief Complaint  Patient presents with   Shortness of Breath    3 month   HPI:    [name]  is a 80 y.o. Caucasian female with  permanent atrial fibrillation, morbid obesity, depression, chronic diastolic CHF, hypertension, frequency and urgency of urination that is chronic, very sensitive to medications.  Patient presents for 30-monthfollow-up.  Since last office visit she reports shortness of breath has significantly improved.  She continues to have dyspnea on exertion, but this is also improving as she slowly increase his physical activity.  She continues to follow for management of obstructive sleep apnea and has been seen by pulmonology since last visit.  Dr. Work (pulmonology) feels patient's dyspnea is significantly related to restrictive pattern from morbid obesity.  She has also been evaluated by gastroenterology for abdominal pain.  Overall patient feels the best she has in a while, with improved energy and dyspnea.  She does continue to complain of bilateral lower extremity neuropathy.  Denies chest pain, dizziness, orthopnea, PND, palpitations.  Past Medical History:  Diagnosis Date   A-fib ([facility]    Allergic rhinitis    Anxiety    Asthma    pt states she "believe it is more allergies"   Cellulitis and abscess of left leg 01/2020   CHF (congestive heart failure) (HCC)    Cholecystitis    Chronic abdominal pain    Colon polyp    TCS 2005 TICS [place], TCS/PROPOFOL 2011 SIMPLE ADENOMAS   Depression    Diverticulosis 2005   Fatty liver    Fibromyalgia    GERD (gastroesophageal reflux disease)    Gout    Hyperlipemia    Hypothyroidism    Internal hemorrhoids    H/o Rectal bleeding secondary to internal hemorrhoids 04/2010.   MCI (mild cognitive impairment) 12/30/2017   Migraine    Narcolepsy    Obesity, morbid (more than 100 lbs over ideal  weight or BMI > 40) (HCC)    Obstructive sleep apnea on CPAP and O2   Osteoarthritis    [place]    a. s/p DCCV 01/2012   Volume overload    uses torsemide PRN   Past Surgical History:  Procedure Laterality Date   [place]  CARDIOVERSION  02/05/2012   Procedure: CARDIOVERSION;  Surgeon: JLaverda Page MD;  [place]  Service: Cardiovascular;  Laterality: N/A;   CARDIOVERSION N/A 06/09/2013   Procedure: CARDIOVERSION;  Surgeon: JLaverda Page MD;  [place]  Service: Cardiovascular;  Laterality: N/A;  h&p in file-[place]   CARDIOVERSION N/A 08/09/2015   Procedure: CARDIOVERSION;  Surgeon: JAdrian Prows MD;  [facility]  Service: Cardiovascular;  Laterality: N/A;   CARDIOVERSION N/A 09/13/2015   Procedure: CARDIOVERSION;  Surgeon: JAdrian Prows MD;  [facility]  Service: Cardiovascular;  Laterality: N/A;   CHOLECYSTECTOMY  09/03/2012   Procedure: LAPAROSCOPIC CHOLECYSTECTOMY;  Surgeon: MJamesetta So MD;  Location: [facility];  Service: General;  Laterality: N/A;   COLONOSCOPY  2005   Dr. STamala Julian sigmoid diverticulosis   COLONOSCOPY  June 2011   Dr. FOneida Alar internal hemorrhoids, simple adenomas, hyperplastic polyps, needs surveillance in June 2014 with overtube and Propofol   COLONOSCOPY WITH PROPOFOL N/A 03/19/2016   Dr. RGala Romney multiple small  tubular adenomas, diverticulosis. Surveillance in 5 years    ERCP  10/01/2012   Procedure: ENDOSCOPIC RETROGRADE CHOLANGIOPANCREATOGRAPHY (ERCP);  Surgeon: Daneil Dolin, MD;  Location: [facility];  Service: Endoscopy;  Laterality: N/A;   ESOPHAGOGASTRODUODENOSCOPY (EGD) WITH PROPOFOL N/A 03/19/2016   DR. Rourk: normal esohpagus with small hiatal hernia    Kidney Stent  1995   POLYPECTOMY  03/19/2016   Procedure: POLYPECTOMY;  Surgeon: Daneil Dolin, MD;  Location[facility];  Service: Endoscopy;;  cold snares   REMOVAL OF  STONES  10/01/2012   Procedure: REMOVAL OF STONES;  Surgeon: Daneil Dolin, MD;  Location: [facility];  Service: Endoscopy;  Laterality: N/A;   RHINOPLASTY     SPHINCTEROTOMY  10/01/2012   Procedure: SPHINCTEROTOMY;  Surgeon: Daneil Dolin, MD;  Location: [facility];  Service: Endoscopy;  Laterality: N/A;   Family History  Problem Relation Age of Onset   Colon cancer Mother 7   Diabetes Mother    Asthma Mother    Heart disease Mother    Cerebral aneurysm Father    Asthma Maternal Aunt    Asthma Brother    Heart disease Maternal Grandfather    Heart disease Maternal Grandmother    Social History   Tobacco Use   Smoking status: Former    Packs/day: 0.50    Years: 30.00    Pack years: 15.00    Types: Cigarettes    Quit date: 10/03/1989    Years since quitting: 31.8   Smokeless tobacco: Never   Tobacco comments:    Quit in 1992  Substance Use Topics   Alcohol use: No    Alcohol/week: 0.0 standard drinks    Marital Status: Widowed   ROS  Review of Systems  Cardiovascular:  Positive for dyspnea on exertion (chronic, worse over the last few weeks). Negative for chest pain, claudication, leg swelling, near-syncope, orthopnea, palpitations, paroxysmal nocturnal dyspnea and syncope.  Respiratory:  Positive for snoring (on CPAP).   Musculoskeletal:  Positive for arthritis and back pain.  Genitourinary:  Positive for bladder incontinence and frequency (chronic).  All other systems reviewed and are negative. Objective   Vitals with BMI 07/13/2021 05/01/2021 04/11/2021  Height '5\' 2"'$  '5\' 2"'$  '5\' 2"'$   Weight 288 lbs 291 lbs 282 lbs  BMI 52.66 [id number]  Systolic [id number]  Diastolic 70 60 49  Pulse 59 79 77     Physical Exam Vitals reviewed.  Constitutional:      Comments: Moderately built and Morbidly obese in no acute distress.  Neck:     Thyroid: No thyromegaly.     Comments: Short neck and difficult to evaluate JVP Cardiovascular:     Rate and Rhythm: Normal rate. Rhythm  irregular.     Pulses:          Carotid pulses are 2+ on the right side and 2+ on the left side.      Dorsalis pedis pulses are 2+ on the right side and 2+ on the left side.       Posterior tibial pulses are 2+ on the right side and 2+ on the left side.     Heart sounds: No murmur heard.   No gallop.     Comments: S1 variable and S2 normal. Distant HS. Femoral and popliteal pulse difficult to feel due to patient's body habitus.  Pulmonary:     Effort: Pulmonary effort is normal.     Breath sounds: Normal breath sounds. No wheezing, rhonchi  or rales.  Abdominal:     Comments: Obese. Pannus present  Musculoskeletal:     Right lower leg: No edema.     Left lower leg: No edema.     Comments: Bilateral lower legs large adipose tissue noted.  Neurological:     Mental Status: She is alert.   Laboratory examination:    CMP Latest Ref Rng & Units 04/10/2021 10/01/2019 09/23/2019  Glucose 65 - 99 mg/dL 77 112(H) 109(H)  BUN 8 - 27 mg/dL '16 19 19  '$ Creatinine 0.57 - 1.00 mg/dL 0.84 1.06(H) 1.00  Sodium 134 - 144 mmol/L 143 139 140  Potassium 3.5 - 5.2 mmol/L 4.2 4.1 3.8  Chloride 96 - 106 mmol/L 101 100 99  CO2 20 - 29 mmol/L '27 27 31  '$ Calcium 8.7 - 10.3 mg/dL 9.9 9.6 9.9  Total Protein 6.5 - 8.1 g/dL - - 7.4  Total Bilirubin 0.3 - 1.2 mg/dL - - 0.5  Alkaline Phos 38 - 126 U/L - - 81  AST 15 - 41 U/L - - 28  ALT 0 - 44 U/L - - 30   CBC Latest Ref Rng & Units 09/23/2019 12/29/2018 02/16/2018  WBC 4.0 - 10.5 K/uL 7.7 7.0 6.6  Hemoglobin 12.0 - 15.0 g/dL 14.8 14.6 13.9  Hematocrit 36.0 - 46.0 % 47.3(H) 45.8 43.4  Platelets 150 - 400 K/uL 261 257 196   Lipid Panel     Component Value Date/Time   CHOL 170 07/22/2009 0127   TRIG 129 07/22/2009 0127   HDL 48 07/22/2009 0127   CHOLHDL 3.5 Ratio 07/22/2009 0127   VLDL 26 07/22/2009 0127   LDLCALC 96 07/22/2009 0127   BNP    Component Value Date/Time   BNP 73.2 10/01/2019 1059   BNP 74.5 09/23/2019 1328    HEMOGLOBIN A1C Lab  Results  Component Value Date   HGBA1C 6.3 (H) 09/02/2012   MPG 134 (H) 09/02/2012   TSH No results for input(s): TSH in the last 8760 hours.   External labs: 03/20/2021: Hemoglobin 13.4, hematocrit 41.6, MCV 90, platelet 203 Sodium 139, potassium 4.6, glucose 109, BUN 16, creatinine 0.89, GFR 66 Troponin 8-8 Magnesium 2.1 proBNP 600  01/26/2021: Sodium 141, potassium 3.8, glucose 108, BUN 17, creatinine 0.95, GFR 61 Hemoglobin 14.2, hematocrit 44.6, MCV 90, platelet 223 NT proBNP 891 Allergies   Allergies  Allergen Reactions   Meperidine Rash and Other (See Comments)    Reaction:  Bottoms out BP Other reaction(s): other/intolerance Hypotension    Atorvastatin Other (See Comments)    Reaction:  Muscle pain    Febuxostat Other (See Comments)    Reaction:  Visual changes  Other reaction(s): neurological reaction Visual changes Reaction:  Visual changes    Simvastatin     Other reaction(s): other/intolerance Elevated liver enzymes    Statins     Other reaction(s): muscle/joint pain   Zocor [Simvastatin - High Dose] Other (See Comments)    Reaction:  Elevated liver enzymes      Medications Prior to Visit:   Outpatient Medications Prior to Visit  Medication Sig Dispense Refill   amitriptyline (ELAVIL) 25 MG tablet Take 25 mg by mouth at bedtime.     Cholecalciferol (D3) 50 MCG (2000 UT) TABS Take by mouth.     colchicine 0.6 MG tablet as needed.     ELIQUIS 5 MG TABS tablet TAKE 1 TABLET TWICE A DAY 180 tablet 3   esomeprazole (NEXIUM) 40 MG capsule Take 40 mg by mouth every  evening.     fluticasone (FLONASE) 50 MCG/ACT nasal spray Place into the nose as needed.     furosemide (LASIX) 40 MG tablet Take 1 tablet (40 mg total) by mouth daily. 30 tablet 0   HYDROcodone-acetaminophen (NORCO/VICODIN) 5-325 MG tablet      levothyroxine (SYNTHROID, LEVOTHROID) 50 MCG tablet Take 50 mcg by mouth daily before breakfast.     loratadine (CLARITIN) 10 MG tablet Take 10 mg by  mouth daily.     LORazepam (ATIVAN) 1 MG tablet Take 1.5 mg by mouth 3 (three) times daily.     metoprolol succinate (TOPROL-XL) 100 MG 24 hr tablet TAKE 1 TABLET DAILY. TAKE WITH OR IMMEDIATELY FOLLOWING A MEAL. 90 tablet 3   miconazole (MICOTIN) 2 % cream Apply 1 application topically as needed.     nystatin-triamcinolone ointment (MYCOLOG) Apply 1 application topically as needed.     Polyethyl Glycol-Propyl Glycol (SYSTANE OP) Apply 2 drops to eye as needed. In both eyes     Sennosides-Docusate Sodium (STOOL SOFTENER/LAXATIVE PO) Take 50 mg by mouth as needed.     Tiotropium Bromide-Olodaterol (STIOLTO RESPIMAT) 2.5-2.5 MCG/ACT AERS Inhale 2 puffs into the lungs daily. 4 g 11   metoprolol tartrate (LOPRESSOR) 50 MG tablet Take 1 tablet (50 mg total) by mouth once for 1 dose. Take 50 mg the night before and the morning of CTA. 180 tablet 3   Tiotropium Bromide-Olodaterol (STIOLTO RESPIMAT) 2.5-2.5 MCG/ACT AERS Inhale 2 puffs into the lungs daily. 4 g 0   No facility-administered medications prior to visit.     Final Medications at End of Visit    Current Meds  Medication Sig   amitriptyline (ELAVIL) 25 MG tablet Take 25 mg by mouth at bedtime.   Cholecalciferol (D3) 50 MCG (2000 UT) TABS Take by mouth.   colchicine 0.6 MG tablet as needed.   ELIQUIS 5 MG TABS tablet TAKE 1 TABLET TWICE A DAY   esomeprazole (NEXIUM) 40 MG capsule Take 40 mg by mouth every evening.   fluticasone (FLONASE) 50 MCG/ACT nasal spray Place into the nose as needed.   furosemide (LASIX) 40 MG tablet Take 1 tablet (40 mg total) by mouth daily.   HYDROcodone-acetaminophen (NORCO/VICODIN) 5-325 MG tablet    levothyroxine (SYNTHROID, LEVOTHROID) 50 MCG tablet Take 50 mcg by mouth daily before breakfast.   loratadine (CLARITIN) 10 MG tablet Take 10 mg by mouth daily.   LORazepam (ATIVAN) 1 MG tablet Take 1.5 mg by mouth 3 (three) times daily.   metoprolol succinate (TOPROL-XL) 100 MG 24 hr tablet TAKE 1 TABLET  DAILY. TAKE WITH OR IMMEDIATELY FOLLOWING A MEAL.   miconazole (MICOTIN) 2 % cream Apply 1 application topically as needed.   nystatin-triamcinolone ointment (MYCOLOG) Apply 1 application topically as needed.   Polyethyl Glycol-Propyl Glycol (SYSTANE OP) Apply 2 drops to eye as needed. In both eyes   Sennosides-Docusate Sodium (STOOL SOFTENER/LAXATIVE PO) Take 50 mg by mouth as needed.   Tiotropium Bromide-Olodaterol (STIOLTO RESPIMAT) 2.5-2.5 MCG/ACT AERS Inhale 2 puffs into the lungs daily.   Radiology:  No results found. CT angio 03/20/2021: No evidence of pulmonary emboli. No acute infiltrate or pleural effusion.  Mild emphysematous changes.  Stable pretracheal lymph node.  Stable pneumobilia   Chest x-ray 03/20/2021: Stable heart size and mediastinal contours. No focal infiltrate/consolidative changes. No pleural effusion or pneumothorax.  Cardiac Studies:   PCV MYOCARDIAL PERFUSION WITH LEXISCAN 04/03/2021 Lexiscan nuclear stress test performed using 1-day protocol. Absent tracer uptake in entire inferior  myocardium likely due to large breast tissue attenuation. Imaging performed in sitting position. BMI 51. Ischemia in this region cannot be excluded.  Stress LVEF 71%. Intermediate risk study. Clinical correlation recommended.  CT angio pulmonary 01/24/2021: No acute pulmonary findings, negative for PE  Chest x-ray 01/24/2021: Bibasilar opacities may represent atelectasis or infiltrates  Echocardiogram 01/10/2021:  1. Left ventricular ejection fraction, by estimation, is 60 to 65%. The left ventricle has normal function. The left ventricle has no regional wall motion abnormalities. Left ventricular diastolic function could not be evaluated.   2. Right ventricular systolic function is normal. The right ventricular size is normal. There is normal pulmonary artery systolic pressure.   3. Left atrial size was moderately dilated.   4. Right atrial size was mild to moderately dilated.    5. The mitral valve was not well visualized. No evidence of mitral valve regurgitation. No evidence of mitral stenosis.   6. The aortic valve is normal in structure. Aortic valve regurgitation is not visualized.   Echocardiogram 10/09/2019: Left ventricle cavity is normal in size. Moderate concentric hypertrophy of the left ventricle. Mild global hypokinesis. EF 45-50%. Unable to evaluate diastolic function due to atrial fibrillation.  Left atrial cavity is severely dilated. Moderate (Grade III) mitral regurgitation. Mild to moderate tricuspid regurgitation. Estimated pulmonary artery systolic pressure is 28 mmHg.   Compared to previous study on 09/20/2018, EF is reduced from 50-55% to 45-50%.  Lexiscan myoview stress test 03/29/2017: Two day protocol followed due to patient's bodily habitus. 1. The resting electrocardiogram demonstrated atrial fibrillation and normal rest repolarization, low voltage complexes.  Stress EKG is non-diagnostic for ischemia as it a pharmacologic stress using Lexiscan. 2. REST and STRESS images demonstrate decreased tracer uptake in the basal inferoseptal, basal inferior, mid inferoseptal and apical segments of the left ventricle.  These defects are related to breast attenuation. Mild ischemia in this region cannot be completely excluded.  Gated SPECT images reveal normal myocardial thickening and wall motion.  The left ventricular ejection fraction was calculated or visually estimated to be 60%.  This is a low risk study, clinical correlation recommended in view of patient's bodily habitus.  EKG   EKG 03/28/2021: Atrial fibrillation with controlled ventricular response at a rate of 82 bpm.  Left axis. Poor R wave progression, cannot exclude anteroseptal infarct old.  Old inferior infarct.  Nonspecific T wave abnormality.  Low voltage complexes, consider pulmonary disease pattern.  Compared to EKG 02/02/2021, no significant change.  EKG 01/24/2021 (external): Atrial  fibrillation with controlled ventricular response at a rate of 73 bpm.  Low voltage complexes.  Assessment     ICD-10-CM   1. Chronic diastolic (congestive) heart failure (HCC)  I50.32     2. Abnormal stress test  R94.39 Lipid Panel With LDL/HDL Ratio    3. Permanent atrial fibrillation (HCC)  I48.21     4. Morbid obesity due to excess calories (HCC)  E66.01       CHA2DS2-VASc Score is 4.  Yearly risk of stroke: 4% (A, F, HTN, CHF).   No orders of the defined types were placed in this encounter.  Medications Discontinued During This Encounter  Medication Reason   metoprolol tartrate (LOPRESSOR) 50 MG tablet Error   Tiotropium Bromide-Olodaterol (STIOLTO RESPIMAT) 2.5-2.5 MCG/ACT AERS Error    Recommendations:   [name]  is a 80 y.o. Caucasian female with  permanent atrial fibrillation, morbid obesity, depression, chronic diastolic CHF, hypertension, frequency and urgency of urination that is chronic,  very sensitive to medications.  Patient presents for 55-monthfollow-up.  Her dyspnea has significantly improved since last visit and patient has more energy than she has in quite a while.  At last visit had ordered coronary CTA, as body habitus limited nuclear imaging during stress test.  However as patient symptoms have improved she has not had this done yet.  Shared decision was to proceed with watchful waiting, will hold off on coronary CTA at this time.  There is no clinical evidence of acute decompensated heart failure.  We will continue present medications including metoprolol and Eliquis as she is tolerating this without bleeding diathesis.  Blood pressure is well controlled.  Patient is due for repeat lipid profile testing, will obtain this.  She is otherwise relatively stable from a cardiovascular standpoint.  Again discussed at length with patient the importance of diet modification and increased physical activity, reiterated the need to lose weight.  Follow-up in 6  months, sooner if needed, for HFpEF, hypertension, and atrial fibrillation.   CAlethia Berthold PA-C 07/14/2021, 2:56 PM Office: [phone]

## 2021-07-14 ENCOUNTER — Ambulatory Visit: Payer: Medicare Other | Admitting: Student

## 2021-08-07 DIAGNOSIS — K5904 Chronic idiopathic constipation: Secondary | ICD-10-CM | POA: Diagnosis not present

## 2021-08-07 DIAGNOSIS — G8929 Other chronic pain: Secondary | ICD-10-CM | POA: Diagnosis not present

## 2021-08-07 DIAGNOSIS — R1011 Right upper quadrant pain: Secondary | ICD-10-CM | POA: Diagnosis not present

## 2021-08-11 DIAGNOSIS — R9439 Abnormal result of other cardiovascular function study: Secondary | ICD-10-CM | POA: Diagnosis not present

## 2021-08-12 LAB — LIPID PANEL WITH LDL/HDL RATIO
Cholesterol, Total: 201 mg/dL — ABNORMAL HIGH (ref 100–199)
HDL: 35 mg/dL — ABNORMAL LOW (ref >39)
LDL Chol Calc (NIH): 138 mg/dL — ABNORMAL HIGH (ref 0–99)
LDL/HDL Ratio: 3.9 ratio — ABNORMAL HIGH (ref 0.0–3.2)
Triglycerides: 156 mg/dL — ABNORMAL HIGH (ref 0–149)
VLDL Cholesterol Cal: 28 mg/dL (ref 5–40)

## 2021-08-12 LAB — ABN OPTION 2

## 2021-08-15 MED ORDER — EZETIMIBE 10 MG PO TABS: 10.0000 mg | ORAL_TABLET | Freq: Every day | ORAL | 3 refills | Status: DC

## 2021-08-15 NOTE — Addendum Note (Signed)
Addended by: Lawerance Cruel L on: 08/15/2021 10:14 AM   Modules accepted: Orders

## 2021-08-15 NOTE — Progress Notes (Signed)
Called pt to inform her about her results. Pt understand and I welling to try Zetia, she would like for you to send it to [facility]

## 2021-08-23 DIAGNOSIS — G4733 Obstructive sleep apnea (adult) (pediatric): Secondary | ICD-10-CM | POA: Diagnosis not present

## 2021-08-23 DIAGNOSIS — I5032 Chronic diastolic (congestive) heart failure: Secondary | ICD-10-CM | POA: Diagnosis not present

## 2021-08-23 DIAGNOSIS — I4811 Longstanding persistent atrial fibrillation: Secondary | ICD-10-CM | POA: Diagnosis not present

## 2021-08-23 DIAGNOSIS — Z9989 Dependence on other enabling machines and devices: Secondary | ICD-10-CM | POA: Diagnosis not present

## 2021-08-23 DIAGNOSIS — Z6841 Body Mass Index (BMI) 40.0 and over, adult: Secondary | ICD-10-CM | POA: Diagnosis not present

## 2021-09-01 DIAGNOSIS — I10 Essential (primary) hypertension: Secondary | ICD-10-CM | POA: Diagnosis not present

## 2021-09-01 DIAGNOSIS — I482 Chronic atrial fibrillation, unspecified: Secondary | ICD-10-CM | POA: Diagnosis not present

## 2021-09-01 DIAGNOSIS — R0602 Shortness of breath: Secondary | ICD-10-CM | POA: Diagnosis not present

## 2021-09-01 DIAGNOSIS — I509 Heart failure, unspecified: Secondary | ICD-10-CM | POA: Diagnosis not present

## 2021-09-01 DIAGNOSIS — Z6841 Body Mass Index (BMI) 40.0 and over, adult: Secondary | ICD-10-CM | POA: Diagnosis not present

## 2021-09-01 DIAGNOSIS — J309 Allergic rhinitis, unspecified: Secondary | ICD-10-CM | POA: Diagnosis not present

## 2021-09-01 DIAGNOSIS — N3946 Mixed incontinence: Secondary | ICD-10-CM | POA: Diagnosis not present

## 2021-09-11 DIAGNOSIS — G894 Chronic pain syndrome: Secondary | ICD-10-CM | POA: Diagnosis not present

## 2021-09-11 DIAGNOSIS — Z6841 Body Mass Index (BMI) 40.0 and over, adult: Secondary | ICD-10-CM | POA: Diagnosis not present

## 2021-09-11 DIAGNOSIS — M47816 Spondylosis without myelopathy or radiculopathy, lumbar region: Secondary | ICD-10-CM | POA: Diagnosis not present

## 2021-09-11 DIAGNOSIS — M5136 Other intervertebral disc degeneration, lumbar region: Secondary | ICD-10-CM | POA: Diagnosis not present

## 2021-09-16 ENCOUNTER — Other Ambulatory Visit: Payer: Self-pay | Admitting: Cardiology

## 2021-09-16 DIAGNOSIS — I4819 Other persistent atrial fibrillation: Secondary | ICD-10-CM

## 2021-09-29 DIAGNOSIS — G8929 Other chronic pain: Secondary | ICD-10-CM | POA: Diagnosis not present

## 2021-09-29 DIAGNOSIS — K5904 Chronic idiopathic constipation: Secondary | ICD-10-CM | POA: Diagnosis not present

## 2021-09-29 DIAGNOSIS — R1011 Right upper quadrant pain: Secondary | ICD-10-CM | POA: Diagnosis not present

## 2021-12-01 DIAGNOSIS — I4811 Longstanding persistent atrial fibrillation: Secondary | ICD-10-CM | POA: Diagnosis not present

## 2021-12-01 DIAGNOSIS — Z6841 Body Mass Index (BMI) 40.0 and over, adult: Secondary | ICD-10-CM | POA: Diagnosis not present

## 2021-12-01 DIAGNOSIS — G4733 Obstructive sleep apnea (adult) (pediatric): Secondary | ICD-10-CM | POA: Diagnosis not present

## 2021-12-01 DIAGNOSIS — Z9989 Dependence on other enabling machines and devices: Secondary | ICD-10-CM | POA: Diagnosis not present

## 2021-12-01 DIAGNOSIS — I5032 Chronic diastolic (congestive) heart failure: Secondary | ICD-10-CM | POA: Diagnosis not present

## 2021-12-15 DIAGNOSIS — R29898 Other symptoms and signs involving the musculoskeletal system: Secondary | ICD-10-CM | POA: Diagnosis not present

## 2021-12-15 DIAGNOSIS — M5136 Other intervertebral disc degeneration, lumbar region: Secondary | ICD-10-CM | POA: Diagnosis not present

## 2021-12-15 DIAGNOSIS — Z79891 Long term (current) use of opiate analgesic: Secondary | ICD-10-CM | POA: Diagnosis not present

## 2021-12-15 DIAGNOSIS — M47816 Spondylosis without myelopathy or radiculopathy, lumbar region: Secondary | ICD-10-CM | POA: Diagnosis not present

## 2021-12-15 DIAGNOSIS — Z6841 Body Mass Index (BMI) 40.0 and over, adult: Secondary | ICD-10-CM | POA: Diagnosis not present

## 2021-12-15 DIAGNOSIS — G894 Chronic pain syndrome: Secondary | ICD-10-CM | POA: Diagnosis not present

## 2022-01-15 ENCOUNTER — Other Ambulatory Visit: Payer: Self-pay

## 2022-01-15 ENCOUNTER — Ambulatory Visit: Payer: Medicare Other | Admitting: Student

## 2022-01-15 ENCOUNTER — Encounter: Payer: Self-pay | Admitting: Student

## 2022-01-15 VITALS — BP 137/84 | HR 80 | Temp 97.9°F | Resp 17 | Ht 62.0 in | Wt 300.0 lb

## 2022-01-15 DIAGNOSIS — I5032 Chronic diastolic (congestive) heart failure: Secondary | ICD-10-CM

## 2022-01-15 DIAGNOSIS — I4821 Permanent atrial fibrillation: Secondary | ICD-10-CM

## 2022-01-15 DIAGNOSIS — E7800 Pure hypercholesterolemia, unspecified: Secondary | ICD-10-CM | POA: Diagnosis not present

## 2022-01-15 MED ORDER — EZETIMIBE 10 MG PO TABS: 10.0000 mg | ORAL_TABLET | Freq: Every day | ORAL | 3 refills | Status: AC

## 2022-01-15 NOTE — Progress Notes (Signed)
Primary Physician/Referring:  Denny Levy, PA  Patient ID: [name], female    DOB: 04-Aug-1941, 81 y.o.   MRN: [id number]  Chief Complaint  Patient presents with   HFpEF   Atrial Fibrillation    6 MONTH   HPI:    [name]  is a 81 y.o. Caucasian female with  permanent atrial fibrillation, morbid obesity, depression, chronic diastolic CHF, hypertension, frequency and urgency of urination that is chronic, very sensitive to medications.  Patient was last seen in our office 07/13/2021 at which time shortness of breath had significantly improved, therefore hold off on further cardiac evaluation.  Patient has also been evaluated by pulmonology who feels shortness of breath is most likely related to restrictive pattern given morbid obesity.  Patient's primary concern today is she has noticed worsening dyspnea on exertion over the last 3 to 4 months compared to last office visit.  Denies chest pain, orthopnea, PND.  Patient has significant adipose tissue in her lower legs, but only minimal pitting edema.  Notably following last office visit had recommended initiation of Zetia, patient took this for a month however she did not get it refilled.  Past Medical History:  Diagnosis Date   A-fib [facility])    Allergic rhinitis    Anxiety    Asthma    pt states she "believe it is more allergies"   Cellulitis and abscess of left leg 01/2020   CHF (congestive heart failure) (HCC)    Cholecystitis    Chronic abdominal pain    Colon polyp    TCS 2005 TICS [place], TCS/PROPOFOL 2011 SIMPLE [facility]ENOM[facility]   Depression    Diverticulosis 2005   Fatty liver    Fibromyalgia    GERD (gastroesophageal reflux disease)    Gout    Hyperlipemia    Hypothyroidism    Internal hemorrhoids    H/o Rectal bleeding secondary to internal hemorrhoids 04/2010.   MCI (mild cognitive impairment) 12/30/2017   Migraine    Narcolepsy    Obesity, morbid (more than 100 lbs over ideal weight or BMI > 40) (HCC)    Obstructive  sleep apnea on CPAP and O2   Osteoarthritis    Persistent atrial fibrillation ([place])    a. s/p DCCV 01/2012   Volume overload    uses torsemide PRN   Past Surgical History:  Procedure Laterality Date   [place]   CARDIOVERSION  02/05/2012   Procedure: CARDIOVERSION;  Surgeon: Laverda Page, MD;  Location: [place];  Service: Cardiovascular;  Laterality: N/A;   CARDIOVERSION N/A 06/09/2013   Procedure: CARDIOVERSION;  Surgeon: Laverda Page, MD;  Location: [facility];  Service: Cardiovascular;  Laterality: N/A;  h&p in file-[place]   CARDIOVERSION N/A 08/09/2015   Procedure: CARDIOVERSION;  Surgeon: Adrian Prows, MD;  Location: [facility] ENDOSCOPY;  Service: Cardiovascular;  Laterality: N/A;   CARDIOVERSION N/A 09/13/2015   Procedure: CARDIOVERSION;  Surgeon: Adrian Prows, MD;  Location: [facility] ENDOSCOPY;  Service: Cardiovascular;  Laterality: N/A;   CHOLECYSTECTOMY  09/03/2012   Procedure: LAPAROSCOPIC CHOLECYSTECTOMY;  Surgeon: Jamesetta So, MD;  Location: [facility];  Service: General;  Laterality: N/A;   COLONOSCOPY  2005   Dr. Tamala Julian: sigmoid diverticulosis   COLONOSCOPY  June 2011   Dr. Oneida Alar: internal hemorrhoids, simple adenomas, hyperplastic polyps, needs surveillance in June 2014 with overtube and Propofol   COLONOSCOPY WITH PROPOFOL N/A 03/19/2016   Dr. Gala Romney: multiple small tubular  adenomas, diverticulosis. Surveillance in 5 years    ERCP  10/01/2012   Procedure: ENDOSCOPIC RETROGRADE CHOLANGIOPANCREATOGRAPHY (ERCP);  Surgeon: Daneil Dolin, MD;  Location: [facility];  Service: Endoscopy;  Laterality: N/A;   ESOPHAGOGASTRODUODENOSCOPY (EGD) WITH PROPOFOL N/A 03/19/2016   DR. Rourk: normal esohpagus with small hiatal hernia    Kidney Stent  1995   POLYPECTOMY  03/19/2016   Procedure: POLYPECTOMY;  Surgeon: Daneil Dolin, MD;  Location: [facility];  Service: Endoscopy;;  cold snares   REMOVAL OF STONES  10/01/2012   Procedure: REMOVAL OF  STONES;  Surgeon: Daneil Dolin, MD;  Location: [facility];  Service: Endoscopy;  Laterality: N/A;   RHINOPLASTY     SPHINCTEROTOMY  10/01/2012   Procedure: SPHINCTEROTOMY;  Surgeon: Daneil Dolin, MD;  Location: [facility];  Service: Endoscopy;  Laterality: N/A;   Family History  Problem Relation Age of Onset   Colon cancer Mother 59   Diabetes Mother    Asthma Mother    Heart disease Mother    Cerebral aneurysm Father    Asthma Maternal Aunt    Asthma Brother    Heart disease Maternal Grandfather    Heart disease Maternal Grandmother    Social History   Tobacco Use   Smoking status: Former    Packs/day: 0.50    Years: 30.00    Pack years: 15.00    Types: Cigarettes    Quit date: 10/03/1989    Years since quitting: 32.3   Smokeless tobacco: Never   Tobacco comments:    Quit in 1992  Substance Use Topics   Alcohol use: No    Alcohol/week: 0.0 standard drinks    Marital Status: Widowed   ROS  Review of Systems  Cardiovascular:  Positive for dyspnea on exertion (chronic, worse over the last 3 months). Negative for chest pain, claudication, leg swelling, near-syncope, orthopnea, palpitations, paroxysmal nocturnal dyspnea and syncope.  Respiratory:  Positive for snoring (on CPAP).    Objective   Vitals with BMI 01/15/2022 07/13/2021 05/01/2021  Height 5\' 2"  5\' 2"  5\' 2"   Weight 300 lbs 288 lbs 291 lbs  BMI 54.86 38.18 29.93  Systolic 716 967 96  Diastolic 84 70 60  Pulse 80 59 79     Physical Exam Vitals reviewed.  Constitutional:      Comments: Moderately built and Morbidly obese in no acute distress.  Neck:     Thyroid: No thyromegaly.     Comments: Short neck and difficult to evaluate JVP Cardiovascular:     Rate and Rhythm: Normal rate. Rhythm irregular.     Pulses:          Carotid pulses are 2+ on the right side and 2+ on the left side.      Dorsalis pedis pulses are 2+ on the right side and 2+ on the left side.       Posterior tibial pulses are 2+ on the right  side and 2+ on the left side.     Heart sounds: No murmur heard.   No gallop.     Comments: S1 variable and S2 normal. Distant HS. Femoral and popliteal pulse difficult to feel due to patient's body habitus.  Pulmonary:     Effort: Pulmonary effort is normal.     Breath sounds: Normal breath sounds. No wheezing, rhonchi or rales.  Abdominal:     Comments: Obese. Pannus present  Musculoskeletal:     Right lower leg: Edema (trace) present.  Left lower leg: Edema (trace) present.     Comments: Bilateral lower legs large adipose tissue noted.   Laboratory examination:    CMP Latest Ref Rng & Units 04/10/2021 10/01/2019 09/23/2019  Glucose 65 - 99 mg/dL 77 112(H) 109(H)  BUN 8 - 27 mg/dL 16 19 19   Creatinine 0.57 - 1.00 mg/dL 0.84 1.06(H) 1.00  Sodium 134 - 144 mmol/L 143 139 140  Potassium 3.5 - 5.2 mmol/L 4.2 4.1 3.8  Chloride 96 - 106 mmol/L 101 100 99  CO2 20 - 29 mmol/L 27 27 31   Calcium 8.7 - 10.3 mg/dL 9.9 9.6 9.9  Total Protein 6.5 - 8.1 g/dL - - 7.4  Total Bilirubin 0.3 - 1.2 mg/dL - - 0.5  Alkaline Phos 38 - 126 U/L - - 81  AST 15 - 41 U/L - - 28  ALT 0 - 44 U/L - - 30   CBC Latest Ref Rng & Units 09/23/2019 12/29/2018 02/16/2018  WBC 4.0 - 10.5 K/uL 7.7 7.0 6.6  Hemoglobin 12.0 - 15.0 g/dL 14.8 14.6 13.9  Hematocrit 36.0 - 46.0 % 47.3(H) 45.8 43.4  Platelets 150 - 400 K/uL 261 257 196   Lipid Panel     Component Value Date/Time   CHOL 201 (H) 08/11/2021 1041   TRIG 156 (H) 08/11/2021 1041   HDL 35 (L) 08/11/2021 1041   CHOLHDL 3.5 Ratio 07/22/2009 0127   VLDL 26 07/22/2009 0127   LDLCALC 138 (H) 08/11/2021 1041   BNP    Component Value Date/Time   BNP 73.2 10/01/2019 1059   BNP 74.5 09/23/2019 1328    HEMOGLOBIN A1C Lab Results  Component Value Date   HGBA1C 6.3 (H) 09/02/2012   MPG 134 (H) 09/02/2012   TSH No results for input(s): TSH in the last 8760 hours.   External labs: 03/20/2021: Hemoglobin 13.4, hematocrit 41.6, MCV 90, platelet  203 Sodium 139, potassium 4.6, glucose 109, BUN 16, creatinine 0.89, GFR 66 Troponin 8-8 Magnesium 2.1 proBNP 600  01/26/2021: Sodium 141, potassium 3.8, glucose 108, BUN 17, creatinine 0.95, GFR 61 Hemoglobin 14.2, hematocrit 44.6, MCV 90, platelet 223 NT proBNP 891 Allergies   Allergies  Allergen Reactions   Meperidine Rash and Other (See Comments)    Reaction:  Bottoms out BP Other reaction(s): other/intolerance Hypotension    Atorvastatin Other (See Comments)    Reaction:  Muscle pain    Febuxostat Other (See Comments)    Reaction:  Visual changes  Other reaction(s): neurological reaction Visual changes Reaction:  Visual changes    Simvastatin     Other reaction(s): other/intolerance Elevated liver enzymes    Statins     Other reaction(s): muscle/joint pain   Zocor [Simvastatin - High Dose] Other (See Comments)    Reaction:  Elevated liver enzymes      Medications Prior to Visit:   Outpatient Medications Prior to Visit  Medication Sig Dispense Refill   amitriptyline (ELAVIL) 25 MG tablet Take 1 tablet by mouth daily.     Cholecalciferol (D3) 50 MCG (2000 UT) TABS Take by mouth.     colchicine 0.6 MG tablet as needed.     ELIQUIS 5 MG TABS tablet TAKE 1 TABLET TWICE A DAY 180 tablet 3   esomeprazole (NEXIUM) 40 MG capsule Take 40 mg by mouth every evening.     fluticasone (FLONASE) 50 MCG/ACT nasal spray Place into the nose as needed.     furosemide (LASIX) 40 MG tablet Take 1 tablet (40 mg total) by mouth  daily. 30 tablet 0   HYDROcodone-acetaminophen (NORCO/VICODIN) 5-325 MG tablet      levothyroxine (SYNTHROID, LEVOTHROID) 50 MCG tablet Take 50 mcg by mouth daily before breakfast.     lidocaine-prilocaine (EMLA) cream Apply 1 application topically as needed.     loratadine (CLARITIN) 10 MG tablet Take 10 mg by mouth daily.     LORazepam (ATIVAN) 1 MG tablet Take 1.5 mg by mouth 3 (three) times daily.     metoprolol succinate (TOPROL-XL) 100 MG 24 hr tablet TAKE  1 TABLET DAILY. TAKE WITH OR IMMEDIATELY FOLLOWING A MEAL. 90 tablet 1   miconazole (MICOTIN) 2 % cream Apply 1 application topically as needed.     nystatin-triamcinolone ointment (MYCOLOG) Apply 1 application topically as needed.     Polyethyl Glycol-Propyl Glycol (SYSTANE OP) Apply 2 drops to eye as needed. In both eyes     Sennosides-Docusate Sodium (STOOL SOFTENER/LAXATIVE PO) Take 50 mg by mouth as needed.     Tiotropium Bromide-Olodaterol (STIOLTO RESPIMAT) 2.5-2.5 MCG/ACT AERS Inhale 2 puffs into the lungs daily. 4 g 11   amitriptyline (ELAVIL) 25 MG tablet Take 25 mg by mouth at bedtime.     ezetimibe (ZETIA) 10 MG tablet Take 1 tablet (10 mg total) by mouth daily. (Patient not taking: Reported on 01/15/2022) 90 tablet 3   No facility-administered medications prior to visit.     Final Medications at End of Visit    Current Meds  Medication Sig   amitriptyline (ELAVIL) 25 MG tablet Take 1 tablet by mouth daily.   Cholecalciferol (D3) 50 MCG (2000 UT) TABS Take by mouth.   colchicine 0.6 MG tablet as needed.   ELIQUIS 5 MG TABS tablet TAKE 1 TABLET TWICE A DAY   esomeprazole (NEXIUM) 40 MG capsule Take 40 mg by mouth every evening.   fluticasone (FLONASE) 50 MCG/ACT nasal spray Place into the nose as needed.   furosemide (LASIX) 40 MG tablet Take 1 tablet (40 mg total) by mouth daily.   HYDROcodone-acetaminophen (NORCO/VICODIN) 5-325 MG tablet    levothyroxine (SYNTHROID, LEVOTHROID) 50 MCG tablet Take 50 mcg by mouth daily before breakfast.   lidocaine-prilocaine (EMLA) cream Apply 1 application topically as needed.   loratadine (CLARITIN) 10 MG tablet Take 10 mg by mouth daily.   LORazepam (ATIVAN) 1 MG tablet Take 1.5 mg by mouth 3 (three) times daily.   metoprolol succinate (TOPROL-XL) 100 MG 24 hr tablet TAKE 1 TABLET DAILY. TAKE WITH OR IMMEDIATELY FOLLOWING A MEAL.   miconazole (MICOTIN) 2 % cream Apply 1 application topically as needed.   nystatin-triamcinolone ointment  (MYCOLOG) Apply 1 application topically as needed.   Polyethyl Glycol-Propyl Glycol (SYSTANE OP) Apply 2 drops to eye as needed. In both eyes   Sennosides-Docusate Sodium (STOOL SOFTENER/LAXATIVE PO) Take 50 mg by mouth as needed.   Tiotropium Bromide-Olodaterol (STIOLTO RESPIMAT) 2.5-2.5 MCG/ACT AERS Inhale 2 puffs into the lungs daily.   Radiology:  No results found. CT angio 03/20/2021: No evidence of pulmonary emboli. No acute infiltrate or pleural effusion.  Mild emphysematous changes.  Stable pretracheal lymph node.  Stable pneumobilia   Chest x-ray 03/20/2021: Stable heart size and mediastinal contours. No focal infiltrate/consolidative changes. No pleural effusion or pneumothorax.  Cardiac Studies:   PCV MYOCARDIAL PERFUSION WITH LEXISCAN 04/03/2021 Lexiscan nuclear stress test performed using 1-day protocol. Absent tracer uptake in entire inferior myocardium likely due to large breast tissue attenuation. Imaging performed in sitting position. BMI 51. Ischemia in this region cannot be excluded.  Stress LVEF 71%. Intermediate risk study. Clinical correlation recommended.  CT angio pulmonary 01/24/2021: No acute pulmonary findings, negative for PE  Chest x-ray 01/24/2021: Bibasilar opacities may represent atelectasis or infiltrates  Echocardiogram 01/10/2021:  1. Left ventricular ejection fraction, by estimation, is 60 to 65%. The left ventricle has normal function. The left ventricle has no regional wall motion abnormalities. Left ventricular diastolic function could not be evaluated.   2. Right ventricular systolic function is normal. The right ventricular size is normal. There is normal pulmonary artery systolic pressure.   3. Left atrial size was moderately dilated.   4. Right atrial size was mild to moderately dilated.   5. The mitral valve was not well visualized. No evidence of mitral valve regurgitation. No evidence of mitral stenosis.   6. The aortic valve is normal in  structure. Aortic valve regurgitation is not visualized.   Echocardiogram 10/09/2019: Left ventricle cavity is normal in size. Moderate concentric hypertrophy of the left ventricle. Mild global hypokinesis. EF 45-50%. Unable to evaluate diastolic function due to atrial fibrillation.  Left atrial cavity is severely dilated. Moderate (Grade III) mitral regurgitation. Mild to moderate tricuspid regurgitation. Estimated pulmonary artery systolic pressure is 28 mmHg.   Compared to previous study on 09/20/2018, EF is reduced from 50-55% to 45-50%.  Lexiscan myoview stress test 03/29/2017: Two day protocol followed due to patient's bodily habitus. 1. The resting electrocardiogram demonstrated atrial fibrillation and normal rest repolarization, low voltage complexes.  Stress EKG is non-diagnostic for ischemia as it a pharmacologic stress using Lexiscan. 2. REST and STRESS images demonstrate decreased tracer uptake in the basal inferoseptal, basal inferior, mid inferoseptal and apical segments of the left ventricle.  These defects are related to breast attenuation. Mild ischemia in this region cannot be completely excluded.  Gated SPECT images reveal normal myocardial thickening and wall motion.  The left ventricular ejection fraction was calculated or visually estimated to be 60%.  This is a low risk study, clinical correlation recommended in view of patient's bodily habitus.  EKG  01/15/2022: Atrial fibrillation with controlled ventricular response at a rate of 87 bpm.  Left axis.  PR WP.  Nonspecific T wave abnormality.  Low voltage complexes, consider pulmonary disease pattern.Compared EKG 03/28/2021, no significant change.  EKG 03/28/2021: Atrial fibrillation with controlled ventricular response at a rate of 82 bpm.  Left axis. Poor R wave progression, cannot exclude anteroseptal infarct old.  Old inferior infarct.  Nonspecific T wave abnormality.  Low voltage complexes, consider pulmonary disease pattern.   Compared to EKG 02/02/2021, no significant change.  EKG 01/24/2021 (external): Atrial fibrillation with controlled ventricular response at a rate of 73 bpm.  Low voltage complexes.  Assessment     ICD-10-CM   1. Chronic diastolic (congestive) heart failure (HCC)  D17.61 Basic metabolic panel    Brain natriuretic peptide    CBC    2. Permanent atrial fibrillation (HCC)  I48.21 EKG 12-Lead    3. Hypercholesterolemia  E78.00 ezetimibe (ZETIA) 10 MG tablet    Basic metabolic panel    Brain natriuretic peptide    CBC      CHA2DS2-VASc Score is 4.  Yearly risk of stroke: 4% (A, F, HTN, CHF).   Meds ordered this encounter  Medications   ezetimibe (ZETIA) 10 MG tablet    Sig: Take 1 tablet (10 mg total) by mouth daily.    Dispense:  90 tablet    Refill:  3    Medications Discontinued During This Encounter  Medication Reason   amitriptyline (ELAVIL) 25 MG tablet Duplicate   ezetimibe (ZETIA) 10 MG tablet Reorder    Recommendations:   [name]  is a 81 y.o. Caucasian female with  permanent atrial fibrillation, morbid obesity, depression, chronic diastolic CHF, hypertension, frequency and urgency of urination that is chronic, very sensitive to medications.  Patient has been doing well, however she states over the last 3 months or so dyspnea on exertion has become a problem again.  Suspect this is related to obesity as EKG is unchanged compared to previous and there is no clinical evidence of heart failure at today's office visit.  Patient's blood pressure remains well controlled.  We will obtain BNP, BMP, and CBC to further evaluate if there is concern for volume overload.  We will also resume Zetia given uncontrolled hyperlipidemia last office visit.  We will plan to repeat lipid profile testing in 3 months.  Follow-up in 3 months, sooner if needed.   Alethia Berthold, PA-C 01/15/2022, 5:02 PM Office: [phone]

## 2022-01-23 DIAGNOSIS — E1165 Type 2 diabetes mellitus with hyperglycemia: Secondary | ICD-10-CM | POA: Diagnosis not present

## 2022-01-23 DIAGNOSIS — I10 Essential (primary) hypertension: Secondary | ICD-10-CM | POA: Diagnosis not present

## 2022-01-23 DIAGNOSIS — E782 Mixed hyperlipidemia: Secondary | ICD-10-CM | POA: Diagnosis not present

## 2022-01-26 DIAGNOSIS — E7800 Pure hypercholesterolemia, unspecified: Secondary | ICD-10-CM | POA: Diagnosis not present

## 2022-01-26 DIAGNOSIS — I5032 Chronic diastolic (congestive) heart failure: Secondary | ICD-10-CM | POA: Diagnosis not present

## 2022-01-27 LAB — BASIC METABOLIC PANEL
BUN/Creatinine Ratio: 14 (ref 12–28)
BUN: 13 mg/dL (ref 8–27)
CO2: 27 mmol/L (ref 20–29)
Calcium: 8.9 mg/dL (ref 8.7–10.3)
Chloride: 100 mmol/L (ref 96–106)
Creatinine, Ser: 0.93 mg/dL (ref 0.57–1.00)
Glucose: 104 mg/dL — ABNORMAL HIGH (ref 70–99)
Potassium: 4 mmol/L (ref 3.5–5.2)
Sodium: 141 mmol/L (ref 134–144)
eGFR: 63 mL/min/{1.73_m2} (ref >59)

## 2022-01-27 LAB — CBC
Hematocrit: 41.9 % (ref 34.0–46.6)
Hemoglobin: 13.8 g/dL (ref 11.1–15.9)
MCH: 28.9 pg (ref 26.6–33.0)
MCHC: 32.9 g/dL (ref 31.5–35.7)
MCV: 88 fL (ref 79–97)
Platelets: 197 10*3/uL (ref 150–450)
RBC: 4.78 x10E6/uL (ref 3.77–5.28)
RDW: 12.3 % (ref 11.7–15.4)
WBC: 4.6 10*3/uL (ref 3.4–10.8)

## 2022-01-27 LAB — BRAIN NATRIURETIC PEPTIDE: BNP: 111.5 pg/mL — ABNORMAL HIGH (ref 0.0–100.0)

## 2022-01-29 NOTE — Progress Notes (Signed)
CBC and BMP normal. BNP minimally elevated. Avoid high sodium intake and take Lasix 40 mg BID for 3 days then back to once dialy.

## 2022-01-31 NOTE — Progress Notes (Signed)
Called and spoke to pt, pt voiced understanding. Pt stated she does not really consume salt as it is. She agreed to take the Lasix as directed.

## 2022-02-08 ENCOUNTER — Other Ambulatory Visit: Payer: Self-pay | Admitting: Cardiology

## 2022-02-20 ENCOUNTER — Telehealth: Payer: Self-pay | Admitting: *Deleted

## 2022-02-20 NOTE — Telephone Encounter (Signed)
Returned call to Serita Degroote (Granddaughter) of patient from 1:39 PM. Verified appointment for Monday, 03/12/2022 at 10:50 with a 10:35 AM arrival time. ?

## 2022-03-07 ENCOUNTER — Other Ambulatory Visit: Payer: Self-pay | Admitting: Student

## 2022-03-07 DIAGNOSIS — I4819 Other persistent atrial fibrillation: Secondary | ICD-10-CM

## 2022-03-09 DIAGNOSIS — Z79899 Other long term (current) drug therapy: Secondary | ICD-10-CM | POA: Diagnosis not present

## 2022-03-09 DIAGNOSIS — Z87891 Personal history of nicotine dependence: Secondary | ICD-10-CM | POA: Diagnosis not present

## 2022-03-09 DIAGNOSIS — I110 Hypertensive heart disease with heart failure: Secondary | ICD-10-CM | POA: Diagnosis not present

## 2022-03-09 DIAGNOSIS — I4821 Permanent atrial fibrillation: Secondary | ICD-10-CM | POA: Diagnosis not present

## 2022-03-09 DIAGNOSIS — Z6841 Body Mass Index (BMI) 40.0 and over, adult: Secondary | ICD-10-CM | POA: Diagnosis not present

## 2022-03-09 DIAGNOSIS — Z90710 Acquired absence of both cervix and uterus: Secondary | ICD-10-CM | POA: Diagnosis not present

## 2022-03-09 DIAGNOSIS — Z79890 Hormone replacement therapy (postmenopausal): Secondary | ICD-10-CM | POA: Diagnosis not present

## 2022-03-09 DIAGNOSIS — E785 Hyperlipidemia, unspecified: Secondary | ICD-10-CM | POA: Diagnosis not present

## 2022-03-09 DIAGNOSIS — R0602 Shortness of breath: Secondary | ICD-10-CM | POA: Diagnosis not present

## 2022-03-09 DIAGNOSIS — E669 Obesity, unspecified: Secondary | ICD-10-CM | POA: Diagnosis not present

## 2022-03-09 DIAGNOSIS — I482 Chronic atrial fibrillation, unspecified: Secondary | ICD-10-CM | POA: Diagnosis not present

## 2022-03-09 DIAGNOSIS — I5032 Chronic diastolic (congestive) heart failure: Secondary | ICD-10-CM | POA: Diagnosis not present

## 2022-03-09 DIAGNOSIS — R0600 Dyspnea, unspecified: Secondary | ICD-10-CM | POA: Diagnosis not present

## 2022-03-09 DIAGNOSIS — Z7901 Long term (current) use of anticoagulants: Secondary | ICD-10-CM | POA: Diagnosis not present

## 2022-03-12 ENCOUNTER — Encounter: Payer: Self-pay | Admitting: Obstetrics & Gynecology

## 2022-03-12 ENCOUNTER — Ambulatory Visit (INDEPENDENT_AMBULATORY_CARE_PROVIDER_SITE_OTHER): Payer: Medicare Other | Admitting: Obstetrics & Gynecology

## 2022-03-12 VITALS — BP 125/81 | HR 59 | Ht 62.0 in | Wt 300.0 lb

## 2022-03-12 DIAGNOSIS — N3942 Incontinence without sensory awareness: Secondary | ICD-10-CM

## 2022-03-12 DIAGNOSIS — M7918 Myalgia, other site: Secondary | ICD-10-CM | POA: Diagnosis not present

## 2022-03-12 DIAGNOSIS — M5136 Other intervertebral disc degeneration, lumbar region: Secondary | ICD-10-CM | POA: Diagnosis not present

## 2022-03-12 DIAGNOSIS — R29898 Other symptoms and signs involving the musculoskeletal system: Secondary | ICD-10-CM | POA: Diagnosis not present

## 2022-03-12 DIAGNOSIS — M47816 Spondylosis without myelopathy or radiculopathy, lumbar region: Secondary | ICD-10-CM | POA: Diagnosis not present

## 2022-03-12 DIAGNOSIS — Z6841 Body Mass Index (BMI) 40.0 and over, adult: Secondary | ICD-10-CM | POA: Diagnosis not present

## 2022-03-12 DIAGNOSIS — G894 Chronic pain syndrome: Secondary | ICD-10-CM | POA: Diagnosis not present

## 2022-03-12 NOTE — Progress Notes (Signed)
? ?  Subjective:  ? ? Patient ID: [name], female    DOB: 1941/06/14, 81 y.o.   MRN: [id number] ? ?HPI ? ?[name] is a 81 year old G3 para 2102 female who presents for urinary incontinence.  She does not have a sensory urge to void.  She wakes up wet every morning and does not remember having the urge to urinate.  She also urinates with change of position when she does stand to have timed voiding.  She had a procedure in the 1980s by urologist to did some scarring to help with stress incontinence.  This allowed her to play tennis.  The symptoms returned and become much worse.  She did see a [facility] urologist who sounds like they did a urethral bulking agent which did not work.  At that point she felt like she was written off and was told that her only options were pure wick or a Foley catheter.  Patient likes reevaluation to see if there was medications or surgery that could keep her dry.  This is a very important goals of care for [place].  Patient had 2 vaginal deliveries of a 7 pound baby and a 5 pound 15 ounce baby.  Patient denies chronic cough or constipation. ? ?Review of Systems  ?Respiratory: Negative.    ?Cardiovascular: Negative.   ?Genitourinary:   ?     Urinary incont.  ?Psychiatric/Behavioral: Negative.    ? ?   ?Objective:  ? Physical Exam ?Vitals reviewed.  ?Constitutional:   ?   General: She is not in acute distress. ?   Appearance: She is well-developed.  ?HENT:  ?   Head: Normocephalic and atraumatic.  ?Eyes:  ?   Conjunctiva/sclera: Conjunctivae normal.  ?Cardiovascular:  ?   Rate and Rhythm: Normal rate.  ?Pulmonary:  ?   Effort: Pulmonary effort is normal.  ?Skin: ?   General: Skin is warm and dry.  ?Neurological:  ?   Mental Status: She is alert and oriented to person, place, and time.  ?Psychiatric:     ?   Mood and Affect: Mood normal.  ? ?Vitals:  ? 03/12/22 1035  ?BP: 125/81  ?Pulse: (!) 59  ?Weight: 300 lb (136.1 kg)  ?Height: '5\' 2"'$  (1.575 m)  ? ?   ?Assessment & Plan:  ?81 year old female with  urinary incontinence.  Her daughter has seen Dr. Wannetta Sender in the past and she would like to also have a consult there. ?Consult placed for urogynecology. ?No other GYN issues at this time. ? ?I provided 30 minutes of verbal and non-verbal time during this encounter date, time was needed to gather information, review chart, records, communicate/coordinate with staff, as well as complete documentation. ? ? ?

## 2022-03-13 DIAGNOSIS — R0789 Other chest pain: Secondary | ICD-10-CM | POA: Diagnosis not present

## 2022-04-02 DIAGNOSIS — Z20822 Contact with and (suspected) exposure to covid-19: Secondary | ICD-10-CM | POA: Diagnosis not present

## 2022-04-05 DIAGNOSIS — R5383 Other fatigue: Secondary | ICD-10-CM | POA: Diagnosis not present

## 2022-04-05 DIAGNOSIS — R1084 Generalized abdominal pain: Secondary | ICD-10-CM | POA: Diagnosis not present

## 2022-04-05 DIAGNOSIS — I509 Heart failure, unspecified: Secondary | ICD-10-CM | POA: Diagnosis not present

## 2022-04-05 DIAGNOSIS — Z0001 Encounter for general adult medical examination with abnormal findings: Secondary | ICD-10-CM | POA: Diagnosis not present

## 2022-04-05 DIAGNOSIS — Z23 Encounter for immunization: Secondary | ICD-10-CM | POA: Diagnosis not present

## 2022-04-05 DIAGNOSIS — Z0000 Encounter for general adult medical examination without abnormal findings: Secondary | ICD-10-CM | POA: Diagnosis not present

## 2022-04-05 DIAGNOSIS — I482 Chronic atrial fibrillation, unspecified: Secondary | ICD-10-CM | POA: Diagnosis not present

## 2022-04-05 DIAGNOSIS — M109 Gout, unspecified: Secondary | ICD-10-CM | POA: Diagnosis not present

## 2022-04-05 DIAGNOSIS — Z6841 Body Mass Index (BMI) 40.0 and over, adult: Secondary | ICD-10-CM | POA: Diagnosis not present

## 2022-04-09 DIAGNOSIS — M5136 Other intervertebral disc degeneration, lumbar region: Secondary | ICD-10-CM | POA: Diagnosis not present

## 2022-04-09 DIAGNOSIS — M47816 Spondylosis without myelopathy or radiculopathy, lumbar region: Secondary | ICD-10-CM | POA: Diagnosis not present

## 2022-04-09 DIAGNOSIS — R29898 Other symptoms and signs involving the musculoskeletal system: Secondary | ICD-10-CM | POA: Diagnosis not present

## 2022-04-09 DIAGNOSIS — R531 Weakness: Secondary | ICD-10-CM | POA: Diagnosis not present

## 2022-04-09 DIAGNOSIS — M545 Low back pain, unspecified: Secondary | ICD-10-CM | POA: Diagnosis not present

## 2022-04-09 DIAGNOSIS — G8929 Other chronic pain: Secondary | ICD-10-CM | POA: Diagnosis not present

## 2022-04-09 DIAGNOSIS — M7918 Myalgia, other site: Secondary | ICD-10-CM | POA: Diagnosis not present

## 2022-04-12 NOTE — Progress Notes (Deleted)
Primary Physician/Referring:  Denny Levy, PA  Patient ID: [name], female    DOB: 09-27-1941, 81 y.o.   MRN: [id number]  No chief complaint on file.  HPI:    CHANTALE LEUGERS  is a 81 y.o. Caucasian female with  permanent atrial fibrillation, morbid obesity, depression, chronic diastolic CHF, hypertension, frequency and urgency of urination that is chronic, very sensitive to medications.  Patient previously evaluated by pulmonology who feels shortness of breath most likely related to restrictive pattern given morbid obesity.  Since last office visit patient was again evaluated in [place] emergency department with complaints of dyspnea, again work-up at that time was unremarkable.  She now presents for 63-monthfollow-up.  At last office visit patient resumed Zetia, however repeat lipid profile testing has not been done. ***  ***  Patient was last seen in our office 07/13/2021 at which time shortness of breath had significantly improved, therefore hold off on further cardiac evaluation.  Patient has also been evaluated by pulmonology who feels shortness of breath is most likely related to restrictive pattern given morbid obesity.  Patient's primary concern today is she has noticed worsening dyspnea on exertion over the last 3 to 4 months compared to last office visit.  Denies chest pain, orthopnea, PND.  Patient has significant adipose tissue in her lower legs, but only minimal pitting edema.  Notably following last office visit had recommended initiation of Zetia, patient took this for a month however she did not get it refilled.  Past Medical History:  Diagnosis Date   A-fib ([facility]    Allergic rhinitis    Anxiety    Asthma    pt states she "believe it is more allergies"   Cellulitis and abscess of left leg 01/2020   CHF (congestive heart failure) (HCC)    Cholecystitis    Chronic abdominal pain    Colon polyp    TCS 2005 TICS [place], TCS/PROPOFOL 2011 SIMPLE ADENOMAS    Depression    Diverticulosis 2005   Fatty liver    Fibromyalgia    GERD (gastroesophageal reflux disease)    Gout    Hyperlipemia    Hypothyroidism    Internal hemorrhoids    H/o Rectal bleeding secondary to internal hemorrhoids 04/2010.   MCI (mild cognitive impairment) 12/30/2017   Migraine    Narcolepsy    Obesity, morbid (more than 100 lbs over ideal weight or BMI > 40) (HCC)    Obstructive sleep apnea on CPAP and O2   Osteoarthritis    [place]    a. s/p DCCV 01/2012   Volume overload    uses torsemide PRN   Past Surgical History:  Procedure Laterality Date   AFrancis  CARDIOVERSION  02/05/2012   Procedure: CARDIOVERSION;  Surgeon: JLaverda Page MD;  [place] [facility]  Service: Cardiovascular;  Laterality: N/A;   CARDIOVERSION N/A 06/09/2013   Procedure: CARDIOVERSION;  Surgeon: JLaverda Page MD;  [facility]  Service: Cardiovascular;  Laterality: N/A;  h&p in [facility]   CARDIOVERSION N/A 08/09/2015   Procedure: CARDIOVERSION;  Surgeon: JAdrian Prows MD;  [facility]  Service: Cardiovascular;  Laterality: N/A;   CARDIOVERSION N/A 09/13/2015   Procedure: CARDIOVERSION;  Surgeon: JAdrian Prows MD;  [facility];  Service: Cardiovascular;  Laterality: N/A;   CHOLECYSTECTOMY  09/03/2012   Procedure: LAPAROSCOPIC CHOLECYSTECTOMY;  Surgeon: MJamesetta So MD;  Location:  [facility];  Service: General;  Laterality: N/A;   COLONOSCOPY  2005   Dr. Tamala Julian: sigmoid diverticulosis   COLONOSCOPY  June 2011   Dr. Oneida Alar: internal hemorrhoids, simple adenomas, hyperplastic polyps, needs surveillance in June 2014 with overtube and Propofol   COLONOSCOPY WITH PROPOFOL N/A 03/19/2016   Dr. Gala Romney: multiple small tubular adenomas, diverticulosis. Surveillance in 5 years    ERCP  10/01/2012   Procedure: ENDOSCOPIC RETROGRADE CHOLANGIOPANCREATOGRAPHY (ERCP);  Surgeon: Daneil Dolin,  MD;  Location: [facility];  Service: Endoscopy;  Laterality: N/A;   ESOPHAGOGASTRODUODENOSCOPY (EGD) WITH PROPOFOL N/A 03/19/2016   DR. Rourk: normal esohpagus with small hiatal hernia    Kidney Stent  1995   POLYPECTOMY  03/19/2016   Procedure: POLYPECTOMY;  Surgeon: Daneil Dolin, MD;  Location: [facility];  Service: Endoscopy;;  cold snares   REMOVAL OF STONES  10/01/2012   Procedure: REMOVAL OF STONES;  Surgeon: Daneil Dolin, MD;  Location: [facility];  Service: Endoscopy;  Laterality: N/A;   RHINOPLASTY     SPHINCTEROTOMY  10/01/2012   Procedure: SPHINCTEROTOMY;  Surgeon: Daneil Dolin, MD;  Location: [facility];  Service: Endoscopy;  Laterality: N/A;   Family History  Problem Relation Age of Onset   Colon cancer Mother 22   Diabetes Mother    Asthma Mother    Heart disease Mother    Cerebral aneurysm Father    Asthma Maternal Aunt    Asthma Brother    Heart disease Maternal Grandfather    Heart disease Maternal Grandmother    Social History   Tobacco Use   Smoking status: Former    Packs/day: 0.50    Years: 30.00    Pack years: 15.00    Types: Cigarettes    Quit date: 10/03/1989    Years since quitting: 32.5   Smokeless tobacco: Never   Tobacco comments:    Quit in 1992  Substance Use Topics   Alcohol use: No    Alcohol/week: 0.0 standard drinks    Marital Status: Widowed   ROS  Review of Systems  Cardiovascular:  Positive for dyspnea on exertion (chronic, worse over the last 3 months). Negative for chest pain, claudication, leg swelling, near-syncope, orthopnea, palpitations, paroxysmal nocturnal dyspnea and syncope.  Respiratory:  Positive for snoring (on CPAP).    Objective      03/12/2022   10:35 AM 01/15/2022   10:57 AM 07/13/2021    2:12 PM  Vitals with BMI  Height '5\' 2"'$  '5\' 2"'$  '5\' 2"'$   Weight 300 lbs 300 lbs 288 lbs  BMI 54.86 08.65 78.46  Systolic 962 952 841  Diastolic 81 84 70  Pulse 59 80 59     Physical Exam Vitals reviewed.  Constitutional:       Comments: Moderately built and Morbidly obese in no acute distress.  Neck:     Thyroid: No thyromegaly.     Comments: Short neck and difficult to evaluate JVP Cardiovascular:     Rate and Rhythm: Normal rate. Rhythm irregular.     Pulses:          Carotid pulses are 2+ on the right side and 2+ on the left side.      Dorsalis pedis pulses are 2+ on the right side and 2+ on the left side.       Posterior tibial pulses are 2+ on the right side and 2+ on the left side.     Heart sounds: No murmur heard.  No gallop.     Comments: S1 variable and S2 normal. Distant HS. Femoral and popliteal pulse difficult to feel due to patient's body habitus.  Pulmonary:     Effort: Pulmonary effort is normal.     Breath sounds: Normal breath sounds. No wheezing, rhonchi or rales.  Abdominal:     Comments: Obese. Pannus present  Musculoskeletal:     Right lower leg: Edema (trace) present.     Left lower leg: Edema (trace) present.     Comments: Bilateral lower legs large adipose tissue noted.   Laboratory examination:       Latest Ref Rng & Units 01/26/2022    1:51 PM 04/10/2021    2:47 PM 10/01/2019   10:59 AM  CMP  Glucose 70 - 99 mg/dL 104   77   112    BUN 8 - 27 mg/dL '13   16   19    '$ Creatinine 0.57 - 1.00 mg/dL 0.93   0.84   1.06    Sodium 134 - 144 mmol/L 141   143   139    Potassium 3.5 - 5.2 mmol/L 4.0   4.2   4.1    Chloride 96 - 106 mmol/L 100   101   100    CO2 20 - 29 mmol/L '27   27   27    '$ Calcium 8.7 - 10.3 mg/dL 8.9   9.9   9.6        Latest Ref Rng & Units 01/26/2022    1:51 PM 09/23/2019    1:28 PM 12/29/2018    1:22 PM  CBC  WBC 3.4 - 10.8 x10E3/uL 4.6   7.7   7.0    Hemoglobin 11.1 - 15.9 g/dL 13.8   14.8   14.6    Hematocrit 34.0 - 46.6 % 41.9   47.3   45.8    Platelets 150 - 450 x10E3/uL 197   261   257     Lipid Panel     Component Value Date/Time   CHOL 201 (H) 08/11/2021 1041   TRIG 156 (H) 08/11/2021 1041   HDL 35 (L) 08/11/2021 1041   CHOLHDL 3.5 Ratio  07/22/2009 0127   VLDL 26 07/22/2009 0127   LDLCALC 138 (H) 08/11/2021 1041   BNP    Component Value Date/Time   BNP 111.5 (H) 01/26/2022 1351   BNP 74.5 09/23/2019 1328    HEMOGLOBIN A1C Lab Results  Component Value Date   HGBA1C 6.3 (H) 09/02/2012   MPG 134 (H) 09/02/2012   TSH No results for input(s): TSH in the last 8760 hours.   External labs: 03/20/2021: Hemoglobin 13.4, hematocrit 41.6, MCV 90, platelet 203 Sodium 139, potassium 4.6, glucose 109, BUN 16, creatinine 0.89, GFR 66 Troponin 8-8 Magnesium 2.1 proBNP 600  01/26/2021: Sodium 141, potassium 3.8, glucose 108, BUN 17, creatinine 0.95, GFR 61 Hemoglobin 14.2, hematocrit 44.6, MCV 90, platelet 223 NT proBNP 891 Allergies   Allergies  Allergen Reactions   Meperidine Rash and Other (See Comments)    Reaction:  Bottoms out BP Other reaction(s): other/intolerance Hypotension    Atorvastatin Other (See Comments)    Reaction:  Muscle pain    Febuxostat Other (See Comments)    Reaction:  Visual changes  Other reaction(s): neurological reaction Visual changes Reaction:  Visual changes    Simvastatin     Other reaction(s): other/intolerance Elevated liver enzymes    Statins     Other reaction(s): muscle/joint pain  Zocor [Simvastatin - High Dose] Other (See Comments)    Reaction:  Elevated liver enzymes      Medications Prior to Visit:   Outpatient Medications Prior to Visit  Medication Sig Dispense Refill   allopurinol (ZYLOPRIM) 100 MG tablet Take 100 mg by mouth daily.     amitriptyline (ELAVIL) 25 MG tablet Take 1 tablet by mouth daily.     Cholecalciferol (D3) 50 MCG (2000 UT) TABS Take by mouth.     colchicine 0.6 MG tablet as needed.     ELIQUIS 5 MG TABS tablet TAKE 1 TABLET TWICE A DAY 180 tablet 3   esomeprazole (NEXIUM) 40 MG capsule Take 40 mg by mouth every evening.     ezetimibe (ZETIA) 10 MG tablet Take 1 tablet (10 mg total) by mouth daily. (Patient not taking: Reported on 03/12/2022)  90 tablet 3   fluticasone (FLONASE) 50 MCG/ACT nasal spray Place into the nose as needed.     furosemide (LASIX) 40 MG tablet Take 1 tablet (40 mg total) by mouth daily. 30 tablet 0   HYDROcodone-acetaminophen (NORCO/VICODIN) 5-325 MG tablet      levothyroxine (SYNTHROID, LEVOTHROID) 50 MCG tablet Take 50 mcg by mouth daily before breakfast.     lidocaine-prilocaine (EMLA) cream Apply 1 application topically as needed.     loratadine (CLARITIN) 10 MG tablet Take 10 mg by mouth daily.     LORazepam (ATIVAN) 1 MG tablet Take 1.5 mg by mouth 3 (three) times daily.     metoprolol succinate (TOPROL-XL) 100 MG 24 hr tablet TAKE 1 TABLET DAILY. TAKE WITH OR IMMEDIATELY FOLLOWING A MEAL. 90 tablet 3   miconazole (MICOTIN) 2 % cream Apply 1 application topically as needed.     nystatin-triamcinolone ointment (MYCOLOG) Apply 1 application topically as needed.     Polyethyl Glycol-Propyl Glycol (SYSTANE OP) Apply 2 drops to eye as needed. In both eyes     Sennosides-Docusate Sodium (STOOL SOFTENER/LAXATIVE PO) Take 50 mg by mouth as needed.     Tiotropium Bromide-Olodaterol (STIOLTO RESPIMAT) 2.5-2.5 MCG/ACT AERS Inhale 2 puffs into the lungs daily. 4 g 11   No facility-administered medications prior to visit.     Final Medications at End of Visit    No outpatient medications have been marked as taking for the 04/16/22 encounter (Appointment) with Rayetta Pigg, Alzada Brazee C, PA-C.   Radiology:  No results found. CT angio 03/20/2021: No evidence of pulmonary emboli. No acute infiltrate or pleural effusion.  Mild emphysematous changes.  Stable pretracheal lymph node.  Stable pneumobilia   Chest x-ray 03/20/2021: Stable heart size and mediastinal contours. No focal infiltrate/consolidative changes. No pleural effusion or pneumothorax.  Cardiac Studies:   PCV MYOCARDIAL PERFUSION WITH LEXISCAN 04/03/2021 Lexiscan nuclear stress test performed using 1-day protocol. Absent tracer uptake in entire inferior  myocardium likely due to large breast tissue attenuation. Imaging performed in sitting position. BMI 51. Ischemia in this region cannot be excluded.  Stress LVEF 71%. Intermediate risk study. Clinical correlation recommended.  CT angio pulmonary 01/24/2021: No acute pulmonary findings, negative for PE  Chest x-ray 01/24/2021: Bibasilar opacities may represent atelectasis or infiltrates  Echocardiogram 01/10/2021:  1. Left ventricular ejection fraction, by estimation, is 60 to 65%. The left ventricle has normal function. The left ventricle has no regional wall motion abnormalities. Left ventricular diastolic function could not be evaluated.   2. Right ventricular systolic function is normal. The right ventricular size is normal. There is normal pulmonary artery systolic pressure.   3.  Left atrial size was moderately dilated.   4. Right atrial size was mild to moderately dilated.   5. The mitral valve was not well visualized. No evidence of mitral valve regurgitation. No evidence of mitral stenosis.   6. The aortic valve is normal in structure. Aortic valve regurgitation is not visualized.   Echocardiogram 10/09/2019: Left ventricle cavity is normal in size. Moderate concentric hypertrophy of the left ventricle. Mild global hypokinesis. EF 45-50%. Unable to evaluate diastolic function due to atrial fibrillation.  Left atrial cavity is severely dilated. Moderate (Grade III) mitral regurgitation. Mild to moderate tricuspid regurgitation. Estimated pulmonary artery systolic pressure is 28 mmHg.   Compared to previous study on 09/20/2018, EF is reduced from 50-55% to 45-50%.  Lexiscan myoview stress test 03/29/2017: Two day protocol followed due to patient's bodily habitus. 1. The resting electrocardiogram demonstrated atrial fibrillation and normal rest repolarization, low voltage complexes.  Stress EKG is non-diagnostic for ischemia as it a pharmacologic stress using Lexiscan. 2. REST and STRESS  images demonstrate decreased tracer uptake in the basal inferoseptal, basal inferior, mid inferoseptal and apical segments of the left ventricle.  These defects are related to breast attenuation. Mild ischemia in this region cannot be completely excluded.  Gated SPECT images reveal normal myocardial thickening and wall motion.  The left ventricular ejection fraction was calculated or visually estimated to be 60%.  This is a low risk study, clinical correlation recommended in view of patient's bodily habitus.  EKG  01/15/2022: Atrial fibrillation with controlled ventricular response at a rate of 87 bpm.  Left axis.  PR WP.  Nonspecific T wave abnormality.  Low voltage complexes, consider pulmonary disease pattern.Compared EKG 03/28/2021, no significant change.  EKG 03/28/2021: Atrial fibrillation with controlled ventricular response at a rate of 82 bpm.  Left axis. Poor R wave progression, cannot exclude anteroseptal infarct old.  Old inferior infarct.  Nonspecific T wave abnormality.  Low voltage complexes, consider pulmonary disease pattern.  Compared to EKG 02/02/2021, no significant change.  EKG 01/24/2021 (external): Atrial fibrillation with controlled ventricular response at a rate of 73 bpm.  Low voltage complexes.  Assessment   No diagnosis found.   CHA2DS2-VASc Score is 4.  Yearly risk of stroke: 4% (A, F, HTN, CHF).   No orders of the defined types were placed in this encounter.   There are no discontinued medications.   Recommendations:   VERLON CARCIONE  is a 81 y.o. Caucasian female with  permanent atrial fibrillation, morbid obesity, depression, chronic diastolic CHF, hypertension, frequency and urgency of urination that is chronic, very sensitive to medications.  Since last office visit patient was again evaluated in [place] emergency department with complaints of dyspnea, again work-up at that time was unremarkable.  She now presents for 32-monthfollow-up.  At last office visit  patient resumed Zetia, however repeat lipid profile testing has not been done. ***  ***  Patient has been doing well, however she states over the last 3 months or so dyspnea on exertion has become a problem again.  Suspect this is related to obesity as EKG is unchanged compared to previous and there is no clinical evidence of heart failure at today's office visit.  Patient's blood pressure remains well controlled.  We will obtain BNP, BMP, and CBC to further evaluate if there is concern for volume overload.  We will also resume Zetia given uncontrolled hyperlipidemia last office visit.  We will plan to repeat lipid profile testing in 3 months.  Follow-up in 3 months, sooner if needed.   Alethia Berthold, PA-C 04/12/2022, 10:52 AM Office: [phone]

## 2022-04-16 ENCOUNTER — Ambulatory Visit: Payer: Medicare Other | Admitting: Student

## 2022-04-16 DIAGNOSIS — I5032 Chronic diastolic (congestive) heart failure: Secondary | ICD-10-CM

## 2022-04-16 DIAGNOSIS — I4821 Permanent atrial fibrillation: Secondary | ICD-10-CM

## 2022-04-17 ENCOUNTER — Ambulatory Visit: Payer: Medicare Other | Admitting: Student

## 2022-04-18 ENCOUNTER — Ambulatory Visit: Payer: Medicare Other | Admitting: Student

## 2022-04-24 ENCOUNTER — Ambulatory Visit: Payer: Medicare Other | Admitting: Student

## 2022-04-26 ENCOUNTER — Ambulatory Visit: Payer: Medicare Other | Admitting: Student

## 2022-05-01 NOTE — Progress Notes (Unsigned)
Primary Physician/Referring:  Denny Levy, PA  Patient ID: [name], female    DOB: 27-Aug-1941, 81 y.o.   MRN: [id number]  No chief complaint on file.  HPI:    [name]  is a 81 y.o. Caucasian female with  permanent atrial fibrillation, morbid obesity, depression, chronic diastolic CHF, hypertension, frequency and urgency of urination that is chronic, very sensitive to medications.  Patient previously evaluated by pulmonology who feels shortness of breath most likely related to restrictive pattern given morbid obesity.  Since last office visit patient was again evaluated in [place] emergency department with complaints of dyspnea, again work-up at that time was unremarkable.  However in follow up with [facility] cardiology patient complained of new atypical chest pain and was advised to consider cardiac cath.  At last office visit patient resumed Zetia, however repeat lipid profile testing has not been done. ***  ***  Patient was last seen in our office 07/13/2021 at which time shortness of breath had significantly improved, therefore hold off on further cardiac evaluation.  Patient has also been evaluated by pulmonology who feels shortness of breath is most likely related to restrictive pattern given morbid obesity.  Patient's primary concern today is she has noticed worsening dyspnea on exertion over the last 3 to 4 months compared to last office visit.  Denies chest pain, orthopnea, PND.  Patient has significant adipose tissue in her lower legs, but only minimal pitting edema.  Notably following last office visit had recommended initiation of Zetia, patient took this for a month however she did not get it refilled.  Past Medical History:  Diagnosis Date   A-fib [facility])    Allergic rhinitis    Anxiety    Asthma    pt states she "believe it is more allergies"   Cellulitis and abscess of left leg 01/2020   CHF (congestive heart failure) (HCC)    Cholecystitis    Chronic  abdominal pain    Colon polyp    TCS 2005 TICS [place], TCS/PROPOFOL 2011 SIMPLE ADENOMAS   Depression    Diverticulosis 2005   Fatty liver    Fibromyalgia    GERD (gastroesophageal reflux disease)    Gout    Hyperlipemia    Hypothyroidism    Internal hemorrhoids    H/o Rectal bleeding secondary to internal hemorrhoids 04/2010.   MCI (mild cognitive impairment) 12/30/2017   Migraine    Narcolepsy    Obesity, morbid (more than 100 lbs over ideal weight or BMI > 40) (HCC)    Obstructive sleep apnea on CPAP and O2   Osteoarthritis    Persistent atrial fibrillation ([place])    a. s/p DCCV 01/2012   Volume overload    uses torsemide PRN   Past Surgical History:  Procedure Laterality Date   [place]   CARDIOVERSION  02/05/2012   Procedure: CARDIOVERSION;  Surgeon: Laverda Page, MD;  Location: [place];  Service: Cardiovascular;  Laterality: N/A;   CARDIOVERSION N/A 06/09/2013   Procedure: CARDIOVERSION;  Surgeon: Laverda Page, MD;  Location: [facility] ENDOSCOPY;  Service: Cardiovascular;  Laterality: N/A;  h&p in file-[place]   CARDIOVERSION N/A 08/09/2015   Procedure: CARDIOVERSION;  Surgeon: Adrian Prows, MD;  Location: [facility] ENDOSCOPY;  Service: Cardiovascular;  Laterality: N/A;   CARDIOVERSION N/A 09/13/2015   Procedure: CARDIOVERSION;  Surgeon: Adrian Prows, MD;  Location: [place];  Service: Cardiovascular;  Laterality: N/A;   CHOLECYSTECTOMY  09/03/2012   Procedure: LAPAROSCOPIC CHOLECYSTECTOMY;  Surgeon: Jamesetta So, MD;  Location: [facility] ORS;  Service: General;  Laterality: N/A;   COLONOSCOPY  2005   Dr. Tamala Julian: sigmoid diverticulosis   COLONOSCOPY  June 2011   Dr. Oneida Alar: internal hemorrhoids, simple adenomas, hyperplastic polyps, needs surveillance in June 2014 with overtube and Propofol   COLONOSCOPY WITH PROPOFOL N/A 03/19/2016   Dr. Gala Romney: multiple small tubular adenomas, diverticulosis. Surveillance in 5 years    ERCP  10/01/2012    Procedure: ENDOSCOPIC RETROGRADE CHOLANGIOPANCREATOGRAPHY (ERCP);  Surgeon: Daneil Dolin, MD;  Location: [facility];  Service: Endoscopy;  Laterality: N/A;   ESOPHAGOGASTRODUODENOSCOPY (EGD) WITH PROPOFOL N/A 03/19/2016   DR. Rourk: normal esohpagus with small hiatal hernia    Kidney Stent  1995   POLYPECTOMY  03/19/2016   Procedure: POLYPECTOMY;  Surgeon: Daneil Dolin, MD;  Location: [facility];  Service: Endoscopy;;  cold snares   REMOVAL OF STONES  10/01/2012   Procedure: REMOVAL OF STONES;  Surgeon: Daneil Dolin, MD;  Location: [facility];  Service: Endoscopy;  Laterality: N/A;   RHINOPLASTY     SPHINCTEROTOMY  10/01/2012   Procedure: SPHINCTEROTOMY;  Surgeon: Daneil Dolin, MD;  Location: [facility];  Service: Endoscopy;  Laterality: N/A;   Family History  Problem Relation Age of Onset   Colon cancer Mother 22   Diabetes Mother    Asthma Mother    Heart disease Mother    Cerebral aneurysm Father    Asthma Maternal Aunt    Asthma Brother    Heart disease Maternal Grandfather    Heart disease Maternal Grandmother    Social History   Tobacco Use   Smoking status: Former    Packs/day: 0.50    Years: 30.00    Pack years: 15.00    Types: Cigarettes    Quit date: 10/03/1989    Years since quitting: 32.5   Smokeless tobacco: Never   Tobacco comments:    Quit in 1992  Substance Use Topics   Alcohol use: No    Alcohol/week: 0.0 standard drinks    Marital Status: Widowed   ROS  Review of Systems  Cardiovascular:  Positive for dyspnea on exertion (chronic, worse over the last 3 months). Negative for chest pain, claudication, leg swelling, near-syncope, orthopnea, palpitations, paroxysmal nocturnal dyspnea and syncope.  Respiratory:  Positive for snoring (on CPAP).    Objective      03/12/2022   10:35 AM 01/15/2022   10:57 AM 07/13/2021    2:12 PM  Vitals with BMI  Height '5\' 2"'$  '5\' 2"'$  '5\' 2"'$   Weight 300 lbs 300 lbs 288 lbs  BMI 54.86 51.76 16.07  Systolic 371 062 694   Diastolic 81 84 70  Pulse 59 80 59     Physical Exam Vitals reviewed.  Constitutional:      Comments: Moderately built and Morbidly obese in no acute distress.  Neck:     Thyroid: No thyromegaly.     Comments: Short neck and difficult to evaluate JVP Cardiovascular:     Rate and Rhythm: Normal rate. Rhythm irregular.     Pulses:          Carotid pulses are 2+ on the right side and 2+ on the left side.      Dorsalis pedis pulses are 2+ on the right side and 2+ on the left side.       Posterior tibial pulses are 2+ on the right side and 2+ on  the left side.     Heart sounds: No murmur heard.   No gallop.     Comments: S1 variable and S2 normal. Distant HS. Femoral and popliteal pulse difficult to feel due to patient's body habitus.  Pulmonary:     Effort: Pulmonary effort is normal.     Breath sounds: Normal breath sounds. No wheezing, rhonchi or rales.  Abdominal:     Comments: Obese. Pannus present  Musculoskeletal:     Right lower leg: Edema (trace) present.     Left lower leg: Edema (trace) present.     Comments: Bilateral lower legs large adipose tissue noted.   Laboratory examination:       Latest Ref Rng & Units 01/26/2022    1:51 PM 04/10/2021    2:47 PM 10/01/2019   10:59 AM  CMP  Glucose 70 - 99 mg/dL 104   77   112    BUN 8 - 27 mg/dL '13   16   19    '$ Creatinine 0.57 - 1.00 mg/dL 0.93   0.84   1.06    Sodium 134 - 144 mmol/L 141   143   139    Potassium 3.5 - 5.2 mmol/L 4.0   4.2   4.1    Chloride 96 - 106 mmol/L 100   101   100    CO2 20 - 29 mmol/L '27   27   27    '$ Calcium 8.7 - 10.3 mg/dL 8.9   9.9   9.6        Latest Ref Rng & Units 01/26/2022    1:51 PM 09/23/2019    1:28 PM 12/29/2018    1:22 PM  CBC  WBC 3.4 - 10.8 x10E3/uL 4.6   7.7   7.0    Hemoglobin 11.1 - 15.9 g/dL 13.8   14.8   14.6    Hematocrit 34.0 - 46.6 % 41.9   47.3   45.8    Platelets 150 - 450 x10E3/uL 197   261   257     Lipid Panel     Component Value Date/Time   CHOL 201 (H)  08/11/2021 1041   TRIG 156 (H) 08/11/2021 1041   HDL 35 (L) 08/11/2021 1041   CHOLHDL 3.5 Ratio 07/22/2009 0127   VLDL 26 07/22/2009 0127   LDLCALC 138 (H) 08/11/2021 1041   BNP    Component Value Date/Time   BNP 111.5 (H) 01/26/2022 1351   BNP 74.5 09/23/2019 1328    HEMOGLOBIN A1C Lab Results  Component Value Date   HGBA1C 6.3 (H) 09/02/2012   MPG 134 (H) 09/02/2012   TSH No results for input(s): TSH in the last 8760 hours.   External labs: 03/20/2021: Hemoglobin 13.4, hematocrit 41.6, MCV 90, platelet 203 Sodium 139, potassium 4.6, glucose 109, BUN 16, creatinine 0.89, GFR 66 Troponin 8-8 Magnesium 2.1 proBNP 600  01/26/2021: Sodium 141, potassium 3.8, glucose 108, BUN 17, creatinine 0.95, GFR 61 Hemoglobin 14.2, hematocrit 44.6, MCV 90, platelet 223 NT proBNP 891 Allergies   Allergies  Allergen Reactions   Meperidine Rash and Other (See Comments)    Reaction:  Bottoms out BP Other reaction(s): other/intolerance Hypotension    Atorvastatin Other (See Comments)    Reaction:  Muscle pain    Febuxostat Other (See Comments)    Reaction:  Visual changes  Other reaction(s): neurological reaction Visual changes Reaction:  Visual changes    Simvastatin     Other reaction(s): other/intolerance Elevated liver enzymes  Statins     Other reaction(s): muscle/joint pain   Zocor [Simvastatin - High Dose] Other (See Comments)    Reaction:  Elevated liver enzymes      Medications Prior to Visit:   Outpatient Medications Prior to Visit  Medication Sig Dispense Refill   allopurinol (ZYLOPRIM) 100 MG tablet Take 100 mg by mouth daily.     amitriptyline (ELAVIL) 25 MG tablet Take 1 tablet by mouth daily.     Cholecalciferol (D3) 50 MCG (2000 UT) TABS Take by mouth.     colchicine 0.6 MG tablet as needed.     ELIQUIS 5 MG TABS tablet TAKE 1 TABLET TWICE A DAY 180 tablet 3   esomeprazole (NEXIUM) 40 MG capsule Take 40 mg by mouth every evening.     ezetimibe (ZETIA) 10  MG tablet Take 1 tablet (10 mg total) by mouth daily. (Patient not taking: Reported on 03/12/2022) 90 tablet 3   fluticasone (FLONASE) 50 MCG/ACT nasal spray Place into the nose as needed.     furosemide (LASIX) 40 MG tablet Take 1 tablet (40 mg total) by mouth daily. 30 tablet 0   HYDROcodone-acetaminophen (NORCO/VICODIN) 5-325 MG tablet      levothyroxine (SYNTHROID, LEVOTHROID) 50 MCG tablet Take 50 mcg by mouth daily before breakfast.     lidocaine-prilocaine (EMLA) cream Apply 1 application topically as needed.     loratadine (CLARITIN) 10 MG tablet Take 10 mg by mouth daily.     LORazepam (ATIVAN) 1 MG tablet Take 1.5 mg by mouth 3 (three) times daily.     metoprolol succinate (TOPROL-XL) 100 MG 24 hr tablet TAKE 1 TABLET DAILY. TAKE WITH OR IMMEDIATELY FOLLOWING A MEAL. 90 tablet 3   miconazole (MICOTIN) 2 % cream Apply 1 application topically as needed.     nystatin-triamcinolone ointment (MYCOLOG) Apply 1 application topically as needed.     Polyethyl Glycol-Propyl Glycol (SYSTANE OP) Apply 2 drops to eye as needed. In both eyes     Sennosides-Docusate Sodium (STOOL SOFTENER/LAXATIVE PO) Take 50 mg by mouth as needed.     Tiotropium Bromide-Olodaterol (STIOLTO RESPIMAT) 2.5-2.5 MCG/ACT AERS Inhale 2 puffs into the lungs daily. 4 g 11   No facility-administered medications prior to visit.     Final Medications at End of Visit    No outpatient medications have been marked as taking for the 05/02/22 encounter (Appointment) with Rayetta Pigg, Kaytlan Behrman C, PA-C.   Radiology:  No results found. CT angio 03/20/2021: No evidence of pulmonary emboli. No acute infiltrate or pleural effusion.  Mild emphysematous changes.  Stable pretracheal lymph node.  Stable pneumobilia   Chest x-ray 03/20/2021: Stable heart size and mediastinal contours. No focal infiltrate/consolidative changes. No pleural effusion or pneumothorax.  Cardiac Studies:   PCV MYOCARDIAL PERFUSION WITH LEXISCAN  04/03/2021 Lexiscan nuclear stress test performed using 1-day protocol. Absent tracer uptake in entire inferior myocardium likely due to large breast tissue attenuation. Imaging performed in sitting position. BMI 51. Ischemia in this region cannot be excluded.  Stress LVEF 71%. Intermediate risk study. Clinical correlation recommended.  CT angio pulmonary 01/24/2021: No acute pulmonary findings, negative for PE  Chest x-ray 01/24/2021: Bibasilar opacities may represent atelectasis or infiltrates  Echocardiogram 01/10/2021:  1. Left ventricular ejection fraction, by estimation, is 60 to 65%. The left ventricle has normal function. The left ventricle has no regional wall motion abnormalities. Left ventricular diastolic function could not be evaluated.   2. Right ventricular systolic function is normal. The right ventricular size is  normal. There is normal pulmonary artery systolic pressure.   3. Left atrial size was moderately dilated.   4. Right atrial size was mild to moderately dilated.   5. The mitral valve was not well visualized. No evidence of mitral valve regurgitation. No evidence of mitral stenosis.   6. The aortic valve is normal in structure. Aortic valve regurgitation is not visualized.   Echocardiogram 10/09/2019: Left ventricle cavity is normal in size. Moderate concentric hypertrophy of the left ventricle. Mild global hypokinesis. EF 45-50%. Unable to evaluate diastolic function due to atrial fibrillation.  Left atrial cavity is severely dilated. Moderate (Grade III) mitral regurgitation. Mild to moderate tricuspid regurgitation. Estimated pulmonary artery systolic pressure is 28 mmHg.   Compared to previous study on 09/20/2018, EF is reduced from 50-55% to 45-50%.  Lexiscan myoview stress test 03/29/2017: Two day protocol followed due to patient's bodily habitus. 1. The resting electrocardiogram demonstrated atrial fibrillation and normal rest repolarization, low voltage  complexes.  Stress EKG is non-diagnostic for ischemia as it a pharmacologic stress using Lexiscan. 2. REST and STRESS images demonstrate decreased tracer uptake in the basal inferoseptal, basal inferior, mid inferoseptal and apical segments of the left ventricle.  These defects are related to breast attenuation. Mild ischemia in this region cannot be completely excluded.  Gated SPECT images reveal normal myocardial thickening and wall motion.  The left ventricular ejection fraction was calculated or visually estimated to be 60%.  This is a low risk study, clinical correlation recommended in view of patient's bodily habitus.  EKG  01/15/2022: Atrial fibrillation with controlled ventricular response at a rate of 87 bpm.  Left axis.  PR WP.  Nonspecific T wave abnormality.  Low voltage complexes, consider pulmonary disease pattern.Compared EKG 03/28/2021, no significant change.  EKG 03/28/2021: Atrial fibrillation with controlled ventricular response at a rate of 82 bpm.  Left axis. Poor R wave progression, cannot exclude anteroseptal infarct old.  Old inferior infarct.  Nonspecific T wave abnormality.  Low voltage complexes, consider pulmonary disease pattern.  Compared to EKG 02/02/2021, no significant change.  EKG 01/24/2021 (external): Atrial fibrillation with controlled ventricular response at a rate of 73 bpm.  Low voltage complexes.  Assessment   No diagnosis found.   CHA2DS2-VASc Score is 4.  Yearly risk of stroke: 4% (A, F, HTN, CHF).   No orders of the defined types were placed in this encounter.   There are no discontinued medications.   Recommendations:   [name]  is a 81 y.o. Caucasian female with  permanent atrial fibrillation, morbid obesity, depression, chronic diastolic CHF, hypertension, frequency and urgency of urination that is chronic, very sensitive to medications.  Since last office visit patient was again evaluated in [place] emergency department with complaints of  dyspnea, again work-up at that time was unremarkable.  However in follow up with [facility] cardiology patient complained of new atypical chest pain and was advised to consider cardiac cath.  At last office visit patient resumed Zetia, however repeat lipid profile testing has not been done. ***  ***  Patient has been doing well, however she states over the last 3 months or so dyspnea on exertion has become a problem again.  Suspect this is related to obesity as EKG is unchanged compared to previous and there is no clinical evidence of heart failure at today's office visit.  Patient's blood pressure remains well controlled.  We will obtain BNP, BMP, and CBC to further evaluate if there is concern for volume  overload.  We will also resume Zetia given uncontrolled hyperlipidemia last office visit.  We will plan to repeat lipid profile testing in 3 months.  Follow-up in 3 months, sooner if needed.   Alethia Berthold, PA-C 05/01/2022, 1:37 PM Office: [phone]

## 2022-05-02 ENCOUNTER — Encounter: Payer: Self-pay | Admitting: Student

## 2022-05-02 ENCOUNTER — Ambulatory Visit (INDEPENDENT_AMBULATORY_CARE_PROVIDER_SITE_OTHER): Payer: Medicare Other | Admitting: Obstetrics and Gynecology

## 2022-05-02 ENCOUNTER — Ambulatory Visit: Payer: Medicare Other | Admitting: Student

## 2022-05-02 ENCOUNTER — Encounter: Payer: Self-pay | Admitting: Obstetrics and Gynecology

## 2022-05-02 VITALS — BP 94/62 | HR 62 | Wt 290.0 lb

## 2022-05-02 VITALS — BP 104/71 | HR 56 | Temp 97.2°F | Resp 16 | Ht 62.0 in | Wt 290.0 lb

## 2022-05-02 DIAGNOSIS — R9439 Abnormal result of other cardiovascular function study: Secondary | ICD-10-CM | POA: Diagnosis not present

## 2022-05-02 DIAGNOSIS — N3281 Overactive bladder: Secondary | ICD-10-CM

## 2022-05-02 DIAGNOSIS — I4821 Permanent atrial fibrillation: Secondary | ICD-10-CM

## 2022-05-02 DIAGNOSIS — R350 Frequency of micturition: Secondary | ICD-10-CM | POA: Diagnosis not present

## 2022-05-02 DIAGNOSIS — N393 Stress incontinence (female) (male): Secondary | ICD-10-CM

## 2022-05-02 DIAGNOSIS — N816 Rectocele: Secondary | ICD-10-CM

## 2022-05-02 DIAGNOSIS — E7800 Pure hypercholesterolemia, unspecified: Secondary | ICD-10-CM

## 2022-05-02 DIAGNOSIS — I5032 Chronic diastolic (congestive) heart failure: Secondary | ICD-10-CM | POA: Diagnosis not present

## 2022-05-02 LAB — POCT URINALYSIS DIPSTICK
Appearance: NORMAL
Bilirubin, UA: NEGATIVE
Glucose, UA: NEGATIVE
Ketones, UA: NEGATIVE
Nitrite, UA: NEGATIVE
Protein, UA: NEGATIVE
Spec Grav, UA: >=1.03 — AB (ref 1.010–1.025)
Urobilinogen, UA: 0.2 E.U./dL
pH, UA: 6 (ref 5.0–8.0)

## 2022-05-02 NOTE — Patient Instructions (Signed)
Coronary Angiogram With Stent Coronary angiogram with stent placement is a procedure to widen or open a narrow blood vessel of the heart (coronary artery). Arteries may become blocked by cholesterol buildup (plaques) in the lining of the artery wall. When a coronary artery becomes partially blocked, blood flow to that area decreases. This may lead to chest pain or a heart attack (myocardial infarction). A stent is a small piece of metal that looks like mesh or spring. Stent placement may be done as treatment after a heart attack, or to prevent a heart attack if a blocked artery is found by a coronary angiogram. Let your health care provider know about: Any allergies you have, including allergies to medicines or contrast dye. All medicines you are taking, including vitamins, herbs, eye drops, creams, and over-the-counter medicines. Any problems you or family members have had with anesthetic medicines. Any blood disorders you have. Any surgeries you have had. Any medical conditions you have, including kidney problems or kidney failure. Whether you are pregnant or may be pregnant. Whether you are breastfeeding. What are the risks? Generally, this is a safe procedure. However, serious problems may occur, including: Damage to nearby structures or organs, such as the heart, blood vessels, or kidneys. A return of blockage. Bleeding, infection, or bruising at the insertion site. A collection of blood under the skin (hematoma) at the insertion site. A blood clot in another part of the body. Allergic reaction to medicines or dyes. Bleeding into the abdomen (retroperitoneal bleeding). Stroke (rare). Heart attack (rare). What happens before the procedure? Staying hydrated Follow instructions from your health care provider about hydration, which may include: Up to 2 hours before the procedure - you may continue to drink clear liquids, such as water, clear fruit juice, black coffee, and plain  tea.  Eating and drinking restrictions Follow instructions from your health care provider about eating and drinking, which may include: 8 hours before the procedure - stop eating heavy meals or foods, such as meat, fried foods, or fatty foods. 6 hours before the procedure - stop eating light meals or foods, such as toast or cereal. 2 hours before the procedure - stop drinking clear liquids. Medicines Ask your health care provider about: Changing or stopping your regular medicines. This is especially important if you are taking diabetes medicines or blood thinners. Taking medicines such as aspirin and ibuprofen. These medicines can thin your blood. Do not take these medicines unless your health care provider tells you to take them. Generally, aspirin is recommended before a thin tube, called a catheter, is passed through a blood vessel and inserted into the heart (cardiac catheterization). Taking over-the-counter medicines, vitamins, herbs, and supplements. General instructions Do not use any products that contain nicotine or tobacco for at least 4 weeks before the procedure. These products include cigarettes, e-cigarettes, and chewing tobacco. If you need help quitting, ask your health care provider. Plan to have someone take you home from the hospital or clinic. If you will be going home right after the procedure, plan to have someone with you for 24 hours. You may have tests and imaging procedures. Ask your health care provider: How your insertion site will be marked. Ask which artery will be used for the procedure. What steps will be taken to help prevent infection. These may include: Removing hair at the insertion site. Washing skin with a germ-killing soap. Taking antibiotic medicine. What happens during the procedure?  An IV will be inserted into one of your veins. Electrodes  may be placed on your chest to monitor your heart rate during the procedure. You will be given one or more  of the following: A medicine to help you relax (sedative). A medicine to numb the area (local anesthetic) for catheter insertion. A small incision will be made for catheter insertion. The catheter will be inserted into an artery using a guide wire. The location may be in your groin, your wrist, or the fold of your arm (near your elbow). An X-ray procedure (fluoroscopy) will be used to help guide the catheter to the opening of the heart arteries. A dye will be injected into the catheter. X-rays will be taken. The dye helps to show where any narrowing or blockages are located in the arteries. Tell your health care provider if you have chest pain or trouble breathing. A tiny wire will be guided to the blocked spot, and a balloon will be inflated to make the artery wider. The stent will be expanded to crush the plaques into the wall of the vessel. The stent will hold the area open and improve the blood flow. Most stents have a drug coating to reduce the risk of the stent narrowing over time. The artery may be made wider using a drill, laser, or other tools that remove plaques. The catheter will be removed when the blood flow improves. The stent will stay where it was placed, and the lining of the artery will grow over it. A bandage (dressing) will be placed on the insertion site. Pressure will be applied to stop bleeding. The IV will be removed. This procedure may vary among health care providers and hospitals. What happens after the procedure? Your blood pressure, heart rate, breathing rate, and blood oxygen level will be monitored until you leave the hospital or clinic. If the procedure is done through the leg, you will lie flat in bed for a few hours or for as long as told by your health care provider. You will be instructed not to bend or cross your legs. The insertion site and the pulse in your foot or wrist will be checked often. You may have more blood tests, X-rays, and a test that records the  electrical activity of your heart (electrocardiogram, or ECG). Do not drive for 24 hours if you were given a sedative during your procedure. Summary Coronary angiogram with stent placement is a procedure to widen or open a narrowed coronary artery. This is done to treat heart problems. Before the procedure, let your health care provider know about all the medical conditions and surgeries you have or have had. This is a safe procedure. However, some problems may occur, including damage to nearby structures or organs, bleeding, blood clots, or allergies. Follow your health care provider's instructions about eating, drinking, medicines, and other lifestyle changes, such as quitting tobacco use before the procedure. This information is not intended to replace advice given to you by your health care provider. Make sure you discuss any questions you have with your health care provider. Document Revised: 06/03/2019 Document Reviewed: 06/03/2019 Elsevier Patient Education  [place].

## 2022-05-02 NOTE — Progress Notes (Signed)
[facility] New Patient Evaluation and Consultation  Referring Provider: Denny Levy, [place] PCP: Denny Levy, [place] Date of Service: 05/02/2022  SUBJECTIVE Chief Complaint: New Patient (Initial Visit)  History of Present Illness: [name] is a 81 y.o. White or Caucasian female presenting for evaluation of urinary incontinence.     Urinary Symptoms: Leaks urine with cough/ sneeze, laughing, going from sitting to standing, with a full bladder, with movement to the bathroom, with urgency, and without sensation Leaks 8-10 time(s) per day. Has "no control"  Pad use: 8-10 pads per day.   She is bothered by her UI symptoms. Had had symptoms for several years.  Seen by a Urologist at [facility] in 2021 and had cystoscopy with macroplastique injection. She felt this made her symptoms worse.  Has tried myrbetriq with no success. Has also had pelvic floor PT.  Has a history of "bladder tack" for leakage in the 80s- reports use of her own tissue to make a sling (suspect fascial sling). This helped for some time.   Day time voids 8-10.  Nocturia: 0 times per night to void. Voiding dysfunction: she does not empty her bladder well.  does not use a catheter to empty bladder.  When urinating, she feels a weak stream Drinks: does not drink much due to heart failure. Rarely will have coffee or tea.   UTIs:  0  UTI's in the last year.   Reports history of kidney or bladder stones in the past  Pelvic Organ Prolapse Symptoms:                  She Denies a feeling of a bulge the vaginal area.   Bowel Symptom: Bowel movements: 1 time(s) per day Stool consistency: soft  Straining: yes.  Splinting: no.  Incomplete evacuation: no.  She Denies accidental bowel leakage / fecal incontinence Bowel regimen: stool softener and miralax Last colonoscopy: Date 6/21- negative  Sexual Function Sexually active: no.   Pelvic Pain Admits to pelvic pain Location: bladder   Past Medical  History:  Past Medical History:  Diagnosis Date   A-fib ([place])    Allergic rhinitis    Anxiety    Asthma    pt states she "believe it is more allergies"   Cellulitis and abscess of left leg 01/2020   CHF (congestive heart failure) (HCC)    Cholecystitis    Chronic abdominal pain    Colon polyp    TCS 2005 TICS [place], TCS/PROPOFOL 2011 SIMPLE ADENOMAS   Depression    Diverticulosis 2005   Fatty liver    Fibromyalgia    GERD (gastroesophageal reflux disease)    Gout    Hyperlipemia    Hypothyroidism    Internal hemorrhoids    H/o Rectal bleeding secondary to internal hemorrhoids 04/2010.   MCI (mild cognitive impairment) 12/30/2017   Migraine    Narcolepsy    Obesity, morbid (more than 100 lbs over ideal weight or BMI > 40) (HCC)    Obstructive sleep apnea on CPAP and O2   Osteoarthritis    Persistent atrial fibrillation ([place])    a. s/p DCCV 01/2012   Volume overload    uses torsemide PRN     Past Surgical History:   Past Surgical History:  Procedure Laterality Date   ABDOMINAL HYSTERECTOMY  [place]   CARDIOVERSION  02/05/2012   Procedure: CARDIOVERSION;  Surgeon: Laverda Page, MD;  Location: [place];  Service: Cardiovascular;  Laterality: N/A;   CARDIOVERSION N/A 06/09/2013   Procedure: CARDIOVERSION;  Surgeon: Laverda Page, MD;  Location: [place];  Service: Cardiovascular;  Laterality: N/A;  h&p in file-[place]   CARDIOVERSION N/A 08/09/2015   Procedure: CARDIOVERSION;  Surgeon: Adrian Prows, MD;  Location: [place];  Service: Cardiovascular;  Laterality: N/A;   CARDIOVERSION N/A 09/13/2015   Procedure: CARDIOVERSION;  Surgeon: Adrian Prows, MD;  Location: [place];  Service: Cardiovascular;  Laterality: N/A;   CHOLECYSTECTOMY  09/03/2012   Procedure: LAPAROSCOPIC CHOLECYSTECTOMY;  Surgeon: Jamesetta So, MD;  Location: [facility];  Service: General;  Laterality: N/A;   COLONOSCOPY  2005   Dr. Tamala Julian: sigmoid diverticulosis    COLONOSCOPY  June 2011   Dr. Oneida Alar: internal hemorrhoids, simple adenomas, hyperplastic polyps, needs surveillance in June 2014 with overtube and Propofol   COLONOSCOPY WITH PROPOFOL N/A 03/19/2016   Dr. Gala Romney: multiple small tubular adenomas, diverticulosis. Surveillance in 5 years    ERCP  10/01/2012   Procedure: ENDOSCOPIC RETROGRADE CHOLANGIOPANCREATOGRAPHY (ERCP);  Surgeon: Daneil Dolin, MD;  Location: [facility];  Service: Endoscopy;  Laterality: N/A;   ESOPHAGOGASTRODUODENOSCOPY (EGD) WITH PROPOFOL N/A 03/19/2016   DR. Rourk: normal esohpagus with small hiatal hernia    Kidney Stent  1995   POLYPECTOMY  03/19/2016   Procedure: POLYPECTOMY;  Surgeon: Daneil Dolin, MD;  [facility];  Service: Endoscopy;;  cold snares   REMOVAL OF STONES  10/01/2012   Procedure: REMOVAL OF STONES;  Surgeon: Daneil Dolin, MD;  Location: [facility];  Service: Endoscopy;  Laterality: N/A;   RHINOPLASTY     SPHINCTEROTOMY  10/01/2012   Procedure: SPHINCTEROTOMY;  Surgeon: Daneil Dolin, MD;  Location: [facility];  Service: Endoscopy;  Laterality: N/A;     Past OB/GYN History: OB History  Gravida Para Term Preterm AB Living  '3 2     1 2  '$ SAB IAB Ectopic Multiple Live Births  1       2    # Outcome Date GA Lbr Len/2nd Weight Sex Delivery Anes PTL Lv  3 SAB           2 Para           1 Para             Vaginal deliveries: 2,  Forceps/ Vacuum deliveries: 0 , Cesarean section: 0 S/p hysterectomy   Medications: She has a current medication list which includes the following prescription(s): allopurinol, d3, colchicine, eliquis, esomeprazole, ezetimibe, fluticasone, furosemide, hydrocodone-acetaminophen, levothyroxine, loratadine, lorazepam, metoprolol succinate, miconazole, nystatin-triamcinolone ointment, polyethyl glycol-propyl glycol, sennosides-docusate sodium, and stiolto respimat.   Allergies: Patient is allergic to meperidine, atorvastatin, febuxostat, simvastatin, statins, and zocor  [simvastatin - high dose].   Social History:  Social History   Tobacco Use   Smoking status: Former    Packs/day: 0.50    Years: 30.00    Total pack years: 15.00    Types: Cigarettes    Quit date: 10/03/1989    Years since quitting: 32.6   Smokeless tobacco: Never   Tobacco comments:    Quit in 1992  Vaping Use   Vaping Use: Never used  Substance Use Topics   Alcohol use: No    Alcohol/week: 0.0 standard drinks of alcohol   Drug use: No    Relationship status: widowed She lives with her daughter/ granddaughter.   She is not employed. Regular exercise: No History of abuse: No  Family History:   Family History  Problem  Relation Age of Onset   Colon cancer Mother 32   Diabetes Mother    Asthma Mother    Heart disease Mother    Cerebral aneurysm Father    Asthma Maternal Aunt    Asthma Brother    Heart disease Maternal Grandfather    Heart disease Maternal Grandmother      Review of Systems: Review of Systems  Constitutional:  Negative for fever, malaise/fatigue and weight loss.  Respiratory:  Positive for shortness of breath and wheezing. Negative for cough.   Cardiovascular:  Positive for chest pain and leg swelling. Negative for palpitations.  Gastrointestinal:  Negative for abdominal pain and blood in stool.  Genitourinary:  Negative for dysuria.  Musculoskeletal:  Negative for myalgias.  Skin:  Negative for rash.  Neurological:  Positive for dizziness. Negative for headaches.  Endo/Heme/Allergies:  Does not bruise/bleed easily.  Psychiatric/Behavioral:  Positive for depression. The patient is nervous/anxious.      OBJECTIVE Physical Exam: Vitals:   05/02/22 1327  BP: 94/62  Pulse: 62  Weight: 290 lb (131.5 kg)    Physical Exam Constitutional:      General: She is not in acute distress.    Appearance: She is obese.  Pulmonary:     Effort: Pulmonary effort is normal.  Abdominal:     General: There is no distension.     Palpations: Abdomen is  soft.     Tenderness: There is no abdominal tenderness. There is no rebound.     Comments: Midline abdominal scar  Musculoskeletal:        General: No swelling. Normal range of motion.  Skin:    General: Skin is warm and dry.     Findings: No rash.  Neurological:     Mental Status: She is alert and oriented to person, place, and time.  Psychiatric:        Mood and Affect: Mood normal.        Behavior: Behavior normal.      GU / Detailed Urogynecologic Evaluation:  Pelvic Exam: Normal external female genitalia; Bartholin's and Skene's glands normal in appearance; urethral meatus normal in appearance, no urethral masses or discharge.   CST: negative  s/p hysterectomy: Speculum exam reveals normal vaginal mucosa with  atrophy and normal vaginal cuff.  Adnexa not palpable  Pelvic floor strength 0/V- unable to contract pelvic floor  Pelvic floor musculature: Right levator non-tender, Right obturator non-tender, Left levator non-tender, Left obturator non-tender  POP-Q:   POP-Q  -3                                            Aa   -3                                           Ba  -9                                              C   3  Gh  4                                            Pb  9                                            tvl   -1                                            Ap  -1                                            Bp                                                 D     Rectal Exam:  Normal external recrum  Post-Void Residual (PVR) by Bladder Scan: In order to evaluate bladder emptying, we discussed obtaining a postvoid residual and she agreed to this procedure.  Procedure: The ultrasound unit was placed on the patient's abdomen in the suprapubic region after the patient had voided. A PVR of 95 ml was obtained by bladder scan.  Laboratory Results: POC urine: trace blood and leukocytes, otherwise  negative   ASSESSMENT AND PLAN Ms. Depaolis is a 81 y.o. with:  1. Overactive bladder   2. SUI (stress urinary incontinence, female)   3. Prolapse of posterior vaginal wall   4. Urinary frequency     OAB - We discussed the symptoms of overactive bladder (OAB), which include urinary urgency, urinary frequency, nocturia, with or without urge incontinence.  While we do not know the exact etiology of OAB, several treatment options exist. We discussed management including behavioral therapy (decreasing bladder irritants, urge suppression strategies, timed voids, bladder retraining), physical therapy, medication; for refractory cases posterior tibial nerve stimulation, sacral neuromodulation, and intravesical botulinum toxin injection.  - She would not be a good candidate for surgical intervention. Recommended medication management or PTNS. She is not sure she wants to start another medication at this time. Handouts provided on her options. Can consider urodynamics if she is not responsive to medications.   2. SUI - For treatment of stress urinary incontinence,  non-surgical options include expectant management, weight loss, physical therapy, as well as a pessary.  Previously had urethral bulking. Not a good candidate for a sling due to comorbidities and morbid obesity.  - We discussed the option of a pessary as a low risk option.   3. Posterior vaginal prolapse - asymptomatic currently  She will consider her options and notify our office on which treatment she wants to pursue.    Jaquita Folds, MD

## 2022-06-12 DIAGNOSIS — M5136 Other intervertebral disc degeneration, lumbar region: Secondary | ICD-10-CM | POA: Diagnosis not present

## 2022-06-12 DIAGNOSIS — Z6841 Body Mass Index (BMI) 40.0 and over, adult: Secondary | ICD-10-CM | POA: Diagnosis not present

## 2022-06-12 DIAGNOSIS — R29898 Other symptoms and signs involving the musculoskeletal system: Secondary | ICD-10-CM | POA: Diagnosis not present

## 2022-06-12 DIAGNOSIS — G894 Chronic pain syndrome: Secondary | ICD-10-CM | POA: Diagnosis not present

## 2022-06-12 DIAGNOSIS — M47816 Spondylosis without myelopathy or radiculopathy, lumbar region: Secondary | ICD-10-CM | POA: Diagnosis not present

## 2022-06-12 DIAGNOSIS — Z79891 Long term (current) use of opiate analgesic: Secondary | ICD-10-CM | POA: Diagnosis not present

## 2022-07-11 DIAGNOSIS — H33321 Round hole, right eye: Secondary | ICD-10-CM | POA: Diagnosis not present

## 2022-07-11 DIAGNOSIS — H2513 Age-related nuclear cataract, bilateral: Secondary | ICD-10-CM | POA: Diagnosis not present

## 2022-07-24 ENCOUNTER — Ambulatory Visit: Payer: Medicare Other | Admitting: Obstetrics and Gynecology

## 2022-08-17 ENCOUNTER — Encounter: Payer: Self-pay | Admitting: Obstetrics and Gynecology

## 2022-08-17 ENCOUNTER — Ambulatory Visit (INDEPENDENT_AMBULATORY_CARE_PROVIDER_SITE_OTHER): Payer: Medicare Other | Admitting: Obstetrics and Gynecology

## 2022-08-17 VITALS — BP 97/61 | HR 70

## 2022-08-17 DIAGNOSIS — N393 Stress incontinence (female) (male): Secondary | ICD-10-CM

## 2022-08-17 DIAGNOSIS — N3281 Overactive bladder: Secondary | ICD-10-CM

## 2022-08-17 NOTE — Progress Notes (Signed)
[facility] Return Visit  SUBJECTIVE  History of Present Illness: [name] is a 81 y.o. female seen in follow-up for mixed incontinence.   She is having some pain/ fullness in the pelvic area. But she denies pain with urination. Last visit we discussed options for both OAB and SUI. She feels that she has the most issues with not having control over her bladder. For SUI, we reviewed option of pessary and she wants to try today.    Past Medical History: Patient  has a past medical history of A-fib [facility]), Allergic rhinitis, Anxiety, Asthma, Cellulitis and abscess of left leg (01/2020), CHF (congestive heart failure) ([place]), Cholecystitis, Chronic abdominal pain, Colon polyp, Depression, Diverticulosis (2005), Fatty liver, Fibromyalgia, GERD (gastroesophageal reflux disease), Gout, Hyperlipemia, Hypothyroidism, Internal hemorrhoids, MCI (mild cognitive impairment) (12/30/2017), Migraine, Narcolepsy, Obesity, morbid (more than 100 lbs over ideal weight or BMI > 40) ([place]), Obstructive sleep apnea (on CPAP and O2), Osteoarthritis, Persistent atrial fibrillation ([place]), and Volume overload.   Past Surgical History: She  has a past surgical history that includes Abdominal hysterectomy (FIBROIDS 1982); Rhinoplasty; Bladder suspension (1982); Kidney Stent (1995); Cardioversion (02/05/2012); Cholecystectomy (09/03/2012); Colonoscopy (2005); Colonoscopy (June 2011); sphincterotomy (10/01/2012); removal of stones (10/01/2012); ERCP (10/01/2012); Cardioversion (N/A, 06/09/2013); Cardioversion (N/A, 08/09/2015); Cardioversion (N/A, 09/13/2015); Colonoscopy with propofol (N/A, 03/19/2016); Esophagogastroduodenoscopy (egd) with propofol (N/A, 03/19/2016); and polypectomy (03/19/2016).   Medications: She has a current medication list which includes the following prescription(s): allopurinol, d3, colchicine, eliquis, esomeprazole, ezetimibe, fluticasone, furosemide, hydrocodone-acetaminophen, levothyroxine,  loratadine, lorazepam, metoprolol succinate, miconazole, nystatin-triamcinolone ointment, polyethyl glycol-propyl glycol, sennosides-docusate sodium, and stiolto respimat.   Allergies: Patient is allergic to meperidine, atorvastatin, febuxostat, simvastatin, statins, and zocor [simvastatin - high dose].   Social History: Patient  reports that she quit smoking about 32 years ago. Her smoking use included cigarettes. She has a 15.00 pack-year smoking history. She has never used smokeless tobacco. She reports that she does not drink alcohol and does not use drugs.      OBJECTIVE     Physical Exam: Vitals:   08/17/22 1331  BP: 97/61  Pulse: 70   Gen: No apparent distress, A&O x 3.  Detailed Urogynecologic Evaluation:  Normal external genitalia. #1 incontinence ring placed. With standing, patient felt it move some, so this was removed. #2 incontinence ring placed. It was comfortable, fit well, and stayed in placed with strong cough, valsalva and bending.  Lot # [id number] Exp 05/03/26  Prior exam showed:  POP-Q:    POP-Q   -3                                            Aa   -3                                           Ba   -9                                              C    3  Gh   4                                            Pb   9                                            tvl    -1                                            Ap   -1                                            Bp                                                  D        ASSESSMENT AND PLAN    Ms. Navejas is a 81 y.o. with:  1. SUI (stress urinary incontinence, female)   2. Overactive bladder    SUI - #2 incontinence ring fitted today.  - She has also had urethral bulking with macroplastique in the past.   2. OAB - We reviewed the option of medication or PTNS. She does not want to start another medication.  - She will proceed with PTNS. She was given a  bladder diary to complete for 3 days prior to first session.   - Will check on pessary at first PTNS session.   Jaquita Folds, MD  Time spent: I spent 20 minutes dedicated to the care of this patient on the date of this encounter to include pre-visit review of records, face-to-face time with the patient  and post visit documentation. Additional time was spent on the pessary fitting.

## 2022-08-20 DIAGNOSIS — M47816 Spondylosis without myelopathy or radiculopathy, lumbar region: Secondary | ICD-10-CM | POA: Diagnosis not present

## 2022-08-20 DIAGNOSIS — M5459 Other low back pain: Secondary | ICD-10-CM | POA: Diagnosis not present

## 2022-08-20 NOTE — Progress Notes (Signed)
NO PA Needed for any Medicare in-office procedures

## 2022-08-28 DIAGNOSIS — R0789 Other chest pain: Secondary | ICD-10-CM | POA: Diagnosis not present

## 2022-08-28 DIAGNOSIS — R069 Unspecified abnormalities of breathing: Secondary | ICD-10-CM | POA: Diagnosis not present

## 2022-08-28 DIAGNOSIS — R079 Chest pain, unspecified: Secondary | ICD-10-CM | POA: Diagnosis not present

## 2022-08-29 DIAGNOSIS — R0989 Other specified symptoms and signs involving the circulatory and respiratory systems: Secondary | ICD-10-CM | POA: Diagnosis not present

## 2022-08-29 DIAGNOSIS — R0600 Dyspnea, unspecified: Secondary | ICD-10-CM | POA: Diagnosis not present

## 2022-08-29 DIAGNOSIS — I509 Heart failure, unspecified: Secondary | ICD-10-CM | POA: Diagnosis not present

## 2022-08-29 DIAGNOSIS — I517 Cardiomegaly: Secondary | ICD-10-CM | POA: Diagnosis not present

## 2022-08-29 DIAGNOSIS — R918 Other nonspecific abnormal finding of lung field: Secondary | ICD-10-CM | POA: Diagnosis not present

## 2022-08-29 DIAGNOSIS — Z20822 Contact with and (suspected) exposure to covid-19: Secondary | ICD-10-CM | POA: Diagnosis not present

## 2022-08-29 DIAGNOSIS — Z87891 Personal history of nicotine dependence: Secondary | ICD-10-CM | POA: Diagnosis not present

## 2022-09-06 DIAGNOSIS — R030 Elevated blood-pressure reading, without diagnosis of hypertension: Secondary | ICD-10-CM | POA: Diagnosis not present

## 2022-09-06 DIAGNOSIS — R0600 Dyspnea, unspecified: Secondary | ICD-10-CM | POA: Diagnosis not present

## 2022-09-06 DIAGNOSIS — F330 Major depressive disorder, recurrent, mild: Secondary | ICD-10-CM | POA: Diagnosis not present

## 2022-09-06 DIAGNOSIS — I480 Paroxysmal atrial fibrillation: Secondary | ICD-10-CM | POA: Diagnosis not present

## 2022-09-06 DIAGNOSIS — Z6841 Body Mass Index (BMI) 40.0 and over, adult: Secondary | ICD-10-CM | POA: Diagnosis not present

## 2022-09-06 DIAGNOSIS — I509 Heart failure, unspecified: Secondary | ICD-10-CM | POA: Diagnosis not present

## 2022-09-07 ENCOUNTER — Encounter: Payer: Self-pay | Admitting: Obstetrics and Gynecology

## 2022-09-07 ENCOUNTER — Ambulatory Visit (INDEPENDENT_AMBULATORY_CARE_PROVIDER_SITE_OTHER): Payer: Medicare Other | Admitting: Obstetrics and Gynecology

## 2022-09-07 VITALS — BP 99/66 | HR 76

## 2022-09-07 DIAGNOSIS — N3281 Overactive bladder: Secondary | ICD-10-CM

## 2022-09-07 DIAGNOSIS — N393 Stress incontinence (female) (male): Secondary | ICD-10-CM

## 2022-09-07 NOTE — Progress Notes (Signed)
[facility]  PTNS VISIT  CC:  Overactive bladder  81 y.o. with refractory overactive bladder who presents for percutaneous tibial nerve stimulation. The patient presents for PTNS session # 1. She has filled out her bladder diary and it was scanned.   Procedure: The patient was placed in the sitting position and the right lower extremity was prepped in the usual fashion. The PTNS needle was then inserted at a 60 degree angle, 5 cm cephalad and 2 cm posterior to the medial malleolus. The PTNS unit was then programmed and an optimal response was noted at 4 milliamps. The PTNS stimulation was then performed at this setting for 30 minutes without incident and the patient tolerated the procedure well. The needle was removed and hemostasis was noted.   The pt will return in 1 week for PTNS session # 2.

## 2022-09-07 NOTE — Progress Notes (Signed)
[facility] Return Visit  SUBJECTIVE  History of Present Illness: [name] is a 81 y.o. female seen in follow-up for mixed incontinence. She had a pessary at the last visit but she feels it is bothersome to her and she does not want it anymore. Scheduled for her first PTNS session today.     Past Medical History: Patient  has a past medical history of A-fib [facility]), Allergic rhinitis, Anxiety, Asthma, Cellulitis and abscess of left leg (01/2020), CHF (congestive heart failure) ([place]), Cholecystitis, Chronic abdominal pain, Colon polyp, Depression, Diverticulosis (2005), Fatty liver, Fibromyalgia, GERD (gastroesophageal reflux disease), Gout, Hyperlipemia, Hypothyroidism, Internal hemorrhoids, MCI (mild cognitive impairment) (12/30/2017), Migraine, Narcolepsy, Obesity, morbid (more than 100 lbs over ideal weight or BMI > 40) ([place]), Obstructive sleep apnea (on CPAP and O2), Osteoarthritis, Persistent atrial fibrillation ([place]), and Volume overload.   Past Surgical History: She  has a past surgical history that includes Abdominal hysterectomy (FIBROIDS 1982); Rhinoplasty; Bladder suspension (1982); Kidney Stent (1995); Cardioversion (02/05/2012); Cholecystectomy (09/03/2012); Colonoscopy (2005); Colonoscopy (June 2011); sphincterotomy (10/01/2012); removal of stones (10/01/2012); ERCP (10/01/2012); Cardioversion (N/A, 06/09/2013); Cardioversion (N/A, 08/09/2015); Cardioversion (N/A, 09/13/2015); Colonoscopy with propofol (N/A, 03/19/2016); Esophagogastroduodenoscopy (egd) with propofol (N/A, 03/19/2016); and polypectomy (03/19/2016).   Medications: She has a current medication list which includes the following prescription(s): allopurinol, d3, colchicine, eliquis, esomeprazole, ezetimibe, fluticasone, furosemide, hydrocodone-acetaminophen, levothyroxine, loratadine, lorazepam, metoprolol succinate, miconazole, nystatin-triamcinolone ointment, polyethyl glycol-propyl glycol, sennosides-docusate sodium,  and stiolto respimat.   Allergies: Patient is allergic to meperidine, atorvastatin, febuxostat, simvastatin, statins, and zocor [simvastatin - high dose].   Social History: Patient  reports that she quit smoking about 32 years ago. Her smoking use included cigarettes. She has a 15.00 pack-year smoking history. She has never used smokeless tobacco. She reports that she does not drink alcohol and does not use drugs.      OBJECTIVE     Physical Exam: Vitals:   09/07/22 1037  BP: 99/66  Pulse: 76   Gen: No apparent distress, A&O x 3.  Detailed Urogynecologic Evaluation:  Normal external genitalia. Pessary removed and discarded. Speculum exam shows normal mucosa with no lesions.     ASSESSMENT AND PLAN    [name] is a 81 y.o. with:  1. SUI (stress urinary incontinence, female)    - Does not want another pessary at this time. Will focus on OAB treatment with PTNS.   Jaquita Folds, MD  Time spent: I spent 20 minutes dedicated to the care of this patient on the date of this encounter to include pre-visit review of records, face-to-face time with the patient and post visit documentation.

## 2022-09-14 ENCOUNTER — Encounter: Payer: Self-pay | Admitting: Obstetrics and Gynecology

## 2022-09-14 ENCOUNTER — Ambulatory Visit (INDEPENDENT_AMBULATORY_CARE_PROVIDER_SITE_OTHER): Payer: Medicare Other | Admitting: Obstetrics and Gynecology

## 2022-09-14 VITALS — BP 118/65 | HR 69

## 2022-09-14 DIAGNOSIS — N3281 Overactive bladder: Secondary | ICD-10-CM | POA: Diagnosis not present

## 2022-09-14 NOTE — Progress Notes (Signed)
[facility]  PTNS VISIT  CC:  Overactive bladder  81 y.o. with refractory overactive bladder who presents for percutaneous tibial nerve stimulation. The patient presents for PTNS session # 2. She has filled out her bladder diary and it was scanned.   Procedure: The patient was placed in the sitting position and the right lower extremity was prepped in the usual fashion. The PTNS needle was then inserted at a 60 degree angle, 5 cm cephalad and 2 cm posterior to the medial malleolus. The PTNS unit was then programmed and an optimal response was noted at 3 milliamps. The PTNS stimulation was then performed at this setting for 30 minutes without incident and the patient tolerated the procedure well. The needle was removed and hemostasis was noted.   The pt will return in 1 week for PTNS session # 3.

## 2022-09-20 NOTE — Progress Notes (Signed)
 Urogynecology  PTNS VISIT  CC:  Overactive bladder  81 y.o. with refractory overactive bladder who presents for percutaneous tibial nerve stimulation. The patient presents for PTNS session # 3.   Procedure: The patient was placed in the sitting position and the left lower extremity was prepped in the usual fashion. The PTNS needle was then inserted at a 60 degree angle, 5 cm cephalad and 2 cm posterior to the medial malleolus. The PTNS unit was then programmed and an optimal response was noted at 3 milliamps. The PTNS stimulation was then performed at this setting for 30 minutes without incident and the patient tolerated the procedure well. The needle was removed and hemostasis was noted.   The pt will return in 1 week for PTNS session # 4. All questions were answered.

## 2022-09-21 ENCOUNTER — Ambulatory Visit (INDEPENDENT_AMBULATORY_CARE_PROVIDER_SITE_OTHER): Payer: Medicare Other | Admitting: Obstetrics and Gynecology

## 2022-09-21 ENCOUNTER — Encounter: Payer: Self-pay | Admitting: Obstetrics and Gynecology

## 2022-09-21 VITALS — BP 99/62 | HR 62

## 2022-09-21 DIAGNOSIS — N3281 Overactive bladder: Secondary | ICD-10-CM | POA: Diagnosis not present

## 2022-09-24 DIAGNOSIS — R040 Epistaxis: Secondary | ICD-10-CM | POA: Diagnosis not present

## 2022-09-24 DIAGNOSIS — Z87891 Personal history of nicotine dependence: Secondary | ICD-10-CM | POA: Diagnosis not present

## 2022-09-24 DIAGNOSIS — Z888 Allergy status to other drugs, medicaments and biological substances status: Secondary | ICD-10-CM | POA: Diagnosis not present

## 2022-09-26 NOTE — Progress Notes (Unsigned)
 Urogynecology  PTNS VISIT  CC:  Overactive bladder  81 y.o. with refractory overactive bladder who presents for percutaneous tibial nerve stimulation. The patient presents for PTNS session # 4.   Procedure: The patient was placed in the sitting position and the left lower extremity was prepped in the usual fashion. The PTNS needle was then inserted at a 60 degree angle, 5 cm cephalad and 2 cm posterior to the medial malleolus. The PTNS unit was then programmed and an optimal response was noted at 7 milliamps. The PTNS stimulation was then performed at this setting for 30 minutes without incident and the patient tolerated the procedure well. The needle was removed and hemostasis was noted.   The pt will return in 1 week for PTNS session # 5. All questions were answered.

## 2022-09-27 ENCOUNTER — Encounter: Payer: Self-pay | Admitting: Obstetrics and Gynecology

## 2022-09-27 ENCOUNTER — Ambulatory Visit (INDEPENDENT_AMBULATORY_CARE_PROVIDER_SITE_OTHER): Payer: Medicare Other | Admitting: Obstetrics and Gynecology

## 2022-09-27 VITALS — BP 100/45 | HR 81

## 2022-09-27 DIAGNOSIS — N3281 Overactive bladder: Secondary | ICD-10-CM | POA: Diagnosis not present

## 2022-09-27 DIAGNOSIS — J309 Allergic rhinitis, unspecified: Secondary | ICD-10-CM | POA: Diagnosis not present

## 2022-09-27 DIAGNOSIS — N393 Stress incontinence (female) (male): Secondary | ICD-10-CM

## 2022-09-27 DIAGNOSIS — R040 Epistaxis: Secondary | ICD-10-CM | POA: Diagnosis not present

## 2022-09-27 NOTE — Patient Instructions (Signed)
It was nice to see you today and I look forward to seeing you next week! I hope all goes well at the ENT and getting the packing removed.

## 2022-09-28 ENCOUNTER — Ambulatory Visit: Payer: Medicare Other | Admitting: Obstetrics and Gynecology

## 2022-10-01 DIAGNOSIS — R0602 Shortness of breath: Secondary | ICD-10-CM | POA: Diagnosis not present

## 2022-10-01 DIAGNOSIS — G4733 Obstructive sleep apnea (adult) (pediatric): Secondary | ICD-10-CM | POA: Diagnosis not present

## 2022-10-01 DIAGNOSIS — I480 Paroxysmal atrial fibrillation: Secondary | ICD-10-CM | POA: Diagnosis not present

## 2022-10-01 NOTE — Progress Notes (Signed)
[facility]  PTNS VISIT  CC:  Overactive bladder  81 y.o. with refractory overactive bladder who presents for percutaneous tibial nerve stimulation. The patient presents for PTNS session # 5.   Procedure: The patient was placed in the sitting position and the left lower extremity was prepped in the usual fashion. The PTNS needle was then inserted at a 60 degree angle, 5 cm cephalad and 2 cm posterior to the medial malleolus. The PTNS unit was then programmed and an optimal response was noted at 4 milliamps. The PTNS stimulation was then performed at this setting for 30 minutes without incident and the patient tolerated the procedure well. The needle was removed and hemostasis was noted.   Patient is considering ordering a Purewick at nighttime for her urinary incontinence as she has had her lasix increased by cardiology. Patient does report she feels like she is going less frequently to the bathroom since starting PTNS. Patient is concerned she is not going as frequently or emptying as much as she should. Patient given a new bladder diary and a small sticky note with the range of urine she should be producing a day. We will evaluate how much urine she is producing at her next PTNS.      She also reports she is having some pain in her bladder of unknown cause that improves when she changes her pad or urinates.   The pt will return in 1 week for PTNS session # 6. All questions were answered.

## 2022-10-05 ENCOUNTER — Ambulatory Visit (INDEPENDENT_AMBULATORY_CARE_PROVIDER_SITE_OTHER): Payer: Medicare Other | Admitting: Obstetrics and Gynecology

## 2022-10-05 ENCOUNTER — Encounter: Payer: Self-pay | Admitting: Obstetrics and Gynecology

## 2022-10-05 VITALS — BP 96/51 | HR 72 | Ht 62.0 in | Wt 289.0 lb

## 2022-10-05 DIAGNOSIS — R350 Frequency of micturition: Secondary | ICD-10-CM

## 2022-10-05 DIAGNOSIS — N3281 Overactive bladder: Secondary | ICD-10-CM

## 2022-10-09 NOTE — Progress Notes (Signed)
[facility]  PTNS VISIT  CC:  Overactive bladder  81 y.o. with refractory overactive bladder who presents for percutaneous tibial nerve stimulation. The patient presents for PTNS session # 6.   Procedure: The patient was placed in the sitting position and the left lower extremity was prepped in the usual fashion. The PTNS needle was then inserted at a 60 degree angle, 5 cm cephalad and 2 cm posterior to the medial malleolus. The PTNS unit was then programmed and an optimal response was noted at 4 milliamps. The PTNS stimulation was then performed at this setting for 30 minutes without incident and the patient tolerated the procedure well. The needle was removed and hemostasis was noted.   The pt will return in 1 week for PTNS session # 7. All questions were answered.

## 2022-10-12 ENCOUNTER — Encounter: Payer: Self-pay | Admitting: Obstetrics and Gynecology

## 2022-10-12 ENCOUNTER — Ambulatory Visit (INDEPENDENT_AMBULATORY_CARE_PROVIDER_SITE_OTHER): Payer: Medicare Other | Admitting: Obstetrics and Gynecology

## 2022-10-12 VITALS — BP 121/57 | HR 59

## 2022-10-12 DIAGNOSIS — N3281 Overactive bladder: Secondary | ICD-10-CM

## 2022-10-17 DIAGNOSIS — R0602 Shortness of breath: Secondary | ICD-10-CM | POA: Diagnosis not present

## 2022-10-23 DIAGNOSIS — I509 Heart failure, unspecified: Secondary | ICD-10-CM | POA: Diagnosis not present

## 2022-10-23 DIAGNOSIS — I480 Paroxysmal atrial fibrillation: Secondary | ICD-10-CM | POA: Diagnosis not present

## 2022-10-23 DIAGNOSIS — R350 Frequency of micturition: Secondary | ICD-10-CM | POA: Diagnosis not present

## 2022-10-23 DIAGNOSIS — R030 Elevated blood-pressure reading, without diagnosis of hypertension: Secondary | ICD-10-CM | POA: Diagnosis not present

## 2022-10-23 DIAGNOSIS — I482 Chronic atrial fibrillation, unspecified: Secondary | ICD-10-CM | POA: Diagnosis not present

## 2022-10-23 DIAGNOSIS — E039 Hypothyroidism, unspecified: Secondary | ICD-10-CM | POA: Diagnosis not present

## 2022-10-23 DIAGNOSIS — F330 Major depressive disorder, recurrent, mild: Secondary | ICD-10-CM | POA: Diagnosis not present

## 2022-10-23 DIAGNOSIS — R0600 Dyspnea, unspecified: Secondary | ICD-10-CM | POA: Diagnosis not present

## 2022-10-23 DIAGNOSIS — Z6841 Body Mass Index (BMI) 40.0 and over, adult: Secondary | ICD-10-CM | POA: Diagnosis not present

## 2022-10-25 NOTE — Progress Notes (Signed)
[facility]  PTNS VISIT  CC:  Overactive bladder  80 y.o. with refractory overactive bladder who presents for percutaneous tibial nerve stimulation. The patient presents for PTNS session # 7.  She reports some decrease in urgency and she is now using pure wick at nighttime. She is now on lasix which impacts her frequency but reports she is not urinating on herself at much.    Procedure: The patient was placed in the sitting position and the left lower extremity was prepped in the usual fashion. The PTNS needle was then inserted at a 60 degree angle, 5 cm cephalad and 2 cm posterior to the medial malleolus. The PTNS unit was then programmed and an optimal response was noted at 3 milliamps. The PTNS stimulation was then performed at this setting for 30 minutes without incident and the patient tolerated the procedure well. The needle was removed and hemostasis was noted.     Patient given hat and reports she has a bladder diary at home she can fill out. Patient's daughter and she report they will do their best to track a few days during this next week.   The pt will return in 1 week for PTNS session # 8. All questions were answered.

## 2022-10-26 ENCOUNTER — Ambulatory Visit (INDEPENDENT_AMBULATORY_CARE_PROVIDER_SITE_OTHER): Payer: Medicare Other | Admitting: Obstetrics and Gynecology

## 2022-10-26 ENCOUNTER — Encounter: Payer: Self-pay | Admitting: Obstetrics and Gynecology

## 2022-10-26 VITALS — BP 106/69 | HR 64 | Wt 290.0 lb

## 2022-10-26 DIAGNOSIS — N3281 Overactive bladder: Secondary | ICD-10-CM

## 2022-10-26 DIAGNOSIS — N393 Stress incontinence (female) (male): Secondary | ICD-10-CM

## 2022-11-01 DIAGNOSIS — R0789 Other chest pain: Secondary | ICD-10-CM | POA: Diagnosis not present

## 2022-11-01 DIAGNOSIS — R0602 Shortness of breath: Secondary | ICD-10-CM | POA: Diagnosis not present

## 2022-11-01 DIAGNOSIS — G4733 Obstructive sleep apnea (adult) (pediatric): Secondary | ICD-10-CM | POA: Diagnosis not present

## 2022-11-01 DIAGNOSIS — I480 Paroxysmal atrial fibrillation: Secondary | ICD-10-CM | POA: Diagnosis not present

## 2022-11-02 ENCOUNTER — Encounter: Payer: Self-pay | Admitting: Obstetrics and Gynecology

## 2022-11-02 ENCOUNTER — Ambulatory Visit (INDEPENDENT_AMBULATORY_CARE_PROVIDER_SITE_OTHER): Payer: Medicare Other | Admitting: Obstetrics and Gynecology

## 2022-11-02 VITALS — BP 99/82 | HR 82

## 2022-11-02 DIAGNOSIS — N816 Rectocele: Secondary | ICD-10-CM | POA: Diagnosis not present

## 2022-11-02 DIAGNOSIS — N3281 Overactive bladder: Secondary | ICD-10-CM | POA: Diagnosis not present

## 2022-11-02 DIAGNOSIS — N393 Stress incontinence (female) (male): Secondary | ICD-10-CM

## 2022-11-02 NOTE — Progress Notes (Signed)
[facility] Return Visit  SUBJECTIVE  History of Present Illness: LABERTA WILBON is a 81 y.o. female seen in follow-up for planned PTNS.   Patient completed a bladder diary and has been having small amounts of urine. She reports she is also on an antibiotic for a UTI. She was seen by cardiology yesterday and they want her to do Lasix '40mg'$  daily. She is using a purewick at night.   Patient has been referred to pulmonology due to breathing issues.     Past Medical History: Patient  has a past medical history of A-fib [facility]), Allergic rhinitis, Anxiety, Asthma, Cellulitis and abscess of left leg (01/2020), CHF (congestive heart failure) ([place]), Cholecystitis, Chronic abdominal pain, Colon polyp, Depression, Diverticulosis (2005), Fatty liver, Fibromyalgia, GERD (gastroesophageal reflux disease), Gout, Hyperlipemia, Hypothyroidism, Internal hemorrhoids, MCI (mild cognitive impairment) (12/30/2017), Migraine, Narcolepsy, Obesity, morbid (more than 100 lbs over ideal weight or BMI > 40) ([place]), Obstructive sleep apnea (on CPAP and O2), Osteoarthritis, Persistent atrial fibrillation ([place]), and Volume overload.   Past Surgical History: She  has a past surgical history that includes Abdominal hysterectomy (FIBROIDS 1982); Rhinoplasty; Bladder suspension (1982); Kidney Stent (1995); Cardioversion (02/05/2012); Cholecystectomy (09/03/2012); Colonoscopy (2005); Colonoscopy (June 2011); sphincterotomy (10/01/2012); removal of stones (10/01/2012); ERCP (10/01/2012); Cardioversion (N/A, 06/09/2013); Cardioversion (N/A, 08/09/2015); Cardioversion (N/A, 09/13/2015); Colonoscopy with propofol (N/A, 03/19/2016); Esophagogastroduodenoscopy (egd) with propofol (N/A, 03/19/2016); and polypectomy (03/19/2016).   Medications: She has a current medication list which includes the following prescription(s): sulfamethoxazole-trimethoprim, allopurinol, d3, colchicine, eliquis, esomeprazole, ezetimibe, furosemide,  hydrocodone-acetaminophen, levothyroxine, loratadine, lorazepam, metoprolol succinate, miconazole, nystatin-triamcinolone ointment, polyethyl glycol-propyl glycol, sennosides-docusate sodium, and stiolto respimat.   Allergies: Patient is allergic to meperidine, atorvastatin, febuxostat, simvastatin, statins, and zocor [simvastatin - high dose].   Social History: Patient  reports that she quit smoking about 33 years ago. Her smoking use included cigarettes. She has a 15.00 pack-year smoking history. She has never used smokeless tobacco. She reports that she does not drink alcohol and does not use drugs.      OBJECTIVE     Physical Exam: Vitals:   11/02/22 1011  BP: 99/82  Pulse: 82   Gen: No apparent distress, A&O x 3.  Detailed Urogynecologic Evaluation:  Deferred.     ASSESSMENT AND PLAN    Ms. Stoneberg is a 81 y.o. with:  1. Overactive bladder   2. SUI (stress urinary incontinence, female)   3. Prolapse of posterior vaginal wall    1. Patient's overactive bladder has been difficult to treat and assess due to lasix use. PTNS was thought to have been helping but when she brought her repeat bladder diary in, there was still significant amounts of leakage and urgency. Today was to have been her 8th visit, but using shared decision making we determined not to continue with PTNS at this time.  2. Patient has previously had [employer] urethral injections done which makes her less likely to pursue bulkamid. Also not a good surgical candidate for sling.  3. Not bothersome to her at this time.   At this point in patient's care we discussed foregoing further treatment at this time. We want the best quality of life for her and she needs to focus on her heart and lung issues at this time. If her symptoms worsen and she would like to attempt to resume therapy or another treatment she reports she will call and do so.   Follow up PRN

## 2022-11-06 DIAGNOSIS — M5459 Other low back pain: Secondary | ICD-10-CM | POA: Diagnosis not present

## 2022-11-06 DIAGNOSIS — Z6841 Body Mass Index (BMI) 40.0 and over, adult: Secondary | ICD-10-CM | POA: Diagnosis not present

## 2022-11-06 DIAGNOSIS — M47816 Spondylosis without myelopathy or radiculopathy, lumbar region: Secondary | ICD-10-CM | POA: Diagnosis not present

## 2022-11-06 DIAGNOSIS — M5136 Other intervertebral disc degeneration, lumbar region: Secondary | ICD-10-CM | POA: Diagnosis not present

## 2022-11-09 ENCOUNTER — Ambulatory Visit: Payer: Medicare Other | Admitting: Obstetrics and Gynecology

## 2022-11-13 DIAGNOSIS — J069 Acute upper respiratory infection, unspecified: Secondary | ICD-10-CM | POA: Diagnosis not present

## 2022-11-13 DIAGNOSIS — B370 Candidal stomatitis: Secondary | ICD-10-CM | POA: Diagnosis not present

## 2022-11-16 ENCOUNTER — Ambulatory Visit: Payer: Medicare Other | Admitting: Obstetrics and Gynecology

## 2022-11-22 DIAGNOSIS — R350 Frequency of micturition: Secondary | ICD-10-CM | POA: Diagnosis not present

## 2022-11-22 DIAGNOSIS — R300 Dysuria: Secondary | ICD-10-CM | POA: Diagnosis not present

## 2022-11-23 ENCOUNTER — Ambulatory Visit: Payer: Medicare Other | Admitting: Obstetrics and Gynecology

## 2022-11-30 ENCOUNTER — Ambulatory Visit: Payer: Medicare Other | Admitting: Obstetrics and Gynecology

## 2022-12-19 ENCOUNTER — Other Ambulatory Visit: Payer: Self-pay | Admitting: Cardiology

## 2022-12-21 ENCOUNTER — Ambulatory Visit: Payer: Medicare Other | Admitting: Obstetrics and Gynecology

## 2022-12-26 DIAGNOSIS — M5459 Other low back pain: Secondary | ICD-10-CM | POA: Diagnosis not present

## 2022-12-26 DIAGNOSIS — M47816 Spondylosis without myelopathy or radiculopathy, lumbar region: Secondary | ICD-10-CM | POA: Diagnosis not present

## 2023-01-07 DIAGNOSIS — M47816 Spondylosis without myelopathy or radiculopathy, lumbar region: Secondary | ICD-10-CM | POA: Diagnosis not present

## 2023-01-07 DIAGNOSIS — G894 Chronic pain syndrome: Secondary | ICD-10-CM | POA: Diagnosis not present

## 2023-01-07 DIAGNOSIS — M5459 Other low back pain: Secondary | ICD-10-CM | POA: Diagnosis not present

## 2023-01-07 DIAGNOSIS — Z6841 Body Mass Index (BMI) 40.0 and over, adult: Secondary | ICD-10-CM | POA: Diagnosis not present

## 2023-01-07 DIAGNOSIS — Z1330 Encounter for screening examination for mental health and behavioral disorders, unspecified: Secondary | ICD-10-CM | POA: Diagnosis not present

## 2023-01-07 DIAGNOSIS — Z79891 Long term (current) use of opiate analgesic: Secondary | ICD-10-CM | POA: Diagnosis not present

## 2023-01-14 ENCOUNTER — Encounter: Payer: Self-pay | Admitting: *Deleted

## 2023-02-06 DIAGNOSIS — M47816 Spondylosis without myelopathy or radiculopathy, lumbar region: Secondary | ICD-10-CM | POA: Diagnosis not present

## 2023-02-06 DIAGNOSIS — M5459 Other low back pain: Secondary | ICD-10-CM | POA: Diagnosis not present

## 2023-02-15 DIAGNOSIS — I482 Chronic atrial fibrillation, unspecified: Secondary | ICD-10-CM | POA: Diagnosis not present

## 2023-02-15 DIAGNOSIS — R0600 Dyspnea, unspecified: Secondary | ICD-10-CM | POA: Diagnosis not present

## 2023-02-15 DIAGNOSIS — F330 Major depressive disorder, recurrent, mild: Secondary | ICD-10-CM | POA: Diagnosis not present

## 2023-02-15 DIAGNOSIS — M109 Gout, unspecified: Secondary | ICD-10-CM | POA: Diagnosis not present

## 2023-02-15 DIAGNOSIS — E039 Hypothyroidism, unspecified: Secondary | ICD-10-CM | POA: Diagnosis not present

## 2023-02-15 DIAGNOSIS — R030 Elevated blood-pressure reading, without diagnosis of hypertension: Secondary | ICD-10-CM | POA: Diagnosis not present

## 2023-02-15 DIAGNOSIS — R350 Frequency of micturition: Secondary | ICD-10-CM | POA: Diagnosis not present

## 2023-02-18 DIAGNOSIS — M47816 Spondylosis without myelopathy or radiculopathy, lumbar region: Secondary | ICD-10-CM | POA: Diagnosis not present

## 2023-02-18 DIAGNOSIS — M5459 Other low back pain: Secondary | ICD-10-CM | POA: Diagnosis not present

## 2023-02-18 DIAGNOSIS — G894 Chronic pain syndrome: Secondary | ICD-10-CM | POA: Diagnosis not present

## 2023-02-18 DIAGNOSIS — Z6841 Body Mass Index (BMI) 40.0 and over, adult: Secondary | ICD-10-CM | POA: Diagnosis not present

## 2023-02-18 DIAGNOSIS — Z79891 Long term (current) use of opiate analgesic: Secondary | ICD-10-CM | POA: Diagnosis not present

## 2023-02-18 DIAGNOSIS — M5136 Other intervertebral disc degeneration, lumbar region: Secondary | ICD-10-CM | POA: Diagnosis not present

## 2023-03-10 DIAGNOSIS — K449 Diaphragmatic hernia without obstruction or gangrene: Secondary | ICD-10-CM | POA: Diagnosis not present

## 2023-03-10 DIAGNOSIS — Z7901 Long term (current) use of anticoagulants: Secondary | ICD-10-CM | POA: Diagnosis not present

## 2023-03-10 DIAGNOSIS — Z87891 Personal history of nicotine dependence: Secondary | ICD-10-CM | POA: Diagnosis not present

## 2023-03-10 DIAGNOSIS — K5732 Diverticulitis of large intestine without perforation or abscess without bleeding: Secondary | ICD-10-CM | POA: Diagnosis not present

## 2023-03-10 DIAGNOSIS — N390 Urinary tract infection, site not specified: Secondary | ICD-10-CM | POA: Diagnosis not present

## 2023-03-10 DIAGNOSIS — Z79899 Other long term (current) drug therapy: Secondary | ICD-10-CM | POA: Diagnosis not present

## 2023-03-10 DIAGNOSIS — I509 Heart failure, unspecified: Secondary | ICD-10-CM | POA: Diagnosis not present

## 2023-03-10 DIAGNOSIS — E079 Disorder of thyroid, unspecified: Secondary | ICD-10-CM | POA: Diagnosis not present

## 2023-03-10 DIAGNOSIS — Z888 Allergy status to other drugs, medicaments and biological substances status: Secondary | ICD-10-CM | POA: Diagnosis not present

## 2023-03-10 DIAGNOSIS — K5792 Diverticulitis of intestine, part unspecified, without perforation or abscess without bleeding: Secondary | ICD-10-CM | POA: Diagnosis not present

## 2023-03-10 DIAGNOSIS — N200 Calculus of kidney: Secondary | ICD-10-CM | POA: Diagnosis not present

## 2023-03-10 DIAGNOSIS — Z9049 Acquired absence of other specified parts of digestive tract: Secondary | ICD-10-CM | POA: Diagnosis not present

## 2023-03-10 DIAGNOSIS — Z79890 Hormone replacement therapy (postmenopausal): Secondary | ICD-10-CM | POA: Diagnosis not present

## 2023-03-10 DIAGNOSIS — M1990 Unspecified osteoarthritis, unspecified site: Secondary | ICD-10-CM | POA: Diagnosis not present

## 2023-03-10 DIAGNOSIS — I4891 Unspecified atrial fibrillation: Secondary | ICD-10-CM | POA: Diagnosis not present

## 2023-03-19 DIAGNOSIS — R030 Elevated blood-pressure reading, without diagnosis of hypertension: Secondary | ICD-10-CM | POA: Diagnosis not present

## 2023-03-19 DIAGNOSIS — N390 Urinary tract infection, site not specified: Secondary | ICD-10-CM | POA: Diagnosis not present

## 2023-03-19 DIAGNOSIS — N200 Calculus of kidney: Secondary | ICD-10-CM | POA: Diagnosis not present

## 2023-03-19 DIAGNOSIS — K5792 Diverticulitis of intestine, part unspecified, without perforation or abscess without bleeding: Secondary | ICD-10-CM | POA: Diagnosis not present

## 2023-04-23 DIAGNOSIS — G4733 Obstructive sleep apnea (adult) (pediatric): Secondary | ICD-10-CM | POA: Diagnosis not present

## 2023-04-23 DIAGNOSIS — J449 Chronic obstructive pulmonary disease, unspecified: Secondary | ICD-10-CM | POA: Diagnosis not present

## 2023-04-23 DIAGNOSIS — J309 Allergic rhinitis, unspecified: Secondary | ICD-10-CM | POA: Diagnosis not present

## 2023-04-26 DIAGNOSIS — Z87891 Personal history of nicotine dependence: Secondary | ICD-10-CM | POA: Diagnosis not present

## 2023-04-26 DIAGNOSIS — Z791 Long term (current) use of non-steroidal anti-inflammatories (NSAID): Secondary | ICD-10-CM | POA: Diagnosis not present

## 2023-04-26 DIAGNOSIS — R109 Unspecified abdominal pain: Secondary | ICD-10-CM | POA: Diagnosis not present

## 2023-04-26 DIAGNOSIS — J90 Pleural effusion, not elsewhere classified: Secondary | ICD-10-CM | POA: Diagnosis not present

## 2023-04-26 DIAGNOSIS — D649 Anemia, unspecified: Secondary | ICD-10-CM | POA: Diagnosis not present

## 2023-04-26 DIAGNOSIS — Z7409 Other reduced mobility: Secondary | ICD-10-CM | POA: Diagnosis not present

## 2023-04-26 DIAGNOSIS — Z6841 Body Mass Index (BMI) 40.0 and over, adult: Secondary | ICD-10-CM | POA: Diagnosis not present

## 2023-04-26 DIAGNOSIS — I081 Rheumatic disorders of both mitral and tricuspid valves: Secondary | ICD-10-CM | POA: Diagnosis not present

## 2023-04-26 DIAGNOSIS — Z79890 Hormone replacement therapy (postmenopausal): Secondary | ICD-10-CM | POA: Diagnosis not present

## 2023-04-26 DIAGNOSIS — J9811 Atelectasis: Secondary | ICD-10-CM | POA: Diagnosis not present

## 2023-04-26 DIAGNOSIS — I4891 Unspecified atrial fibrillation: Secondary | ICD-10-CM | POA: Diagnosis not present

## 2023-04-26 DIAGNOSIS — E662 Morbid (severe) obesity with alveolar hypoventilation: Secondary | ICD-10-CM | POA: Diagnosis not present

## 2023-04-26 DIAGNOSIS — Z885 Allergy status to narcotic agent status: Secondary | ICD-10-CM | POA: Diagnosis not present

## 2023-04-26 DIAGNOSIS — Z90710 Acquired absence of both cervix and uterus: Secondary | ICD-10-CM | POA: Diagnosis not present

## 2023-04-26 DIAGNOSIS — I5033 Acute on chronic diastolic (congestive) heart failure: Secondary | ICD-10-CM | POA: Diagnosis not present

## 2023-04-26 DIAGNOSIS — I482 Chronic atrial fibrillation, unspecified: Secondary | ICD-10-CM | POA: Diagnosis not present

## 2023-04-26 DIAGNOSIS — M109 Gout, unspecified: Secondary | ICD-10-CM | POA: Diagnosis not present

## 2023-04-26 DIAGNOSIS — Z9049 Acquired absence of other specified parts of digestive tract: Secondary | ICD-10-CM | POA: Diagnosis not present

## 2023-04-26 DIAGNOSIS — I5021 Acute systolic (congestive) heart failure: Secondary | ICD-10-CM | POA: Diagnosis not present

## 2023-04-26 DIAGNOSIS — R911 Solitary pulmonary nodule: Secondary | ICD-10-CM | POA: Diagnosis not present

## 2023-04-26 DIAGNOSIS — R000 Tachycardia, unspecified: Secondary | ICD-10-CM | POA: Diagnosis not present

## 2023-04-26 DIAGNOSIS — F419 Anxiety disorder, unspecified: Secondary | ICD-10-CM | POA: Diagnosis not present

## 2023-04-26 DIAGNOSIS — J449 Chronic obstructive pulmonary disease, unspecified: Secondary | ICD-10-CM | POA: Diagnosis not present

## 2023-04-26 DIAGNOSIS — Z7901 Long term (current) use of anticoagulants: Secondary | ICD-10-CM | POA: Diagnosis not present

## 2023-04-26 DIAGNOSIS — R0602 Shortness of breath: Secondary | ICD-10-CM | POA: Diagnosis not present

## 2023-04-26 DIAGNOSIS — Z79899 Other long term (current) drug therapy: Secondary | ICD-10-CM | POA: Diagnosis not present

## 2023-04-26 DIAGNOSIS — Z888 Allergy status to other drugs, medicaments and biological substances status: Secondary | ICD-10-CM | POA: Diagnosis not present

## 2023-04-26 DIAGNOSIS — I517 Cardiomegaly: Secondary | ICD-10-CM | POA: Diagnosis not present

## 2023-04-26 DIAGNOSIS — K219 Gastro-esophageal reflux disease without esophagitis: Secondary | ICD-10-CM | POA: Diagnosis not present

## 2023-05-01 DIAGNOSIS — I482 Chronic atrial fibrillation, unspecified: Secondary | ICD-10-CM | POA: Diagnosis not present

## 2023-05-01 DIAGNOSIS — R32 Unspecified urinary incontinence: Secondary | ICD-10-CM | POA: Diagnosis not present

## 2023-05-01 DIAGNOSIS — Z79891 Long term (current) use of opiate analgesic: Secondary | ICD-10-CM | POA: Diagnosis not present

## 2023-05-01 DIAGNOSIS — M1990 Unspecified osteoarthritis, unspecified site: Secondary | ICD-10-CM | POA: Diagnosis not present

## 2023-05-01 DIAGNOSIS — K5790 Diverticulosis of intestine, part unspecified, without perforation or abscess without bleeding: Secondary | ICD-10-CM | POA: Diagnosis not present

## 2023-05-01 DIAGNOSIS — Z556 Problems related to health literacy: Secondary | ICD-10-CM | POA: Diagnosis not present

## 2023-05-01 DIAGNOSIS — Z7951 Long term (current) use of inhaled steroids: Secondary | ICD-10-CM | POA: Diagnosis not present

## 2023-05-01 DIAGNOSIS — I5032 Chronic diastolic (congestive) heart failure: Secondary | ICD-10-CM | POA: Diagnosis not present

## 2023-05-01 DIAGNOSIS — Z86010 Personal history of colonic polyps: Secondary | ICD-10-CM | POA: Diagnosis not present

## 2023-05-01 DIAGNOSIS — M109 Gout, unspecified: Secondary | ICD-10-CM | POA: Diagnosis not present

## 2023-05-01 DIAGNOSIS — E7800 Pure hypercholesterolemia, unspecified: Secondary | ICD-10-CM | POA: Diagnosis not present

## 2023-05-01 DIAGNOSIS — Z7901 Long term (current) use of anticoagulants: Secondary | ICD-10-CM | POA: Diagnosis not present

## 2023-05-01 DIAGNOSIS — J4489 Other specified chronic obstructive pulmonary disease: Secondary | ICD-10-CM | POA: Diagnosis not present

## 2023-05-01 DIAGNOSIS — K219 Gastro-esophageal reflux disease without esophagitis: Secondary | ICD-10-CM | POA: Diagnosis not present

## 2023-05-01 DIAGNOSIS — G4733 Obstructive sleep apnea (adult) (pediatric): Secondary | ICD-10-CM | POA: Diagnosis not present

## 2023-05-01 DIAGNOSIS — N200 Calculus of kidney: Secondary | ICD-10-CM | POA: Diagnosis not present

## 2023-05-01 DIAGNOSIS — F419 Anxiety disorder, unspecified: Secondary | ICD-10-CM | POA: Diagnosis not present

## 2023-05-01 DIAGNOSIS — Z6841 Body Mass Index (BMI) 40.0 and over, adult: Secondary | ICD-10-CM | POA: Diagnosis not present

## 2023-05-01 DIAGNOSIS — G43909 Migraine, unspecified, not intractable, without status migrainosus: Secondary | ICD-10-CM | POA: Diagnosis not present

## 2023-05-01 DIAGNOSIS — Z87891 Personal history of nicotine dependence: Secondary | ICD-10-CM | POA: Diagnosis not present

## 2023-05-01 DIAGNOSIS — Z9981 Dependence on supplemental oxygen: Secondary | ICD-10-CM | POA: Diagnosis not present

## 2023-05-03 DIAGNOSIS — F419 Anxiety disorder, unspecified: Secondary | ICD-10-CM | POA: Diagnosis not present

## 2023-05-03 DIAGNOSIS — M109 Gout, unspecified: Secondary | ICD-10-CM | POA: Diagnosis not present

## 2023-05-03 DIAGNOSIS — I5032 Chronic diastolic (congestive) heart failure: Secondary | ICD-10-CM | POA: Diagnosis not present

## 2023-05-03 DIAGNOSIS — I482 Chronic atrial fibrillation, unspecified: Secondary | ICD-10-CM | POA: Diagnosis not present

## 2023-05-03 DIAGNOSIS — M1990 Unspecified osteoarthritis, unspecified site: Secondary | ICD-10-CM | POA: Diagnosis not present

## 2023-05-03 DIAGNOSIS — J4489 Other specified chronic obstructive pulmonary disease: Secondary | ICD-10-CM | POA: Diagnosis not present

## 2023-05-04 DIAGNOSIS — R300 Dysuria: Secondary | ICD-10-CM | POA: Diagnosis not present

## 2023-05-06 DIAGNOSIS — I5032 Chronic diastolic (congestive) heart failure: Secondary | ICD-10-CM | POA: Diagnosis not present

## 2023-05-06 DIAGNOSIS — I4811 Longstanding persistent atrial fibrillation: Secondary | ICD-10-CM | POA: Diagnosis not present

## 2023-05-06 DIAGNOSIS — E785 Hyperlipidemia, unspecified: Secondary | ICD-10-CM | POA: Diagnosis not present

## 2023-05-07 DIAGNOSIS — I5032 Chronic diastolic (congestive) heart failure: Secondary | ICD-10-CM | POA: Diagnosis not present

## 2023-05-07 DIAGNOSIS — F419 Anxiety disorder, unspecified: Secondary | ICD-10-CM | POA: Diagnosis not present

## 2023-05-07 DIAGNOSIS — M1990 Unspecified osteoarthritis, unspecified site: Secondary | ICD-10-CM | POA: Diagnosis not present

## 2023-05-07 DIAGNOSIS — J4489 Other specified chronic obstructive pulmonary disease: Secondary | ICD-10-CM | POA: Diagnosis not present

## 2023-05-07 DIAGNOSIS — M109 Gout, unspecified: Secondary | ICD-10-CM | POA: Diagnosis not present

## 2023-05-07 DIAGNOSIS — I482 Chronic atrial fibrillation, unspecified: Secondary | ICD-10-CM | POA: Diagnosis not present

## 2023-05-10 DIAGNOSIS — I482 Chronic atrial fibrillation, unspecified: Secondary | ICD-10-CM | POA: Diagnosis not present

## 2023-05-10 DIAGNOSIS — Z6841 Body Mass Index (BMI) 40.0 and over, adult: Secondary | ICD-10-CM | POA: Diagnosis not present

## 2023-05-10 DIAGNOSIS — I5032 Chronic diastolic (congestive) heart failure: Secondary | ICD-10-CM | POA: Diagnosis not present

## 2023-05-10 DIAGNOSIS — F419 Anxiety disorder, unspecified: Secondary | ICD-10-CM | POA: Diagnosis not present

## 2023-05-10 DIAGNOSIS — N390 Urinary tract infection, site not specified: Secondary | ICD-10-CM | POA: Diagnosis not present

## 2023-05-10 DIAGNOSIS — I509 Heart failure, unspecified: Secondary | ICD-10-CM | POA: Diagnosis not present

## 2023-05-10 DIAGNOSIS — J449 Chronic obstructive pulmonary disease, unspecified: Secondary | ICD-10-CM | POA: Diagnosis not present

## 2023-05-10 DIAGNOSIS — Z1212 Encounter for screening for malignant neoplasm of rectum: Secondary | ICD-10-CM | POA: Diagnosis not present

## 2023-05-10 DIAGNOSIS — M1990 Unspecified osteoarthritis, unspecified site: Secondary | ICD-10-CM | POA: Diagnosis not present

## 2023-05-10 DIAGNOSIS — M109 Gout, unspecified: Secondary | ICD-10-CM | POA: Diagnosis not present

## 2023-05-10 DIAGNOSIS — J4489 Other specified chronic obstructive pulmonary disease: Secondary | ICD-10-CM | POA: Diagnosis not present

## 2023-05-13 DIAGNOSIS — M109 Gout, unspecified: Secondary | ICD-10-CM | POA: Diagnosis not present

## 2023-05-13 DIAGNOSIS — J4489 Other specified chronic obstructive pulmonary disease: Secondary | ICD-10-CM | POA: Diagnosis not present

## 2023-05-13 DIAGNOSIS — F419 Anxiety disorder, unspecified: Secondary | ICD-10-CM | POA: Diagnosis not present

## 2023-05-13 DIAGNOSIS — M1990 Unspecified osteoarthritis, unspecified site: Secondary | ICD-10-CM | POA: Diagnosis not present

## 2023-05-13 DIAGNOSIS — I5032 Chronic diastolic (congestive) heart failure: Secondary | ICD-10-CM | POA: Diagnosis not present

## 2023-05-13 DIAGNOSIS — I482 Chronic atrial fibrillation, unspecified: Secondary | ICD-10-CM | POA: Diagnosis not present

## 2023-05-14 DIAGNOSIS — I5032 Chronic diastolic (congestive) heart failure: Secondary | ICD-10-CM | POA: Diagnosis not present

## 2023-05-14 DIAGNOSIS — M1990 Unspecified osteoarthritis, unspecified site: Secondary | ICD-10-CM | POA: Diagnosis not present

## 2023-05-14 DIAGNOSIS — M109 Gout, unspecified: Secondary | ICD-10-CM | POA: Diagnosis not present

## 2023-05-14 DIAGNOSIS — I482 Chronic atrial fibrillation, unspecified: Secondary | ICD-10-CM | POA: Diagnosis not present

## 2023-05-14 DIAGNOSIS — F419 Anxiety disorder, unspecified: Secondary | ICD-10-CM | POA: Diagnosis not present

## 2023-05-14 DIAGNOSIS — J4489 Other specified chronic obstructive pulmonary disease: Secondary | ICD-10-CM | POA: Diagnosis not present

## 2023-05-15 DIAGNOSIS — F419 Anxiety disorder, unspecified: Secondary | ICD-10-CM | POA: Diagnosis not present

## 2023-05-15 DIAGNOSIS — I5032 Chronic diastolic (congestive) heart failure: Secondary | ICD-10-CM | POA: Diagnosis not present

## 2023-05-15 DIAGNOSIS — M1990 Unspecified osteoarthritis, unspecified site: Secondary | ICD-10-CM | POA: Diagnosis not present

## 2023-05-15 DIAGNOSIS — M109 Gout, unspecified: Secondary | ICD-10-CM | POA: Diagnosis not present

## 2023-05-15 DIAGNOSIS — J4489 Other specified chronic obstructive pulmonary disease: Secondary | ICD-10-CM | POA: Diagnosis not present

## 2023-05-15 DIAGNOSIS — I482 Chronic atrial fibrillation, unspecified: Secondary | ICD-10-CM | POA: Diagnosis not present

## 2023-05-20 DIAGNOSIS — F419 Anxiety disorder, unspecified: Secondary | ICD-10-CM | POA: Diagnosis not present

## 2023-05-20 DIAGNOSIS — M109 Gout, unspecified: Secondary | ICD-10-CM | POA: Diagnosis not present

## 2023-05-20 DIAGNOSIS — M1990 Unspecified osteoarthritis, unspecified site: Secondary | ICD-10-CM | POA: Diagnosis not present

## 2023-05-20 DIAGNOSIS — J4489 Other specified chronic obstructive pulmonary disease: Secondary | ICD-10-CM | POA: Diagnosis not present

## 2023-05-20 DIAGNOSIS — I5032 Chronic diastolic (congestive) heart failure: Secondary | ICD-10-CM | POA: Diagnosis not present

## 2023-05-20 DIAGNOSIS — I482 Chronic atrial fibrillation, unspecified: Secondary | ICD-10-CM | POA: Diagnosis not present

## 2023-05-21 DIAGNOSIS — J4489 Other specified chronic obstructive pulmonary disease: Secondary | ICD-10-CM | POA: Diagnosis not present

## 2023-05-21 DIAGNOSIS — I482 Chronic atrial fibrillation, unspecified: Secondary | ICD-10-CM | POA: Diagnosis not present

## 2023-05-21 DIAGNOSIS — M109 Gout, unspecified: Secondary | ICD-10-CM | POA: Diagnosis not present

## 2023-05-21 DIAGNOSIS — I5032 Chronic diastolic (congestive) heart failure: Secondary | ICD-10-CM | POA: Diagnosis not present

## 2023-05-21 DIAGNOSIS — M1990 Unspecified osteoarthritis, unspecified site: Secondary | ICD-10-CM | POA: Diagnosis not present

## 2023-05-21 DIAGNOSIS — F419 Anxiety disorder, unspecified: Secondary | ICD-10-CM | POA: Diagnosis not present

## 2023-05-23 DIAGNOSIS — N3946 Mixed incontinence: Secondary | ICD-10-CM | POA: Diagnosis not present

## 2023-05-23 DIAGNOSIS — M1990 Unspecified osteoarthritis, unspecified site: Secondary | ICD-10-CM | POA: Diagnosis not present

## 2023-05-23 DIAGNOSIS — I482 Chronic atrial fibrillation, unspecified: Secondary | ICD-10-CM | POA: Diagnosis not present

## 2023-05-23 DIAGNOSIS — J4489 Other specified chronic obstructive pulmonary disease: Secondary | ICD-10-CM | POA: Diagnosis not present

## 2023-05-23 DIAGNOSIS — F419 Anxiety disorder, unspecified: Secondary | ICD-10-CM | POA: Diagnosis not present

## 2023-05-23 DIAGNOSIS — M109 Gout, unspecified: Secondary | ICD-10-CM | POA: Diagnosis not present

## 2023-05-23 DIAGNOSIS — I509 Heart failure, unspecified: Secondary | ICD-10-CM | POA: Diagnosis not present

## 2023-05-23 DIAGNOSIS — I5032 Chronic diastolic (congestive) heart failure: Secondary | ICD-10-CM | POA: Diagnosis not present

## 2023-05-23 DIAGNOSIS — R5383 Other fatigue: Secondary | ICD-10-CM | POA: Diagnosis not present

## 2023-05-24 DIAGNOSIS — N3946 Mixed incontinence: Secondary | ICD-10-CM | POA: Diagnosis not present

## 2023-05-24 DIAGNOSIS — I509 Heart failure, unspecified: Secondary | ICD-10-CM | POA: Diagnosis not present

## 2023-05-24 DIAGNOSIS — R5383 Other fatigue: Secondary | ICD-10-CM | POA: Diagnosis not present

## 2023-05-27 DIAGNOSIS — M109 Gout, unspecified: Secondary | ICD-10-CM | POA: Diagnosis not present

## 2023-05-27 DIAGNOSIS — I5032 Chronic diastolic (congestive) heart failure: Secondary | ICD-10-CM | POA: Diagnosis not present

## 2023-05-27 DIAGNOSIS — I482 Chronic atrial fibrillation, unspecified: Secondary | ICD-10-CM | POA: Diagnosis not present

## 2023-05-27 DIAGNOSIS — M1990 Unspecified osteoarthritis, unspecified site: Secondary | ICD-10-CM | POA: Diagnosis not present

## 2023-05-27 DIAGNOSIS — F419 Anxiety disorder, unspecified: Secondary | ICD-10-CM | POA: Diagnosis not present

## 2023-05-27 DIAGNOSIS — J4489 Other specified chronic obstructive pulmonary disease: Secondary | ICD-10-CM | POA: Diagnosis not present

## 2023-05-29 DIAGNOSIS — J449 Chronic obstructive pulmonary disease, unspecified: Secondary | ICD-10-CM | POA: Diagnosis not present

## 2023-05-29 DIAGNOSIS — N390 Urinary tract infection, site not specified: Secondary | ICD-10-CM | POA: Diagnosis not present

## 2023-05-29 DIAGNOSIS — R5382 Chronic fatigue, unspecified: Secondary | ICD-10-CM | POA: Diagnosis not present

## 2023-05-29 DIAGNOSIS — I509 Heart failure, unspecified: Secondary | ICD-10-CM | POA: Diagnosis not present

## 2023-05-31 DIAGNOSIS — K5790 Diverticulosis of intestine, part unspecified, without perforation or abscess without bleeding: Secondary | ICD-10-CM | POA: Diagnosis not present

## 2023-05-31 DIAGNOSIS — Z86010 Personal history of colonic polyps: Secondary | ICD-10-CM | POA: Diagnosis not present

## 2023-05-31 DIAGNOSIS — Z7951 Long term (current) use of inhaled steroids: Secondary | ICD-10-CM | POA: Diagnosis not present

## 2023-05-31 DIAGNOSIS — F419 Anxiety disorder, unspecified: Secondary | ICD-10-CM | POA: Diagnosis not present

## 2023-05-31 DIAGNOSIS — Z79891 Long term (current) use of opiate analgesic: Secondary | ICD-10-CM | POA: Diagnosis not present

## 2023-05-31 DIAGNOSIS — N200 Calculus of kidney: Secondary | ICD-10-CM | POA: Diagnosis not present

## 2023-05-31 DIAGNOSIS — M109 Gout, unspecified: Secondary | ICD-10-CM | POA: Diagnosis not present

## 2023-05-31 DIAGNOSIS — Z9981 Dependence on supplemental oxygen: Secondary | ICD-10-CM | POA: Diagnosis not present

## 2023-05-31 DIAGNOSIS — G4733 Obstructive sleep apnea (adult) (pediatric): Secondary | ICD-10-CM | POA: Diagnosis not present

## 2023-05-31 DIAGNOSIS — Z87891 Personal history of nicotine dependence: Secondary | ICD-10-CM | POA: Diagnosis not present

## 2023-05-31 DIAGNOSIS — R32 Unspecified urinary incontinence: Secondary | ICD-10-CM | POA: Diagnosis not present

## 2023-05-31 DIAGNOSIS — J4489 Other specified chronic obstructive pulmonary disease: Secondary | ICD-10-CM | POA: Diagnosis not present

## 2023-05-31 DIAGNOSIS — I5032 Chronic diastolic (congestive) heart failure: Secondary | ICD-10-CM | POA: Diagnosis not present

## 2023-05-31 DIAGNOSIS — I482 Chronic atrial fibrillation, unspecified: Secondary | ICD-10-CM | POA: Diagnosis not present

## 2023-05-31 DIAGNOSIS — E7800 Pure hypercholesterolemia, unspecified: Secondary | ICD-10-CM | POA: Diagnosis not present

## 2023-05-31 DIAGNOSIS — G43909 Migraine, unspecified, not intractable, without status migrainosus: Secondary | ICD-10-CM | POA: Diagnosis not present

## 2023-05-31 DIAGNOSIS — M1990 Unspecified osteoarthritis, unspecified site: Secondary | ICD-10-CM | POA: Diagnosis not present

## 2023-05-31 DIAGNOSIS — K219 Gastro-esophageal reflux disease without esophagitis: Secondary | ICD-10-CM | POA: Diagnosis not present

## 2023-05-31 DIAGNOSIS — Z7901 Long term (current) use of anticoagulants: Secondary | ICD-10-CM | POA: Diagnosis not present

## 2023-05-31 DIAGNOSIS — Z556 Problems related to health literacy: Secondary | ICD-10-CM | POA: Diagnosis not present

## 2023-05-31 DIAGNOSIS — Z6841 Body Mass Index (BMI) 40.0 and over, adult: Secondary | ICD-10-CM | POA: Diagnosis not present

## 2023-06-03 DIAGNOSIS — M109 Gout, unspecified: Secondary | ICD-10-CM | POA: Diagnosis not present

## 2023-06-03 DIAGNOSIS — I5032 Chronic diastolic (congestive) heart failure: Secondary | ICD-10-CM | POA: Diagnosis not present

## 2023-06-03 DIAGNOSIS — M1990 Unspecified osteoarthritis, unspecified site: Secondary | ICD-10-CM | POA: Diagnosis not present

## 2023-06-03 DIAGNOSIS — I482 Chronic atrial fibrillation, unspecified: Secondary | ICD-10-CM | POA: Diagnosis not present

## 2023-06-03 DIAGNOSIS — Z1330 Encounter for screening examination for mental health and behavioral disorders, unspecified: Secondary | ICD-10-CM | POA: Diagnosis not present

## 2023-06-03 DIAGNOSIS — E785 Hyperlipidemia, unspecified: Secondary | ICD-10-CM | POA: Diagnosis not present

## 2023-06-03 DIAGNOSIS — I4811 Longstanding persistent atrial fibrillation: Secondary | ICD-10-CM | POA: Diagnosis not present

## 2023-06-03 DIAGNOSIS — J4489 Other specified chronic obstructive pulmonary disease: Secondary | ICD-10-CM | POA: Diagnosis not present

## 2023-06-03 DIAGNOSIS — F419 Anxiety disorder, unspecified: Secondary | ICD-10-CM | POA: Diagnosis not present

## 2023-06-04 DIAGNOSIS — M109 Gout, unspecified: Secondary | ICD-10-CM | POA: Diagnosis not present

## 2023-06-04 DIAGNOSIS — J4489 Other specified chronic obstructive pulmonary disease: Secondary | ICD-10-CM | POA: Diagnosis not present

## 2023-06-04 DIAGNOSIS — M1990 Unspecified osteoarthritis, unspecified site: Secondary | ICD-10-CM | POA: Diagnosis not present

## 2023-06-04 DIAGNOSIS — F419 Anxiety disorder, unspecified: Secondary | ICD-10-CM | POA: Diagnosis not present

## 2023-06-04 DIAGNOSIS — I482 Chronic atrial fibrillation, unspecified: Secondary | ICD-10-CM | POA: Diagnosis not present

## 2023-06-04 DIAGNOSIS — I5032 Chronic diastolic (congestive) heart failure: Secondary | ICD-10-CM | POA: Diagnosis not present

## 2023-06-05 DIAGNOSIS — J309 Allergic rhinitis, unspecified: Secondary | ICD-10-CM | POA: Diagnosis not present

## 2023-06-05 DIAGNOSIS — J449 Chronic obstructive pulmonary disease, unspecified: Secondary | ICD-10-CM | POA: Diagnosis not present

## 2023-06-05 DIAGNOSIS — G4733 Obstructive sleep apnea (adult) (pediatric): Secondary | ICD-10-CM | POA: Diagnosis not present

## 2023-06-11 DIAGNOSIS — R5382 Chronic fatigue, unspecified: Secondary | ICD-10-CM | POA: Diagnosis not present

## 2023-06-11 DIAGNOSIS — Z6841 Body Mass Index (BMI) 40.0 and over, adult: Secondary | ICD-10-CM | POA: Diagnosis not present

## 2023-06-11 DIAGNOSIS — U071 COVID-19: Secondary | ICD-10-CM | POA: Diagnosis not present

## 2023-06-11 DIAGNOSIS — Z20828 Contact with and (suspected) exposure to other viral communicable diseases: Secondary | ICD-10-CM | POA: Diagnosis not present

## 2023-06-11 DIAGNOSIS — N390 Urinary tract infection, site not specified: Secondary | ICD-10-CM | POA: Diagnosis not present

## 2023-06-11 DIAGNOSIS — R030 Elevated blood-pressure reading, without diagnosis of hypertension: Secondary | ICD-10-CM | POA: Diagnosis not present

## 2023-06-12 DIAGNOSIS — I5032 Chronic diastolic (congestive) heart failure: Secondary | ICD-10-CM | POA: Diagnosis not present

## 2023-06-12 DIAGNOSIS — M1990 Unspecified osteoarthritis, unspecified site: Secondary | ICD-10-CM | POA: Diagnosis not present

## 2023-06-12 DIAGNOSIS — F419 Anxiety disorder, unspecified: Secondary | ICD-10-CM | POA: Diagnosis not present

## 2023-06-12 DIAGNOSIS — I482 Chronic atrial fibrillation, unspecified: Secondary | ICD-10-CM | POA: Diagnosis not present

## 2023-06-12 DIAGNOSIS — J4489 Other specified chronic obstructive pulmonary disease: Secondary | ICD-10-CM | POA: Diagnosis not present

## 2023-06-12 DIAGNOSIS — M109 Gout, unspecified: Secondary | ICD-10-CM | POA: Diagnosis not present

## 2023-06-18 DIAGNOSIS — J4489 Other specified chronic obstructive pulmonary disease: Secondary | ICD-10-CM | POA: Diagnosis not present

## 2023-06-18 DIAGNOSIS — M1990 Unspecified osteoarthritis, unspecified site: Secondary | ICD-10-CM | POA: Diagnosis not present

## 2023-06-18 DIAGNOSIS — F419 Anxiety disorder, unspecified: Secondary | ICD-10-CM | POA: Diagnosis not present

## 2023-06-18 DIAGNOSIS — I5032 Chronic diastolic (congestive) heart failure: Secondary | ICD-10-CM | POA: Diagnosis not present

## 2023-06-18 DIAGNOSIS — M109 Gout, unspecified: Secondary | ICD-10-CM | POA: Diagnosis not present

## 2023-06-18 DIAGNOSIS — I482 Chronic atrial fibrillation, unspecified: Secondary | ICD-10-CM | POA: Diagnosis not present

## 2023-06-21 DIAGNOSIS — I5032 Chronic diastolic (congestive) heart failure: Secondary | ICD-10-CM | POA: Diagnosis not present

## 2023-06-21 DIAGNOSIS — M1990 Unspecified osteoarthritis, unspecified site: Secondary | ICD-10-CM | POA: Diagnosis not present

## 2023-06-21 DIAGNOSIS — M109 Gout, unspecified: Secondary | ICD-10-CM | POA: Diagnosis not present

## 2023-06-21 DIAGNOSIS — F419 Anxiety disorder, unspecified: Secondary | ICD-10-CM | POA: Diagnosis not present

## 2023-06-21 DIAGNOSIS — J4489 Other specified chronic obstructive pulmonary disease: Secondary | ICD-10-CM | POA: Diagnosis not present

## 2023-06-21 DIAGNOSIS — I482 Chronic atrial fibrillation, unspecified: Secondary | ICD-10-CM | POA: Diagnosis not present

## 2023-06-25 DIAGNOSIS — F419 Anxiety disorder, unspecified: Secondary | ICD-10-CM | POA: Diagnosis not present

## 2023-06-25 DIAGNOSIS — M1990 Unspecified osteoarthritis, unspecified site: Secondary | ICD-10-CM | POA: Diagnosis not present

## 2023-06-25 DIAGNOSIS — M109 Gout, unspecified: Secondary | ICD-10-CM | POA: Diagnosis not present

## 2023-06-25 DIAGNOSIS — J4489 Other specified chronic obstructive pulmonary disease: Secondary | ICD-10-CM | POA: Diagnosis not present

## 2023-06-25 DIAGNOSIS — I482 Chronic atrial fibrillation, unspecified: Secondary | ICD-10-CM | POA: Diagnosis not present

## 2023-06-25 DIAGNOSIS — I5032 Chronic diastolic (congestive) heart failure: Secondary | ICD-10-CM | POA: Diagnosis not present

## 2023-06-26 DIAGNOSIS — I5032 Chronic diastolic (congestive) heart failure: Secondary | ICD-10-CM | POA: Diagnosis not present

## 2023-06-26 DIAGNOSIS — J4489 Other specified chronic obstructive pulmonary disease: Secondary | ICD-10-CM | POA: Diagnosis not present

## 2023-06-26 DIAGNOSIS — M1990 Unspecified osteoarthritis, unspecified site: Secondary | ICD-10-CM | POA: Diagnosis not present

## 2023-06-26 DIAGNOSIS — M109 Gout, unspecified: Secondary | ICD-10-CM | POA: Diagnosis not present

## 2023-06-26 DIAGNOSIS — I482 Chronic atrial fibrillation, unspecified: Secondary | ICD-10-CM | POA: Diagnosis not present

## 2023-06-26 DIAGNOSIS — F419 Anxiety disorder, unspecified: Secondary | ICD-10-CM | POA: Diagnosis not present

## 2023-06-30 DIAGNOSIS — U071 COVID-19: Secondary | ICD-10-CM | POA: Diagnosis not present

## 2023-06-30 DIAGNOSIS — Z87891 Personal history of nicotine dependence: Secondary | ICD-10-CM | POA: Diagnosis not present

## 2023-06-30 DIAGNOSIS — J4489 Other specified chronic obstructive pulmonary disease: Secondary | ICD-10-CM | POA: Diagnosis not present

## 2023-06-30 DIAGNOSIS — I482 Chronic atrial fibrillation, unspecified: Secondary | ICD-10-CM | POA: Diagnosis not present

## 2023-06-30 DIAGNOSIS — J302 Other seasonal allergic rhinitis: Secondary | ICD-10-CM | POA: Diagnosis not present

## 2023-06-30 DIAGNOSIS — F419 Anxiety disorder, unspecified: Secondary | ICD-10-CM | POA: Diagnosis not present

## 2023-06-30 DIAGNOSIS — Z7951 Long term (current) use of inhaled steroids: Secondary | ICD-10-CM | POA: Diagnosis not present

## 2023-06-30 DIAGNOSIS — G4733 Obstructive sleep apnea (adult) (pediatric): Secondary | ICD-10-CM | POA: Diagnosis not present

## 2023-06-30 DIAGNOSIS — M109 Gout, unspecified: Secondary | ICD-10-CM | POA: Diagnosis not present

## 2023-06-30 DIAGNOSIS — Z79891 Long term (current) use of opiate analgesic: Secondary | ICD-10-CM | POA: Diagnosis not present

## 2023-06-30 DIAGNOSIS — M1990 Unspecified osteoarthritis, unspecified site: Secondary | ICD-10-CM | POA: Diagnosis not present

## 2023-06-30 DIAGNOSIS — R32 Unspecified urinary incontinence: Secondary | ICD-10-CM | POA: Diagnosis not present

## 2023-06-30 DIAGNOSIS — E7800 Pure hypercholesterolemia, unspecified: Secondary | ICD-10-CM | POA: Diagnosis not present

## 2023-06-30 DIAGNOSIS — K760 Fatty (change of) liver, not elsewhere classified: Secondary | ICD-10-CM | POA: Diagnosis not present

## 2023-06-30 DIAGNOSIS — Z6841 Body Mass Index (BMI) 40.0 and over, adult: Secondary | ICD-10-CM | POA: Diagnosis not present

## 2023-06-30 DIAGNOSIS — K5790 Diverticulosis of intestine, part unspecified, without perforation or abscess without bleeding: Secondary | ICD-10-CM | POA: Diagnosis not present

## 2023-06-30 DIAGNOSIS — E079 Disorder of thyroid, unspecified: Secondary | ICD-10-CM | POA: Diagnosis not present

## 2023-06-30 DIAGNOSIS — I5032 Chronic diastolic (congestive) heart failure: Secondary | ICD-10-CM | POA: Diagnosis not present

## 2023-06-30 DIAGNOSIS — Z556 Problems related to health literacy: Secondary | ICD-10-CM | POA: Diagnosis not present

## 2023-06-30 DIAGNOSIS — K219 Gastro-esophageal reflux disease without esophagitis: Secondary | ICD-10-CM | POA: Diagnosis not present

## 2023-06-30 DIAGNOSIS — Z7901 Long term (current) use of anticoagulants: Secondary | ICD-10-CM | POA: Diagnosis not present

## 2023-06-30 DIAGNOSIS — D649 Anemia, unspecified: Secondary | ICD-10-CM | POA: Diagnosis not present

## 2023-06-30 DIAGNOSIS — N200 Calculus of kidney: Secondary | ICD-10-CM | POA: Diagnosis not present

## 2023-06-30 DIAGNOSIS — G43909 Migraine, unspecified, not intractable, without status migrainosus: Secondary | ICD-10-CM | POA: Diagnosis not present

## 2023-07-02 DIAGNOSIS — J4489 Other specified chronic obstructive pulmonary disease: Secondary | ICD-10-CM | POA: Diagnosis not present

## 2023-07-02 DIAGNOSIS — I5032 Chronic diastolic (congestive) heart failure: Secondary | ICD-10-CM | POA: Diagnosis not present

## 2023-07-02 DIAGNOSIS — U071 COVID-19: Secondary | ICD-10-CM | POA: Diagnosis not present

## 2023-07-02 DIAGNOSIS — I482 Chronic atrial fibrillation, unspecified: Secondary | ICD-10-CM | POA: Diagnosis not present

## 2023-07-02 DIAGNOSIS — Z6841 Body Mass Index (BMI) 40.0 and over, adult: Secondary | ICD-10-CM | POA: Diagnosis not present

## 2023-07-03 DIAGNOSIS — H33321 Round hole, right eye: Secondary | ICD-10-CM | POA: Diagnosis not present

## 2023-07-03 DIAGNOSIS — H2513 Age-related nuclear cataract, bilateral: Secondary | ICD-10-CM | POA: Diagnosis not present

## 2023-07-04 DIAGNOSIS — Z6841 Body Mass Index (BMI) 40.0 and over, adult: Secondary | ICD-10-CM | POA: Diagnosis not present

## 2023-07-04 DIAGNOSIS — I482 Chronic atrial fibrillation, unspecified: Secondary | ICD-10-CM | POA: Diagnosis not present

## 2023-07-04 DIAGNOSIS — I5032 Chronic diastolic (congestive) heart failure: Secondary | ICD-10-CM | POA: Diagnosis not present

## 2023-07-04 DIAGNOSIS — U071 COVID-19: Secondary | ICD-10-CM | POA: Diagnosis not present

## 2023-07-04 DIAGNOSIS — N390 Urinary tract infection, site not specified: Secondary | ICD-10-CM | POA: Diagnosis not present

## 2023-07-04 DIAGNOSIS — J4489 Other specified chronic obstructive pulmonary disease: Secondary | ICD-10-CM | POA: Diagnosis not present

## 2023-07-08 DIAGNOSIS — J4489 Other specified chronic obstructive pulmonary disease: Secondary | ICD-10-CM | POA: Diagnosis not present

## 2023-07-08 DIAGNOSIS — Z6841 Body Mass Index (BMI) 40.0 and over, adult: Secondary | ICD-10-CM | POA: Diagnosis not present

## 2023-07-08 DIAGNOSIS — I5032 Chronic diastolic (congestive) heart failure: Secondary | ICD-10-CM | POA: Diagnosis not present

## 2023-07-08 DIAGNOSIS — U071 COVID-19: Secondary | ICD-10-CM | POA: Diagnosis not present

## 2023-07-08 DIAGNOSIS — I482 Chronic atrial fibrillation, unspecified: Secondary | ICD-10-CM | POA: Diagnosis not present

## 2023-07-12 DIAGNOSIS — U071 COVID-19: Secondary | ICD-10-CM | POA: Diagnosis not present

## 2023-07-12 DIAGNOSIS — G4733 Obstructive sleep apnea (adult) (pediatric): Secondary | ICD-10-CM | POA: Diagnosis not present

## 2023-07-12 DIAGNOSIS — J4489 Other specified chronic obstructive pulmonary disease: Secondary | ICD-10-CM | POA: Diagnosis not present

## 2023-07-12 DIAGNOSIS — I482 Chronic atrial fibrillation, unspecified: Secondary | ICD-10-CM | POA: Diagnosis not present

## 2023-07-12 DIAGNOSIS — I5032 Chronic diastolic (congestive) heart failure: Secondary | ICD-10-CM | POA: Diagnosis not present

## 2023-07-12 DIAGNOSIS — D649 Anemia, unspecified: Secondary | ICD-10-CM | POA: Diagnosis not present

## 2023-07-12 DIAGNOSIS — Z6841 Body Mass Index (BMI) 40.0 and over, adult: Secondary | ICD-10-CM | POA: Diagnosis not present

## 2023-07-17 DIAGNOSIS — U071 COVID-19: Secondary | ICD-10-CM | POA: Diagnosis not present

## 2023-07-17 DIAGNOSIS — J4489 Other specified chronic obstructive pulmonary disease: Secondary | ICD-10-CM | POA: Diagnosis not present

## 2023-07-17 DIAGNOSIS — Z6841 Body Mass Index (BMI) 40.0 and over, adult: Secondary | ICD-10-CM | POA: Diagnosis not present

## 2023-07-17 DIAGNOSIS — I5032 Chronic diastolic (congestive) heart failure: Secondary | ICD-10-CM | POA: Diagnosis not present

## 2023-07-17 DIAGNOSIS — I482 Chronic atrial fibrillation, unspecified: Secondary | ICD-10-CM | POA: Diagnosis not present

## 2023-07-19 DIAGNOSIS — I5032 Chronic diastolic (congestive) heart failure: Secondary | ICD-10-CM | POA: Diagnosis not present

## 2023-07-19 DIAGNOSIS — U071 COVID-19: Secondary | ICD-10-CM | POA: Diagnosis not present

## 2023-07-19 DIAGNOSIS — Z6841 Body Mass Index (BMI) 40.0 and over, adult: Secondary | ICD-10-CM | POA: Diagnosis not present

## 2023-07-19 DIAGNOSIS — J4489 Other specified chronic obstructive pulmonary disease: Secondary | ICD-10-CM | POA: Diagnosis not present

## 2023-07-19 DIAGNOSIS — I482 Chronic atrial fibrillation, unspecified: Secondary | ICD-10-CM | POA: Diagnosis not present

## 2023-07-22 DIAGNOSIS — J4489 Other specified chronic obstructive pulmonary disease: Secondary | ICD-10-CM | POA: Diagnosis not present

## 2023-07-22 DIAGNOSIS — U071 COVID-19: Secondary | ICD-10-CM | POA: Diagnosis not present

## 2023-07-22 DIAGNOSIS — I482 Chronic atrial fibrillation, unspecified: Secondary | ICD-10-CM | POA: Diagnosis not present

## 2023-07-22 DIAGNOSIS — I5032 Chronic diastolic (congestive) heart failure: Secondary | ICD-10-CM | POA: Diagnosis not present

## 2023-07-22 DIAGNOSIS — Z6841 Body Mass Index (BMI) 40.0 and over, adult: Secondary | ICD-10-CM | POA: Diagnosis not present

## 2023-07-24 DIAGNOSIS — Z6841 Body Mass Index (BMI) 40.0 and over, adult: Secondary | ICD-10-CM | POA: Diagnosis not present

## 2023-07-24 DIAGNOSIS — I5032 Chronic diastolic (congestive) heart failure: Secondary | ICD-10-CM | POA: Diagnosis not present

## 2023-07-24 DIAGNOSIS — I482 Chronic atrial fibrillation, unspecified: Secondary | ICD-10-CM | POA: Diagnosis not present

## 2023-07-24 DIAGNOSIS — U071 COVID-19: Secondary | ICD-10-CM | POA: Diagnosis not present

## 2023-07-24 DIAGNOSIS — J4489 Other specified chronic obstructive pulmonary disease: Secondary | ICD-10-CM | POA: Diagnosis not present

## 2023-07-25 DIAGNOSIS — N390 Urinary tract infection, site not specified: Secondary | ICD-10-CM | POA: Diagnosis not present

## 2023-07-26 DIAGNOSIS — I482 Chronic atrial fibrillation, unspecified: Secondary | ICD-10-CM | POA: Diagnosis not present

## 2023-07-26 DIAGNOSIS — U071 COVID-19: Secondary | ICD-10-CM | POA: Diagnosis not present

## 2023-07-26 DIAGNOSIS — Z6841 Body Mass Index (BMI) 40.0 and over, adult: Secondary | ICD-10-CM | POA: Diagnosis not present

## 2023-07-26 DIAGNOSIS — I5032 Chronic diastolic (congestive) heart failure: Secondary | ICD-10-CM | POA: Diagnosis not present

## 2023-07-26 DIAGNOSIS — J4489 Other specified chronic obstructive pulmonary disease: Secondary | ICD-10-CM | POA: Diagnosis not present

## 2023-07-30 DIAGNOSIS — G4733 Obstructive sleep apnea (adult) (pediatric): Secondary | ICD-10-CM | POA: Diagnosis not present

## 2023-07-30 DIAGNOSIS — E079 Disorder of thyroid, unspecified: Secondary | ICD-10-CM | POA: Diagnosis not present

## 2023-07-30 DIAGNOSIS — Z7951 Long term (current) use of inhaled steroids: Secondary | ICD-10-CM | POA: Diagnosis not present

## 2023-07-30 DIAGNOSIS — K760 Fatty (change of) liver, not elsewhere classified: Secondary | ICD-10-CM | POA: Diagnosis not present

## 2023-07-30 DIAGNOSIS — Z79891 Long term (current) use of opiate analgesic: Secondary | ICD-10-CM | POA: Diagnosis not present

## 2023-07-30 DIAGNOSIS — K5790 Diverticulosis of intestine, part unspecified, without perforation or abscess without bleeding: Secondary | ICD-10-CM | POA: Diagnosis not present

## 2023-07-30 DIAGNOSIS — E7800 Pure hypercholesterolemia, unspecified: Secondary | ICD-10-CM | POA: Diagnosis not present

## 2023-07-30 DIAGNOSIS — I5032 Chronic diastolic (congestive) heart failure: Secondary | ICD-10-CM | POA: Diagnosis not present

## 2023-07-30 DIAGNOSIS — R32 Unspecified urinary incontinence: Secondary | ICD-10-CM | POA: Diagnosis not present

## 2023-07-30 DIAGNOSIS — N200 Calculus of kidney: Secondary | ICD-10-CM | POA: Diagnosis not present

## 2023-07-30 DIAGNOSIS — M1990 Unspecified osteoarthritis, unspecified site: Secondary | ICD-10-CM | POA: Diagnosis not present

## 2023-07-30 DIAGNOSIS — G43909 Migraine, unspecified, not intractable, without status migrainosus: Secondary | ICD-10-CM | POA: Diagnosis not present

## 2023-07-30 DIAGNOSIS — Z87891 Personal history of nicotine dependence: Secondary | ICD-10-CM | POA: Diagnosis not present

## 2023-07-30 DIAGNOSIS — F419 Anxiety disorder, unspecified: Secondary | ICD-10-CM | POA: Diagnosis not present

## 2023-07-30 DIAGNOSIS — M109 Gout, unspecified: Secondary | ICD-10-CM | POA: Diagnosis not present

## 2023-07-30 DIAGNOSIS — K219 Gastro-esophageal reflux disease without esophagitis: Secondary | ICD-10-CM | POA: Diagnosis not present

## 2023-07-30 DIAGNOSIS — Z556 Problems related to health literacy: Secondary | ICD-10-CM | POA: Diagnosis not present

## 2023-07-30 DIAGNOSIS — J4489 Other specified chronic obstructive pulmonary disease: Secondary | ICD-10-CM | POA: Diagnosis not present

## 2023-07-30 DIAGNOSIS — Z7901 Long term (current) use of anticoagulants: Secondary | ICD-10-CM | POA: Diagnosis not present

## 2023-07-30 DIAGNOSIS — D649 Anemia, unspecified: Secondary | ICD-10-CM | POA: Diagnosis not present

## 2023-07-30 DIAGNOSIS — Z6841 Body Mass Index (BMI) 40.0 and over, adult: Secondary | ICD-10-CM | POA: Diagnosis not present

## 2023-07-30 DIAGNOSIS — I482 Chronic atrial fibrillation, unspecified: Secondary | ICD-10-CM | POA: Diagnosis not present

## 2023-07-30 DIAGNOSIS — U071 COVID-19: Secondary | ICD-10-CM | POA: Diagnosis not present

## 2023-07-30 DIAGNOSIS — J302 Other seasonal allergic rhinitis: Secondary | ICD-10-CM | POA: Diagnosis not present

## 2023-08-01 DIAGNOSIS — I482 Chronic atrial fibrillation, unspecified: Secondary | ICD-10-CM | POA: Diagnosis not present

## 2023-08-01 DIAGNOSIS — Z6841 Body Mass Index (BMI) 40.0 and over, adult: Secondary | ICD-10-CM | POA: Diagnosis not present

## 2023-08-01 DIAGNOSIS — J4489 Other specified chronic obstructive pulmonary disease: Secondary | ICD-10-CM | POA: Diagnosis not present

## 2023-08-01 DIAGNOSIS — U071 COVID-19: Secondary | ICD-10-CM | POA: Diagnosis not present

## 2023-08-01 DIAGNOSIS — I5032 Chronic diastolic (congestive) heart failure: Secondary | ICD-10-CM | POA: Diagnosis not present

## 2023-08-02 DIAGNOSIS — I482 Chronic atrial fibrillation, unspecified: Secondary | ICD-10-CM | POA: Diagnosis not present

## 2023-08-02 DIAGNOSIS — Z6841 Body Mass Index (BMI) 40.0 and over, adult: Secondary | ICD-10-CM | POA: Diagnosis not present

## 2023-08-02 DIAGNOSIS — U071 COVID-19: Secondary | ICD-10-CM | POA: Diagnosis not present

## 2023-08-02 DIAGNOSIS — J4489 Other specified chronic obstructive pulmonary disease: Secondary | ICD-10-CM | POA: Diagnosis not present

## 2023-08-02 DIAGNOSIS — I5032 Chronic diastolic (congestive) heart failure: Secondary | ICD-10-CM | POA: Diagnosis not present

## 2023-08-03 DIAGNOSIS — Z6841 Body Mass Index (BMI) 40.0 and over, adult: Secondary | ICD-10-CM | POA: Diagnosis not present

## 2023-08-03 DIAGNOSIS — I482 Chronic atrial fibrillation, unspecified: Secondary | ICD-10-CM | POA: Diagnosis not present

## 2023-08-03 DIAGNOSIS — U071 COVID-19: Secondary | ICD-10-CM | POA: Diagnosis not present

## 2023-08-03 DIAGNOSIS — I5032 Chronic diastolic (congestive) heart failure: Secondary | ICD-10-CM | POA: Diagnosis not present

## 2023-08-03 DIAGNOSIS — J4489 Other specified chronic obstructive pulmonary disease: Secondary | ICD-10-CM | POA: Diagnosis not present

## 2023-08-07 DIAGNOSIS — J4489 Other specified chronic obstructive pulmonary disease: Secondary | ICD-10-CM | POA: Diagnosis not present

## 2023-08-07 DIAGNOSIS — Z6841 Body Mass Index (BMI) 40.0 and over, adult: Secondary | ICD-10-CM | POA: Diagnosis not present

## 2023-08-07 DIAGNOSIS — I5032 Chronic diastolic (congestive) heart failure: Secondary | ICD-10-CM | POA: Diagnosis not present

## 2023-08-07 DIAGNOSIS — U071 COVID-19: Secondary | ICD-10-CM | POA: Diagnosis not present

## 2023-08-07 DIAGNOSIS — I482 Chronic atrial fibrillation, unspecified: Secondary | ICD-10-CM | POA: Diagnosis not present

## 2023-08-08 DIAGNOSIS — J4489 Other specified chronic obstructive pulmonary disease: Secondary | ICD-10-CM | POA: Diagnosis not present

## 2023-08-08 DIAGNOSIS — Z6841 Body Mass Index (BMI) 40.0 and over, adult: Secondary | ICD-10-CM | POA: Diagnosis not present

## 2023-08-08 DIAGNOSIS — I5032 Chronic diastolic (congestive) heart failure: Secondary | ICD-10-CM | POA: Diagnosis not present

## 2023-08-08 DIAGNOSIS — I482 Chronic atrial fibrillation, unspecified: Secondary | ICD-10-CM | POA: Diagnosis not present

## 2023-08-08 DIAGNOSIS — U071 COVID-19: Secondary | ICD-10-CM | POA: Diagnosis not present

## 2023-08-09 DIAGNOSIS — I482 Chronic atrial fibrillation, unspecified: Secondary | ICD-10-CM | POA: Diagnosis not present

## 2023-08-09 DIAGNOSIS — J4489 Other specified chronic obstructive pulmonary disease: Secondary | ICD-10-CM | POA: Diagnosis not present

## 2023-08-09 DIAGNOSIS — I5032 Chronic diastolic (congestive) heart failure: Secondary | ICD-10-CM | POA: Diagnosis not present

## 2023-08-09 DIAGNOSIS — U071 COVID-19: Secondary | ICD-10-CM | POA: Diagnosis not present

## 2023-08-09 DIAGNOSIS — Z6841 Body Mass Index (BMI) 40.0 and over, adult: Secondary | ICD-10-CM | POA: Diagnosis not present

## 2023-08-12 DIAGNOSIS — J309 Allergic rhinitis, unspecified: Secondary | ICD-10-CM | POA: Diagnosis not present

## 2023-08-12 DIAGNOSIS — J449 Chronic obstructive pulmonary disease, unspecified: Secondary | ICD-10-CM | POA: Diagnosis not present

## 2023-08-12 DIAGNOSIS — G4733 Obstructive sleep apnea (adult) (pediatric): Secondary | ICD-10-CM | POA: Diagnosis not present

## 2023-08-13 DIAGNOSIS — I5032 Chronic diastolic (congestive) heart failure: Secondary | ICD-10-CM | POA: Diagnosis not present

## 2023-08-13 DIAGNOSIS — I482 Chronic atrial fibrillation, unspecified: Secondary | ICD-10-CM | POA: Diagnosis not present

## 2023-08-13 DIAGNOSIS — U071 COVID-19: Secondary | ICD-10-CM | POA: Diagnosis not present

## 2023-08-13 DIAGNOSIS — J4489 Other specified chronic obstructive pulmonary disease: Secondary | ICD-10-CM | POA: Diagnosis not present

## 2023-08-13 DIAGNOSIS — Z6841 Body Mass Index (BMI) 40.0 and over, adult: Secondary | ICD-10-CM | POA: Diagnosis not present

## 2023-08-16 DIAGNOSIS — J4489 Other specified chronic obstructive pulmonary disease: Secondary | ICD-10-CM | POA: Diagnosis not present

## 2023-08-16 DIAGNOSIS — U071 COVID-19: Secondary | ICD-10-CM | POA: Diagnosis not present

## 2023-08-16 DIAGNOSIS — I5032 Chronic diastolic (congestive) heart failure: Secondary | ICD-10-CM | POA: Diagnosis not present

## 2023-08-16 DIAGNOSIS — I482 Chronic atrial fibrillation, unspecified: Secondary | ICD-10-CM | POA: Diagnosis not present

## 2023-08-16 DIAGNOSIS — Z6841 Body Mass Index (BMI) 40.0 and over, adult: Secondary | ICD-10-CM | POA: Diagnosis not present

## 2023-08-20 DIAGNOSIS — U071 COVID-19: Secondary | ICD-10-CM | POA: Diagnosis not present

## 2023-08-20 DIAGNOSIS — Z6841 Body Mass Index (BMI) 40.0 and over, adult: Secondary | ICD-10-CM | POA: Diagnosis not present

## 2023-08-20 DIAGNOSIS — J4489 Other specified chronic obstructive pulmonary disease: Secondary | ICD-10-CM | POA: Diagnosis not present

## 2023-08-20 DIAGNOSIS — I482 Chronic atrial fibrillation, unspecified: Secondary | ICD-10-CM | POA: Diagnosis not present

## 2023-08-20 DIAGNOSIS — I5032 Chronic diastolic (congestive) heart failure: Secondary | ICD-10-CM | POA: Diagnosis not present

## 2023-08-21 DIAGNOSIS — I5032 Chronic diastolic (congestive) heart failure: Secondary | ICD-10-CM | POA: Diagnosis not present

## 2023-08-21 DIAGNOSIS — U071 COVID-19: Secondary | ICD-10-CM | POA: Diagnosis not present

## 2023-08-21 DIAGNOSIS — I482 Chronic atrial fibrillation, unspecified: Secondary | ICD-10-CM | POA: Diagnosis not present

## 2023-08-21 DIAGNOSIS — Z6841 Body Mass Index (BMI) 40.0 and over, adult: Secondary | ICD-10-CM | POA: Diagnosis not present

## 2023-08-21 DIAGNOSIS — J4489 Other specified chronic obstructive pulmonary disease: Secondary | ICD-10-CM | POA: Diagnosis not present

## 2023-08-22 DIAGNOSIS — N390 Urinary tract infection, site not specified: Secondary | ICD-10-CM | POA: Diagnosis not present

## 2023-08-26 DIAGNOSIS — Z6841 Body Mass Index (BMI) 40.0 and over, adult: Secondary | ICD-10-CM | POA: Diagnosis not present

## 2023-08-26 DIAGNOSIS — I482 Chronic atrial fibrillation, unspecified: Secondary | ICD-10-CM | POA: Diagnosis not present

## 2023-08-26 DIAGNOSIS — J4489 Other specified chronic obstructive pulmonary disease: Secondary | ICD-10-CM | POA: Diagnosis not present

## 2023-08-26 DIAGNOSIS — I5032 Chronic diastolic (congestive) heart failure: Secondary | ICD-10-CM | POA: Diagnosis not present

## 2023-08-26 DIAGNOSIS — U071 COVID-19: Secondary | ICD-10-CM | POA: Diagnosis not present

## 2023-09-03 DIAGNOSIS — I5032 Chronic diastolic (congestive) heart failure: Secondary | ICD-10-CM | POA: Diagnosis not present

## 2023-09-03 DIAGNOSIS — I4811 Longstanding persistent atrial fibrillation: Secondary | ICD-10-CM | POA: Diagnosis not present

## 2023-10-07 DIAGNOSIS — J309 Allergic rhinitis, unspecified: Secondary | ICD-10-CM | POA: Diagnosis not present

## 2023-10-07 DIAGNOSIS — J449 Chronic obstructive pulmonary disease, unspecified: Secondary | ICD-10-CM | POA: Diagnosis not present

## 2023-10-07 DIAGNOSIS — G4733 Obstructive sleep apnea (adult) (pediatric): Secondary | ICD-10-CM | POA: Diagnosis not present

## 2023-11-29 DIAGNOSIS — N390 Urinary tract infection, site not specified: Secondary | ICD-10-CM | POA: Diagnosis not present

## 2023-12-09 DIAGNOSIS — I5032 Chronic diastolic (congestive) heart failure: Secondary | ICD-10-CM | POA: Diagnosis not present

## 2023-12-09 DIAGNOSIS — I4811 Longstanding persistent atrial fibrillation: Secondary | ICD-10-CM | POA: Diagnosis not present

## 2023-12-12 DIAGNOSIS — I4811 Longstanding persistent atrial fibrillation: Secondary | ICD-10-CM | POA: Diagnosis not present

## 2023-12-12 DIAGNOSIS — I5032 Chronic diastolic (congestive) heart failure: Secondary | ICD-10-CM | POA: Diagnosis not present

## 2023-12-26 DIAGNOSIS — I5032 Chronic diastolic (congestive) heart failure: Secondary | ICD-10-CM | POA: Diagnosis not present

## 2023-12-26 DIAGNOSIS — I4811 Longstanding persistent atrial fibrillation: Secondary | ICD-10-CM | POA: Diagnosis not present

## 2023-12-27 DIAGNOSIS — I5032 Chronic diastolic (congestive) heart failure: Secondary | ICD-10-CM | POA: Diagnosis not present

## 2023-12-27 DIAGNOSIS — I4811 Longstanding persistent atrial fibrillation: Secondary | ICD-10-CM | POA: Diagnosis not present

## 2024-01-07 DIAGNOSIS — I5032 Chronic diastolic (congestive) heart failure: Secondary | ICD-10-CM | POA: Diagnosis not present

## 2024-01-07 DIAGNOSIS — Z79899 Other long term (current) drug therapy: Secondary | ICD-10-CM | POA: Diagnosis not present

## 2024-01-07 DIAGNOSIS — E876 Hypokalemia: Secondary | ICD-10-CM | POA: Diagnosis not present

## 2024-01-07 DIAGNOSIS — I4811 Longstanding persistent atrial fibrillation: Secondary | ICD-10-CM | POA: Diagnosis not present

## 2024-01-13 DIAGNOSIS — E876 Hypokalemia: Secondary | ICD-10-CM | POA: Diagnosis not present

## 2024-01-27 DIAGNOSIS — M47816 Spondylosis without myelopathy or radiculopathy, lumbar region: Secondary | ICD-10-CM | POA: Diagnosis not present

## 2024-01-27 DIAGNOSIS — G894 Chronic pain syndrome: Secondary | ICD-10-CM | POA: Diagnosis not present

## 2024-01-27 DIAGNOSIS — Z79891 Long term (current) use of opiate analgesic: Secondary | ICD-10-CM | POA: Diagnosis not present

## 2024-01-27 DIAGNOSIS — M5459 Other low back pain: Secondary | ICD-10-CM | POA: Diagnosis not present

## 2024-01-28 DIAGNOSIS — I4811 Longstanding persistent atrial fibrillation: Secondary | ICD-10-CM | POA: Diagnosis not present

## 2024-01-28 DIAGNOSIS — I5032 Chronic diastolic (congestive) heart failure: Secondary | ICD-10-CM | POA: Diagnosis not present

## 2024-02-07 DIAGNOSIS — I5032 Chronic diastolic (congestive) heart failure: Secondary | ICD-10-CM | POA: Diagnosis not present

## 2024-02-12 DIAGNOSIS — R300 Dysuria: Secondary | ICD-10-CM | POA: Diagnosis not present

## 2024-02-18 DIAGNOSIS — I5032 Chronic diastolic (congestive) heart failure: Secondary | ICD-10-CM | POA: Diagnosis not present

## 2024-02-18 DIAGNOSIS — I4811 Longstanding persistent atrial fibrillation: Secondary | ICD-10-CM | POA: Diagnosis not present

## 2024-03-02 DIAGNOSIS — I482 Chronic atrial fibrillation, unspecified: Secondary | ICD-10-CM | POA: Diagnosis not present

## 2024-03-02 DIAGNOSIS — G4733 Obstructive sleep apnea (adult) (pediatric): Secondary | ICD-10-CM | POA: Diagnosis not present

## 2024-03-02 DIAGNOSIS — I509 Heart failure, unspecified: Secondary | ICD-10-CM | POA: Diagnosis not present

## 2024-03-02 DIAGNOSIS — R531 Weakness: Secondary | ICD-10-CM | POA: Diagnosis not present

## 2024-03-02 DIAGNOSIS — R188 Other ascites: Secondary | ICD-10-CM | POA: Diagnosis not present

## 2024-03-02 DIAGNOSIS — R404 Transient alteration of awareness: Secondary | ICD-10-CM | POA: Diagnosis not present

## 2024-03-02 DIAGNOSIS — K5904 Chronic idiopathic constipation: Secondary | ICD-10-CM | POA: Diagnosis present

## 2024-03-02 DIAGNOSIS — I4891 Unspecified atrial fibrillation: Secondary | ICD-10-CM | POA: Diagnosis not present

## 2024-03-02 DIAGNOSIS — Z7409 Other reduced mobility: Secondary | ICD-10-CM | POA: Diagnosis present

## 2024-03-02 DIAGNOSIS — S8012XA Contusion of left lower leg, initial encounter: Secondary | ICD-10-CM | POA: Diagnosis not present

## 2024-03-02 DIAGNOSIS — Z7401 Bed confinement status: Secondary | ICD-10-CM | POA: Diagnosis not present

## 2024-03-02 DIAGNOSIS — Z7901 Long term (current) use of anticoagulants: Secondary | ICD-10-CM | POA: Diagnosis not present

## 2024-03-02 DIAGNOSIS — R578 Other shock: Secondary | ICD-10-CM | POA: Diagnosis not present

## 2024-03-02 DIAGNOSIS — R410 Disorientation, unspecified: Secondary | ICD-10-CM | POA: Diagnosis present

## 2024-03-02 DIAGNOSIS — R5381 Other malaise: Secondary | ICD-10-CM | POA: Diagnosis present

## 2024-03-02 DIAGNOSIS — R1013 Epigastric pain: Secondary | ICD-10-CM | POA: Diagnosis not present

## 2024-03-02 DIAGNOSIS — E876 Hypokalemia: Secondary | ICD-10-CM | POA: Diagnosis present

## 2024-03-02 DIAGNOSIS — Z6841 Body Mass Index (BMI) 40.0 and over, adult: Secondary | ICD-10-CM | POA: Diagnosis not present

## 2024-03-02 DIAGNOSIS — A0472 Enterocolitis due to Clostridium difficile, not specified as recurrent: Secondary | ICD-10-CM | POA: Diagnosis present

## 2024-03-02 DIAGNOSIS — R509 Fever, unspecified: Secondary | ICD-10-CM | POA: Diagnosis not present

## 2024-03-02 DIAGNOSIS — Z9981 Dependence on supplemental oxygen: Secondary | ICD-10-CM | POA: Diagnosis not present

## 2024-03-02 DIAGNOSIS — M1990 Unspecified osteoarthritis, unspecified site: Secondary | ICD-10-CM | POA: Diagnosis present

## 2024-03-02 DIAGNOSIS — I5033 Acute on chronic diastolic (congestive) heart failure: Secondary | ICD-10-CM | POA: Diagnosis present

## 2024-03-02 DIAGNOSIS — E669 Obesity, unspecified: Secondary | ICD-10-CM | POA: Diagnosis not present

## 2024-03-02 DIAGNOSIS — J449 Chronic obstructive pulmonary disease, unspecified: Secondary | ICD-10-CM | POA: Diagnosis not present

## 2024-03-02 DIAGNOSIS — K3189 Other diseases of stomach and duodenum: Secondary | ICD-10-CM | POA: Diagnosis not present

## 2024-03-02 DIAGNOSIS — K219 Gastro-esophageal reflux disease without esophagitis: Secondary | ICD-10-CM | POA: Diagnosis not present

## 2024-03-02 DIAGNOSIS — R001 Bradycardia, unspecified: Secondary | ICD-10-CM | POA: Diagnosis not present

## 2024-03-02 DIAGNOSIS — I4821 Permanent atrial fibrillation: Secondary | ICD-10-CM | POA: Diagnosis present

## 2024-03-02 DIAGNOSIS — K760 Fatty (change of) liver, not elsewhere classified: Secondary | ICD-10-CM | POA: Diagnosis not present

## 2024-03-02 DIAGNOSIS — J9811 Atelectasis: Secondary | ICD-10-CM | POA: Diagnosis not present

## 2024-03-02 DIAGNOSIS — T148XXA Other injury of unspecified body region, initial encounter: Secondary | ICD-10-CM | POA: Diagnosis not present

## 2024-03-02 DIAGNOSIS — E039 Hypothyroidism, unspecified: Secondary | ICD-10-CM | POA: Diagnosis not present

## 2024-03-02 DIAGNOSIS — I959 Hypotension, unspecified: Secondary | ICD-10-CM | POA: Diagnosis present

## 2024-03-02 DIAGNOSIS — K921 Melena: Secondary | ICD-10-CM | POA: Diagnosis not present

## 2024-03-02 DIAGNOSIS — E785 Hyperlipidemia, unspecified: Secondary | ICD-10-CM | POA: Diagnosis present

## 2024-03-02 DIAGNOSIS — J4489 Other specified chronic obstructive pulmonary disease: Secondary | ICD-10-CM | POA: Diagnosis present

## 2024-03-02 DIAGNOSIS — J90 Pleural effusion, not elsewhere classified: Secondary | ICD-10-CM | POA: Diagnosis not present

## 2024-03-02 DIAGNOSIS — M109 Gout, unspecified: Secondary | ICD-10-CM | POA: Diagnosis present

## 2024-03-02 DIAGNOSIS — R0902 Hypoxemia: Secondary | ICD-10-CM | POA: Diagnosis present

## 2024-03-02 DIAGNOSIS — R000 Tachycardia, unspecified: Secondary | ICD-10-CM | POA: Diagnosis not present

## 2024-03-02 DIAGNOSIS — D62 Acute posthemorrhagic anemia: Secondary | ICD-10-CM | POA: Diagnosis not present

## 2024-03-03 DIAGNOSIS — K3189 Other diseases of stomach and duodenum: Secondary | ICD-10-CM | POA: Diagnosis not present

## 2024-03-03 DIAGNOSIS — R1013 Epigastric pain: Secondary | ICD-10-CM | POA: Diagnosis not present

## 2024-03-03 DIAGNOSIS — D62 Acute posthemorrhagic anemia: Secondary | ICD-10-CM | POA: Diagnosis not present

## 2024-03-04 DIAGNOSIS — K3189 Other diseases of stomach and duodenum: Secondary | ICD-10-CM | POA: Diagnosis not present

## 2024-03-04 DIAGNOSIS — D62 Acute posthemorrhagic anemia: Secondary | ICD-10-CM | POA: Diagnosis not present

## 2024-03-05 DIAGNOSIS — D62 Acute posthemorrhagic anemia: Secondary | ICD-10-CM | POA: Diagnosis not present

## 2024-03-05 DIAGNOSIS — K3189 Other diseases of stomach and duodenum: Secondary | ICD-10-CM | POA: Diagnosis not present

## 2024-03-24 DIAGNOSIS — K760 Fatty (change of) liver, not elsewhere classified: Secondary | ICD-10-CM | POA: Diagnosis not present

## 2024-03-24 DIAGNOSIS — Z860100 Personal history of colon polyps, unspecified: Secondary | ICD-10-CM | POA: Diagnosis not present

## 2024-03-24 DIAGNOSIS — Z6841 Body Mass Index (BMI) 40.0 and over, adult: Secondary | ICD-10-CM | POA: Diagnosis not present

## 2024-03-24 DIAGNOSIS — E7800 Pure hypercholesterolemia, unspecified: Secondary | ICD-10-CM | POA: Diagnosis not present

## 2024-03-24 DIAGNOSIS — Z9181 History of falling: Secondary | ICD-10-CM | POA: Diagnosis not present

## 2024-03-24 DIAGNOSIS — Z87442 Personal history of urinary calculi: Secondary | ICD-10-CM | POA: Diagnosis not present

## 2024-03-24 DIAGNOSIS — K219 Gastro-esophageal reflux disease without esophagitis: Secondary | ICD-10-CM | POA: Diagnosis not present

## 2024-03-24 DIAGNOSIS — Z556 Problems related to health literacy: Secondary | ICD-10-CM | POA: Diagnosis not present

## 2024-03-24 DIAGNOSIS — M109 Gout, unspecified: Secondary | ICD-10-CM | POA: Diagnosis not present

## 2024-03-24 DIAGNOSIS — I5033 Acute on chronic diastolic (congestive) heart failure: Secondary | ICD-10-CM | POA: Diagnosis not present

## 2024-03-24 DIAGNOSIS — R32 Unspecified urinary incontinence: Secondary | ICD-10-CM | POA: Diagnosis not present

## 2024-03-24 DIAGNOSIS — K5790 Diverticulosis of intestine, part unspecified, without perforation or abscess without bleeding: Secondary | ICD-10-CM | POA: Diagnosis not present

## 2024-03-24 DIAGNOSIS — K5904 Chronic idiopathic constipation: Secondary | ICD-10-CM | POA: Diagnosis not present

## 2024-03-24 DIAGNOSIS — M1990 Unspecified osteoarthritis, unspecified site: Secondary | ICD-10-CM | POA: Diagnosis not present

## 2024-03-24 DIAGNOSIS — Z604 Social exclusion and rejection: Secondary | ICD-10-CM | POA: Diagnosis not present

## 2024-03-24 DIAGNOSIS — E039 Hypothyroidism, unspecified: Secondary | ICD-10-CM | POA: Diagnosis not present

## 2024-03-24 DIAGNOSIS — Z86018 Personal history of other benign neoplasm: Secondary | ICD-10-CM | POA: Diagnosis not present

## 2024-03-24 DIAGNOSIS — I4819 Other persistent atrial fibrillation: Secondary | ICD-10-CM | POA: Diagnosis not present

## 2024-03-24 DIAGNOSIS — A0472 Enterocolitis due to Clostridium difficile, not specified as recurrent: Secondary | ICD-10-CM | POA: Diagnosis not present

## 2024-03-24 DIAGNOSIS — J9611 Chronic respiratory failure with hypoxia: Secondary | ICD-10-CM | POA: Diagnosis not present

## 2024-03-24 DIAGNOSIS — D649 Anemia, unspecified: Secondary | ICD-10-CM | POA: Diagnosis not present

## 2024-03-24 DIAGNOSIS — I959 Hypotension, unspecified: Secondary | ICD-10-CM | POA: Diagnosis not present

## 2024-03-24 DIAGNOSIS — E662 Morbid (severe) obesity with alveolar hypoventilation: Secondary | ICD-10-CM | POA: Diagnosis not present

## 2024-03-24 DIAGNOSIS — S80822D Blister (nonthermal), left lower leg, subsequent encounter: Secondary | ICD-10-CM | POA: Diagnosis not present

## 2024-03-24 DIAGNOSIS — J449 Chronic obstructive pulmonary disease, unspecified: Secondary | ICD-10-CM | POA: Diagnosis not present

## 2024-03-27 DIAGNOSIS — I5033 Acute on chronic diastolic (congestive) heart failure: Secondary | ICD-10-CM | POA: Diagnosis not present

## 2024-03-27 DIAGNOSIS — J449 Chronic obstructive pulmonary disease, unspecified: Secondary | ICD-10-CM | POA: Diagnosis not present

## 2024-03-27 DIAGNOSIS — I4819 Other persistent atrial fibrillation: Secondary | ICD-10-CM | POA: Diagnosis not present

## 2024-03-27 DIAGNOSIS — Z6841 Body Mass Index (BMI) 40.0 and over, adult: Secondary | ICD-10-CM | POA: Diagnosis not present

## 2024-03-27 DIAGNOSIS — E662 Morbid (severe) obesity with alveolar hypoventilation: Secondary | ICD-10-CM | POA: Diagnosis not present

## 2024-03-27 DIAGNOSIS — J9611 Chronic respiratory failure with hypoxia: Secondary | ICD-10-CM | POA: Diagnosis not present

## 2024-03-30 DIAGNOSIS — K558 Other vascular disorders of intestine: Secondary | ICD-10-CM | POA: Diagnosis not present

## 2024-03-30 DIAGNOSIS — K5521 Angiodysplasia of colon with hemorrhage: Secondary | ICD-10-CM | POA: Diagnosis not present

## 2024-03-30 DIAGNOSIS — K219 Gastro-esophageal reflux disease without esophagitis: Secondary | ICD-10-CM | POA: Diagnosis not present

## 2024-03-30 DIAGNOSIS — I480 Paroxysmal atrial fibrillation: Secondary | ICD-10-CM | POA: Diagnosis not present

## 2024-03-30 DIAGNOSIS — S81802D Unspecified open wound, left lower leg, subsequent encounter: Secondary | ICD-10-CM | POA: Diagnosis not present

## 2024-03-30 DIAGNOSIS — Z9049 Acquired absence of other specified parts of digestive tract: Secondary | ICD-10-CM | POA: Diagnosis not present

## 2024-03-30 DIAGNOSIS — K3189 Other diseases of stomach and duodenum: Secondary | ICD-10-CM | POA: Diagnosis not present

## 2024-03-30 DIAGNOSIS — I4819 Other persistent atrial fibrillation: Secondary | ICD-10-CM | POA: Diagnosis not present

## 2024-03-30 DIAGNOSIS — G4733 Obstructive sleep apnea (adult) (pediatric): Secondary | ICD-10-CM | POA: Diagnosis not present

## 2024-03-30 DIAGNOSIS — I110 Hypertensive heart disease with heart failure: Secondary | ICD-10-CM | POA: Diagnosis not present

## 2024-03-30 DIAGNOSIS — Z7901 Long term (current) use of anticoagulants: Secondary | ICD-10-CM | POA: Diagnosis not present

## 2024-03-30 DIAGNOSIS — Z87891 Personal history of nicotine dependence: Secondary | ICD-10-CM | POA: Diagnosis not present

## 2024-03-30 DIAGNOSIS — E039 Hypothyroidism, unspecified: Secondary | ICD-10-CM | POA: Diagnosis not present

## 2024-03-30 DIAGNOSIS — I509 Heart failure, unspecified: Secondary | ICD-10-CM | POA: Diagnosis not present

## 2024-03-30 DIAGNOSIS — J9611 Chronic respiratory failure with hypoxia: Secondary | ICD-10-CM | POA: Diagnosis present

## 2024-03-30 DIAGNOSIS — I959 Hypotension, unspecified: Secondary | ICD-10-CM | POA: Diagnosis not present

## 2024-03-30 DIAGNOSIS — Z6841 Body Mass Index (BMI) 40.0 and over, adult: Secondary | ICD-10-CM | POA: Diagnosis not present

## 2024-03-30 DIAGNOSIS — I5032 Chronic diastolic (congestive) heart failure: Secondary | ICD-10-CM | POA: Diagnosis not present

## 2024-03-30 DIAGNOSIS — K31811 Angiodysplasia of stomach and duodenum with bleeding: Secondary | ICD-10-CM | POA: Diagnosis not present

## 2024-03-30 DIAGNOSIS — D62 Acute posthemorrhagic anemia: Secondary | ICD-10-CM | POA: Diagnosis not present

## 2024-03-30 DIAGNOSIS — K529 Noninfective gastroenteritis and colitis, unspecified: Secondary | ICD-10-CM | POA: Diagnosis not present

## 2024-03-30 DIAGNOSIS — Z8679 Personal history of other diseases of the circulatory system: Secondary | ICD-10-CM | POA: Diagnosis not present

## 2024-03-30 DIAGNOSIS — M109 Gout, unspecified: Secondary | ICD-10-CM | POA: Diagnosis not present

## 2024-03-30 DIAGNOSIS — J449 Chronic obstructive pulmonary disease, unspecified: Secondary | ICD-10-CM | POA: Diagnosis not present

## 2024-03-30 DIAGNOSIS — K922 Gastrointestinal hemorrhage, unspecified: Secondary | ICD-10-CM | POA: Diagnosis not present

## 2024-03-30 DIAGNOSIS — Z9079 Acquired absence of other genital organ(s): Secondary | ICD-10-CM | POA: Diagnosis not present

## 2024-03-30 DIAGNOSIS — I4891 Unspecified atrial fibrillation: Secondary | ICD-10-CM | POA: Diagnosis not present

## 2024-03-30 DIAGNOSIS — E785 Hyperlipidemia, unspecified: Secondary | ICD-10-CM | POA: Diagnosis not present

## 2024-03-30 DIAGNOSIS — Z888 Allergy status to other drugs, medicaments and biological substances status: Secondary | ICD-10-CM | POA: Diagnosis not present

## 2024-03-30 DIAGNOSIS — I5033 Acute on chronic diastolic (congestive) heart failure: Secondary | ICD-10-CM | POA: Diagnosis not present

## 2024-03-30 DIAGNOSIS — K31819 Angiodysplasia of stomach and duodenum without bleeding: Secondary | ICD-10-CM | POA: Diagnosis not present

## 2024-03-30 DIAGNOSIS — J4489 Other specified chronic obstructive pulmonary disease: Secondary | ICD-10-CM | POA: Diagnosis not present

## 2024-03-30 DIAGNOSIS — Z87898 Personal history of other specified conditions: Secondary | ICD-10-CM | POA: Diagnosis not present

## 2024-03-30 DIAGNOSIS — Z8709 Personal history of other diseases of the respiratory system: Secondary | ICD-10-CM | POA: Diagnosis not present

## 2024-03-30 DIAGNOSIS — K921 Melena: Secondary | ICD-10-CM | POA: Diagnosis not present

## 2024-03-30 DIAGNOSIS — R531 Weakness: Secondary | ICD-10-CM | POA: Diagnosis not present

## 2024-03-30 DIAGNOSIS — D649 Anemia, unspecified: Secondary | ICD-10-CM | POA: Diagnosis not present

## 2024-03-30 DIAGNOSIS — D689 Coagulation defect, unspecified: Secondary | ICD-10-CM | POA: Diagnosis not present

## 2024-03-30 DIAGNOSIS — Z9981 Dependence on supplemental oxygen: Secondary | ICD-10-CM | POA: Diagnosis not present

## 2024-03-30 DIAGNOSIS — R54 Age-related physical debility: Secondary | ICD-10-CM | POA: Diagnosis not present

## 2024-03-30 DIAGNOSIS — I774 Celiac artery compression syndrome: Secondary | ICD-10-CM | POA: Diagnosis not present

## 2024-03-30 DIAGNOSIS — K5730 Diverticulosis of large intestine without perforation or abscess without bleeding: Secondary | ICD-10-CM | POA: Diagnosis not present

## 2024-03-31 DIAGNOSIS — K921 Melena: Secondary | ICD-10-CM | POA: Diagnosis not present

## 2024-04-01 DIAGNOSIS — K558 Other vascular disorders of intestine: Secondary | ICD-10-CM | POA: Diagnosis not present

## 2024-04-01 DIAGNOSIS — G4733 Obstructive sleep apnea (adult) (pediatric): Secondary | ICD-10-CM | POA: Diagnosis not present

## 2024-04-01 DIAGNOSIS — I5032 Chronic diastolic (congestive) heart failure: Secondary | ICD-10-CM | POA: Diagnosis not present

## 2024-04-01 DIAGNOSIS — K31819 Angiodysplasia of stomach and duodenum without bleeding: Secondary | ICD-10-CM | POA: Diagnosis not present

## 2024-04-01 DIAGNOSIS — K5521 Angiodysplasia of colon with hemorrhage: Secondary | ICD-10-CM | POA: Diagnosis not present

## 2024-04-01 DIAGNOSIS — K921 Melena: Secondary | ICD-10-CM | POA: Diagnosis not present

## 2024-04-01 DIAGNOSIS — J4489 Other specified chronic obstructive pulmonary disease: Secondary | ICD-10-CM | POA: Diagnosis not present

## 2024-04-01 DIAGNOSIS — I4891 Unspecified atrial fibrillation: Secondary | ICD-10-CM | POA: Diagnosis not present

## 2024-04-01 DIAGNOSIS — K219 Gastro-esophageal reflux disease without esophagitis: Secondary | ICD-10-CM | POA: Diagnosis not present

## 2024-04-02 DIAGNOSIS — K921 Melena: Secondary | ICD-10-CM | POA: Diagnosis not present

## 2024-04-02 DIAGNOSIS — K558 Other vascular disorders of intestine: Secondary | ICD-10-CM | POA: Diagnosis not present

## 2024-04-02 DIAGNOSIS — K3189 Other diseases of stomach and duodenum: Secondary | ICD-10-CM | POA: Diagnosis not present

## 2024-04-03 DIAGNOSIS — J449 Chronic obstructive pulmonary disease, unspecified: Secondary | ICD-10-CM | POA: Diagnosis not present

## 2024-04-03 DIAGNOSIS — K3189 Other diseases of stomach and duodenum: Secondary | ICD-10-CM | POA: Diagnosis not present

## 2024-04-03 DIAGNOSIS — K921 Melena: Secondary | ICD-10-CM | POA: Diagnosis not present

## 2024-04-03 DIAGNOSIS — K558 Other vascular disorders of intestine: Secondary | ICD-10-CM | POA: Diagnosis not present

## 2024-04-03 DIAGNOSIS — I509 Heart failure, unspecified: Secondary | ICD-10-CM | POA: Diagnosis not present

## 2024-04-03 DIAGNOSIS — K922 Gastrointestinal hemorrhage, unspecified: Secondary | ICD-10-CM | POA: Diagnosis not present

## 2024-04-03 DIAGNOSIS — I4819 Other persistent atrial fibrillation: Secondary | ICD-10-CM | POA: Diagnosis not present

## 2024-04-03 DIAGNOSIS — J9611 Chronic respiratory failure with hypoxia: Secondary | ICD-10-CM | POA: Diagnosis not present

## 2024-04-04 DIAGNOSIS — K921 Melena: Secondary | ICD-10-CM | POA: Diagnosis not present

## 2024-04-05 DIAGNOSIS — K921 Melena: Secondary | ICD-10-CM | POA: Diagnosis not present

## 2024-04-06 DIAGNOSIS — K921 Melena: Secondary | ICD-10-CM | POA: Diagnosis not present

## 2024-04-07 DIAGNOSIS — E876 Hypokalemia: Secondary | ICD-10-CM | POA: Diagnosis not present

## 2024-04-07 DIAGNOSIS — S81812D Laceration without foreign body, left lower leg, subsequent encounter: Secondary | ICD-10-CM | POA: Diagnosis not present

## 2024-04-07 DIAGNOSIS — Z8619 Personal history of other infectious and parasitic diseases: Secondary | ICD-10-CM | POA: Diagnosis not present

## 2024-04-07 DIAGNOSIS — D500 Iron deficiency anemia secondary to blood loss (chronic): Secondary | ICD-10-CM | POA: Diagnosis not present

## 2024-04-07 DIAGNOSIS — I5030 Unspecified diastolic (congestive) heart failure: Secondary | ICD-10-CM | POA: Diagnosis not present

## 2024-04-07 DIAGNOSIS — I480 Paroxysmal atrial fibrillation: Secondary | ICD-10-CM | POA: Diagnosis not present

## 2024-04-07 DIAGNOSIS — F419 Anxiety disorder, unspecified: Secondary | ICD-10-CM | POA: Diagnosis not present

## 2024-04-07 DIAGNOSIS — R5381 Other malaise: Secondary | ICD-10-CM | POA: Diagnosis not present

## 2024-04-07 DIAGNOSIS — E66813 Obesity, class 3: Secondary | ICD-10-CM | POA: Diagnosis not present

## 2024-04-07 DIAGNOSIS — I4819 Other persistent atrial fibrillation: Secondary | ICD-10-CM | POA: Diagnosis not present

## 2024-04-07 DIAGNOSIS — R5383 Other fatigue: Secondary | ICD-10-CM | POA: Diagnosis not present

## 2024-04-07 DIAGNOSIS — R531 Weakness: Secondary | ICD-10-CM | POA: Diagnosis not present

## 2024-04-07 DIAGNOSIS — K5521 Angiodysplasia of colon with hemorrhage: Secondary | ICD-10-CM | POA: Diagnosis not present

## 2024-04-07 DIAGNOSIS — M10071 Idiopathic gout, right ankle and foot: Secondary | ICD-10-CM | POA: Diagnosis not present

## 2024-04-07 DIAGNOSIS — M10072 Idiopathic gout, left ankle and foot: Secondary | ICD-10-CM | POA: Diagnosis not present

## 2024-04-07 DIAGNOSIS — R110 Nausea: Secondary | ICD-10-CM | POA: Diagnosis not present

## 2024-04-07 DIAGNOSIS — I5032 Chronic diastolic (congestive) heart failure: Secondary | ICD-10-CM | POA: Diagnosis not present

## 2024-04-07 DIAGNOSIS — Z7401 Bed confinement status: Secondary | ICD-10-CM | POA: Diagnosis not present

## 2024-04-07 DIAGNOSIS — K921 Melena: Secondary | ICD-10-CM | POA: Diagnosis not present

## 2024-04-07 DIAGNOSIS — Q2733 Arteriovenous malformation of digestive system vessel: Secondary | ICD-10-CM | POA: Diagnosis not present

## 2024-04-07 DIAGNOSIS — I959 Hypotension, unspecified: Secondary | ICD-10-CM | POA: Diagnosis not present

## 2024-04-07 DIAGNOSIS — J449 Chronic obstructive pulmonary disease, unspecified: Secondary | ICD-10-CM | POA: Diagnosis not present

## 2024-04-07 DIAGNOSIS — L89150 Pressure ulcer of sacral region, unstageable: Secondary | ICD-10-CM | POA: Diagnosis not present

## 2024-04-07 DIAGNOSIS — D62 Acute posthemorrhagic anemia: Secondary | ICD-10-CM | POA: Diagnosis not present

## 2024-04-07 DIAGNOSIS — E039 Hypothyroidism, unspecified: Secondary | ICD-10-CM | POA: Diagnosis not present

## 2024-04-07 DIAGNOSIS — Z9981 Dependence on supplemental oxygen: Secondary | ICD-10-CM | POA: Diagnosis not present

## 2024-04-07 DIAGNOSIS — Z6841 Body Mass Index (BMI) 40.0 and over, adult: Secondary | ICD-10-CM | POA: Diagnosis not present

## 2024-04-07 DIAGNOSIS — R630 Anorexia: Secondary | ICD-10-CM | POA: Diagnosis not present

## 2024-04-07 DIAGNOSIS — Q2730 Arteriovenous malformation, site unspecified: Secondary | ICD-10-CM | POA: Diagnosis not present

## 2024-04-07 DIAGNOSIS — J9611 Chronic respiratory failure with hypoxia: Secondary | ICD-10-CM | POA: Diagnosis not present

## 2024-04-07 DIAGNOSIS — K5900 Constipation, unspecified: Secondary | ICD-10-CM | POA: Diagnosis not present

## 2024-04-07 DIAGNOSIS — R1011 Right upper quadrant pain: Secondary | ICD-10-CM | POA: Diagnosis not present

## 2024-04-08 DIAGNOSIS — I480 Paroxysmal atrial fibrillation: Secondary | ICD-10-CM | POA: Diagnosis not present

## 2024-04-08 DIAGNOSIS — J449 Chronic obstructive pulmonary disease, unspecified: Secondary | ICD-10-CM | POA: Diagnosis not present

## 2024-04-08 DIAGNOSIS — I5030 Unspecified diastolic (congestive) heart failure: Secondary | ICD-10-CM | POA: Diagnosis not present

## 2024-04-08 DIAGNOSIS — Q2733 Arteriovenous malformation of digestive system vessel: Secondary | ICD-10-CM | POA: Diagnosis not present

## 2024-04-08 DIAGNOSIS — D62 Acute posthemorrhagic anemia: Secondary | ICD-10-CM | POA: Diagnosis not present

## 2024-04-09 DIAGNOSIS — Q2733 Arteriovenous malformation of digestive system vessel: Secondary | ICD-10-CM | POA: Diagnosis not present

## 2024-04-09 DIAGNOSIS — J449 Chronic obstructive pulmonary disease, unspecified: Secondary | ICD-10-CM | POA: Diagnosis not present

## 2024-04-09 DIAGNOSIS — I5030 Unspecified diastolic (congestive) heart failure: Secondary | ICD-10-CM | POA: Diagnosis not present

## 2024-04-09 DIAGNOSIS — D62 Acute posthemorrhagic anemia: Secondary | ICD-10-CM | POA: Diagnosis not present

## 2024-04-09 DIAGNOSIS — I480 Paroxysmal atrial fibrillation: Secondary | ICD-10-CM | POA: Diagnosis not present

## 2024-04-10 DIAGNOSIS — J449 Chronic obstructive pulmonary disease, unspecified: Secondary | ICD-10-CM | POA: Diagnosis not present

## 2024-04-10 DIAGNOSIS — I480 Paroxysmal atrial fibrillation: Secondary | ICD-10-CM | POA: Diagnosis not present

## 2024-04-10 DIAGNOSIS — D62 Acute posthemorrhagic anemia: Secondary | ICD-10-CM | POA: Diagnosis not present

## 2024-04-10 DIAGNOSIS — I5030 Unspecified diastolic (congestive) heart failure: Secondary | ICD-10-CM | POA: Diagnosis not present

## 2024-04-10 DIAGNOSIS — Q2733 Arteriovenous malformation of digestive system vessel: Secondary | ICD-10-CM | POA: Diagnosis not present

## 2024-04-11 DIAGNOSIS — Q2733 Arteriovenous malformation of digestive system vessel: Secondary | ICD-10-CM | POA: Diagnosis not present

## 2024-04-11 DIAGNOSIS — I480 Paroxysmal atrial fibrillation: Secondary | ICD-10-CM | POA: Diagnosis not present

## 2024-04-11 DIAGNOSIS — D62 Acute posthemorrhagic anemia: Secondary | ICD-10-CM | POA: Diagnosis not present

## 2024-04-11 DIAGNOSIS — I5030 Unspecified diastolic (congestive) heart failure: Secondary | ICD-10-CM | POA: Diagnosis not present

## 2024-04-11 DIAGNOSIS — J449 Chronic obstructive pulmonary disease, unspecified: Secondary | ICD-10-CM | POA: Diagnosis not present

## 2024-04-21 DIAGNOSIS — I4819 Other persistent atrial fibrillation: Secondary | ICD-10-CM | POA: Diagnosis not present

## 2024-04-21 DIAGNOSIS — Z6841 Body Mass Index (BMI) 40.0 and over, adult: Secondary | ICD-10-CM | POA: Diagnosis not present

## 2024-04-21 DIAGNOSIS — J449 Chronic obstructive pulmonary disease, unspecified: Secondary | ICD-10-CM | POA: Diagnosis not present

## 2024-04-21 DIAGNOSIS — I5033 Acute on chronic diastolic (congestive) heart failure: Secondary | ICD-10-CM | POA: Diagnosis not present

## 2024-04-21 DIAGNOSIS — E662 Morbid (severe) obesity with alveolar hypoventilation: Secondary | ICD-10-CM | POA: Diagnosis not present

## 2024-04-21 DIAGNOSIS — J9611 Chronic respiratory failure with hypoxia: Secondary | ICD-10-CM | POA: Diagnosis not present

## 2024-04-22 DIAGNOSIS — I5033 Acute on chronic diastolic (congestive) heart failure: Secondary | ICD-10-CM | POA: Diagnosis not present

## 2024-04-22 DIAGNOSIS — E662 Morbid (severe) obesity with alveolar hypoventilation: Secondary | ICD-10-CM | POA: Diagnosis not present

## 2024-04-22 DIAGNOSIS — I4819 Other persistent atrial fibrillation: Secondary | ICD-10-CM | POA: Diagnosis not present

## 2024-04-22 DIAGNOSIS — Z6841 Body Mass Index (BMI) 40.0 and over, adult: Secondary | ICD-10-CM | POA: Diagnosis not present

## 2024-04-22 DIAGNOSIS — J9611 Chronic respiratory failure with hypoxia: Secondary | ICD-10-CM | POA: Diagnosis not present

## 2024-04-22 DIAGNOSIS — J449 Chronic obstructive pulmonary disease, unspecified: Secondary | ICD-10-CM | POA: Diagnosis not present

## 2024-04-23 DIAGNOSIS — E039 Hypothyroidism, unspecified: Secondary | ICD-10-CM | POA: Diagnosis not present

## 2024-04-23 DIAGNOSIS — M1990 Unspecified osteoarthritis, unspecified site: Secondary | ICD-10-CM | POA: Diagnosis not present

## 2024-04-23 DIAGNOSIS — K5904 Chronic idiopathic constipation: Secondary | ICD-10-CM | POA: Diagnosis not present

## 2024-04-23 DIAGNOSIS — S80822D Blister (nonthermal), left lower leg, subsequent encounter: Secondary | ICD-10-CM | POA: Diagnosis not present

## 2024-04-23 DIAGNOSIS — K921 Melena: Secondary | ICD-10-CM | POA: Diagnosis not present

## 2024-04-23 DIAGNOSIS — Z9181 History of falling: Secondary | ICD-10-CM | POA: Diagnosis not present

## 2024-04-23 DIAGNOSIS — E662 Morbid (severe) obesity with alveolar hypoventilation: Secondary | ICD-10-CM | POA: Diagnosis not present

## 2024-04-23 DIAGNOSIS — Z87442 Personal history of urinary calculi: Secondary | ICD-10-CM | POA: Diagnosis not present

## 2024-04-23 DIAGNOSIS — K219 Gastro-esophageal reflux disease without esophagitis: Secondary | ICD-10-CM | POA: Diagnosis not present

## 2024-04-23 DIAGNOSIS — K760 Fatty (change of) liver, not elsewhere classified: Secondary | ICD-10-CM | POA: Diagnosis not present

## 2024-04-23 DIAGNOSIS — Z86018 Personal history of other benign neoplasm: Secondary | ICD-10-CM | POA: Diagnosis not present

## 2024-04-23 DIAGNOSIS — D509 Iron deficiency anemia, unspecified: Secondary | ICD-10-CM | POA: Diagnosis not present

## 2024-04-23 DIAGNOSIS — I5033 Acute on chronic diastolic (congestive) heart failure: Secondary | ICD-10-CM | POA: Diagnosis not present

## 2024-04-23 DIAGNOSIS — Z556 Problems related to health literacy: Secondary | ICD-10-CM | POA: Diagnosis not present

## 2024-04-23 DIAGNOSIS — I4819 Other persistent atrial fibrillation: Secondary | ICD-10-CM | POA: Diagnosis not present

## 2024-04-23 DIAGNOSIS — I110 Hypertensive heart disease with heart failure: Secondary | ICD-10-CM | POA: Diagnosis not present

## 2024-04-23 DIAGNOSIS — Z6841 Body Mass Index (BMI) 40.0 and over, adult: Secondary | ICD-10-CM | POA: Diagnosis not present

## 2024-04-23 DIAGNOSIS — J9611 Chronic respiratory failure with hypoxia: Secondary | ICD-10-CM | POA: Diagnosis not present

## 2024-04-23 DIAGNOSIS — R32 Unspecified urinary incontinence: Secondary | ICD-10-CM | POA: Diagnosis not present

## 2024-04-23 DIAGNOSIS — J449 Chronic obstructive pulmonary disease, unspecified: Secondary | ICD-10-CM | POA: Diagnosis not present

## 2024-04-23 DIAGNOSIS — M109 Gout, unspecified: Secondary | ICD-10-CM | POA: Diagnosis not present

## 2024-04-23 DIAGNOSIS — K5790 Diverticulosis of intestine, part unspecified, without perforation or abscess without bleeding: Secondary | ICD-10-CM | POA: Diagnosis not present

## 2024-04-23 DIAGNOSIS — D62 Acute posthemorrhagic anemia: Secondary | ICD-10-CM | POA: Diagnosis not present

## 2024-04-23 DIAGNOSIS — E7800 Pure hypercholesterolemia, unspecified: Secondary | ICD-10-CM | POA: Diagnosis not present

## 2024-04-23 DIAGNOSIS — K5521 Angiodysplasia of colon with hemorrhage: Secondary | ICD-10-CM | POA: Diagnosis not present

## 2024-04-28 ENCOUNTER — Encounter: Payer: Self-pay | Admitting: Internal Medicine

## 2024-04-28 DIAGNOSIS — R54 Age-related physical debility: Secondary | ICD-10-CM | POA: Diagnosis not present

## 2024-04-28 DIAGNOSIS — I5032 Chronic diastolic (congestive) heart failure: Secondary | ICD-10-CM | POA: Diagnosis not present

## 2024-04-28 DIAGNOSIS — Z7189 Other specified counseling: Secondary | ICD-10-CM | POA: Diagnosis not present

## 2024-04-28 DIAGNOSIS — I110 Hypertensive heart disease with heart failure: Secondary | ICD-10-CM | POA: Diagnosis not present

## 2024-04-28 DIAGNOSIS — J449 Chronic obstructive pulmonary disease, unspecified: Secondary | ICD-10-CM | POA: Diagnosis not present

## 2024-04-28 DIAGNOSIS — K529 Noninfective gastroenteritis and colitis, unspecified: Secondary | ICD-10-CM | POA: Diagnosis not present

## 2024-04-28 DIAGNOSIS — I4819 Other persistent atrial fibrillation: Secondary | ICD-10-CM | POA: Diagnosis not present

## 2024-04-28 DIAGNOSIS — E662 Morbid (severe) obesity with alveolar hypoventilation: Secondary | ICD-10-CM | POA: Diagnosis not present

## 2024-04-28 DIAGNOSIS — I5033 Acute on chronic diastolic (congestive) heart failure: Secondary | ICD-10-CM | POA: Diagnosis not present

## 2024-04-28 DIAGNOSIS — D6869 Other thrombophilia: Secondary | ICD-10-CM | POA: Diagnosis not present

## 2024-04-28 DIAGNOSIS — Z0001 Encounter for general adult medical examination with abnormal findings: Secondary | ICD-10-CM | POA: Diagnosis not present

## 2024-04-28 DIAGNOSIS — J9611 Chronic respiratory failure with hypoxia: Secondary | ICD-10-CM | POA: Diagnosis not present

## 2024-04-30 DIAGNOSIS — I110 Hypertensive heart disease with heart failure: Secondary | ICD-10-CM | POA: Diagnosis not present

## 2024-04-30 DIAGNOSIS — E662 Morbid (severe) obesity with alveolar hypoventilation: Secondary | ICD-10-CM | POA: Diagnosis not present

## 2024-04-30 DIAGNOSIS — I4819 Other persistent atrial fibrillation: Secondary | ICD-10-CM | POA: Diagnosis not present

## 2024-04-30 DIAGNOSIS — I5033 Acute on chronic diastolic (congestive) heart failure: Secondary | ICD-10-CM | POA: Diagnosis not present

## 2024-04-30 DIAGNOSIS — J449 Chronic obstructive pulmonary disease, unspecified: Secondary | ICD-10-CM | POA: Diagnosis not present

## 2024-04-30 DIAGNOSIS — J9611 Chronic respiratory failure with hypoxia: Secondary | ICD-10-CM | POA: Diagnosis not present

## 2024-05-01 DIAGNOSIS — J9611 Chronic respiratory failure with hypoxia: Secondary | ICD-10-CM | POA: Diagnosis not present

## 2024-05-01 DIAGNOSIS — I110 Hypertensive heart disease with heart failure: Secondary | ICD-10-CM | POA: Diagnosis not present

## 2024-05-01 DIAGNOSIS — E662 Morbid (severe) obesity with alveolar hypoventilation: Secondary | ICD-10-CM | POA: Diagnosis not present

## 2024-05-01 DIAGNOSIS — I4819 Other persistent atrial fibrillation: Secondary | ICD-10-CM | POA: Diagnosis not present

## 2024-05-01 DIAGNOSIS — J449 Chronic obstructive pulmonary disease, unspecified: Secondary | ICD-10-CM | POA: Diagnosis not present

## 2024-05-01 DIAGNOSIS — I5033 Acute on chronic diastolic (congestive) heart failure: Secondary | ICD-10-CM | POA: Diagnosis not present

## 2024-05-05 DIAGNOSIS — E662 Morbid (severe) obesity with alveolar hypoventilation: Secondary | ICD-10-CM | POA: Diagnosis not present

## 2024-05-05 DIAGNOSIS — Z0001 Encounter for general adult medical examination with abnormal findings: Secondary | ICD-10-CM | POA: Diagnosis not present

## 2024-05-05 DIAGNOSIS — I4819 Other persistent atrial fibrillation: Secondary | ICD-10-CM | POA: Diagnosis not present

## 2024-05-05 DIAGNOSIS — I110 Hypertensive heart disease with heart failure: Secondary | ICD-10-CM | POA: Diagnosis not present

## 2024-05-05 DIAGNOSIS — R54 Age-related physical debility: Secondary | ICD-10-CM | POA: Diagnosis not present

## 2024-05-05 DIAGNOSIS — J9611 Chronic respiratory failure with hypoxia: Secondary | ICD-10-CM | POA: Diagnosis not present

## 2024-05-05 DIAGNOSIS — J449 Chronic obstructive pulmonary disease, unspecified: Secondary | ICD-10-CM | POA: Diagnosis not present

## 2024-05-05 DIAGNOSIS — Z0000 Encounter for general adult medical examination without abnormal findings: Secondary | ICD-10-CM | POA: Diagnosis not present

## 2024-05-05 DIAGNOSIS — E559 Vitamin D deficiency, unspecified: Secondary | ICD-10-CM | POA: Diagnosis not present

## 2024-05-05 DIAGNOSIS — I5033 Acute on chronic diastolic (congestive) heart failure: Secondary | ICD-10-CM | POA: Diagnosis not present

## 2024-05-07 DIAGNOSIS — D649 Anemia, unspecified: Secondary | ICD-10-CM | POA: Diagnosis not present

## 2024-05-07 DIAGNOSIS — N182 Chronic kidney disease, stage 2 (mild): Secondary | ICD-10-CM | POA: Diagnosis not present

## 2024-05-07 DIAGNOSIS — I4819 Other persistent atrial fibrillation: Secondary | ICD-10-CM | POA: Diagnosis not present

## 2024-05-07 DIAGNOSIS — I110 Hypertensive heart disease with heart failure: Secondary | ICD-10-CM | POA: Diagnosis not present

## 2024-05-07 DIAGNOSIS — E871 Hypo-osmolality and hyponatremia: Secondary | ICD-10-CM | POA: Diagnosis not present

## 2024-05-07 DIAGNOSIS — N179 Acute kidney failure, unspecified: Secondary | ICD-10-CM | POA: Diagnosis present

## 2024-05-07 DIAGNOSIS — J449 Chronic obstructive pulmonary disease, unspecified: Secondary | ICD-10-CM | POA: Diagnosis not present

## 2024-05-07 DIAGNOSIS — A0471 Enterocolitis due to Clostridium difficile, recurrent: Secondary | ICD-10-CM | POA: Diagnosis not present

## 2024-05-07 DIAGNOSIS — E785 Hyperlipidemia, unspecified: Secondary | ICD-10-CM | POA: Diagnosis not present

## 2024-05-07 DIAGNOSIS — E662 Morbid (severe) obesity with alveolar hypoventilation: Secondary | ICD-10-CM | POA: Diagnosis not present

## 2024-05-07 DIAGNOSIS — N200 Calculus of kidney: Secondary | ICD-10-CM | POA: Diagnosis not present

## 2024-05-07 DIAGNOSIS — I959 Hypotension, unspecified: Secondary | ICD-10-CM | POA: Diagnosis not present

## 2024-05-07 DIAGNOSIS — I5033 Acute on chronic diastolic (congestive) heart failure: Secondary | ICD-10-CM | POA: Diagnosis not present

## 2024-05-07 DIAGNOSIS — J45909 Unspecified asthma, uncomplicated: Secondary | ICD-10-CM | POA: Diagnosis not present

## 2024-05-07 DIAGNOSIS — I130 Hypertensive heart and chronic kidney disease with heart failure and stage 1 through stage 4 chronic kidney disease, or unspecified chronic kidney disease: Secondary | ICD-10-CM | POA: Diagnosis not present

## 2024-05-07 DIAGNOSIS — E039 Hypothyroidism, unspecified: Secondary | ICD-10-CM | POA: Diagnosis not present

## 2024-05-07 DIAGNOSIS — J9611 Chronic respiratory failure with hypoxia: Secondary | ICD-10-CM | POA: Diagnosis present

## 2024-05-07 DIAGNOSIS — Z6841 Body Mass Index (BMI) 40.0 and over, adult: Secondary | ICD-10-CM | POA: Diagnosis not present

## 2024-05-07 DIAGNOSIS — K5730 Diverticulosis of large intestine without perforation or abscess without bleeding: Secondary | ICD-10-CM | POA: Diagnosis not present

## 2024-05-07 DIAGNOSIS — I38 Endocarditis, valve unspecified: Secondary | ICD-10-CM | POA: Diagnosis not present

## 2024-05-07 DIAGNOSIS — R630 Anorexia: Secondary | ICD-10-CM | POA: Diagnosis not present

## 2024-05-07 DIAGNOSIS — K219 Gastro-esophageal reflux disease without esophagitis: Secondary | ICD-10-CM | POA: Diagnosis not present

## 2024-05-07 DIAGNOSIS — E440 Moderate protein-calorie malnutrition: Secondary | ICD-10-CM | POA: Diagnosis not present

## 2024-05-07 DIAGNOSIS — K760 Fatty (change of) liver, not elsewhere classified: Secondary | ICD-10-CM | POA: Diagnosis not present

## 2024-05-07 DIAGNOSIS — F32A Depression, unspecified: Secondary | ICD-10-CM | POA: Diagnosis not present

## 2024-05-07 DIAGNOSIS — R188 Other ascites: Secondary | ICD-10-CM | POA: Diagnosis present

## 2024-05-07 DIAGNOSIS — E8809 Other disorders of plasma-protein metabolism, not elsewhere classified: Secondary | ICD-10-CM | POA: Diagnosis not present

## 2024-05-07 DIAGNOSIS — K529 Noninfective gastroenteritis and colitis, unspecified: Secondary | ICD-10-CM | POA: Diagnosis not present

## 2024-05-07 DIAGNOSIS — I5032 Chronic diastolic (congestive) heart failure: Secondary | ICD-10-CM | POA: Diagnosis present

## 2024-05-07 DIAGNOSIS — A045 Campylobacter enteritis: Secondary | ICD-10-CM | POA: Diagnosis not present

## 2024-05-07 DIAGNOSIS — R7989 Other specified abnormal findings of blood chemistry: Secondary | ICD-10-CM | POA: Diagnosis not present

## 2024-05-07 DIAGNOSIS — R110 Nausea: Secondary | ICD-10-CM | POA: Diagnosis not present

## 2024-05-07 DIAGNOSIS — I480 Paroxysmal atrial fibrillation: Secondary | ICD-10-CM | POA: Diagnosis not present

## 2024-05-07 DIAGNOSIS — R197 Diarrhea, unspecified: Secondary | ICD-10-CM | POA: Diagnosis not present

## 2024-05-07 DIAGNOSIS — Z7401 Bed confinement status: Secondary | ICD-10-CM | POA: Diagnosis not present

## 2024-05-09 ENCOUNTER — Other Ambulatory Visit (HOSPITAL_COMMUNITY): Payer: Self-pay

## 2024-05-09 MED ORDER — DIFICID 200 MG PO TABS
200.0000 mg | ORAL_TABLET | Freq: Two times a day (BID) | ORAL | 0 refills | Status: AC
  Filled 2024-05-09: qty 20, 10d supply, fill #0

## 2024-05-09 MED ORDER — AZITHROMYCIN 500 MG PO TABS
ORAL_TABLET | ORAL | 0 refills | Status: AC
  Filled 2024-05-09: qty 1, 1d supply, fill #0

## 2024-05-12 DIAGNOSIS — E662 Morbid (severe) obesity with alveolar hypoventilation: Secondary | ICD-10-CM | POA: Diagnosis not present

## 2024-05-12 DIAGNOSIS — J449 Chronic obstructive pulmonary disease, unspecified: Secondary | ICD-10-CM | POA: Diagnosis not present

## 2024-05-12 DIAGNOSIS — J9611 Chronic respiratory failure with hypoxia: Secondary | ICD-10-CM | POA: Diagnosis not present

## 2024-05-12 DIAGNOSIS — I5033 Acute on chronic diastolic (congestive) heart failure: Secondary | ICD-10-CM | POA: Diagnosis not present

## 2024-05-12 DIAGNOSIS — I4819 Other persistent atrial fibrillation: Secondary | ICD-10-CM | POA: Diagnosis not present

## 2024-05-12 DIAGNOSIS — I110 Hypertensive heart disease with heart failure: Secondary | ICD-10-CM | POA: Diagnosis not present

## 2024-05-14 DIAGNOSIS — Z79899 Other long term (current) drug therapy: Secondary | ICD-10-CM | POA: Diagnosis not present

## 2024-05-14 DIAGNOSIS — A0472 Enterocolitis due to Clostridium difficile, not specified as recurrent: Secondary | ICD-10-CM | POA: Diagnosis not present

## 2024-05-14 DIAGNOSIS — J449 Chronic obstructive pulmonary disease, unspecified: Secondary | ICD-10-CM | POA: Diagnosis not present

## 2024-05-14 DIAGNOSIS — Z87891 Personal history of nicotine dependence: Secondary | ICD-10-CM | POA: Diagnosis not present

## 2024-05-14 DIAGNOSIS — J9611 Chronic respiratory failure with hypoxia: Secondary | ICD-10-CM | POA: Diagnosis not present

## 2024-05-14 DIAGNOSIS — R627 Adult failure to thrive: Secondary | ICD-10-CM | POA: Diagnosis not present

## 2024-05-15 DIAGNOSIS — I4891 Unspecified atrial fibrillation: Secondary | ICD-10-CM | POA: Diagnosis not present

## 2024-05-15 DIAGNOSIS — Z79899 Other long term (current) drug therapy: Secondary | ICD-10-CM | POA: Diagnosis not present

## 2024-05-15 DIAGNOSIS — J449 Chronic obstructive pulmonary disease, unspecified: Secondary | ICD-10-CM | POA: Diagnosis not present

## 2024-05-15 DIAGNOSIS — R404 Transient alteration of awareness: Secondary | ICD-10-CM | POA: Diagnosis not present

## 2024-05-15 DIAGNOSIS — J9611 Chronic respiratory failure with hypoxia: Secondary | ICD-10-CM | POA: Diagnosis not present

## 2024-05-15 DIAGNOSIS — Z7982 Long term (current) use of aspirin: Secondary | ICD-10-CM | POA: Diagnosis not present

## 2024-05-15 DIAGNOSIS — I4819 Other persistent atrial fibrillation: Secondary | ICD-10-CM | POA: Diagnosis not present

## 2024-05-15 DIAGNOSIS — I509 Heart failure, unspecified: Secondary | ICD-10-CM | POA: Diagnosis not present

## 2024-05-15 DIAGNOSIS — Z87891 Personal history of nicotine dependence: Secondary | ICD-10-CM | POA: Diagnosis not present

## 2024-05-15 DIAGNOSIS — I5033 Acute on chronic diastolic (congestive) heart failure: Secondary | ICD-10-CM | POA: Diagnosis not present

## 2024-05-15 DIAGNOSIS — K219 Gastro-esophageal reflux disease without esophagitis: Secondary | ICD-10-CM | POA: Diagnosis not present

## 2024-05-15 DIAGNOSIS — I959 Hypotension, unspecified: Secondary | ICD-10-CM | POA: Diagnosis not present

## 2024-05-15 DIAGNOSIS — Z79890 Hormone replacement therapy (postmenopausal): Secondary | ICD-10-CM | POA: Diagnosis not present

## 2024-05-15 DIAGNOSIS — R4182 Altered mental status, unspecified: Secondary | ICD-10-CM | POA: Diagnosis not present

## 2024-05-15 DIAGNOSIS — E662 Morbid (severe) obesity with alveolar hypoventilation: Secondary | ICD-10-CM | POA: Diagnosis not present

## 2024-05-15 DIAGNOSIS — R197 Diarrhea, unspecified: Secondary | ICD-10-CM | POA: Diagnosis not present

## 2024-05-15 DIAGNOSIS — E079 Disorder of thyroid, unspecified: Secondary | ICD-10-CM | POA: Diagnosis not present

## 2024-05-15 DIAGNOSIS — Z7401 Bed confinement status: Secondary | ICD-10-CM | POA: Diagnosis not present

## 2024-05-15 DIAGNOSIS — R531 Weakness: Secondary | ICD-10-CM | POA: Diagnosis not present

## 2024-05-15 DIAGNOSIS — Z515 Encounter for palliative care: Secondary | ICD-10-CM | POA: Diagnosis not present

## 2024-05-15 DIAGNOSIS — I110 Hypertensive heart disease with heart failure: Secondary | ICD-10-CM | POA: Diagnosis not present

## 2024-05-15 DIAGNOSIS — R55 Syncope and collapse: Secondary | ICD-10-CM | POA: Diagnosis not present

## 2024-05-15 DIAGNOSIS — R062 Wheezing: Secondary | ICD-10-CM | POA: Diagnosis not present

## 2024-05-15 DIAGNOSIS — R195 Other fecal abnormalities: Secondary | ICD-10-CM | POA: Diagnosis not present

## 2024-05-15 DIAGNOSIS — R58 Hemorrhage, not elsewhere classified: Secondary | ICD-10-CM | POA: Diagnosis not present

## 2024-05-18 DIAGNOSIS — I480 Paroxysmal atrial fibrillation: Secondary | ICD-10-CM | POA: Diagnosis not present

## 2024-05-18 DIAGNOSIS — G4733 Obstructive sleep apnea (adult) (pediatric): Secondary | ICD-10-CM | POA: Diagnosis not present

## 2024-05-18 DIAGNOSIS — K219 Gastro-esophageal reflux disease without esophagitis: Secondary | ICD-10-CM | POA: Diagnosis not present

## 2024-05-18 DIAGNOSIS — J449 Chronic obstructive pulmonary disease, unspecified: Secondary | ICD-10-CM | POA: Diagnosis not present

## 2024-05-18 DIAGNOSIS — K51919 Ulcerative colitis, unspecified with unspecified complications: Secondary | ICD-10-CM | POA: Diagnosis not present

## 2024-05-18 DIAGNOSIS — E039 Hypothyroidism, unspecified: Secondary | ICD-10-CM | POA: Diagnosis not present

## 2024-05-18 DIAGNOSIS — I509 Heart failure, unspecified: Secondary | ICD-10-CM | POA: Diagnosis not present

## 2024-05-18 DIAGNOSIS — J45998 Other asthma: Secondary | ICD-10-CM | POA: Diagnosis not present

## 2024-05-18 DIAGNOSIS — M109 Gout, unspecified: Secondary | ICD-10-CM | POA: Diagnosis not present

## 2024-05-18 DIAGNOSIS — E785 Hyperlipidemia, unspecified: Secondary | ICD-10-CM | POA: Diagnosis not present

## 2024-05-18 DIAGNOSIS — F32A Depression, unspecified: Secondary | ICD-10-CM | POA: Diagnosis not present

## 2024-05-18 DIAGNOSIS — N182 Chronic kidney disease, stage 2 (mild): Secondary | ICD-10-CM | POA: Diagnosis not present

## 2024-05-19 DIAGNOSIS — E785 Hyperlipidemia, unspecified: Secondary | ICD-10-CM | POA: Diagnosis not present

## 2024-05-19 DIAGNOSIS — J45998 Other asthma: Secondary | ICD-10-CM | POA: Diagnosis not present

## 2024-05-19 DIAGNOSIS — J449 Chronic obstructive pulmonary disease, unspecified: Secondary | ICD-10-CM | POA: Diagnosis not present

## 2024-05-19 DIAGNOSIS — K219 Gastro-esophageal reflux disease without esophagitis: Secondary | ICD-10-CM | POA: Diagnosis not present

## 2024-05-19 DIAGNOSIS — I509 Heart failure, unspecified: Secondary | ICD-10-CM | POA: Diagnosis not present

## 2024-05-19 DIAGNOSIS — I480 Paroxysmal atrial fibrillation: Secondary | ICD-10-CM | POA: Diagnosis not present

## 2024-05-20 DIAGNOSIS — E785 Hyperlipidemia, unspecified: Secondary | ICD-10-CM | POA: Diagnosis not present

## 2024-05-20 DIAGNOSIS — J45998 Other asthma: Secondary | ICD-10-CM | POA: Diagnosis not present

## 2024-05-20 DIAGNOSIS — K219 Gastro-esophageal reflux disease without esophagitis: Secondary | ICD-10-CM | POA: Diagnosis not present

## 2024-05-20 DIAGNOSIS — I480 Paroxysmal atrial fibrillation: Secondary | ICD-10-CM | POA: Diagnosis not present

## 2024-05-20 DIAGNOSIS — I509 Heart failure, unspecified: Secondary | ICD-10-CM | POA: Diagnosis not present

## 2024-05-20 DIAGNOSIS — J449 Chronic obstructive pulmonary disease, unspecified: Secondary | ICD-10-CM | POA: Diagnosis not present

## 2024-05-26 DIAGNOSIS — F32A Depression, unspecified: Secondary | ICD-10-CM | POA: Diagnosis not present

## 2024-05-26 DIAGNOSIS — J45998 Other asthma: Secondary | ICD-10-CM | POA: Diagnosis not present

## 2024-05-26 DIAGNOSIS — N182 Chronic kidney disease, stage 2 (mild): Secondary | ICD-10-CM | POA: Diagnosis not present

## 2024-05-26 DIAGNOSIS — I480 Paroxysmal atrial fibrillation: Secondary | ICD-10-CM | POA: Diagnosis not present

## 2024-05-26 DIAGNOSIS — E039 Hypothyroidism, unspecified: Secondary | ICD-10-CM | POA: Diagnosis not present

## 2024-05-26 DIAGNOSIS — E785 Hyperlipidemia, unspecified: Secondary | ICD-10-CM | POA: Diagnosis not present

## 2024-05-26 DIAGNOSIS — K219 Gastro-esophageal reflux disease without esophagitis: Secondary | ICD-10-CM | POA: Diagnosis not present

## 2024-05-26 DIAGNOSIS — K51919 Ulcerative colitis, unspecified with unspecified complications: Secondary | ICD-10-CM | POA: Diagnosis not present

## 2024-05-26 DIAGNOSIS — M109 Gout, unspecified: Secondary | ICD-10-CM | POA: Diagnosis not present

## 2024-05-26 DIAGNOSIS — J449 Chronic obstructive pulmonary disease, unspecified: Secondary | ICD-10-CM | POA: Diagnosis not present

## 2024-05-26 DIAGNOSIS — G4733 Obstructive sleep apnea (adult) (pediatric): Secondary | ICD-10-CM | POA: Diagnosis not present

## 2024-05-26 DIAGNOSIS — I509 Heart failure, unspecified: Secondary | ICD-10-CM | POA: Diagnosis not present

## 2024-05-27 DIAGNOSIS — I480 Paroxysmal atrial fibrillation: Secondary | ICD-10-CM | POA: Diagnosis not present

## 2024-05-27 DIAGNOSIS — I509 Heart failure, unspecified: Secondary | ICD-10-CM | POA: Diagnosis not present

## 2024-05-27 DIAGNOSIS — K219 Gastro-esophageal reflux disease without esophagitis: Secondary | ICD-10-CM | POA: Diagnosis not present

## 2024-05-27 DIAGNOSIS — J45998 Other asthma: Secondary | ICD-10-CM | POA: Diagnosis not present

## 2024-05-27 DIAGNOSIS — J449 Chronic obstructive pulmonary disease, unspecified: Secondary | ICD-10-CM | POA: Diagnosis not present

## 2024-05-27 DIAGNOSIS — E785 Hyperlipidemia, unspecified: Secondary | ICD-10-CM | POA: Diagnosis not present

## 2024-05-29 DIAGNOSIS — J45998 Other asthma: Secondary | ICD-10-CM | POA: Diagnosis not present

## 2024-05-29 DIAGNOSIS — K219 Gastro-esophageal reflux disease without esophagitis: Secondary | ICD-10-CM | POA: Diagnosis not present

## 2024-05-29 DIAGNOSIS — I509 Heart failure, unspecified: Secondary | ICD-10-CM | POA: Diagnosis not present

## 2024-05-29 DIAGNOSIS — J449 Chronic obstructive pulmonary disease, unspecified: Secondary | ICD-10-CM | POA: Diagnosis not present

## 2024-05-29 DIAGNOSIS — E785 Hyperlipidemia, unspecified: Secondary | ICD-10-CM | POA: Diagnosis not present

## 2024-05-29 DIAGNOSIS — I480 Paroxysmal atrial fibrillation: Secondary | ICD-10-CM | POA: Diagnosis not present

## 2024-06-02 DIAGNOSIS — E785 Hyperlipidemia, unspecified: Secondary | ICD-10-CM | POA: Diagnosis not present

## 2024-06-02 DIAGNOSIS — I480 Paroxysmal atrial fibrillation: Secondary | ICD-10-CM | POA: Diagnosis not present

## 2024-06-02 DIAGNOSIS — I509 Heart failure, unspecified: Secondary | ICD-10-CM | POA: Diagnosis not present

## 2024-06-02 DIAGNOSIS — K219 Gastro-esophageal reflux disease without esophagitis: Secondary | ICD-10-CM | POA: Diagnosis not present

## 2024-06-02 DIAGNOSIS — J449 Chronic obstructive pulmonary disease, unspecified: Secondary | ICD-10-CM | POA: Diagnosis not present

## 2024-06-02 DIAGNOSIS — J45998 Other asthma: Secondary | ICD-10-CM | POA: Diagnosis not present

## 2024-06-03 DIAGNOSIS — E785 Hyperlipidemia, unspecified: Secondary | ICD-10-CM | POA: Diagnosis not present

## 2024-06-03 DIAGNOSIS — I480 Paroxysmal atrial fibrillation: Secondary | ICD-10-CM | POA: Diagnosis not present

## 2024-06-03 DIAGNOSIS — I509 Heart failure, unspecified: Secondary | ICD-10-CM | POA: Diagnosis not present

## 2024-06-03 DIAGNOSIS — K219 Gastro-esophageal reflux disease without esophagitis: Secondary | ICD-10-CM | POA: Diagnosis not present

## 2024-06-03 DIAGNOSIS — J449 Chronic obstructive pulmonary disease, unspecified: Secondary | ICD-10-CM | POA: Diagnosis not present

## 2024-06-03 DIAGNOSIS — J45998 Other asthma: Secondary | ICD-10-CM | POA: Diagnosis not present

## 2024-06-05 DIAGNOSIS — J45998 Other asthma: Secondary | ICD-10-CM | POA: Diagnosis not present

## 2024-06-05 DIAGNOSIS — J449 Chronic obstructive pulmonary disease, unspecified: Secondary | ICD-10-CM | POA: Diagnosis not present

## 2024-06-05 DIAGNOSIS — I480 Paroxysmal atrial fibrillation: Secondary | ICD-10-CM | POA: Diagnosis not present

## 2024-06-05 DIAGNOSIS — K219 Gastro-esophageal reflux disease without esophagitis: Secondary | ICD-10-CM | POA: Diagnosis not present

## 2024-06-05 DIAGNOSIS — I509 Heart failure, unspecified: Secondary | ICD-10-CM | POA: Diagnosis not present

## 2024-06-05 DIAGNOSIS — E785 Hyperlipidemia, unspecified: Secondary | ICD-10-CM | POA: Diagnosis not present

## 2024-06-09 DIAGNOSIS — J449 Chronic obstructive pulmonary disease, unspecified: Secondary | ICD-10-CM | POA: Diagnosis not present

## 2024-06-09 DIAGNOSIS — E785 Hyperlipidemia, unspecified: Secondary | ICD-10-CM | POA: Diagnosis not present

## 2024-06-09 DIAGNOSIS — J45998 Other asthma: Secondary | ICD-10-CM | POA: Diagnosis not present

## 2024-06-09 DIAGNOSIS — K219 Gastro-esophageal reflux disease without esophagitis: Secondary | ICD-10-CM | POA: Diagnosis not present

## 2024-06-09 DIAGNOSIS — I480 Paroxysmal atrial fibrillation: Secondary | ICD-10-CM | POA: Diagnosis not present

## 2024-06-09 DIAGNOSIS — I509 Heart failure, unspecified: Secondary | ICD-10-CM | POA: Diagnosis not present

## 2024-06-10 DIAGNOSIS — I480 Paroxysmal atrial fibrillation: Secondary | ICD-10-CM | POA: Diagnosis not present

## 2024-06-10 DIAGNOSIS — E785 Hyperlipidemia, unspecified: Secondary | ICD-10-CM | POA: Diagnosis not present

## 2024-06-10 DIAGNOSIS — J449 Chronic obstructive pulmonary disease, unspecified: Secondary | ICD-10-CM | POA: Diagnosis not present

## 2024-06-10 DIAGNOSIS — K219 Gastro-esophageal reflux disease without esophagitis: Secondary | ICD-10-CM | POA: Diagnosis not present

## 2024-06-10 DIAGNOSIS — J45998 Other asthma: Secondary | ICD-10-CM | POA: Diagnosis not present

## 2024-06-10 DIAGNOSIS — I509 Heart failure, unspecified: Secondary | ICD-10-CM | POA: Diagnosis not present

## 2024-06-11 DIAGNOSIS — E785 Hyperlipidemia, unspecified: Secondary | ICD-10-CM | POA: Diagnosis not present

## 2024-06-11 DIAGNOSIS — J449 Chronic obstructive pulmonary disease, unspecified: Secondary | ICD-10-CM | POA: Diagnosis not present

## 2024-06-11 DIAGNOSIS — I480 Paroxysmal atrial fibrillation: Secondary | ICD-10-CM | POA: Diagnosis not present

## 2024-06-11 DIAGNOSIS — K219 Gastro-esophageal reflux disease without esophagitis: Secondary | ICD-10-CM | POA: Diagnosis not present

## 2024-06-11 DIAGNOSIS — J45998 Other asthma: Secondary | ICD-10-CM | POA: Diagnosis not present

## 2024-06-11 DIAGNOSIS — I509 Heart failure, unspecified: Secondary | ICD-10-CM | POA: Diagnosis not present

## 2024-06-12 DIAGNOSIS — J45998 Other asthma: Secondary | ICD-10-CM | POA: Diagnosis not present

## 2024-06-12 DIAGNOSIS — K219 Gastro-esophageal reflux disease without esophagitis: Secondary | ICD-10-CM | POA: Diagnosis not present

## 2024-06-12 DIAGNOSIS — I480 Paroxysmal atrial fibrillation: Secondary | ICD-10-CM | POA: Diagnosis not present

## 2024-06-12 DIAGNOSIS — J449 Chronic obstructive pulmonary disease, unspecified: Secondary | ICD-10-CM | POA: Diagnosis not present

## 2024-06-12 DIAGNOSIS — I509 Heart failure, unspecified: Secondary | ICD-10-CM | POA: Diagnosis not present

## 2024-06-12 DIAGNOSIS — E785 Hyperlipidemia, unspecified: Secondary | ICD-10-CM | POA: Diagnosis not present

## 2024-06-13 DIAGNOSIS — E785 Hyperlipidemia, unspecified: Secondary | ICD-10-CM | POA: Diagnosis not present

## 2024-06-13 DIAGNOSIS — I509 Heart failure, unspecified: Secondary | ICD-10-CM | POA: Diagnosis not present

## 2024-06-13 DIAGNOSIS — J449 Chronic obstructive pulmonary disease, unspecified: Secondary | ICD-10-CM | POA: Diagnosis not present

## 2024-06-13 DIAGNOSIS — K219 Gastro-esophageal reflux disease without esophagitis: Secondary | ICD-10-CM | POA: Diagnosis not present

## 2024-06-13 DIAGNOSIS — I480 Paroxysmal atrial fibrillation: Secondary | ICD-10-CM | POA: Diagnosis not present

## 2024-06-13 DIAGNOSIS — J45998 Other asthma: Secondary | ICD-10-CM | POA: Diagnosis not present

## 2024-06-14 DIAGNOSIS — J45998 Other asthma: Secondary | ICD-10-CM | POA: Diagnosis not present

## 2024-06-14 DIAGNOSIS — J449 Chronic obstructive pulmonary disease, unspecified: Secondary | ICD-10-CM | POA: Diagnosis not present

## 2024-06-14 DIAGNOSIS — I509 Heart failure, unspecified: Secondary | ICD-10-CM | POA: Diagnosis not present

## 2024-06-14 DIAGNOSIS — K219 Gastro-esophageal reflux disease without esophagitis: Secondary | ICD-10-CM | POA: Diagnosis not present

## 2024-06-14 DIAGNOSIS — I480 Paroxysmal atrial fibrillation: Secondary | ICD-10-CM | POA: Diagnosis not present

## 2024-06-14 DIAGNOSIS — E785 Hyperlipidemia, unspecified: Secondary | ICD-10-CM | POA: Diagnosis not present

## 2024-06-15 DIAGNOSIS — I509 Heart failure, unspecified: Secondary | ICD-10-CM | POA: Diagnosis not present

## 2024-06-15 DIAGNOSIS — J45998 Other asthma: Secondary | ICD-10-CM | POA: Diagnosis not present

## 2024-06-15 DIAGNOSIS — E785 Hyperlipidemia, unspecified: Secondary | ICD-10-CM | POA: Diagnosis not present

## 2024-06-15 DIAGNOSIS — I480 Paroxysmal atrial fibrillation: Secondary | ICD-10-CM | POA: Diagnosis not present

## 2024-06-15 DIAGNOSIS — J449 Chronic obstructive pulmonary disease, unspecified: Secondary | ICD-10-CM | POA: Diagnosis not present

## 2024-06-15 DIAGNOSIS — K219 Gastro-esophageal reflux disease without esophagitis: Secondary | ICD-10-CM | POA: Diagnosis not present

## 2024-06-16 DIAGNOSIS — K219 Gastro-esophageal reflux disease without esophagitis: Secondary | ICD-10-CM | POA: Diagnosis not present

## 2024-06-16 DIAGNOSIS — I509 Heart failure, unspecified: Secondary | ICD-10-CM | POA: Diagnosis not present

## 2024-06-16 DIAGNOSIS — J449 Chronic obstructive pulmonary disease, unspecified: Secondary | ICD-10-CM | POA: Diagnosis not present

## 2024-06-16 DIAGNOSIS — E785 Hyperlipidemia, unspecified: Secondary | ICD-10-CM | POA: Diagnosis not present

## 2024-06-16 DIAGNOSIS — I480 Paroxysmal atrial fibrillation: Secondary | ICD-10-CM | POA: Diagnosis not present

## 2024-06-16 DIAGNOSIS — J45998 Other asthma: Secondary | ICD-10-CM | POA: Diagnosis not present

## 2024-06-26 DEATH — deceased
